# Patient Record
Sex: Male | Born: 1935
Health system: Southern US, Community
[De-identification: ages and names within clinical notes are randomized; demographics above are authoritative.]

## PROBLEM LIST (undated history)

## (undated) DIAGNOSIS — I739 Peripheral vascular disease, unspecified: Secondary | ICD-10-CM

## (undated) DIAGNOSIS — Z9109 Other allergy status, other than to drugs and biological substances: Secondary | ICD-10-CM

## (undated) DIAGNOSIS — Z951 Presence of aortocoronary bypass graft: Secondary | ICD-10-CM

## (undated) DIAGNOSIS — K222 Esophageal obstruction: Secondary | ICD-10-CM

## (undated) DIAGNOSIS — E785 Hyperlipidemia, unspecified: Secondary | ICD-10-CM

## (undated) DIAGNOSIS — H269 Unspecified cataract: Secondary | ICD-10-CM

## (undated) DIAGNOSIS — I2510 Atherosclerotic heart disease of native coronary artery without angina pectoris: Secondary | ICD-10-CM

## (undated) DIAGNOSIS — I498 Other specified cardiac arrhythmias: Secondary | ICD-10-CM

## (undated) DIAGNOSIS — E039 Hypothyroidism, unspecified: Secondary | ICD-10-CM

## (undated) DIAGNOSIS — G8929 Other chronic pain: Secondary | ICD-10-CM

## (undated) DIAGNOSIS — M1990 Unspecified osteoarthritis, unspecified site: Secondary | ICD-10-CM

## (undated) DIAGNOSIS — N189 Chronic kidney disease, unspecified: Secondary | ICD-10-CM

## (undated) DIAGNOSIS — I4892 Unspecified atrial flutter: Secondary | ICD-10-CM

## (undated) DIAGNOSIS — D509 Iron deficiency anemia, unspecified: Secondary | ICD-10-CM

## (undated) DIAGNOSIS — R972 Elevated prostate specific antigen [PSA]: Secondary | ICD-10-CM

## (undated) DIAGNOSIS — J439 Emphysema, unspecified: Secondary | ICD-10-CM

## (undated) DIAGNOSIS — N259 Disorder resulting from impaired renal tubular function, unspecified: Secondary | ICD-10-CM

## (undated) DIAGNOSIS — Z8719 Personal history of other diseases of the digestive system: Secondary | ICD-10-CM

## (undated) DIAGNOSIS — I10 Essential (primary) hypertension: Secondary | ICD-10-CM

## (undated) DIAGNOSIS — F419 Anxiety disorder, unspecified: Secondary | ICD-10-CM

## (undated) DIAGNOSIS — M549 Dorsalgia, unspecified: Secondary | ICD-10-CM

## (undated) DIAGNOSIS — Z86010 Personal history of colonic polyps: Secondary | ICD-10-CM

## (undated) DIAGNOSIS — Z87442 Personal history of urinary calculi: Secondary | ICD-10-CM

## (undated) DIAGNOSIS — I4891 Unspecified atrial fibrillation: Secondary | ICD-10-CM

## (undated) DIAGNOSIS — M109 Gout, unspecified: Secondary | ICD-10-CM

## (undated) DIAGNOSIS — I219 Acute myocardial infarction, unspecified: Secondary | ICD-10-CM

## (undated) DIAGNOSIS — I714 Abdominal aortic aneurysm, without rupture: Secondary | ICD-10-CM

## (undated) DIAGNOSIS — N529 Male erectile dysfunction, unspecified: Secondary | ICD-10-CM

## (undated) DIAGNOSIS — I200 Unstable angina: Secondary | ICD-10-CM

## (undated) DIAGNOSIS — J449 Chronic obstructive pulmonary disease, unspecified: Secondary | ICD-10-CM

## (undated) DIAGNOSIS — Z9289 Personal history of other medical treatment: Secondary | ICD-10-CM

## (undated) DIAGNOSIS — K219 Gastro-esophageal reflux disease without esophagitis: Secondary | ICD-10-CM

## (undated) DIAGNOSIS — K5730 Diverticulosis of large intestine without perforation or abscess without bleeding: Secondary | ICD-10-CM

## (undated) HISTORY — DX: Iron deficiency anemia, unspecified: D50.9

## (undated) HISTORY — PX: COLONOSCOPY W/ BIOPSIES AND POLYPECTOMY: SHX1376

## (undated) HISTORY — DX: Peripheral vascular disease, unspecified: I73.9

## (undated) HISTORY — DX: Atherosclerotic heart disease of native coronary artery without angina pectoris: I25.10

## (undated) HISTORY — DX: Unspecified cataract: H26.9

## (undated) HISTORY — DX: Diverticulosis of large intestine without perforation or abscess without bleeding: K57.30

## (undated) HISTORY — DX: Unspecified atrial fibrillation: I48.91

## (undated) HISTORY — DX: Unspecified atrial flutter: I48.92

## (undated) HISTORY — DX: Acute myocardial infarction, unspecified: I21.9

## (undated) HISTORY — DX: Elevated prostate specific antigen (PSA): R97.20

## (undated) HISTORY — DX: Personal history of other diseases of the digestive system: Z87.19

## (undated) HISTORY — DX: Chronic kidney disease, unspecified: N18.9

## (undated) HISTORY — DX: Abdominal aortic aneurysm, without rupture: I71.4

## (undated) HISTORY — DX: Gastro-esophageal reflux disease without esophagitis: K21.9

## (undated) HISTORY — DX: Disorder resulting from impaired renal tubular function, unspecified: N25.9

## (undated) HISTORY — DX: Other specified cardiac arrhythmias: I49.8

## (undated) HISTORY — DX: Male erectile dysfunction, unspecified: N52.9

## (undated) HISTORY — DX: Presence of aortocoronary bypass graft: Z95.1

## (undated) HISTORY — PX: MULTIPLE TOOTH EXTRACTIONS: SHX2053

## (undated) HISTORY — PX: ESOPHAGOGASTRODUODENOSCOPY (EGD) WITH ESOPHAGEAL DILATION: SHX5812

## (undated) HISTORY — DX: Gout, unspecified: M10.9

## (undated) HISTORY — DX: Hyperlipidemia, unspecified: E78.5

## (undated) HISTORY — DX: Personal history of colonic polyps: Z86.010

## (undated) HISTORY — DX: Essential (primary) hypertension: I10

---
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
## 1998-06-12 ENCOUNTER — Ambulatory Visit (HOSPITAL_COMMUNITY): Admission: RE | Admit: 1998-06-12 | Discharge: 1998-06-12 | Payer: Self-pay | Admitting: Family Medicine

## 1999-10-09 ENCOUNTER — Ambulatory Visit (HOSPITAL_COMMUNITY): Admission: RE | Admit: 1999-10-09 | Discharge: 1999-10-09 | Payer: Self-pay | Admitting: Urology

## 1999-10-09 ENCOUNTER — Encounter: Payer: Self-pay | Admitting: Urology

## 1999-11-06 ENCOUNTER — Other Ambulatory Visit: Admission: RE | Admit: 1999-11-06 | Discharge: 1999-11-06 | Payer: Self-pay | Admitting: Gastroenterology

## 1999-11-06 ENCOUNTER — Encounter (INDEPENDENT_AMBULATORY_CARE_PROVIDER_SITE_OTHER): Payer: Self-pay | Admitting: Specialist

## 2000-05-29 ENCOUNTER — Emergency Department (HOSPITAL_COMMUNITY): Admission: EM | Admit: 2000-05-29 | Discharge: 2000-05-29 | Payer: Self-pay | Admitting: Emergency Medicine

## 2000-06-22 ENCOUNTER — Encounter: Admission: RE | Admit: 2000-06-22 | Discharge: 2000-06-22 | Payer: Self-pay | Admitting: Family Medicine

## 2000-06-22 ENCOUNTER — Encounter: Payer: Self-pay | Admitting: Family Medicine

## 2001-01-24 ENCOUNTER — Encounter: Admission: RE | Admit: 2001-01-24 | Discharge: 2001-01-24 | Payer: Self-pay | Admitting: Family Medicine

## 2001-01-24 ENCOUNTER — Encounter: Payer: Self-pay | Admitting: Family Medicine

## 2002-05-18 HISTORY — PX: ATRIAL FIBRILLATION ABLATION: EP1191

## 2002-05-18 HISTORY — PX: CORONARY ARTERY BYPASS GRAFT: SHX141

## 2003-03-05 ENCOUNTER — Inpatient Hospital Stay (HOSPITAL_COMMUNITY): Admission: AD | Admit: 2003-03-05 | Discharge: 2003-03-15 | Payer: Self-pay | Admitting: *Deleted

## 2003-03-05 ENCOUNTER — Encounter: Payer: Self-pay | Admitting: *Deleted

## 2003-03-07 ENCOUNTER — Encounter: Payer: Self-pay | Admitting: Thoracic Surgery (Cardiothoracic Vascular Surgery)

## 2003-03-08 ENCOUNTER — Encounter: Payer: Self-pay | Admitting: Thoracic Surgery (Cardiothoracic Vascular Surgery)

## 2003-03-09 ENCOUNTER — Encounter: Payer: Self-pay | Admitting: Thoracic Surgery (Cardiothoracic Vascular Surgery)

## 2003-03-10 ENCOUNTER — Encounter: Payer: Self-pay | Admitting: Thoracic Surgery (Cardiothoracic Vascular Surgery)

## 2003-03-20 ENCOUNTER — Inpatient Hospital Stay (HOSPITAL_COMMUNITY): Admission: EM | Admit: 2003-03-20 | Discharge: 2003-03-24 | Payer: Self-pay | Admitting: Emergency Medicine

## 2003-04-30 ENCOUNTER — Encounter
Admission: RE | Admit: 2003-04-30 | Discharge: 2003-04-30 | Payer: Self-pay | Admitting: Thoracic Surgery (Cardiothoracic Vascular Surgery)

## 2003-05-07 ENCOUNTER — Encounter (HOSPITAL_COMMUNITY): Admission: RE | Admit: 2003-05-07 | Discharge: 2003-08-05 | Payer: Self-pay | Admitting: *Deleted

## 2003-08-06 ENCOUNTER — Encounter (HOSPITAL_COMMUNITY): Admission: RE | Admit: 2003-08-06 | Discharge: 2003-08-17 | Payer: Self-pay | Admitting: *Deleted

## 2003-08-21 ENCOUNTER — Encounter (HOSPITAL_COMMUNITY): Admission: RE | Admit: 2003-08-21 | Discharge: 2003-11-19 | Payer: Self-pay | Admitting: *Deleted

## 2004-04-14 ENCOUNTER — Ambulatory Visit: Payer: Self-pay | Admitting: Internal Medicine

## 2004-05-14 ENCOUNTER — Ambulatory Visit: Payer: Self-pay | Admitting: Internal Medicine

## 2004-06-11 ENCOUNTER — Ambulatory Visit: Payer: Self-pay | Admitting: Cardiology

## 2004-07-03 ENCOUNTER — Ambulatory Visit: Payer: Self-pay | Admitting: *Deleted

## 2004-07-31 ENCOUNTER — Ambulatory Visit: Payer: Self-pay | Admitting: Cardiology

## 2004-08-21 ENCOUNTER — Ambulatory Visit: Payer: Self-pay | Admitting: Internal Medicine

## 2004-09-04 ENCOUNTER — Ambulatory Visit: Payer: Self-pay | Admitting: Cardiology

## 2004-09-17 ENCOUNTER — Ambulatory Visit: Payer: Self-pay | Admitting: *Deleted

## 2004-10-15 ENCOUNTER — Ambulatory Visit: Payer: Self-pay | Admitting: Cardiology

## 2004-11-05 ENCOUNTER — Ambulatory Visit: Payer: Self-pay | Admitting: *Deleted

## 2004-12-10 ENCOUNTER — Ambulatory Visit: Payer: Self-pay | Admitting: Cardiology

## 2004-12-10 ENCOUNTER — Ambulatory Visit: Payer: Self-pay | Admitting: Internal Medicine

## 2004-12-16 ENCOUNTER — Ambulatory Visit: Payer: Self-pay | Admitting: *Deleted

## 2004-12-17 ENCOUNTER — Ambulatory Visit: Payer: Self-pay | Admitting: Internal Medicine

## 2005-01-08 ENCOUNTER — Ambulatory Visit: Payer: Self-pay | Admitting: Cardiology

## 2005-01-29 ENCOUNTER — Ambulatory Visit: Payer: Self-pay | Admitting: Internal Medicine

## 2005-02-26 ENCOUNTER — Ambulatory Visit: Payer: Self-pay | Admitting: Cardiology

## 2005-03-26 ENCOUNTER — Ambulatory Visit: Payer: Self-pay | Admitting: Cardiology

## 2005-04-16 ENCOUNTER — Ambulatory Visit: Payer: Self-pay | Admitting: Cardiology

## 2005-05-07 ENCOUNTER — Ambulatory Visit: Payer: Self-pay | Admitting: Cardiology

## 2005-05-13 ENCOUNTER — Ambulatory Visit: Payer: Self-pay | Admitting: *Deleted

## 2005-05-28 ENCOUNTER — Ambulatory Visit: Payer: Self-pay | Admitting: Cardiology

## 2005-06-10 ENCOUNTER — Ambulatory Visit: Payer: Self-pay | Admitting: Internal Medicine

## 2005-06-25 ENCOUNTER — Ambulatory Visit: Payer: Self-pay | Admitting: *Deleted

## 2005-07-23 ENCOUNTER — Ambulatory Visit: Payer: Self-pay | Admitting: Cardiology

## 2005-08-26 ENCOUNTER — Ambulatory Visit: Payer: Self-pay | Admitting: *Deleted

## 2005-09-23 ENCOUNTER — Ambulatory Visit: Payer: Self-pay | Admitting: Cardiology

## 2005-10-02 ENCOUNTER — Ambulatory Visit: Payer: Self-pay | Admitting: Gastroenterology

## 2005-10-07 ENCOUNTER — Ambulatory Visit: Payer: Self-pay | Admitting: Gastroenterology

## 2005-10-07 ENCOUNTER — Encounter (INDEPENDENT_AMBULATORY_CARE_PROVIDER_SITE_OTHER): Payer: Self-pay | Admitting: *Deleted

## 2005-10-08 ENCOUNTER — Ambulatory Visit: Payer: Self-pay | Admitting: Gastroenterology

## 2005-10-08 ENCOUNTER — Ambulatory Visit: Payer: Self-pay | Admitting: Internal Medicine

## 2005-11-05 ENCOUNTER — Ambulatory Visit: Payer: Self-pay | Admitting: Gastroenterology

## 2005-11-11 ENCOUNTER — Ambulatory Visit: Payer: Self-pay | Admitting: Gastroenterology

## 2005-11-12 ENCOUNTER — Ambulatory Visit: Payer: Self-pay | Admitting: Gastroenterology

## 2005-11-27 ENCOUNTER — Ambulatory Visit (HOSPITAL_COMMUNITY): Admission: RE | Admit: 2005-11-27 | Discharge: 2005-11-27 | Payer: Self-pay | Admitting: Gastroenterology

## 2005-12-11 ENCOUNTER — Ambulatory Visit: Payer: Self-pay | Admitting: Internal Medicine

## 2005-12-15 ENCOUNTER — Ambulatory Visit: Payer: Self-pay | Admitting: Gastroenterology

## 2006-03-26 ENCOUNTER — Ambulatory Visit: Payer: Self-pay | Admitting: *Deleted

## 2006-03-31 ENCOUNTER — Ambulatory Visit: Payer: Self-pay | Admitting: *Deleted

## 2006-03-31 ENCOUNTER — Ambulatory Visit: Payer: Self-pay | Admitting: Internal Medicine

## 2006-04-01 ENCOUNTER — Ambulatory Visit: Payer: Self-pay | Admitting: *Deleted

## 2007-01-13 ENCOUNTER — Observation Stay (HOSPITAL_COMMUNITY): Admission: EM | Admit: 2007-01-13 | Discharge: 2007-01-14 | Payer: Self-pay | Admitting: Emergency Medicine

## 2007-01-13 ENCOUNTER — Ambulatory Visit: Payer: Self-pay | Admitting: Internal Medicine

## 2007-01-24 ENCOUNTER — Ambulatory Visit: Payer: Self-pay

## 2007-01-31 ENCOUNTER — Ambulatory Visit: Payer: Self-pay | Admitting: Internal Medicine

## 2007-03-28 ENCOUNTER — Encounter: Admission: RE | Admit: 2007-03-28 | Discharge: 2007-03-28 | Payer: Self-pay | Admitting: Family Medicine

## 2008-01-05 ENCOUNTER — Encounter: Payer: Self-pay | Admitting: Internal Medicine

## 2008-01-05 LAB — CONVERTED CEMR LAB: PSA: 3.85 ng/mL

## 2008-02-16 LAB — CONVERTED CEMR LAB: PSA: 2.14 ng/mL

## 2008-02-17 ENCOUNTER — Ambulatory Visit: Payer: Self-pay | Admitting: Internal Medicine

## 2008-04-06 DIAGNOSIS — R0789 Other chest pain: Secondary | ICD-10-CM

## 2008-04-11 ENCOUNTER — Encounter: Payer: Self-pay | Admitting: Nurse Practitioner

## 2008-04-18 ENCOUNTER — Telehealth: Payer: Self-pay | Admitting: Gastroenterology

## 2008-04-19 ENCOUNTER — Ambulatory Visit: Payer: Self-pay | Admitting: Gastroenterology

## 2008-04-19 DIAGNOSIS — Z9289 Personal history of other medical treatment: Secondary | ICD-10-CM

## 2008-04-19 DIAGNOSIS — Z8719 Personal history of other diseases of the digestive system: Secondary | ICD-10-CM

## 2008-04-19 DIAGNOSIS — I480 Paroxysmal atrial fibrillation: Secondary | ICD-10-CM

## 2008-04-19 DIAGNOSIS — I4891 Unspecified atrial fibrillation: Secondary | ICD-10-CM

## 2008-04-19 HISTORY — DX: Unspecified atrial fibrillation: I48.91

## 2008-04-19 HISTORY — DX: Personal history of other diseases of the digestive system: Z87.19

## 2008-04-19 HISTORY — DX: Personal history of other medical treatment: Z92.89

## 2008-04-23 ENCOUNTER — Telehealth: Payer: Self-pay | Admitting: Nurse Practitioner

## 2008-04-24 ENCOUNTER — Telehealth: Payer: Self-pay | Admitting: Gastroenterology

## 2008-04-25 ENCOUNTER — Ambulatory Visit: Payer: Self-pay | Admitting: Gastroenterology

## 2008-04-30 ENCOUNTER — Ambulatory Visit: Payer: Self-pay | Admitting: Gastroenterology

## 2008-05-03 ENCOUNTER — Ambulatory Visit: Payer: Self-pay | Admitting: Gastroenterology

## 2008-05-03 DIAGNOSIS — R1013 Epigastric pain: Secondary | ICD-10-CM

## 2008-05-04 ENCOUNTER — Telehealth: Payer: Self-pay | Admitting: Gastroenterology

## 2008-05-16 ENCOUNTER — Other Ambulatory Visit: Admission: RE | Admit: 2008-05-16 | Discharge: 2008-05-16 | Payer: Self-pay | Admitting: Otolaryngology

## 2008-05-17 ENCOUNTER — Ambulatory Visit (HOSPITAL_COMMUNITY): Admission: RE | Admit: 2008-05-17 | Discharge: 2008-05-17 | Payer: Self-pay | Admitting: Gastroenterology

## 2008-05-25 ENCOUNTER — Telehealth: Payer: Self-pay | Admitting: Gastroenterology

## 2008-09-17 ENCOUNTER — Telehealth: Payer: Self-pay | Admitting: Internal Medicine

## 2008-09-19 ENCOUNTER — Ambulatory Visit: Payer: Self-pay | Admitting: Internal Medicine

## 2008-09-19 DIAGNOSIS — N183 Chronic kidney disease, stage 3 unspecified: Secondary | ICD-10-CM | POA: Insufficient documentation

## 2008-09-19 DIAGNOSIS — Z860100 Personal history of colon polyps, unspecified: Secondary | ICD-10-CM | POA: Insufficient documentation

## 2008-09-19 DIAGNOSIS — K219 Gastro-esophageal reflux disease without esophagitis: Secondary | ICD-10-CM

## 2008-09-19 DIAGNOSIS — D509 Iron deficiency anemia, unspecified: Secondary | ICD-10-CM

## 2008-09-19 DIAGNOSIS — I714 Abdominal aortic aneurysm, without rupture, unspecified: Secondary | ICD-10-CM

## 2008-09-19 DIAGNOSIS — M109 Gout, unspecified: Secondary | ICD-10-CM

## 2008-09-19 DIAGNOSIS — M545 Low back pain, unspecified: Secondary | ICD-10-CM | POA: Insufficient documentation

## 2008-09-19 DIAGNOSIS — I10 Essential (primary) hypertension: Secondary | ICD-10-CM | POA: Insufficient documentation

## 2008-09-19 DIAGNOSIS — K5792 Diverticulitis of intestine, part unspecified, without perforation or abscess without bleeding: Secondary | ICD-10-CM | POA: Insufficient documentation

## 2008-09-19 DIAGNOSIS — I739 Peripheral vascular disease, unspecified: Secondary | ICD-10-CM | POA: Insufficient documentation

## 2008-09-19 DIAGNOSIS — E785 Hyperlipidemia, unspecified: Secondary | ICD-10-CM

## 2008-09-19 DIAGNOSIS — N189 Chronic kidney disease, unspecified: Secondary | ICD-10-CM

## 2008-09-19 DIAGNOSIS — N259 Disorder resulting from impaired renal tubular function, unspecified: Secondary | ICD-10-CM

## 2008-09-19 DIAGNOSIS — R972 Elevated prostate specific antigen [PSA]: Secondary | ICD-10-CM

## 2008-09-19 DIAGNOSIS — K5730 Diverticulosis of large intestine without perforation or abscess without bleeding: Secondary | ICD-10-CM

## 2008-09-19 HISTORY — DX: Abdominal aortic aneurysm, without rupture: I71.4

## 2008-09-19 HISTORY — DX: Chronic kidney disease, unspecified: N18.9

## 2008-09-19 HISTORY — DX: Essential (primary) hypertension: I10

## 2008-09-19 HISTORY — DX: Personal history of colon polyps, unspecified: Z86.0100

## 2008-09-19 HISTORY — DX: Peripheral vascular disease, unspecified: I73.9

## 2008-09-19 HISTORY — DX: Hyperlipidemia, unspecified: E78.5

## 2008-09-19 HISTORY — DX: Iron deficiency anemia, unspecified: D50.9

## 2008-09-19 HISTORY — DX: Diverticulosis of large intestine without perforation or abscess without bleeding: K57.30

## 2008-09-19 HISTORY — DX: Abdominal aortic aneurysm, without rupture, unspecified: I71.40

## 2008-09-19 HISTORY — DX: Elevated prostate specific antigen (PSA): R97.20

## 2008-09-19 HISTORY — DX: Disorder resulting from impaired renal tubular function, unspecified: N25.9

## 2008-09-19 HISTORY — DX: Gout, unspecified: M10.9

## 2008-09-19 HISTORY — DX: Gastro-esophageal reflux disease without esophagitis: K21.9

## 2008-09-19 HISTORY — DX: Personal history of colonic polyps: Z86.010

## 2008-09-19 LAB — CONVERTED CEMR LAB
CO2: 30 meq/L (ref 19–32)
Calcium: 9.3 mg/dL (ref 8.4–10.5)
Chloride: 110 meq/L (ref 96–112)
HDL: 36.4 mg/dL — ABNORMAL LOW (ref >39.00)
LDL Cholesterol: 90 mg/dL (ref 0–99)
Sodium: 143 meq/L (ref 135–145)
Total CHOL/HDL Ratio: 5

## 2008-09-26 ENCOUNTER — Encounter: Payer: Self-pay | Admitting: Internal Medicine

## 2008-09-26 ENCOUNTER — Ambulatory Visit: Payer: Self-pay

## 2008-12-05 DIAGNOSIS — E079 Disorder of thyroid, unspecified: Secondary | ICD-10-CM | POA: Insufficient documentation

## 2008-12-05 DIAGNOSIS — I2510 Atherosclerotic heart disease of native coronary artery without angina pectoris: Secondary | ICD-10-CM

## 2008-12-05 DIAGNOSIS — E039 Hypothyroidism, unspecified: Secondary | ICD-10-CM

## 2008-12-05 DIAGNOSIS — I4892 Unspecified atrial flutter: Secondary | ICD-10-CM

## 2008-12-05 DIAGNOSIS — I498 Other specified cardiac arrhythmias: Secondary | ICD-10-CM

## 2008-12-05 DIAGNOSIS — I495 Sick sinus syndrome: Secondary | ICD-10-CM | POA: Insufficient documentation

## 2008-12-05 DIAGNOSIS — Z951 Presence of aortocoronary bypass graft: Secondary | ICD-10-CM

## 2008-12-05 HISTORY — DX: Hypothyroidism, unspecified: E03.9

## 2008-12-05 HISTORY — DX: Presence of aortocoronary bypass graft: Z95.1

## 2008-12-05 HISTORY — DX: Unspecified atrial flutter: I48.92

## 2008-12-05 HISTORY — DX: Atherosclerotic heart disease of native coronary artery without angina pectoris: I25.10

## 2008-12-05 HISTORY — DX: Other specified cardiac arrhythmias: I49.8

## 2008-12-06 ENCOUNTER — Ambulatory Visit: Payer: Self-pay | Admitting: Internal Medicine

## 2009-01-11 ENCOUNTER — Ambulatory Visit: Payer: Self-pay | Admitting: Cardiovascular Disease

## 2009-01-11 ENCOUNTER — Ambulatory Visit: Payer: Self-pay | Admitting: Internal Medicine

## 2009-01-11 DIAGNOSIS — Z87442 Personal history of urinary calculi: Secondary | ICD-10-CM | POA: Insufficient documentation

## 2009-01-11 DIAGNOSIS — R109 Unspecified abdominal pain: Secondary | ICD-10-CM

## 2009-01-11 LAB — CONVERTED CEMR LAB
ALT: 20 units/L (ref 0–53)
AST: 21 units/L (ref 0–37)
Albumin: 3.9 g/dL (ref 3.5–5.2)
BUN: 16 mg/dL (ref 6–23)
Basophils Relative: 0.3 % (ref 0.0–3.0)
Bilirubin Urine: NEGATIVE
Chloride: 108 meq/L (ref 96–112)
Eosinophils Relative: 2 % (ref 0.0–5.0)
HCT: 43.8 % (ref 39.0–52.0)
Hemoglobin, Urine: NEGATIVE
Hemoglobin: 14.8 g/dL (ref 13.0–17.0)
Lymphs Abs: 0.7 10*3/uL (ref 0.7–4.0)
MCV: 92.9 fL (ref 78.0–100.0)
Monocytes Absolute: 0.5 10*3/uL (ref 0.1–1.0)
Neutro Abs: 4.9 10*3/uL (ref 1.4–7.7)
Nitrite: NEGATIVE
Platelets: 174 10*3/uL (ref 150.0–400.0)
Potassium: 4.4 meq/L (ref 3.5–5.1)
Sodium: 140 meq/L (ref 135–145)
Total Protein: 7.1 g/dL (ref 6.0–8.3)
Urobilinogen, UA: 0.2 (ref 0.0–1.0)
WBC: 6.2 10*3/uL (ref 4.5–10.5)

## 2009-01-12 ENCOUNTER — Ambulatory Visit: Payer: Self-pay | Admitting: Internal Medicine

## 2009-01-12 ENCOUNTER — Telehealth: Payer: Self-pay | Admitting: Internal Medicine

## 2009-01-22 ENCOUNTER — Ambulatory Visit: Payer: Self-pay | Admitting: Internal Medicine

## 2009-01-22 LAB — CONVERTED CEMR LAB
Beta Globulin: 7.4 % — ABNORMAL HIGH (ref 4.7–7.2)
Gamma Globulin: 17.2 % (ref 11.1–18.8)

## 2009-01-24 ENCOUNTER — Telehealth: Payer: Self-pay | Admitting: Internal Medicine

## 2009-02-05 ENCOUNTER — Telehealth: Payer: Self-pay | Admitting: Internal Medicine

## 2009-03-11 ENCOUNTER — Ambulatory Visit: Payer: Self-pay | Admitting: Internal Medicine

## 2009-04-17 ENCOUNTER — Encounter: Payer: Self-pay | Admitting: Internal Medicine

## 2009-06-07 ENCOUNTER — Telehealth: Payer: Self-pay | Admitting: Internal Medicine

## 2009-06-25 ENCOUNTER — Ambulatory Visit: Payer: Self-pay | Admitting: Internal Medicine

## 2009-08-22 ENCOUNTER — Ambulatory Visit: Payer: Self-pay | Admitting: Internal Medicine

## 2009-09-16 ENCOUNTER — Encounter: Payer: Self-pay | Admitting: Internal Medicine

## 2009-09-18 ENCOUNTER — Telehealth (INDEPENDENT_AMBULATORY_CARE_PROVIDER_SITE_OTHER): Payer: Self-pay | Admitting: *Deleted

## 2009-10-08 ENCOUNTER — Encounter: Payer: Self-pay | Admitting: Internal Medicine

## 2009-10-21 ENCOUNTER — Ambulatory Visit: Payer: Self-pay

## 2009-10-21 ENCOUNTER — Encounter: Payer: Self-pay | Admitting: Internal Medicine

## 2009-12-10 ENCOUNTER — Ambulatory Visit: Payer: Self-pay | Admitting: Internal Medicine

## 2009-12-11 LAB — CONVERTED CEMR LAB
ALT: 12 units/L (ref 0–53)
AST: 15 units/L (ref 0–37)
BUN: 19 mg/dL (ref 6–23)
Basophils Relative: 0.6 % (ref 0.0–3.0)
Bilirubin, Direct: 0.2 mg/dL (ref 0.0–0.3)
Chloride: 103 meq/L (ref 96–112)
Creatinine, Ser: 1.4 mg/dL (ref 0.4–1.5)
Direct LDL: 114.5 mg/dL
Eosinophils Relative: 4.1 % (ref 0.0–5.0)
GFR calc non Af Amer: 53.2 mL/min (ref >60)
HCT: 41 % (ref 39.0–52.0)
Hemoglobin, Urine: NEGATIVE
Leukocytes, UA: NEGATIVE
MCV: 94.8 fL (ref 78.0–100.0)
Monocytes Absolute: 0.4 10*3/uL (ref 0.1–1.0)
Monocytes Relative: 8.9 % (ref 3.0–12.0)
Neutrophils Relative %: 62.5 % (ref 43.0–77.0)
Nitrite: NEGATIVE
PSA: 2.05 ng/mL (ref 0.10–4.00)
Platelets: 227 10*3/uL (ref 150.0–400.0)
Potassium: 4.9 meq/L (ref 3.5–5.1)
RBC: 4.33 M/uL (ref 4.22–5.81)
Specific Gravity, Urine: 1.015 (ref 1.000–1.030)
TSH: 1.6 microintl units/mL (ref 0.35–5.50)
Total Bilirubin: 0.7 mg/dL (ref 0.3–1.2)
Total CHOL/HDL Ratio: 5
Total Protein: 7.2 g/dL (ref 6.0–8.3)
Urobilinogen, UA: 0.2 (ref 0.0–1.0)
WBC: 4.3 10*3/uL — ABNORMAL LOW (ref 4.5–10.5)

## 2010-03-03 ENCOUNTER — Ambulatory Visit: Payer: Self-pay | Admitting: Internal Medicine

## 2010-03-03 ENCOUNTER — Encounter: Payer: Self-pay | Admitting: Internal Medicine

## 2010-03-03 DIAGNOSIS — R079 Chest pain, unspecified: Secondary | ICD-10-CM

## 2010-03-03 DIAGNOSIS — M549 Dorsalgia, unspecified: Secondary | ICD-10-CM | POA: Insufficient documentation

## 2010-06-02 ENCOUNTER — Observation Stay (HOSPITAL_COMMUNITY)
Admission: EM | Admit: 2010-06-02 | Discharge: 2010-06-03 | Payer: Self-pay | Source: Home / Self Care | Attending: Internal Medicine | Admitting: Internal Medicine

## 2010-06-03 ENCOUNTER — Encounter (INDEPENDENT_AMBULATORY_CARE_PROVIDER_SITE_OTHER): Payer: Self-pay | Admitting: Internal Medicine

## 2010-06-04 LAB — BASIC METABOLIC PANEL
BUN: 20 mg/dL (ref 6–23)
CO2: 24 mEq/L (ref 19–32)
Calcium: 9.2 mg/dL (ref 8.4–10.5)
Chloride: 102 mEq/L (ref 96–112)
Creatinine, Ser: 1.51 mg/dL — ABNORMAL HIGH (ref 0.4–1.5)
GFR calc Af Amer: 55 mL/min — ABNORMAL LOW (ref >60)
GFR calc non Af Amer: 46 mL/min — ABNORMAL LOW (ref >60)
Glucose, Bld: 112 mg/dL — ABNORMAL HIGH (ref 70–99)
Potassium: 3.7 mEq/L (ref 3.5–5.1)
Sodium: 135 mEq/L (ref 135–145)

## 2010-06-04 LAB — COMPREHENSIVE METABOLIC PANEL
ALT: 13 U/L (ref 0–53)
AST: 16 U/L (ref 0–37)
Albumin: 3.4 g/dL — ABNORMAL LOW (ref 3.5–5.2)
Alkaline Phosphatase: 87 U/L (ref 39–117)
BUN: 21 mg/dL (ref 6–23)
CO2: 27 mEq/L (ref 19–32)
Calcium: 9.1 mg/dL (ref 8.4–10.5)
Chloride: 106 mEq/L (ref 96–112)
Creatinine, Ser: 1.75 mg/dL — ABNORMAL HIGH (ref 0.4–1.5)
GFR calc Af Amer: 46 mL/min — ABNORMAL LOW (ref >60)
GFR calc non Af Amer: 38 mL/min — ABNORMAL LOW (ref >60)
Glucose, Bld: 107 mg/dL — ABNORMAL HIGH (ref 70–99)
Potassium: 4.3 mEq/L (ref 3.5–5.1)
Sodium: 139 mEq/L (ref 135–145)
Total Bilirubin: 0.7 mg/dL (ref 0.3–1.2)
Total Protein: 6.4 g/dL (ref 6.0–8.3)

## 2010-06-04 LAB — DIFFERENTIAL
Basophils Absolute: 0 10*3/uL (ref 0.0–0.1)
Basophils Relative: 0 % (ref 0–1)
Eosinophils Absolute: 0.3 10*3/uL (ref 0.0–0.7)
Eosinophils Relative: 6 % — ABNORMAL HIGH (ref 0–5)
Lymphocytes Relative: 29 % (ref 12–46)
Lymphs Abs: 1.4 10*3/uL (ref 0.7–4.0)
Monocytes Absolute: 0.5 10*3/uL (ref 0.1–1.0)
Monocytes Relative: 10 % (ref 3–12)
Neutro Abs: 2.6 10*3/uL (ref 1.7–7.7)
Neutrophils Relative %: 55 % (ref 43–77)

## 2010-06-04 LAB — TSH: TSH: 3.698 u[IU]/mL (ref 0.350–4.500)

## 2010-06-04 LAB — CARDIAC PANEL(CRET KIN+CKTOT+MB+TROPI)
CK, MB: 1.4 ng/mL (ref 0.3–4.0)
CK, MB: 1.5 ng/mL (ref 0.3–4.0)
CK, MB: 1.2 ng/mL (ref 0.3–4.0)
Relative Index: INVALID (ref 0.0–2.5)
Relative Index: INVALID (ref 0.0–2.5)
Relative Index: INVALID (ref 0.0–2.5)
Total CK: 66 U/L (ref 7–232)
Total CK: 66 U/L (ref 7–232)
Total CK: 64 U/L (ref 7–232)
Troponin I: <0.01 ng/mL (ref 0.00–0.06)
Troponin I: <0.01 ng/mL (ref 0.00–0.06)
Troponin I: 0.01 ng/mL (ref 0.00–0.06)

## 2010-06-04 LAB — CBC
HCT: 39.3 % (ref 39.0–52.0)
HCT: 39.5 % (ref 39.0–52.0)
Hemoglobin: 12.8 g/dL — ABNORMAL LOW (ref 13.0–17.0)
Hemoglobin: 13.1 g/dL (ref 13.0–17.0)
MCH: 30.1 pg (ref 26.0–34.0)
MCH: 30.8 pg (ref 26.0–34.0)
MCHC: 32.6 g/dL (ref 30.0–36.0)
MCHC: 33.2 g/dL (ref 30.0–36.0)
MCV: 92.5 fL (ref 78.0–100.0)
MCV: 92.9 fL (ref 78.0–100.0)
Platelets: 181 10*3/uL (ref 150–400)
Platelets: 180 10*3/uL (ref 150–400)
RBC: 4.25 MIL/uL (ref 4.22–5.81)
RBC: 4.25 MIL/uL (ref 4.22–5.81)
RDW: 13.5 % (ref 11.5–15.5)
RDW: 13.6 % (ref 11.5–15.5)
WBC: 4.7 10*3/uL (ref 4.0–10.5)
WBC: 5.5 10*3/uL (ref 4.0–10.5)

## 2010-06-04 LAB — MAGNESIUM: Magnesium: 2.1 mg/dL (ref 1.5–2.5)

## 2010-06-04 LAB — T4, FREE: Free T4: 1.42 ng/dL (ref 0.80–1.80)

## 2010-06-04 LAB — CK TOTAL AND CKMB (NOT AT ARMC)
CK, MB: 1.3 ng/mL (ref 0.3–4.0)
Relative Index: INVALID (ref 0.0–2.5)
Total CK: 87 U/L (ref 7–232)

## 2010-06-04 LAB — T3, FREE: T3, Free: 2.3 pg/mL (ref 2.3–4.2)

## 2010-06-04 LAB — TROPONIN I: Troponin I: 0.01 ng/mL (ref 0.00–0.06)

## 2010-06-07 ENCOUNTER — Encounter: Payer: Self-pay | Admitting: Thoracic Surgery (Cardiothoracic Vascular Surgery)

## 2010-06-08 ENCOUNTER — Encounter: Payer: Self-pay | Admitting: Orthopedic Surgery

## 2010-06-09 ENCOUNTER — Encounter: Payer: Self-pay | Admitting: Internal Medicine

## 2010-06-11 ENCOUNTER — Telehealth (INDEPENDENT_AMBULATORY_CARE_PROVIDER_SITE_OTHER): Payer: Self-pay | Admitting: *Deleted

## 2010-06-12 ENCOUNTER — Ambulatory Visit: Admission: RE | Admit: 2010-06-12 | Discharge: 2010-06-12 | Payer: Self-pay | Source: Home / Self Care

## 2010-06-12 ENCOUNTER — Encounter: Payer: Self-pay | Admitting: *Deleted

## 2010-06-12 ENCOUNTER — Encounter: Payer: Self-pay | Admitting: Cardiology

## 2010-06-12 ENCOUNTER — Encounter (HOSPITAL_COMMUNITY)
Admission: RE | Admit: 2010-06-12 | Discharge: 2010-06-17 | Payer: Self-pay | Source: Home / Self Care | Attending: Internal Medicine | Admitting: Internal Medicine

## 2010-06-12 NOTE — Consult Note (Addendum)
NAME:  Omar Benson, Omar Benson NO.:  [id number]  MEDICAL RECORD NO.:  [id number]          PATIENT TYPE:  INP  LOCATION:  2030                         FACILITY:  [facility]  PHYSICIAN:  Colleen Can. Deborah Chalk, M.D.DATE OF BIRTH:  18-Dec-1934  DATE OF CONSULTATION:  06/02/2010 DATE OF DISCHARGE:                                CONSULTATION   PRIMARY CARDIOLOGIST:  [name]. [name], MD  PRIMARY CARE PHYSICIAN:  Corwin Levins, MD  REFERRING PHYSICIAN:  Talmage Nap, MD  REASON FOR CONSULTATION:  Chest pain.  PAST MEDICAL HISTORY: 1. Coronary artery disease status post coronary artery bypass grafting     in 2004:  LIMA to LAD, reverse SVG to ramus intermediate, SVG to OM-     2, sequential SVG to RCA and posterolateral branch.     a.     Status post stress Myoview perfusion study in September 2008      that showed normal perfusion with preserved LV function. 2. Sinus bradycardia. 3. Atrial flutter status post radiofrequency catheter ablation in     2004. 4. History of atrial fibrillation, on low-dose amiodarone.     a.     Coumadin discontinued in 2007 secondary to anemia. 5. Gastroesophageal reflux disease. 6. Dyslipidemia. 7. Hypertension. 8. Hypothyroidism. 9. Chronic renal insufficiency. 10.Chronic back pain.  HISTORY OF PRESENT ILLNESS:  This is a 76 year old gentleman with the above-stated problem list who states he has been in his usual state of health and doing well from a cardiac standpoint until approximately 5 days ago when he had chest pain.  He describes the pain as a soreness and mild pressure type on either side of his sternum.  This was relieved by aspirin and Myoflex.  The patient had no further pain until approximately 3:00 a.m. this morning when he had a similar episode that awoke him from sleep.  He took aspirin without relief.  With pain being constant, he reported to the emergency department for further evaluation.  The patient was given  nitroglycerin, morphine, and Flexeril without relief.  His chest pain has now been constant for approximately 8 hours.  His chest wall is tender to palpation.  He denies any associated symptoms of diaphoresis, nausea/vomiting, or shortness of breath.  He also denies any fever/chills, change in bowel habits, or weakness.  In the emergency department, the patient's initial set of cardiac markers are negative.  His EKG is without acute changes.  Of note, the patient also walks 2-1/2 miles a day.  He was able to complete this activity 2 days ago without any trouble.  Cardiology has been consulted for further recommendations.  SOCIAL HISTORY:  The patient lives in [place] with his wife.  He is a self-employed Camera operator.  He denies any tobacco or illicit drug use.  He occasionally drinks alcohol.  He tries to exercise daily on his treadmill.  FAMILY HISTORY:  Noncontributory for early coronary artery disease.  His mother had multiple myeloma.  His father and siblings have diabetes mellitus and hypertension.  ALLERGIES: 1. TETANUS TOXOID. 2. TETRACYCLINE. 3. PHENERGAN. 4. Intolerance to STATINS causing myalgia.  HOME MEDICATIONS: 1. Amiodarone 100 mg daily. 2. Allopurinol 300 mg daily. 3. Aspirin 81 mg daily. 4. Prilosec 20 mg daily. 5. Synthroid 100 mcg daily. 6. Over-the-counter magnesium oxide daily. 7. Over-the-counter vitamin B12 daily. 8. Over-the-counter folic acid daily. 9. Over-the-counter potassium chloride daily. 10.Over-the-counter vitamin E daily. 11.Over-the-counter vitamin C daily. 12.Over-the-counter multivitamin daily.  REVIEW OF SYSTEMS:  All pertinent positives and negatives as stated in HPI.  All other systems have been reviewed and are negative.  CODE STATUS:  Full.  PHYSICAL EXAMINATION:  VITAL SIGNS:  Temperature 98.6, pulse 51, respirations 18, blood pressure 139/75, and O2 saturation 100% on 2 liters. GENERAL:  This is an elderly  gentleman who is finishing up breakfast prior to interview.  He is in no acute distress. HEENT:  Normal. NECK:  Neck is supple without bruit or JVD. HEART:  Bradycardic with S1 and S2.  No murmur, rub, or gallop noted. PMI is normal.  Pulses are 2+ and equal bilaterally.  Left-sided chest is tender to palpation. LUNGS:  Clear to auscultation bilaterally without wheezes, rales, or rhonchi. ABDOMEN:  Soft and nontender.  Positive bowel sounds x4.  Negative hepatosplenomegaly. EXTREMITIES:  No clubbing, cyanosis, or edema. MUSCULOSKELETAL:  No joint deformities or effusions. NEUROLOGIC:  Alert and oriented x3.  Cranial nerves II through XII grossly intact.  Chest x-ray showing no acute cardiopulmonary process.  EKG showing bradycardia at rate of 58 beats per minute.  Axis is normal.  Intervals are normal.  There are no ST-T-wave changes.  Last WBC of 4.7, hemoglobin 12.8, hematocrit 39.3, and platelet 181.  Sodium 135, potassium 3.7, chloride 102, bicarb 24, BUN 20, and creatinine 1.51. Creatinine kinase 87, CK-MB 1.3, and troponin I 0.01.  ASSESSMENT AND PLAN:  This is a 76 year old gentleman with known history of coronary artery disease status post coronary artery bypass grafting approximately 8 years ago, hypertension, and hyperlipidemia who presents with atypical chest pain. 1. Atypical chest pain.  Currently, there is no evidence of ischemia.     The patient has been admitted to telemetry to rule out for     myocardial infarction.  If the patient's cardiac enzymes remain     negative, we would recommend outpatient Myoview.  The patient's     last stress test was in 2008.  Continue the patient on aspirin.  No     beta-blocker to be used secondary to the patient's bradycardia.  We     will review 2-D echo after results.  We will give the patient one     time dose of Toradol if the patient is still with chest pain. 2. Hypertension.  Diovan added per primary care team, stable. 3.  Paroxysmal atrial fibrillation.  Continue the patient on amiodarone     and aspirin.  No Coumadin secondary to history of anemia. 4. Fasting lipid panel.  We would recommend checking fasting lipids.     Of note, the patient has statin intolerance.     Leonette Monarch, PA-C   ______________________________ Colleen Can Deborah Chalk, M.D.    NB/MEDQ  D:  06/02/2010  T:  06/03/2010  Job:  [id number]  Electronically Signed by Alen Blew P.A. on 06/05/2010 03:31:07 PM Electronically Signed by Roger Shelter M.D. on 06/12/2010 03:37:54 PM

## 2010-06-13 ENCOUNTER — Ambulatory Visit
Admission: RE | Admit: 2010-06-13 | Discharge: 2010-06-13 | Payer: Self-pay | Source: Home / Self Care | Attending: Internal Medicine | Admitting: Internal Medicine

## 2010-06-13 DIAGNOSIS — F411 Generalized anxiety disorder: Secondary | ICD-10-CM | POA: Insufficient documentation

## 2010-06-17 NOTE — Assessment & Plan Note (Signed)
Summary: THROAT PROBLEM/ BROWN SPOT ON BACK OF THROAT/NWS   Vital Signs:  Patient profile:   76 year old male Height:      70 inches Weight:      186 pounds BMI:     26.78 O2 Sat:      96 % on Room air Temp:     97.4 degrees F oral Pulse rate:   57 / minute BP sitting:   140 / 86  (left arm) Cuff size:   regular  Vitals Entered By: Zella Ball Ewing CMA Duncan Dull) (December 10, 2009 2:53 PM)  O2 Flow:  Room air  Preventive Care Screening     allergic to tetanus, declines pneumovax  CC: Spot on right side of throat/RE   Primary Care Provider:  Elvina Sidle, MD  CC:  Spot on right side of throat/RE.  History of Present Illness: here to f/u;  has "spot"  or "fold" in the back of the right throat, wonders also about a 'brown spot " to back of the tongue that improved with brushing the tongue;  worried that he has increased is ETOH iuse again in the past 5 yrs (after quitting completley since 76 yo with prior heavy use);;  wonders about cancer/. No fever, wt loss, night sweats, loss of appetite or other constitutional symptoms  - in fact gained 5 lbs on vacation recent staying at the neice;s country home. Pt denies CP, sob, doe, wheezing, orthopnea, pnd, worsening LE edema, palps, dizziness or syncope Pt denies new neuro symptoms such as headache, facial or extremity weakness   Problems Prior to Update: 1)  Nephrolithiasis, Hx of  (ICD-V13.01) 2)  Flank Pain, Left  (ICD-789.09) 3)  Unspecified Disorder of Thyroid  (ICD-246.9) 4)  Coronary Artery Bypass Graft, Hx of  (ICD-V45.81) 5)  Cad  (ICD-414.00) 6)  Atrial Flutter, Chronic  (ICD-427.32) 7)  Bradycardia  (ICD-427.89) 8)  Aaa  (ICD-441.4) 9)  Peripheral Vascular Disease  (ICD-443.9) 10)  Renal Insufficiency  (ICD-588.9) 11)  Gout  (ICD-274.9) 12)  Diverticulosis, Colon  (ICD-562.10) 13)  Low Back Pain  (ICD-724.2) 14)  Gerd  (ICD-530.81) 15)  Colonic Polyps, Hx of  (ICD-V12.72) 16)  Anemia-iron Deficiency  (ICD-280.9) 17)  Psa,  Increased  (ICD-790.93) 18)  Hyperlipidemia  (ICD-272.4) 19)  Hypertension  (ICD-401.9) 20)  Epigastric Pain  (ICD-789.06) 21)  Hx of Gastrointestinal Hemorrhage, Hx of  (ICD-V12.79) 22)  Atrial Fibrillation  (ICD-427.31) 23)  Other Chest Pain  (ICD-786.59)  Medications Prior to Update: 1)  Amiodarone Hcl 200 Mg Tabs (Amiodarone Hcl) .... 1/2 Daily 2)  Allopurinol 300 Mg Tabs (Allopurinol) .Marland Kitchen.. 1 By Mouth Once Daily 3)  Aspirin 81 Mg Tbec (Aspirin) .... Once Daily 4)  Prilosec Otc 20 Mg Tbec (Omeprazole Magnesium) .... Daily 5)  Synthroid 100 Mcg Tabs (Levothyroxine Sodium) .Marland Kitchen.. 1po Once Daily 6)  Levitra 20 Mg Tabs (Vardenafil Hcl) .Marland Kitchen.. 1 By Mouth Every Other Day As Needed  Current Medications (verified): 1)  Amiodarone Hcl 200 Mg Tabs (Amiodarone Hcl) .... 1/2 Daily 2)  Allopurinol 300 Mg Tabs (Allopurinol) .Marland Kitchen.. 1 By Mouth Once Daily 3)  Aspirin 81 Mg Tbec (Aspirin) .... Once Daily 4)  Prilosec Otc 20 Mg Tbec (Omeprazole Magnesium) .... Daily 5)  Synthroid 100 Mcg Tabs (Levothyroxine Sodium) .Marland Kitchen.. 1po Once Daily 6)  Levitra 20 Mg Tabs (Vardenafil Hcl) .Marland Kitchen.. 1 By Mouth Every Other Day As Needed  Allergies (verified): 1)  ! Tetracycline 2)  ! * Tetanus 3)  ! *  Shrimp 4)  ! Phenergan 5)  Doxycycline Hyclate (Doxycycline Hyclate)  Past History:  Past Medical History: Last updated: 01/11/2009 Atrial Fibrillation - non coumadin since 2007 due to anemia Adenomatous Colon Polyps Coronary Artery Disease HyperthyroidismKidney Stones Hypothyroidism Hypertension Hyperlipidemia PSA increased  Anemia-iron deficiency/small AVM's distal duodenum Colonic polyps, hx of - benign GERD Low back pain - chronic/DJD Diverticulosis, colon Gout Renal insufficiency salivary cyst 04/2008 esophageal stricture s/p EGD with dilation 04/2008 left renal artery stenosis < 50% by CT 8/08 Peripheral vascular disease - AAA Nephrolithiasis, hx of  Past Surgical History: Last updated:  09/19/2008 Aorto-Femoral Bypass x5 - stress test neg 9/08 Coronary artery bypass graft s/p afib ablation  Family History: Last updated: 05/03/2008 Family History of Diabetes: Father & Sister Myloma: Mother No FH of Colon Cancer:  Social History: Last updated: 09/19/2008 Occupation: Self employed - Scientist, research (medical) Patient is a former smoker.  Alcohol Use - yes -2 Illicit Drug Use - no Married lives with wife  Risk Factors: Smoking Status: quit (04/19/2008)  Review of Systems  The patient denies anorexia, fever, weight loss, vision loss, decreased hearing, hoarseness, chest pain, syncope, dyspnea on exertion, peripheral edema, prolonged cough, headaches, hemoptysis, abdominal pain, melena, hematochezia, severe indigestion/heartburn, hematuria, muscle weakness, suspicious skin lesions, transient blindness, difficulty walking, depression, unusual weight change, abnormal bleeding, enlarged lymph nodes, and angioedema.         all otherwise negative per pt -  tries to walk 3-4 mles 3 times per wk on treadmill , also for "old age aches and pains"  Physical Exam  General:  alert and overweight-appearing.   Head:  normocephalic and atraumatic.   Eyes:  vision grossly intact, pupils equal, and pupils round.   Ears:  R ear normal and L ear normal.   Nose:  no external deformity and no nasal discharge.   Mouth:  no gingival abnormalities and pharynx pink and moist.  , no color abnormal on the tongue, no masses to the pharynx Neck:  supple and no masses.  except for known mass to right jaw c/w stone with salivary gland Lungs:  normal respiratory effort and normal breath sounds.   Heart:  normal rate and regular rhythm.   Abdomen:  soft, non-tender, and normal bowel sounds.   Msk:  no joint tenderness and no joint swelling.  except for chronic right bicep tear Extremities:  no edema, no erythema  Neurologic:  cranial nerves II-XII intact and strength normal in all extremities.   Skin:  color  normal and no rashes.   Psych:  not depressed appearing and slightly anxious.     Impression & Recommendations:  Problem # 1:  Preventive Health Care (ICD-V70.0)  Overall doing well, age appropriate education and counseling updated and referral for appropriate preventive services done unless declined, immunizations up to date or declined, diet counseling done if overweight, urged to quit smoking if smokes , most recent labs reviewed and current ordered if appropriate, ecg reviewed or declined (interpretation per ECG scanned in the EMR if done); information regarding Medicare Prevention requirements given if appropriate; speciality referrals updated as appropriate   Orders: TLB-BMP (Basic Metabolic Panel-BMET) (80048-METABOL) TLB-CBC Platelet - w/Differential (85025-CBCD) TLB-Hepatic/Liver Function Pnl (80076-HEPATIC) TLB-Lipid Panel (80061-LIPID) TLB-TSH (Thyroid Stimulating Hormone) (84443-TSH) TLB-PSA (Prostate Specific Antigen) (84153-PSA) TLB-Udip ONLY (81003-UDIP)  Problem # 2:  HYPERTENSION (ICD-401.9)  BP today: 140/86 Prior BP: 161/93 (08/22/2009)  Prior 10 Yr Risk Heart Disease: N/A (01/11/2009)  Labs Reviewed: K+: 4.4 (01/11/2009) Creat: : 1.3 (01/11/2009)  Chol: 166 (09/19/2008)   HDL: 36.40 (09/19/2008)   LDL: 90 (09/19/2008)   TG: 200.0 (09/19/2008) pt states adamant BP at home is usually 120 or less sbp   - Continue all previous medications as before this visit   Complete Medication List: 1)  Amiodarone Hcl 200 Mg Tabs (Amiodarone hcl) .... 1/2 daily 2)  Allopurinol 300 Mg Tabs (Allopurinol) .Marland Kitchen.. 1 by mouth once daily 3)  Aspirin 81 Mg Tbec (Aspirin) .... Once daily 4)  Prilosec Otc 20 Mg Tbec (Omeprazole magnesium) .... Daily 5)  Synthroid 100 Mcg Tabs (Levothyroxine sodium) .Marland Kitchen.. 1po once daily 6)  Levitra 20 Mg Tabs (Vardenafil hcl) .Marland Kitchen.. 1 by mouth every other day as needed  Patient Instructions: 1)  Continue all previous medications as before this visit  2)   Please go to the Lab in the basement for your blood and/or urine tests today  3)  Please schedule a follow-up appointment in 1 year or sooner if needed Prescriptions: LEVITRA 20 MG TABS (VARDENAFIL HCL) 1 by mouth every other day as needed  #5 x 11   Entered and Authorized by:   Corwin Levins MD   Signed by:   Corwin Levins MD on 12/10/2009   Method used:   Print then Give to Patient   RxID:   [id number]

## 2010-06-17 NOTE — Progress Notes (Signed)
----   Converted from flag ---- ---- 09/17/2009 9:35 AM, Edman Circle wrote: appt 5/25 @ 9:30  ---- 09/16/2009 4:20 PM, Dagoberto Reef wrote: Thanks  ---- 09/16/2009 4:16 PM, Corwin Levins MD wrote: The following orders have been entered for this patient and placed on Admin Hold:  Type:     Referral       Code:   Radiology Description:   Radiology Referral Order Date:   09/16/2009   Authorized By:   Corwin Levins MD Order #:   [id number] Clinical Notes:   Name of Test or Procedure:  Aortic U/S - f/u AAA size  Of What:  Special Instructions ------------------------------

## 2010-06-17 NOTE — Assessment & Plan Note (Signed)
Summary: 6 MONTH ROV/SL   Primary Provider:  Elvina Sidle, MD  CC:  ROV; NO complaints.  History of Present Illness: Omar Benson returns today for followup.  He is a very pleasant 76 year old male with a history of coronary artery disease status post bypass surgery 5 years ago, he has a history of atrial flutter status post ablation.  He has a history of remote AFib, which has been  resolved on low-dose amiodarone.  He has a history of hypertension.  He has a history of dyslipidemia.  He returns today for followup. He denies c/p, sob, or peripheral edema.  Problems Prior to Update: 1)  Nephrolithiasis, Hx of  (ICD-V13.01) 2)  Flank Pain, Left  (ICD-789.09) 3)  Unspecified Disorder of Thyroid  (ICD-246.9) 4)  Coronary Artery Bypass Graft, Hx of  (ICD-V45.81) 5)  Cad  (ICD-414.00) 6)  Atrial Flutter, Chronic  (ICD-427.32) 7)  Bradycardia  (ICD-427.89) 8)  Aaa  (ICD-441.4) 9)  Peripheral Vascular Disease  (ICD-443.9) 10)  Renal Insufficiency  (ICD-588.9) 11)  Gout  (ICD-274.9) 12)  Diverticulosis, Colon  (ICD-562.10) 13)  Low Back Pain  (ICD-724.2) 14)  Gerd  (ICD-530.81) 15)  Colonic Polyps, Hx of  (ICD-V12.72) 16)  Anemia-iron Deficiency  (ICD-280.9) 17)  Psa, Increased  (ICD-790.93) 18)  Hyperlipidemia  (ICD-272.4) 19)  Hypertension  (ICD-401.9) 20)  Epigastric Pain  (ICD-789.06) 21)  Hx of Gastrointestinal Hemorrhage, Hx of  (ICD-V12.79) 22)  Atrial Fibrillation  (ICD-427.31) 23)  Other Chest Pain  (ICD-786.59)  Current Medications (verified): 1)  Amiodarone Hcl 200 Mg Tabs (Amiodarone Hcl) .... 1/2 Daily 2)  Allopurinol 300 Mg Tabs (Allopurinol) .Marland Kitchen.. 1 By Mouth Once Daily 3)  Aspirin 81 Mg Tbec (Aspirin) .... Once Daily 4)  Prilosec Otc 20 Mg Tbec (Omeprazole Magnesium) .... Daily 5)  Synthroid 100 Mcg Tabs (Levothyroxine Sodium) .Marland Kitchen.. 1po Once Daily 6)  Levitra 20 Mg Tabs (Vardenafil Hcl) .Marland Kitchen.. 1 By Mouth Every Other Day As Needed  Allergies: 1)  ! Tetracycline 2)  ! *  Tetanus 3)  ! * Shrimp 4)  ! Phenergan 5)  Doxycycline Hyclate (Doxycycline Hyclate)  Past History:  Past Medical History: Last updated: 01/11/2009 Atrial Fibrillation - non coumadin since 2007 due to anemia Adenomatous Colon Polyps Coronary Artery Disease HyperthyroidismKidney Stones Hypothyroidism Hypertension Hyperlipidemia PSA increased  Anemia-iron deficiency/small AVM's distal duodenum Colonic polyps, hx of - benign GERD Low back pain - chronic/DJD Diverticulosis, colon Gout Renal insufficiency salivary cyst 04/2008 esophageal stricture s/p EGD with dilation 04/2008 left renal artery stenosis < 50% by CT 8/08 Peripheral vascular disease - AAA Nephrolithiasis, hx of  Past Surgical History: Last updated: 09/19/2008 Aorto-Femoral Bypass x5 - stress test neg 9/08 Coronary artery bypass graft s/p afib ablation  Review of Systems  The patient denies chest pain, syncope, dyspnea on exertion, and peripheral edema.    Vital Signs:  Patient profile:   76 year old male Height:      70 inches Weight:      182 pounds BMI:     26.21 Pulse rate:   55 / minute BP sitting:   186 / 98  (left arm) Cuff size:   regular  Vitals Entered By: Stanton Kidney, EMT-P (June 25, 2009 11:06 AM)  Physical Exam  General:  alert and well-developed.   Head:  normocephalic and atraumatic.   Eyes:  vision grossly intact, pupils equal, and pupils round.   Mouth:  no gingival abnormalities and pharynx pink and moist.   Neck:  supple and no masses.   Lungs:  normal respiratory effort and normal breath sounds.   Heart:  normal rate and regular rhythm.   Abdomen:  soft, non-tender, and normal bowel sounds.   Pulses:  pulses normal in all 4 extremities Extremities:  no edema, no erythema  Neurologic:  cranial nerves II-XII intact and strength normal in all extremities.     EKG  Procedure date:  06/25/2009  Findings:      Sinus bradycardia with rate of:  55.  Impression &  Recommendations:  Problem # 1:  CAD (ICD-414.00) He has no anginal symptoms despite his elevated blood pressure. His updated medication list for this problem includes:    Aspirin 81 Mg Tbec (Aspirin) ..... Once daily    Benazepril Hcl 10 Mg Tabs (Benazepril hcl) ..... One by mouth once daily  Problem # 2:  BRADYCARDIA (ICD-427.89) His heart rates have been stable.  Continue low dose amiodarone. His updated medication list for this problem includes:    Amiodarone Hcl 200 Mg Tabs (Amiodarone hcl) .Marland Kitchen... 1/2 daily    Aspirin 81 Mg Tbec (Aspirin) ..... Once daily    Benazepril Hcl 10 Mg Tabs (Benazepril hcl) ..... One by mouth once daily  Problem # 3:  HYPERTENSION (ICD-401.9) His blood pressure is markedly elevated today.  He admits to white coat syndrome and dietary indiscretion with sodium.  I have recommended that he start HCTZ and Benazapril.  We will have him return for lab work in several days. His updated medication list for this problem includes:    Aspirin 81 Mg Tbec (Aspirin) ..... Once daily    Hydrochlorothiazide 25 Mg Tabs (Hydrochlorothiazide) .Marland Kitchen... Take one tablet by mouth daily.    Benazepril Hcl 10 Mg Tabs (Benazepril hcl) ..... One by mouth once daily  Patient Instructions: 1)  Your physician recommends that you schedule a follow-up appointment in: 8 weeks with Dr [name] 2)  Your physician has recommended you make the following change in your medication: start HCTZ 25mg  daily, start Benazepril 10mg  daily 3)  Your physician recommends that you return for lab work in:one week BMP Prescriptions: BENAZEPRIL HCL 10 MG TABS (BENAZEPRIL HCL) one by mouth once daily  #30 x 11   Entered by:   Dennis Bast, RN, BSN   Authorized by:   Laren Boom, MD, [facility]   Signed by:   Dennis Bast, RN, BSN on 06/25/2009   Method used:   Electronically to        [facility] (retail)       [address]       [place], [place]  [address]       Ph:  [phone] or [phone]       Fax: [phone]   RxID:   [id number] HYDROCHLOROTHIAZIDE 25 MG TABS (HYDROCHLOROTHIAZIDE) Take one tablet by mouth daily.  #30 x 11   Entered by:   Dennis Bast, RN, BSN   Authorized by:   Laren Boom, MD, [facility]   Signed by:   Dennis Bast, RN, BSN on 06/25/2009   Method used:   Electronically to        [facility] (retail)       [address]       [place], [place]  [address]       Ph: [phone] or [phone]       Fax:  [phone]   RxID:   [id number]

## 2010-06-17 NOTE — Assessment & Plan Note (Signed)
Summary: per check out   Visit Type:  Follow-up Primary Provider:  Elvina Sidle, MD   History of Present Illness: [name] returns today for followup.  He is a very pleasant 76 year old male with a history of coronary artery disease status post bypass surgery 5 years ago, he has a history of atrial flutter status post ablation.  He has a history of remote AFib, which has been  resolved on low-dose amiodarone.  He has a history of hypertension.  He has a history of dyslipidemia.  He returns today for followup. He denies c/p, sob, or peripheral edema. He is bothered by pain in his left flank which is always present.  He has had a CT scan which he reports was unremarkable.  Current Medications (verified): 1)  Amiodarone Hcl 200 Mg Tabs (Amiodarone Hcl) .... 1/2 Daily 2)  Allopurinol 300 Mg Tabs (Allopurinol) .Marland Kitchen.. 1 By Mouth Once Daily 3)  Aspirin 81 Mg Tbec (Aspirin) .... Once Daily 4)  Prilosec Otc 20 Mg Tbec (Omeprazole Magnesium) .... Daily 5)  Synthroid 100 Mcg Tabs (Levothyroxine Sodium) .Marland Kitchen.. 1po Once Daily 6)  Levitra 20 Mg Tabs (Vardenafil Hcl) .Marland Kitchen.. 1 By Mouth Every Other Day As Needed  Allergies: 1)  ! Tetracycline 2)  ! * Tetanus 3)  ! * Shrimp 4)  ! Phenergan 5)  Doxycycline Hyclate (Doxycycline Hyclate)  Past History:  Past Medical History: Last updated: 01/11/2009 Atrial Fibrillation - non coumadin since 2007 due to anemia Adenomatous Colon Polyps Coronary Artery Disease HyperthyroidismKidney Stones Hypothyroidism Hypertension Hyperlipidemia PSA increased  Anemia-iron deficiency/small AVM's distal duodenum Colonic polyps, hx of - benign GERD Low back pain - chronic/DJD Diverticulosis, colon Gout Renal insufficiency salivary cyst 04/2008 esophageal stricture s/p EGD with dilation 04/2008 left renal artery stenosis < 50% by CT 8/08 Peripheral vascular disease - AAA Nephrolithiasis, hx of  Past Surgical History: Last updated: 09/19/2008 Aorto-Femoral  Bypass x5 - stress test neg 9/08 Coronary artery bypass graft s/p afib ablation  Review of Systems  The patient denies chest pain, syncope, dyspnea on exertion, and peripheral edema.    Vital Signs:  Patient profile:   76 year old male Height:      70 inches Weight:      179 pounds BMI:     25.78 Pulse rate:   57 / minute BP sitting:   161 / 93  (left arm)  Vitals Entered By: Laurance Flatten CMA (August 22, 2009 11:35 AM)  Physical Exam  General:  alert and well-developed.   Head:  normocephalic and atraumatic.   Eyes:  vision grossly intact, pupils equal, and pupils round.   Neck:  supple and no masses.   Lungs:  normal respiratory effort and normal breath sounds.   Heart:  normal rate and regular rhythm.   Abdomen:  soft, non-tender, and normal bowel sounds.   Msk:  has some point tenderness he points to at the left side approx 10th nerve area at the lateral left flank area; no rash or erythema or swelling noted Pulses:  pulses normal in all 4 extremities Extremities:  no edema, no erythema  Neurologic:  cranial nerves II-XII intact and strength normal in all extremities.     Impression & Recommendations:  Problem # 1:  CAD (ICD-414.00) No anginal symptoms today.  He will continue his current meds. The following medications were removed from the medication list:    Benazepril Hcl 10 Mg Tabs (Benazepril hcl) ..... One by mouth once daily His updated medication  list for this problem includes:    Aspirin 81 Mg Tbec (Aspirin) ..... Once daily  Problem # 2:  FLANK PAIN, LEFT (ICD-789.09) The etiology is unclear. It could well be muscular but I have asked him to continue to watch and we will followup.  Problem # 3:  HYPERTENSION (ICD-401.9) His blood pressure has been up and down.  I have asked him to continue his meds as below and maintain a low sodium diet. The following medications were removed from the medication list:    Hydrochlorothiazide 25 Mg Tabs  (Hydrochlorothiazide) .Marland Kitchen... Take one tablet by mouth daily.    Benazepril Hcl 10 Mg Tabs (Benazepril hcl) ..... One by mouth once daily His updated medication list for this problem includes:    Aspirin 81 Mg Tbec (Aspirin) ..... Once daily  Patient Instructions: 1)  Your physician recommends that you return for lab work after 4/24: bmet (401.0;414.08) 2)  Your physician wants you to follow-up in: 6 months. You will receive a reminder letter in the mail two months in advance. If you don't receive a letter, please call our office to schedule the follow-up appointment.

## 2010-06-17 NOTE — Progress Notes (Signed)
Summary: lab work  Phone Note Call from Patient Call back at [employer] [phone] Call back at Work Phone [phone]   Caller: Patient Reason for Call: Talk to Nurse Summary of Call: request lab work prior to appt Initial call taken by: Migdalia Dk,  June 07, 2009 1:40 PM  Follow-up for Phone Call        hasn't taken anything for Cholesterol in over a year  05/05/08 was the last time he took any med for his cholesterol.  Had a lipid panel drawn on 5/10 and liver in 8/10 wants more blood work before appointment  I told him he really didn't need it because when he had it drawn in 5/10 he was not on meds then. Dennis Bast, RN, BSN  June 07, 2009 2:03 PM discussed with Dr [name] no need for labs at present Dennis Bast, RN, BSN  June 11, 2009 5:18 PM

## 2010-06-17 NOTE — Miscellaneous (Signed)
Summary: Orders Update  Clinical Lists Changes  Orders: Added new Referral order of Radiology Referral (Radiology) - Signed  Appended Document: Orders Update robin - to call pt to inform, that I went ahead and ordered the f/u aoritc u/s for his AAA f/u that is now due  Appended Document: Orders Update called pt left msg to call back  Appended Document: Orders Update called pt informed of above information

## 2010-06-17 NOTE — Miscellaneous (Signed)
Summary: Orders Update  Clinical Lists Changes  Orders: Added new Test order of Abdominal Aorta Duplex (Abd Aorta Duplex) - Signed 

## 2010-06-17 NOTE — Assessment & Plan Note (Signed)
Summary: BACK PAIN-LB   Vital Signs:  Patient profile:   76 year old male Height:      69 inches Weight:      183.25 pounds BMI:     27.16 O2 Sat:      97 % on Room air Temp:     97.6 degrees F oral Pulse rate:   60 / minute BP sitting:   140 / 78  (left arm) Cuff size:   regular  Vitals Entered By: Zella Ball Ewing CMA Duncan Dull) (March 03, 2010 1:41 PM)  O2 Flow:  Room air CC: Back pain for one week/RE   Primary Care Provider:  Elvina Sidle, MD  CC:  Back pain for one week/RE.  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  No recent fall, injury, gait change;  and no LE pain, weak, numbness.  No fever, wt loss, night sweats, loss of appetite or other constitutional symptoms  In fact gained 3 lbs recently "that I cant lose."  Back pain worse to twist and bend to the right, and move the right arm across the chest to the left.  Pain does some radiate to the right lateral chest, and was concerned about his heart.  Pt denies other CP, worsening sob, doe, wheezing, orthopnea, pnd, worsening LE edema, palps, dizziness or syncope Pt denies new neuro symptoms such as headache, facial or extremity weakness   Problems Prior to Update: 1)  Chest Pain  (ICD-786.50) 2)  Back Pain  (ICD-724.5) 3)  Preventive Health Care  (ICD-V70.0) 4)  Nephrolithiasis, Hx of  (ICD-V13.01) 5)  Flank Pain, Left  (ICD-789.09) 6)  Unspecified  Disorder of Thyroid  (ICD-246.9) 7)  Coronary Artery Bypass Graft, Hx of  (ICD-V45.81) 8)  Cad  (ICD-414.00) 9)  Atrial Flutter, Chronic  (ICD-427.32) 10)  Bradycardia  (ICD-427.89) 11)  Aaa  (ICD-441.4) 12)  Peripheral Vascular Disease  (ICD-443.9) 13)  Renal Insufficiency  (ICD-588.9) 14)  Gout  (ICD-274.9) 15)  Diverticulosis, Colon  (ICD-562.10) 16)  Low Back Pain  (ICD-724.2) 17)  Gerd  (ICD-530.81) 18)  Colonic Polyps, Hx of  (ICD-V12.72) 19)  Anemia-iron Deficiency  (ICD-280.9) 20)  Psa, Increased  (ICD-790.93) 21)  Hyperlipidemia  (ICD-272.4) 22)  Hypertension  (ICD-401.9) 23)  Epigastric Pain  (ICD-789.06) 24)  Hx of Gastrointestinal Hemorrhage, Hx of  (ICD-V12.79) 25)  Atrial Fibrillation  (ICD-427.31) 26)  Other Chest Pain  (ICD-786.59)  Medications Prior to Update: 1)  Amiodarone Hcl 200 Mg Tabs (Amiodarone Hcl) .... 1/2 Daily 2)  Allopurinol 300 Mg Tabs (Allopurinol) .Marland Kitchen.. 1 By Mouth Once Daily 3)  Aspirin 81 Mg Tbec (Aspirin) .... Once Daily 4)  Prilosec Otc 20 Mg Tbec (Omeprazole Magnesium) .... Daily 5)  Synthroid 100 Mcg Tabs (Levothyroxine Sodium) .Marland Kitchen.. 1po Once Daily 6)  Levitra 20 Mg Tabs (Vardenafil Hcl) .Marland KitchenMarland KitchenMarland Kitchen  1 By Mouth Every Other Day As Needed  Current Medications (verified): 1)  Amiodarone Hcl 200 Mg Tabs (Amiodarone Hcl) .... 1/2 Daily 2)  Allopurinol 300 Mg Tabs (Allopurinol) .Marland Kitchen.. 1 By Mouth Once Daily 3)  Aspirin 81 Mg Tbec (Aspirin) .... Once Daily 4)  Prilosec Otc 20 Mg Tbec (Omeprazole Magnesium) .... Daily 5)  Synthroid 100 Mcg Tabs (Levothyroxine Sodium) .Marland Kitchen.. 1po Once Daily 6)  Levitra 20 Mg Tabs (Vardenafil Hcl) .Marland Kitchen.. 1 By Mouth Every Other Day As Needed 7)  Tramadol Hcl 50 Mg Tabs (Tramadol Hcl) .Marland Kitchen.. 1po Q 6 Hrs As Needed 8)  Flexeril 5 Mg Tabs (Cyclobenzaprine Hcl) .Marland Kitchen.. 1po Three Times A Day As Needed  Allergies (verified): 1)  ! Tetracycline 2)  ! * Tetanus 3)  ! * Shrimp 4)  ! Phenergan 5)  Doxycycline Hyclate (Doxycycline  Hyclate)  Past History:  Past Medical History: Last updated: 01/11/2009 Atrial Fibrillation - non coumadin since 2007 due to anemia Adenomatous Colon Polyps Coronary Artery Disease HyperthyroidismKidney Stones Hypothyroidism Hypertension Hyperlipidemia PSA increased  Anemia-iron deficiency/small AVM's distal duodenum Colonic polyps, hx of - benign GERD Low back pain - chronic/DJD Diverticulosis, colon Gout Renal insufficiency salivary cyst 04/2008 esophageal stricture s/p EGD with dilation 04/2008 left renal artery stenosis < 50% by CT 8/08 Peripheral vascular disease - AAA Nephrolithiasis, hx of  Past Surgical History: Last updated: 09/19/2008 Aorto-Femoral Bypass x5 - stress test neg 9/08 Coronary artery bypass graft s/p afib ablation  Social History: Last updated: 09/19/2008 Occupation: Self employed - Scientist, research (medical) Patient is a former smoker.  Alcohol Use - yes -2 Illicit Drug Use - no Married lives with wife  Risk Factors: Smoking Status: quit (04/19/2008)  Review of Systems       all otherwise negative per pt -    Physical Exam  General:  alert and well-developed.   Head:  normocephalic and atraumatic.   Eyes:  vision grossly intact, pupils equal, and pupils round.   Ears:  R ear normal and L ear normal.   Nose:  no external deformity and no nasal discharge.   Mouth:  no gingival abnormalities and pharynx pink and moist.  , no color abnormal on the tongue, no masses to the pharynx Neck:  supple and no masses.  except for known mass to right jaw c/w stone with salivary gland Lungs:  normal respiratory effort and normal breath sounds.   Heart:  normal rate and regular rhythm.   Msk:  no acute joint tenderness and no joint swelling.  except for chronic right bicep tear; has point tenderness to the right paravertebral area approx t6 without sweling, rash and points to the dermatomal area as source of pain around to the right lateral chest only;  note he has  extrememely well developed upper back musculature adn shoulders/bicep with hx of roofer relative to other musculature  Extremities:  no edema, no erythema  Neurologic:  strength normal in all extremities.   Skin:  color normal and no rashes.   Psych:  moderately anxious.  , may be mild depressive in demeanor   Impression & Recommendations:  Problem # 1:  BACK PAIN (ICD-724.5)  His updated medication list for this problem includes:    Aspirin 81 Mg Tbec (Aspirin) ..... Once daily    Tramadol Hcl 50 Mg Tabs (Tramadol hcl) .Marland Kitchen... 1po q 6 hrs as needed    Flexeril 5 Mg Tabs (Cyclobenzaprine hcl) .Marland Kitchen... 1po three times a day as needed supect msk strain - for  pain med and muscle relaxer, check t-spine film, treat as above, f/u any worsening signs or symptoms , consider further eval adn /or ortho eval depending on eval and response to initial tx  Orders: T-Thoracic Spine 2 Views [phone])  Problem # 2:  CHEST PAIN (ICD-786.50)  right lateral, ? MSK vs radicular pain - for cxr, ecg  Orders: EKG w/ Interpretation (93000) T-2 View CXR, Same Day (71020.5TC)  Problem # 3:  HYPERTENSION (ICD-401.9)  BP today: 140/78 Prior BP: 140/86 (12/10/2009)  Prior 10 Yr Risk Heart Disease: N/A (01/11/2009)  Labs Reviewed: K+: 4.9 (12/10/2009) Creat: : 1.4 (12/10/2009)   Chol: 194 (12/10/2009)   HDL: 40.60 (12/10/2009)   LDL: 90 (09/19/2008)   TG: 217.0 (12/10/2009) stable overall by hx and exam, ok to continue meds/tx as is - declines further meds today  Complete Medication List: 1)  Amiodarone Hcl 200 Mg Tabs (Amiodarone hcl) .... 1/2 daily 2)  Allopurinol 300 Mg Tabs (Allopurinol) .Marland Kitchen.. 1 by mouth once daily 3)  Aspirin 81 Mg Tbec (Aspirin) .... Once daily 4)  Prilosec Otc 20 Mg Tbec (Omeprazole magnesium) .... Daily 5)  Synthroid 100 Mcg Tabs (Levothyroxine sodium) .Marland Kitchen.. 1po once daily 6)  Levitra 20 Mg Tabs (Vardenafil hcl) .Marland Kitchen.. 1 by mouth every other day as needed 7)  Tramadol Hcl 50 Mg Tabs  (Tramadol hcl) .Marland Kitchen.. 1po q 6 hrs as needed 8)  Flexeril 5 Mg Tabs (Cyclobenzaprine hcl) .Marland Kitchen.. 1po three times a day as needed  Patient Instructions: 1)  Please take all new medications as prescribed 2)  Continue all previous medications as before this visit  3)  Please go to Radiology in the basement level for your X-Ray today  4)  Please schedule a follow-up appointment as needed. Prescriptions: FLEXERIL 5 MG TABS (CYCLOBENZAPRINE HCL) 1po three times a day as needed  #60 x 1   Entered and Authorized by:   Corwin Levins MD   Signed by:   Corwin Levins MD on 03/03/2010   Method used:   Print then Give to Patient   RxID:   [id number] TRAMADOL HCL 50 MG TABS (TRAMADOL HCL) 1po q 6 hrs as needed  #50 x 1   Entered and Authorized by:   Corwin Levins MD   Signed by:   Corwin Levins MD on 03/03/2010   Method used:   Print then Give to Patient   RxID:   [id number]    Orders Added: 1)  EKG w/ Interpretation [93000] 2)  T-Thoracic Spine 2 Views [72070TC] 3)  T-2 View CXR, Same Day [71020.5TC] 4)  Est. Patient Level IV [66440]

## 2010-06-19 NOTE — Assessment & Plan Note (Signed)
Summary: post er/chest pain/lb   Vital Signs:  Patient profile:   76 year old male Height:      70 inches Weight:      186.13 pounds BMI:     26.80 O2 Sat:      95 % on Room air Temp:     98.7 degrees F oral Pulse rate:   60 / minute BP sitting:   120 / 70  (left arm) Cuff size:   regular  Vitals Entered By: Zella Ball Ewing CMA Duncan Dull) (June 13, 2010 2:07 PM)  O2 Flow:  Room air  CC: Post Hospital/RE   Primary Care Provider:  Elvina Sidle, MD  CC:  Post Hospital/RE.  History of Present Illness: here to f/u recentt chest pain - bilat upper sternal , pressure like, assoc iwth severe indigestoin, but also had a tender spot the mid upper ant chest as well, no bettere with ASA at home, and wife took him to ER woitnh evaluation culiminating in stress test jan 26 - normal.  Has 2 metal ties to the chest post surgury that seem to try to work themselves out and tender to touch and seems worse with certain excercises he does at home for his back;  still with some intermiitent discomfort; better with advil about noon today;  has not tried tylenol as needed.  Does not like to take pain meds, but flexeril has helped in the pastin the past and he has current rx and bottle left over.   Pt denies other CP, worsening sob, doe, wheezing, orthopnea, pnd, worsening LE edema, palps, dizziness or syncope . Pt denies new neuro symptoms such as headache, facial or extremity weakness , except has a "little bit of carpal tunnetl" on the left occasionally.  Pt denies polydipsia, polyuria.  Denies worsening depressive symptoms, suicidal ideation, or panic, though has had increased anxiety recently.  Problems Prior to Update: 1)  Anxiety  (ICD-300.00) 2)  Chest Pain  (ICD-786.50) 3)  Back Pain  (ICD-724.5) 4)  Preventive Health Care  (ICD-V70.0) 5)  Nephrolithiasis, Hx of  (ICD-V13.01) 6)  Flank Pain, Left  (ICD-789.09) 7)  Unspecified Disorder of Thyroid  (ICD-246.9) 8)  Coronary Artery Bypass Graft, Hx of   (ICD-V45.81) 9)  Cad  (ICD-414.00) 10)  Atrial Flutter, Chronic  (ICD-427.32) 11)  Bradycardia  (ICD-427.89) 12)  Aaa  (ICD-441.4) 13)  Peripheral Vascular Disease  (ICD-443.9) 14)  Renal Insufficiency  (ICD-588.9) 15)  Gout  (ICD-274.9) 16)  Diverticulosis, Colon  (ICD-562.10) 17)  Low Back Pain  (ICD-724.2) 18)  Gerd  (ICD-530.81) 19)  Colonic Polyps, Hx of  (ICD-V12.72) 20)  Anemia-iron Deficiency  (ICD-280.9) 21)  Psa, Increased  (ICD-790.93) 22)  Hyperlipidemia  (ICD-272.4) 23)  Hypertension  (ICD-401.9) 24)  Epigastric Pain  (ICD-789.06) 25)  Hx of Gastrointestinal Hemorrhage, Hx of  (ICD-V12.79) 26)  Atrial Fibrillation  (ICD-427.31) 27)  Other Chest Pain  (ICD-786.59)  Medications Prior to Update: 1)  Amiodarone Hcl 200 Mg Tabs (Amiodarone Hcl) .... 1/2 Daily 2)  Allopurinol 300 Mg Tabs (Allopurinol) .Marland Kitchen.. 1 By Mouth Once Daily 3)  Aspirin 81 Mg Tbec (Aspirin) .... Once Daily 4)  Prilosec Otc 20 Mg Tbec (Omeprazole Magnesium) .... Daily 5)  Synthroid 100 Mcg Tabs (Levothyroxine Sodium) .Marland Kitchen.. 1po Once Daily 6)  Levitra 20 Mg Tabs (Vardenafil Hcl) .Marland Kitchen.. 1 By Mouth Every Other Day As Needed 7)  Tramadol Hcl 50 Mg Tabs (Tramadol Hcl) .Marland Kitchen.. 1po Q 6 Hrs As Needed 8)  Flexeril 5  Mg Tabs (Cyclobenzaprine Hcl) .Marland Kitchen.. 1po Three Times A Day As Needed  Current Medications (verified): 1)  Amiodarone Hcl 200 Mg Tabs (Amiodarone Hcl) .... 1/2 Daily 2)  Allopurinol 300 Mg Tabs (Allopurinol) .Marland Kitchen.. 1 By Mouth Once Daily 3)  Aspirin 81 Mg Tbec (Aspirin) .... Once Daily 4)  Prilosec Otc 20 Mg Tbec (Omeprazole Magnesium) .... Daily 5)  Synthroid 100 Mcg Tabs (Levothyroxine Sodium) .Marland Kitchen.. 1po Once Daily 6)  Levitra 20 Mg Tabs (Vardenafil Hcl) .Marland Kitchen.. 1 By Mouth Every Other Day As Needed 7)  Tramadol Hcl 50 Mg Tabs (Tramadol Hcl) .Marland Kitchen.. 1po Q 6 Hrs As Needed 8)  Flexeril 5 Mg Tabs (Cyclobenzaprine Hcl) .Marland Kitchen.. 1po Three Times A Day As Needed 9)  Alprazolam 0.25 Mg Tabs (Alprazolam) .Marland Kitchen.. 1po Two Times A Day  As Needed  Allergies (verified): 1)  ! Tetracycline 2)  ! * Tetanus 3)  ! * Shrimp 4)  ! 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  Alcohol Use - yes -2 Illicit Drug Use - no Married lives with wife  Risk Factors: Smoking Status: quit (04/19/2008)  Past Medical History: Atrial Fibrillation - non coumadin since 2007 due to anemia Adenomatous Colon Polyps Coronary Artery Disease HyperthyroidismKidney Stones Hypothyroidism Hypertension Hyperlipidemia PSA increased  Anemia-iron deficiency/small AVM's distal duodenum Colonic polyps, hx of - benign GERD Low back pain - chronic/DJD Diverticulosis, colon Gout Renal insufficiency salivary cyst 04/2008 esophageal stricture s/p EGD with dilation 04/2008 left renal artery stenosis < 50% by CT 8/08 Peripheral vascular disease - AAA Nephrolithiasis, hx of Anxiety  Review of Systems       all otherwise negative per pt -    Physical Exam  General:  alert and overweight-appearing.   Head:  normocephalic and atraumatic.   Eyes:  vision grossly intact, pupils equal, and pupils round.   Ears:  R ear normal and L ear normal.   Nose:  no external deformity and no nasal discharge.   Mouth:  no gingival abnormalities and pharynx pink and moist.   Neck:  supple and no masses.   Lungs:  normal respiratory effort, R decreased breath sounds, and L decreased breath sounds.  without wheezing or rales Heart:  normal rate and regular rhythm.   Abdomen:  soft, non-tender, and normal bowel sounds.   Extremities:  no edema, no erythema  Psych:  not depressed appearing and slightly anxious.     Impression & Recommendations:  Problem # 1:  CHEST PAIN  (ICD-786.50) stress test reviewed with pt;  etiology c/w MSK +/- reflux, Continue all previous medications as before this visit   Problem # 2:  HYPERTENSION (ICD-401.9)  BP today: 120/70 Prior BP: 140/78 (03/03/2010)  Prior 10 Yr Risk Heart Disease: N/A (01/11/2009)  Labs Reviewed: K+: 4.9 (12/10/2009) Creat: : 1.4 (12/10/2009)   Chol: 194 (12/10/2009)   HDL: 40.60 (12/10/2009)   LDL: 90 (09/19/2008)   TG: 217.0 (12/10/2009) stable overall by hx and exam, ok to continue meds/tx as is - no new meds needed at this time  Problem # 3:  ANXIETY (ICD-300.00)  His updated medication list for this problem includes:    Alprazolam 0.25 Mg Tabs (Alprazolam) .Marland Kitchen... 1po two times a day as needed treat as above, f/u any worsening signs or  symptoms   Discussed medication use and relaxation techniques.   Problem # 4:  GERD (ICD-530.81)  His updated medication list for this problem includes:    Prilosec Otc 20 Mg Tbec (Omeprazole magnesium) .Marland Kitchen... Daily  EGD: Location: [facility]   (04/30/2008)  Labs Reviewed: Hgb: 14.0 (12/10/2009)   Hct: 41.0 (12/10/2009) stable overall by hx and exam, ok to continue meds/tx as is   Complete Medication List: 1)  Amiodarone Hcl 200 Mg Tabs (Amiodarone hcl) .... 1/2 daily 2)  Allopurinol 300 Mg Tabs (Allopurinol) .Marland Kitchen.. 1 by mouth once daily 3)  Aspirin 81 Mg Tbec (Aspirin) .... Once daily 4)  Prilosec Otc 20 Mg Tbec (Omeprazole magnesium) .... Daily 5)  Synthroid 100 Mcg Tabs (Levothyroxine sodium) .Marland Kitchen.. 1po once daily 6)  Levitra 20 Mg Tabs (Vardenafil hcl) .Marland Kitchen.. 1 by mouth every other day as needed 7)  Tramadol Hcl 50 Mg Tabs (Tramadol hcl) .Marland Kitchen.. 1po q 6 hrs as needed 8)  Flexeril 5 Mg Tabs (Cyclobenzaprine hcl) .Marland Kitchen.. 1po three times a day as needed 9)  Alprazolam 0.25 Mg Tabs (Alprazolam) .Marland Kitchen.. 1po two times a day as needed  Patient Instructions: 1)  Please take all new medications as prescribed 2)  Continue all previous medications as before  this visit 3)  Please schedule a follow-up appointment in 6 months for CPX with labs Prescriptions: ALPRAZOLAM 0.25 MG TABS (ALPRAZOLAM) 1po two times a day as needed  #60 x 1   Entered and Authorized by:   Corwin Levins MD   Signed by:   Corwin Levins MD on 06/13/2010   Method used:   Print then Give to Patient   RxID:   [id number]    Orders Added: 1)  Est. Patient Level IV [56213]

## 2010-06-19 NOTE — Progress Notes (Signed)
Summary: Nuclear Pre-Procedure  Phone Note Outgoing Call Call back at [facility] Phone [phone]   Call placed by: Stanton Kidney, EMT-P,  June 11, 2010 3:02 PM Action Taken: Phone Call Completed Summary of Call: Left message with information on [employer] Information Sheet (see scanned document for details). Stanton Kidney, EMT-P  June 11, 2010 3:02 PM      Nuclear Med Background Indications for Stress Test: Evaluation for Ischemia, Graft Patency, Post Hospital  Indications Comments: 06/02/10 chest pain, negative enzymes  History: Ablation, CABG, Echo, Heart Catheterization  History Comments: '04 CABG; 06/03/10 Echo:EF=60-65%; h/o afib.  Symptoms: Chest Pain, Chest Pressure    Nuclear Pre-Procedure Cardiac Risk Factors: History of Smoking, Hypertension, Lipids Height (in): 69

## 2010-06-19 NOTE — Assessment & Plan Note (Signed)
Summary: Cardiology Nuclear Testing  Nuclear Med Background Indications for Stress Test: Evaluation for Ischemia, Graft Patency, Post Hospital  Indications Comments: 06/02/10 chest pain, negative enzymes  History: Ablation, CABG, Echo, Heart Catheterization, Myocardial Perfusion Study  History Comments: '04 CABG; 06/03/10 Echo:EF=60-65%; '08 ZOX:WRUEAV, EF=62%; h/o afib.  Symptoms: Chest Pain, Chest Pressure, Palpitations  Symptoms Comments: Last episode of WU:JWJX night. After walking on TM, patient stated he has had CP since last night.   Nuclear Pre-Procedure Cardiac Risk Factors: History of Smoking, Hypertension, Lipids Caffeine/Decaff Intake: None NPO After: 9:30 PM Lungs: Clear. IV 0.9% NS with Angio Cath: 20g     IV Site: R Antecubital IV Started by: Irean Hong, RN Chest Size (in) 42     Height (in): 70 Weight (lb): 181 BMI: 26.06  Nuclear Med Study 1 or 2 day study:  1 day     Stress Test Type:  Treadmill/Lexiscan Reading MD:  Cassell Clement, MD     Referring MD:  Oliver Barre, MD Resting Radionuclide:  Technetium 56m Tetrofosmin     Resting Radionuclide Dose:  10.7 mCi  Stress Radionuclide:  Technetium 86m Tetrofosmin     Stress Radionuclide Dose:  33.0 mCi   Stress Protocol Exercise Time (min):  12:31 min     Max HR:  115 bpm     Predicted Max HR:  147 bpm  Max Systolic BP: 189 mm Hg     Percent Max HR:  78.23 %     METS: 11.6 Rate Pressure Product:  91478  Lexiscan: 0.4 mg   Stress Test Technologist:  Rea [employer], CMA-N     Nuclear Technologist:  Domenic Polite, CNMT  Rest Procedure  Myocardial perfusion imaging was performed at rest 45 minutes following the intravenous administration of Technetium 69m Tetrofosmin.  Stress Procedure  The patient attempted to walk on the treadmill utilizing the Bruce protocol for 10:30, but was unable to reach his target heart rate.  He was then given IV Lexiscan 0.4 mg over 15-seconds with continued low level exercise and  then Technetium 27m Tetrofosmin was injected at 30-seconds.  There were no significant ST-T wave changes with infusion.  He did have a mild hypertensive response on the treadmill prior to infusion of lexiscan, 160/102.  Quantitative spect images were obtained after a 45 minute delay.  QPS Raw Data Images:  Normal; no motion artifact; normal heart/lung ratio. Stress Images:  Normal homogeneous uptake in all areas of the myocardium. Rest Images:  Normal homogeneous uptake in all areas of the myocardium. Subtraction (SDS):  No evidence of ischemia. Transient Ischemic Dilatation:  0.96  (Normal <1.22)  Lung/Heart Ratio:  0.10  (Normal <0.45)  Quantitative Gated Spect Images QGS EDV:  95 ml QGS ESV:  32 ml QGS EF:  66 %  Findings Normal nuclear study      Overall Impression  Exercise Capacity: Lexiscan with low level  exercise. BP Response: Hypertensive blood pressure response. Clinical Symptoms: Mild chest pain/dyspnea. ECG Impression: No significant ST segment change suggestive of ischemia. Overall Impression: Normal stress nuclear study. Overall Impression Comments: No wall motion abnormalities.

## 2010-07-15 ENCOUNTER — Encounter: Payer: Self-pay | Admitting: Gastroenterology

## 2010-07-15 ENCOUNTER — Encounter (INDEPENDENT_AMBULATORY_CARE_PROVIDER_SITE_OTHER): Payer: Self-pay | Admitting: *Deleted

## 2010-07-15 ENCOUNTER — Other Ambulatory Visit: Payer: Medicare Other

## 2010-07-15 ENCOUNTER — Other Ambulatory Visit: Payer: Self-pay | Admitting: Gastroenterology

## 2010-07-15 ENCOUNTER — Ambulatory Visit (INDEPENDENT_AMBULATORY_CARE_PROVIDER_SITE_OTHER): Payer: Medicare Other | Admitting: Gastroenterology

## 2010-07-15 DIAGNOSIS — Z79899 Other long term (current) drug therapy: Secondary | ICD-10-CM

## 2010-07-15 DIAGNOSIS — K219 Gastro-esophageal reflux disease without esophagitis: Secondary | ICD-10-CM

## 2010-07-15 DIAGNOSIS — I4891 Unspecified atrial fibrillation: Secondary | ICD-10-CM

## 2010-07-15 DIAGNOSIS — R0982 Postnasal drip: Secondary | ICD-10-CM | POA: Insufficient documentation

## 2010-07-15 DIAGNOSIS — Z86010 Personal history of colonic polyps: Secondary | ICD-10-CM

## 2010-07-15 LAB — CBC WITH DIFFERENTIAL/PLATELET
Basophils Absolute: 0 10*3/uL (ref 0.0–0.1)
HCT: 39.4 % (ref 39.0–52.0)
Lymphs Abs: 1 10*3/uL (ref 0.7–4.0)
Monocytes Relative: 7.6 % (ref 3.0–12.0)
Neutrophils Relative %: 65.6 % (ref 43.0–77.0)
Platelets: 180 10*3/uL (ref 150.0–400.0)
RDW: 14.6 % (ref 11.5–14.6)
WBC: 4 10*3/uL — ABNORMAL LOW (ref 4.5–10.5)

## 2010-07-15 LAB — HEPATIC FUNCTION PANEL
AST: 19 U/L (ref 0–37)
Alkaline Phosphatase: 86 U/L (ref 39–117)
Bilirubin, Direct: 0.2 mg/dL (ref 0.0–0.3)
Total Protein: 7.1 g/dL (ref 6.0–8.3)

## 2010-07-15 LAB — BASIC METABOLIC PANEL
CO2: 27 mEq/L (ref 19–32)
Calcium: 9.1 mg/dL (ref 8.4–10.5)
GFR: 43.58 mL/min — ABNORMAL LOW (ref >60.00)
Potassium: 4.3 mEq/L (ref 3.5–5.1)
Sodium: 137 mEq/L (ref 135–145)

## 2010-07-15 LAB — IBC PANEL
Saturation Ratios: 23 % (ref 20.0–50.0)
Transferrin: 263.8 mg/dL (ref 212.0–360.0)

## 2010-07-16 LAB — FOLATE: Folate: 18.2 ng/mL (ref >5.9)

## 2010-07-16 LAB — FERRITIN: Ferritin: 37.5 ng/mL (ref 22.0–322.0)

## 2010-07-21 ENCOUNTER — Other Ambulatory Visit: Payer: Self-pay | Admitting: Gastroenterology

## 2010-07-21 ENCOUNTER — Encounter: Payer: Self-pay | Admitting: Gastroenterology

## 2010-07-21 ENCOUNTER — Encounter (AMBULATORY_SURGERY_CENTER): Payer: Medicare Other | Admitting: Gastroenterology

## 2010-07-21 DIAGNOSIS — K2940 Chronic atrophic gastritis without bleeding: Secondary | ICD-10-CM

## 2010-07-21 DIAGNOSIS — K2990 Gastroduodenitis, unspecified, without bleeding: Secondary | ICD-10-CM

## 2010-07-21 DIAGNOSIS — K5730 Diverticulosis of large intestine without perforation or abscess without bleeding: Secondary | ICD-10-CM

## 2010-07-21 DIAGNOSIS — D649 Anemia, unspecified: Secondary | ICD-10-CM

## 2010-07-21 DIAGNOSIS — Z1211 Encounter for screening for malignant neoplasm of colon: Secondary | ICD-10-CM

## 2010-07-21 DIAGNOSIS — D126 Benign neoplasm of colon, unspecified: Secondary | ICD-10-CM

## 2010-07-21 DIAGNOSIS — K2970 Gastritis, unspecified, without bleeding: Secondary | ICD-10-CM | POA: Insufficient documentation

## 2010-07-21 LAB — HM COLONOSCOPY

## 2010-07-24 NOTE — Assessment & Plan Note (Signed)
Summary: Pain and tightness in throat   History of Present Illness Visit Type: Follow-up Visit Primary GI MD: Sheryn Bison MD FACP FAGA Primary Provider: Oliver Barre, MD Chief Complaint: Pain and tightness in throat, possible colon screening History of Present Illness:   Pleasant 76 year old Caucasian male with chronic ENT problems who also complains of intermittent dysphasia despite daily Prilosec therapy. He has suspected pernicious anemia, vascular telangiectasias of his gut, and recurrent colon polyps. His history is complicated by atrial fibrillation and he is on Amidarone 100 mg a day, allopurinol for gout, daily aspirin, and Synthroid under micrograms a day. He denies anorexia, weight loss, change in bowels, melena, hematochezia, or any history of hepatitis or pancreatitis. He quit smoking many years ago but continued use ethanol in moderation allegedly.   GI Review of Systems    Reports dysphagia with solids.      Denies abdominal pain, acid reflux, belching, bloating, chest pain, dysphagia with liquids, heartburn, loss of appetite, nausea, vomiting, vomiting blood, weight loss, and  weight gain.        Denies anal fissure, black tarry stools, change in bowel habit, constipation, diarrhea, diverticulosis, fecal incontinence, heme positive stool, hemorrhoids, irritable bowel syndrome, jaundice, light color stool, liver problems, rectal bleeding, and  rectal pain.    Current Medications (verified): 1)  Amiodarone Hcl 200 Mg Tabs (Amiodarone Hcl) .... 1/2 Daily 2)  Allopurinol 300 Mg Tabs (Allopurinol) .Marland Kitchen.. 1 By Mouth Once Daily 3)  Aspirin 81 Mg Tbec (Aspirin) .... Once Daily 4)  Prilosec Otc 20 Mg Tbec (Omeprazole Magnesium) .... Daily 5)  Synthroid 100 Mcg Tabs (Levothyroxine Sodium) .Marland Kitchen.. 1po Once Daily 6)  Levitra 20 Mg Tabs (Vardenafil Hcl) .Marland Kitchen.. 1 By Mouth Every Other Day As Needed  Allergies (verified): 1)  ! Tetracycline 2)  ! * Tetanus 3)  ! * Shrimp 4)  ! Phenergan 5)   Doxycycline Hyclate (Doxycycline Hyclate)  Past History:  Past medical, surgical, family and social histories (including risk factors) reviewed for relevance to current acute and chronic problems.  Past Medical History: Reviewed history from 06/13/2010 and no changes required. Atrial Fibrillation - non coumadin since 2007 due to anemia Adenomatous Colon Polyps Coronary Artery Disease HyperthyroidismKidney Stones Hypothyroidism Hypertension Hyperlipidemia PSA increased  Anemia-iron deficiency/small AVM's distal duodenum Colonic polyps, hx of - benign GERD Low back pain - chronic/DJD Diverticulosis, colon Gout Renal insufficiency salivary cyst 04/2008 esophageal stricture s/p EGD with dilation 04/2008 left renal artery stenosis < 50% by CT 8/08 Peripheral vascular disease - AAA Nephrolithiasis, hx of Anxiety  Past Surgical History: Reviewed history from 09/19/2008 and no changes required. Aorto-Femoral Bypass x5 - stress test neg 9/08 Coronary artery bypass graft s/p afib ablation  Family History: Reviewed history from 05/03/2008 and no changes required. Family History of Diabetes: Father & Sister Myloma: Mother No FH of Colon Cancer:  Social History: Reviewed history from 09/19/2008 and no changes required. Occupation: Self employed IT consultant Patient is a former smoker.  Alcohol Use - yes -2 Illicit Drug Use - no Married lives with wife  Review of Systems General:  Denies fever, chills, sweats, anorexia, fatigue, weakness, malaise, weight loss, and sleep disorder. ENT:  Complains of tinnitus, decreased hearing, nasal congestion, hoarseness, and difficulty swallowing; denies earache, ear discharge, loss of smell, nosebleeds, and sore throat. Neuro:  Denies weakness, paralysis, abnormal sensation, seizures, syncope, tremors, vertigo, transient blindness, frequent falls, frequent headaches, difficulty walking, headache, sciatica, radiculopathy other:, restless  legs, memory loss, and confusion. Psych:  Denies depression, anxiety, memory loss, suicidal ideation, hallucinations, paranoia, phobia, and confusion. Allergy:  Complains of hay fever; denies hives, rash, sneezing, and recurrent infections.  Vital Signs:  Patient profile:   76 year old male Height:      70 inches Weight:      185.25 pounds BMI:     26.68 Pulse rate:   60 / minute Pulse rhythm:   regular BP sitting:   120 / 68  (left arm) Cuff size:   regular  Vitals Entered By: June McMurray CMA Duncan Dull) (July 15, 2010 3:54 PM)  Physical Exam  General:  Well developed, well nourished, no acute distress.healthy appearing.   Head:  Normocephalic and atraumatic. Eyes:  PERRLA, no icterus.exam deferred to patient's ophthalmologist.   Ears:  Normal auditory acuity.Cloudy tympanic membranes noted bilaterally without evidence of perforation. Nose:  No deformity, discharge,  or lesions. Mouth:  No deformity or lesions, dentition normal. Neck:  Supple; no masses or thyromegaly. Lungs:  Clear throughout to auscultation. Heart:  Regular rate and rhythm; no murmurs, rubs,  or bruits. Abdomen:  Soft, nontender and nondistended. No masses, hepatosplenomegaly or hernias noted. Normal bowel sounds. Rectal:  deferred until time of colonoscopy.   Msk:  Symmetrical with no gross deformities. Normal posture. Extremities:  No clubbing, cyanosis, edema or deformities noted. Neurologic:  Alert and  oriented x4;  grossly normal neurologically. Cervical Nodes:  No significant cervical adenopathy. Psych:  Alert and cooperative. Normal mood and affect.   Impression & Recommendations:  Problem # 1:  POSTNASAL DRIP (ICD-784.91) Assessment Deteriorated Decongestants as prescribed by Dr. Oliver Barre. The patient apparently also in the past has had ENT consultation with Dr. Lazarus Salines. He apparently has included salivary gland duct in the right mandibular area which has previously been aspirated. ENT exam  otherwise today was unremarkable without mucosal lesions.  Problem # 2:  CORONARY ARTERY BYPASS GRAFT, HX OF (ICD-V45.81) Assessment: Comment Only  Problem # 3:  ATRIAL FLUTTER, CHRONIC (ICD-427.32) Assessment: Improved continue cardiac medications per cardiology.  Problem # 4:  COLONIC POLYPS, HX OF (ICD-V12.72) Assessment: Unchanged colonoscopy scheduled at his convenience. Repeat labs including iron levels were ordered. Intrinsic factor antibodies also requested. Orders: TLB-CBC Platelet - w/Differential (85025-CBCD) TLB-BMP (Basic Metabolic Panel-BMET) (80048-METABOL) TLB-Hepatic/Liver Function Pnl (80076-HEPATIC) TLB-TSH (Thyroid Stimulating Hormone) (84443-TSH) TLB-B12, Serum-Total ONLY ([address]-U04) TLB-Ferritin (82728-FER) TLB-Folic Acid (Folate) (82746-FOL) TLB-IBC Pnl (Iron/FE;Transferrin) (83550-IBC) TLB-Magnesium (Mg) (83735-MG) TLB-IgA (Immunoglobulin A) (82784-IGA) T-Sprue Panel (Celiac Disease Aby Eval) (83516x3/[address]) T-Intrinsic Factor Antibodies (54098-11914) Colon/Endo (Colon/Endo)  Problem # 5:  GERD (ICD-530.81) Assessment: Deteriorated Repeat Endoscopy scheduled and I have asked him to take his Prilosec 30-60 minutes before the first meal of the day. He will be screen for Barrett's mucosa, H. pylori infection, and celiac disease. Orders: TLB-CBC Platelet - w/Differential (85025-CBCD) TLB-BMP (Basic Metabolic Panel-BMET) (80048-METABOL) TLB-Hepatic/Liver Function Pnl (80076-HEPATIC) TLB-TSH (Thyroid Stimulating Hormone) (84443-TSH) TLB-B12, Serum-Total ONLY (78295-A21) TLB-Ferritin (82728-FER) TLB-Folic Acid (Folate) (82746-FOL) TLB-IBC Pnl (Iron/FE;Transferrin) (83550-IBC) TLB-Magnesium (Mg) (83735-MG) TLB-IgA (Immunoglobulin A) (82784-IGA) T-Sprue Panel (Celiac Disease Aby Eval) (83516x3/[address]) T-Intrinsic Factor Antibodies (30865-78469) Colon/Endo (Colon/Endo)  Patient Instructions: 1)  Copy sent to : Oliver Barre, MD 2)  Your procedure has  been scheduled for 07/21/2010, please follow the seperate instructions.  3)  [facility] Patient Information Guide given to patient.  4)  Colonoscopy and Flexible Sigmoidoscopy brochure given.  5)  Upper Endoscopy brochure given.  6)  Please go to the basement today for your labs.  7)  Your prescription(s)  have been sent to you pharmacy.  8)  The medication list was reviewed and reconciled.  All changed / newly prescribed medications were explained.  A complete medication list was provided to the patient / caregiver. Prescriptions: MOVIPREP 100 GM  SOLR (PEG-KCL-NACL-NASULF-NA ASC-C) As per prep instructions.  #1 x 0   Entered by:   Harlow Mares CMA (AAMA)   Authorized by:   Mardella Layman MD [facility]   Signed by:   Mardella Layman MD [facility] on 07/15/2010   Method used:   Electronically to        [facility]* (retail)       [address]       [place], [place]  [address]       Ph: [phone]       Fax: [phone]   RxID:   [id number]

## 2010-07-24 NOTE — Letter (Signed)
Summary: [facility] Instructions  [facility]  [address] [place], [place] [address]   Phone: [phone]  Fax: [phone]       Omar Benson    [birth year]    MRN: [id number]        Procedure Day /Date: Monday 07/21/2010     Arrival Time: 1:30pm     Procedure Time: 2:30pm     Location of Procedure:                    X  [facility] (4th Floor)                        PREPARATION FOR COLONOSCOPY WITH MOVIPREP   Starting 5 days prior to your procedure 07/16/2010 do not eat nuts, seeds, popcorn, corn, beans, peas,  salads, or any raw vegetables.  Do not take any fiber supplements (e.g. Metamucil, Citrucel, and Benefiber).  THE DAY BEFORE YOUR PROCEDURE         Sunday 07/20/2010  1.  Drink clear liquids the entire day-NO SOLID FOOD  2.  Do not drink anything colored red or purple.  Avoid juices with pulp.  No orange juice.  3.  Drink at least 64 oz. (8 glasses) of fluid/clear liquids during the day to prevent dehydration and help the prep work efficiently.  CLEAR LIQUIDS INCLUDE: Water Jello Ice Popsicles Tea (sugar ok, no milk/cream) Powdered fruit flavored drinks Coffee (sugar ok, no milk/cream) Gatorade Juice: apple, white grape, white cranberry  Lemonade Clear bullion, consomm, broth Carbonated beverages (any kind) Strained chicken noodle soup Hard Candy                             4.  In the morning, mix first dose of MoviPrep solution:    Empty 1 Pouch A and 1 Pouch B into the disposable container    Add lukewarm drinking water to the top line of the container. Mix to dissolve    Refrigerate (mixed solution should be used within 24 hrs)  5.  Begin drinking the prep at 5:00 p.m. The MoviPrep container is divided by 4 marks.   Every 15 minutes drink the solution down to the next mark (approximately 8 oz) until the full liter is complete.   6.  Follow completed prep with 16 oz of clear liquid of your choice (Nothing red or purple).   Continue to drink clear liquids until bedtime.  7.  Before going to bed, mix second dose of MoviPrep solution:    Empty 1 Pouch A and 1 Pouch B into the disposable container    Add lukewarm drinking water to the top line of the container. Mix to dissolve    Refrigerate  THE DAY OF YOUR PROCEDURE      Monday 07/21/2010  Beginning at 9:30am (5 hours before procedure):         1. Every 15 minutes, drink the solution down to the next mark (approx 8 oz) until the full liter is complete.  2. Follow completed prep with 16 oz. of clear liquid of your choice.    3. You may drink clear liquids until 12:30pm (2 HOURS BEFORE PROCEDURE).   MEDICATION INSTRUCTIONS  Unless otherwise instructed, you should take regular prescription medications with a small sip of water   as early as possible the morning of your procedure.  OTHER INSTRUCTIONS  You will need a responsible adult at least 74 years of age to accompany you and drive you home.   This person must remain in the waiting room during your procedure.  Wear loose fitting clothing that is easily removed.  Leave jewelry and other valuables at home.  However, you may wish to bring a book to read or  an iPod/MP3 player to listen to music as you wait for your procedure to start.  Remove all body piercing jewelry and leave at home.  Total time from sign-in until discharge is approximately 2-3 hours.  You should go home directly after your procedure and rest.  You can resume normal activities the  day after your procedure.  The day of your procedure you should not:   Drive   Make legal decisions   Operate machinery   Drink alcohol   Return to work  You will receive specific instructions about eating, activities and medications before you leave.    The above instructions have been reviewed and explained to me by   _______________________    I fully understand and can verbalize these instructions  _____________________________ Date _________

## 2010-07-29 NOTE — Procedures (Addendum)
Summary: Upper Endoscopy  Patient: Omar Benson Note: All result statuses are Final unless otherwise noted.  Tests: (1) Upper Endoscopy (EGD)   EGD Upper Endoscopy       DONE     [facility]     [address]     [place], [place]  [address]          ENDOSCOPY PROCEDURE REPORT          PATIENT:  Omar Benson, Omar Benson  MR#:  [id number]     BIRTHDATE:  10-07-1934, 75 yrs. old  GENDER:  male          ENDOSCOPIST:  Vania Rea. Jarold Motto, MD, [facility]     Referred by:          PROCEDURE DATE:  07/21/2010     PROCEDURE:  EGD with biopsy for H. pylori 19147     ASA CLASS:  Class III     INDICATIONS:  GERD          MEDICATIONS:   There was residual sedation effect present from     prior procedure., Fentanyl 25 mcg IV, Versed 2 mg IV     TOPICAL ANESTHETIC:  Exactacain Spray          DESCRIPTION OF PROCEDURE:   After the risks benefits and     alternatives of the procedure were thoroughly explained, informed     consent was obtained.  The LB GIF-H180 [id number] endoscope was     introduced through the mouth and advanced to the second portion of     the duodenum, without limitations.  The instrument was slowly     withdrawn as the mucosa was fully examined.     <<PROCEDUREIMAGES>>          Moderate gastritis was found in the total stomach.     ATROPHIC,GRANULAR MUCOSA BIOPSIED AND CLO BX. DONE.  Normal     duodenal folds were noted.  The esophagus and gastroesophageal     junction were completely normal in appearance.    Retroflexed     views revealed a hiatal hernia.  SMALL 2 CM HH NOTED,,,  The scope     was then withdrawn from the patient and the procedure completed.          COMPLICATIONS:  None          ENDOSCOPIC IMPRESSION:     1) Moderate gastritis in the total stomach     2) Normal duodenal folds     3) Normal esophagus     4) A hiatal hernia     ATROPHIC GASTRITIS.R/O INTESTINAL METAPLASIA/DYSPLASIA.     RECOMMENDATIONS:     1) Await biopsy results     2) continue  current medications     3) Rx CLO if positive          REPEAT EXAM:  No          ______________________________     Vania Rea. Jarold Motto, MD, [facility]          CC:  Corwin Levins, MD          n.     Rosalie DoctorMarland Kitchen   Vania Rea. Patterson at 07/21/2010 03:17 PM          Orlin Hilding, [phone]  Note: An exclamation mark (!) indicates a result that was not dispersed into the flowsheet. Document Creation Date: 07/21/2010 3:17 PM _______________________________________________________________________  (1) Order result status: Final Collection or observation date-time: 07/21/2010 15:09 Requested date-time:  Receipt date-time:  Reported date-time:  Referring Physician:   Ordering Physician: Sheryn Bison [phone]) Specimen Source:  Source: Launa Grill Order Number: [id number] Lab site:

## 2010-07-29 NOTE — Miscellaneous (Signed)
Summary: Orders Update clotest  Clinical Lists Changes  Problems: Added new problem of GASTRITIS (ICD-535.50) Orders: Added new Test order of TLB-H Pylori Screen Gastric Biopsy (83013-CLOTEST) - Signed 

## 2010-07-29 NOTE — Procedures (Addendum)
Summary: Colonoscopy  Patient: Omar Benson Note: All result statuses are Final unless otherwise noted.  Tests: (1) Colonoscopy (COL)   COL Colonoscopy           DONE     [facility]     [address]     [place], [place]  [address]          COLONOSCOPY PROCEDURE REPORT          PATIENT:  [name], [name]  MR#:  [id number]     BIRTHDATE:  1934-05-27, 75 yrs. old  GENDER:  male     ENDOSCOPIST:  Vania Rea. Jarold Motto, MD, [facility]     REF. BY:     PROCEDURE DATE:  07/21/2010     PROCEDURE:  Colonoscopy with snare polypectomy     ASA CLASS:  Class III     INDICATIONS:  Routine Risk Screening     MEDICATIONS:   Fentanyl 75 mcg IV, Versed 7 mg IV          DESCRIPTION OF PROCEDURE:   After the risks benefits and     alternatives of the procedure were thoroughly explained, informed     consent was obtained.  Digital rectal exam was performed and     revealed no abnormalities.   The LB CF-H180AL [id number] endoscope     was introduced through the anus and advanced to the cecum, which     was identified by both the appendix and ileocecal valve, without     limitations.  The quality of the prep was adequate, using     MoviPrep.  The instrument was then slowly withdrawn as the colon     was fully examined.     <<PROCEDUREIMAGES>>          FINDINGS:  A sessile polyp was found at the splenic flexure. ONE     CM. FLAT POLYP SNARE EXCISED FROM SPLEENIC FLEXURE.  A sessile     polyp was found in the mid transverse colon. POLYP COLD SNARE     EXCISED FROM DISTAL TV COLON.  Severe diverticulosis was found in     the sigmoid to descending colon segments.  Internal Hemorrhoids     were found.   Retroflexed views in the rectum revealed internal     hemorrhoids.    The scope was then withdrawn from the patient and     the procedure completed.          COMPLICATIONS:  None     ENDOSCOPIC IMPRESSION:     1) Sessile polyp at the splenic flexure     2) Sessile polyp in the mid transverse colon    3) Severe diverticulosis in the sigmoid to descending colon     segments     4) Internal hemorrhoids     R/O ADENOMAS.     RECOMMENDATIONS:     1) Await biopsy results     2) Repeat colonoscopy in 5 years if polyp adenomatous; otherwise     10 years     3) High fiber diet.     REPEAT EXAM:  No          ______________________________     Vania Rea. Jarold Motto, MD, [facility]          CC:  Corwin Levins, MD          n.     Rosalie DoctorMarland Kitchen   Vania Rea. Patterson at 07/21/2010 03:11 PM  Orlin Hilding, [id number]  Note: An exclamation mark (!) indicates a result that was not dispersed into the flowsheet. Document Creation Date: 07/21/2010 3:11 PM _______________________________________________________________________  (1) Order result status: Final Collection or observation date-time: 07/21/2010 14:58 Requested date-time:  Receipt date-time:  Reported date-time:  Referring Physician:   Ordering Physician: Sheryn Bison [phone]) Specimen Source:  Source: Launa Grill Order Number: [id number] Lab site:   Appended Document: Colonoscopy     Procedures Next Due Date:    Colonoscopy: 07/2015

## 2010-08-04 ENCOUNTER — Encounter: Payer: Self-pay | Admitting: Gastroenterology

## 2010-08-14 NOTE — Letter (Signed)
Summary: Patient Notice- Polyp Results  [facility]  [address] [place], [place] [address]   Phone: [phone]  Fax: [phone]        August 04, 2010 MRN: [id number]    OBED SAMEK [address] [place], [place]  [address]    Dear Mr. Gutierrez,  I am pleased to inform you that the colon polyp(s) removed during your recent colonoscopy was (were) found to be benign (no cancer detected) upon pathologic examination.  I recommend you have a repeat colonoscopy examination in 5_ years to look for recurrent polyps, as having colon polyps increases your risk for having recurrent polyps or even colon cancer in the future.  Should you develop new or worsening symptoms of abdominal pain, bowel habit changes or bleeding from the rectum or bowels, please schedule an evaluation with either your primary care physician or with me.  Additional information/recommendations:  _x_ No further action with gastroenterology is needed at this time. Please      follow-up with your primary care physician for your other healthcare      needs.  __ Please call [phone] to schedule a return visit to review your      situation.  __ Please keep your follow-up visit as already scheduled.  __ Continue treatment plan as outlined the day of your exam.  Please call us if you are having persistent problems or have questions about your condition that have not been fully answered at this time.  Sincerely,  Mardella Layman MD [facility]  This letter has been electronically signed by your physician.  Appended Document: Patient Notice- Polyp Results letter mailed

## 2010-08-14 NOTE — Letter (Signed)
Summary: Patient [facility] Biopsy Results  [facility]  [address] [place], [place] [address]   Phone: [phone]  Fax: [phone]        August 04, 2010 MRN: [id number]    Omar Benson [address] [place], [place]  [address]    Dear Mr. Bisson,  I am pleased to inform you that the biopsies taken during your recent endoscopic examination did not show any evidence of cancer upon pathologic examination. biopsies show no evidence of H. pylori infection  Additional information/recommendations:  __No further action is needed at this time.  Please follow-up with      your primary care physician for your other healthcare needs.  __ Please call [phone] to schedule a return visit to review      your condition.  _x_ Continue with the treatment plan as outlined on the day of your      exam.  __ You should have a repeat endoscopic examination for this problem              in _ months/years.   Please call us if you are having persistent problems or have questions about your condition that have not been fully answered at this time.  Sincerely,  Mardella Layman MD [facility]  This letter has been electronically signed by your physician.  Appended Document: Patient Notice-Endo Biopsy Results letter mailed

## 2010-08-18 ENCOUNTER — Telehealth: Payer: Self-pay | Admitting: Internal Medicine

## 2010-08-18 NOTE — Telephone Encounter (Signed)
lmom for pt to call me back

## 2010-08-19 ENCOUNTER — Telehealth: Payer: Self-pay

## 2010-08-19 DIAGNOSIS — R109 Unspecified abdominal pain: Secondary | ICD-10-CM

## 2010-08-19 NOTE — Telephone Encounter (Signed)
Pt rtn call from [phone] thinks he needs to be seen this week

## 2010-08-19 NOTE — Telephone Encounter (Signed)
Pt called stating he has an appt scheduled with  JWJ 04/05 and is requesting to have TSH labs prior. Okay to scheduled 246.9?

## 2010-08-19 NOTE — Telephone Encounter (Signed)
ok 

## 2010-08-19 NOTE — Telephone Encounter (Signed)
Discussed with Dr [name] not real sure what to think of his symptoms.  He did not take his Synthroid today and states he feels better.  He is seeing Dr Jonny Ruiz on Thursday prior to going on his cruise

## 2010-08-19 NOTE — Telephone Encounter (Signed)
Spoke with pt and his dose of Synthroid was increased a month ago.  Dr Jonny Ruiz increased.  Pt did not tell his Dr he had missed doses of medication prior to the increase.  HR 52 beats a min  Previous dose was 100 mcg and he is now on 125 mcg.  If he lefts a chair or does any exertion feels like he can't catch his breath  But can walk on GXT without problems.  Cell # [phone]

## 2010-08-19 NOTE — Telephone Encounter (Signed)
Omar Benson, can you please help me get lab TSH scheduled? Thanks!!

## 2010-08-20 ENCOUNTER — Other Ambulatory Visit: Payer: Medicare Other

## 2010-08-21 ENCOUNTER — Encounter: Payer: Self-pay | Admitting: Internal Medicine

## 2010-08-21 ENCOUNTER — Ambulatory Visit (INDEPENDENT_AMBULATORY_CARE_PROVIDER_SITE_OTHER): Payer: Medicare Other | Admitting: Internal Medicine

## 2010-08-21 VITALS — BP 130/72 | HR 52 | Temp 97.7°F | Ht 70.0 in | Wt 181.1 lb

## 2010-08-21 DIAGNOSIS — I4891 Unspecified atrial fibrillation: Secondary | ICD-10-CM

## 2010-08-21 DIAGNOSIS — E039 Hypothyroidism, unspecified: Secondary | ICD-10-CM

## 2010-08-21 DIAGNOSIS — I10 Essential (primary) hypertension: Secondary | ICD-10-CM

## 2010-08-21 DIAGNOSIS — Z0000 Encounter for general adult medical examination without abnormal findings: Secondary | ICD-10-CM

## 2010-08-21 DIAGNOSIS — Z0001 Encounter for general adult medical examination with abnormal findings: Secondary | ICD-10-CM | POA: Insufficient documentation

## 2010-08-21 DIAGNOSIS — F411 Generalized anxiety disorder: Secondary | ICD-10-CM

## 2010-08-21 MED ORDER — LEVOTHYROXINE SODIUM 100 MCG PO TABS: 100.0000 ug | ORAL_TABLET | Freq: Every day | ORAL | Status: DC

## 2010-08-21 NOTE — Assessment & Plan Note (Signed)
stable overall by hx and exam, most recent lab reviewed with pt, and pt to continue medical treatment as before  Lab Results  Component Value Date   WBC 4.0* 07/15/2010   HGB 13.4 07/15/2010   HCT 39.4 07/15/2010   PLT 180.0 07/15/2010   CHOL 194 12/10/2009   TRIG 217.0* 12/10/2009   HDL 40.60 12/10/2009   LDLDIRECT 114.5 12/10/2009   ALT 13 07/15/2010   AST 19 07/15/2010   NA 137 07/15/2010   K 4.3 07/15/2010   CL 104 07/15/2010   CREATININE 1.7* 07/15/2010   BUN 21 07/15/2010   CO2 27 07/15/2010   TSH 6.31* 07/15/2010   PSA 2.05 12/10/2009

## 2010-08-21 NOTE — Assessment & Plan Note (Signed)
stable overall by hx and exam, most recent lab reviewed with pt, and pt to continue medical treatment as before ; pt reassured, volume and rate ok

## 2010-08-21 NOTE — Assessment & Plan Note (Signed)
Overcontrolled, with original TSH elevated likely related to some noncompliance ;  To decr the thyroid med to original strength, f/u TSH in 4 wks

## 2010-08-21 NOTE — Progress Notes (Signed)
Subjective:    Patient ID: Omar Benson, male    DOB: 10/05/1934, 76 y.o.   MRN: [id number]  HPI  Here to f/u today;  Pt now states that prior to TSH done recently per Dr Jarold Motto, he had missed several doses of thyroid med;  After thyroid med increased in  2 to 3 wks he has had mild agitation, irritability, tremulousness, and increased palpitations out of proportion (hard and faster beating) to walking up a flight of stairs.  Has held off on taking the increased strength of med in the past 3 days with much improvement.   Pt denies chest pain, increased sob or doe, wheezing, orthopnea, PND, increased LE swelling, palpitations, dizziness or syncope.  Pt denies new neurological symptoms such as new headache, or facial or extremity weakness or numbness   Pt denies polydipsia, polyuria.  Denies worsening depressive symptoms, suicidal ideation, or panic or other reason for increased anxiety.   Pt denies fever, night sweats, loss of appetite, or other constitutional symptoms except for wt loss on increased med dose.  Overall o/w good compliance with treatment, and good medicine tolerability.  Past Medical History  Diagnosis Date  . Unspecified disorder of thyroid 12/05/2008  . HYPERLIPIDEMIA 09/19/2008  . GOUT 09/19/2008  . ANEMIA-IRON DEFICIENCY 09/19/2008  . HYPERTENSION 09/19/2008  . CAD 12/05/2008  . Atrial fibrillation 04/19/2008  . ATRIAL FLUTTER, CHRONIC 12/05/2008  . BRADYCARDIA 12/05/2008  . AAA 09/19/2008  . PERIPHERAL VASCULAR DISEASE 09/19/2008  . GERD 09/19/2008  . DIVERTICULOSIS, COLON 09/19/2008  . RENAL INSUFFICIENCY 09/19/2008  . LOW BACK PAIN 09/19/2008  . BACK PAIN 03/03/2010  . Other chest pain 04/06/2008  . CHEST PAIN 03/03/2010  . EPIGASTRIC PAIN 05/03/2008  . FLANK PAIN, LEFT 01/11/2009  . PSA, INCREASED 09/19/2008  . COLONIC POLYPS, HX OF 09/19/2008  . GASTROINTESTINAL HEMORRHAGE, HX OF 04/19/2008  . NEPHROLITHIASIS, HX OF 01/11/2009  . CORONARY ARTERY BYPASS GRAFT, HX OF 12/05/2008  . ANXIETY  06/13/2010  . Postnasal drip 07/15/2010  . GASTRITIS 07/21/2010   Past Surgical History  Procedure Date  . Aorto-femoral bypass x5 - stress test neg 9/08   . Coronary artery bypass graft   . S/p afib ablation     reports that he has quit smoking. He does not have any smokeless tobacco history on file. He reports that he drinks alcohol. He reports that he does not use illicit drugs. family history includes Diabetes in his father and sister. Allergies  Allergen Reactions  . Doxycycline Hyclate     REACTION: unspecified  . Promethazine Hcl   . Tetanus Toxoid     REACTION: Rash  . Tetracycline     REACTION: Esophagitis   Current Outpatient Prescriptions on File Prior to Visit  Medication Sig Dispense Refill  . allopurinol (ZYLOPRIM) 300 MG tablet Take 300 mg by mouth daily.        Marland Kitchen amiodarone (PACERONE) 200 MG tablet Take 200 mg by mouth. 1/2 daily       . aspirin 81 MG tablet Take 81 mg by mouth daily.        Marland Kitchen omeprazole (PRILOSEC OTC) 20 MG tablet Take 20 mg by mouth daily.        . vardenafil (LEVITRA) 20 MG tablet Take 20 mg by mouth. 1 by mouth every other day as needed       . DISCONTD: levothyroxine (SYNTHROID, LEVOTHROID) 100 MCG tablet Take 100 mcg by mouth daily.        Marland Kitchen  DISCONTD: levothyroxine (SYNTHROID, LEVOTHROID) 125 MCG tablet Take 125 mcg by mouth daily.         Review of Systems All otherwise neg per pt     Objective:   Physical Exam BP 130/72  Pulse 52  Temp(Src) 97.7 F (36.5 C) (Oral)  Ht 5\' 10"  (1.778 m)  Wt 181 lb 2 oz (82.158 kg)  BMI 25.99 kg/m2  SpO2 97% Physical Exam  VS noted Constitutional: Pt appears well-developed and well-nourished.  HENT: Head: Normocephalic.  Right Ear: External ear normal.  Left Ear: External ear normal.  Eyes: Conjunctivae and EOM are normal. Pupils are equal, round, and reactive to light.  Neck: Normal range of motion. Neck supple.  Cardiovascular: Normal rate and regular rhythm.   Pulmonary/Chest: Effort normal and  breath sounds normal.  Abd:  Soft, NT, non-distended, + BS Neurological: Pt is alert. No cranial nerve deficit.  Skin: Skin is warm. No erythema.  Psychiatric: Pt behavior is 1-2+ anxious Thought content normal.          Assessment & Plan:

## 2010-08-21 NOTE — Assessment & Plan Note (Signed)
stable overall by hx and exam, most recent lab reviewed with pt, and pt to continue medical treatment as before 

## 2010-08-21 NOTE — Patient Instructions (Signed)
Stop the levothyroxine 125  Start the levothyroxine 100 per day Please return for LAB only in 4 wks  Please return in 6 mo with Lab testing done 3-5 days before

## 2010-09-08 ENCOUNTER — Ambulatory Visit (INDEPENDENT_AMBULATORY_CARE_PROVIDER_SITE_OTHER): Payer: Medicare Other | Admitting: Internal Medicine

## 2010-09-08 ENCOUNTER — Encounter: Payer: Self-pay | Admitting: Internal Medicine

## 2010-09-08 VITALS — BP 176/90 | HR 52 | Ht 70.0 in | Wt 182.0 lb

## 2010-09-08 DIAGNOSIS — I4891 Unspecified atrial fibrillation: Secondary | ICD-10-CM

## 2010-09-08 DIAGNOSIS — R0609 Other forms of dyspnea: Secondary | ICD-10-CM

## 2010-09-08 DIAGNOSIS — I2510 Atherosclerotic heart disease of native coronary artery without angina pectoris: Secondary | ICD-10-CM

## 2010-09-08 DIAGNOSIS — J449 Chronic obstructive pulmonary disease, unspecified: Secondary | ICD-10-CM | POA: Insufficient documentation

## 2010-09-08 NOTE — Assessment & Plan Note (Signed)
He denies palpitations and has no evidence of recurrent arrhythmias. I recommended that we stop his amiodarone today because of his dyspnea. Depending on his symptoms, additional antiarrhythmic medication will be recommended if he develops recurrent atrial fibrillation.

## 2010-09-08 NOTE — Patient Instructions (Signed)
You have been referred to pulmonary for a pulmonary treadmill test. Your physician has recommended that you have a pulmonary function test. Pulmonary Function Tests are a group of tests that measure how well air moves in and out of your lungs.

## 2010-09-08 NOTE — Assessment & Plan Note (Signed)
This is his new problem and is quite bothersome. There are multiple possible etiologies including amiodarone, worsening coronary disease, and worsening lung disease. He has a long-standing tobacco abuse history but quit smoking over 20 years ago. He has not been coughing. Ambulation today with pulse oximetry demonstrated that with vigorous walking his oxygen saturation dropped into the high 70s and his heart rate increased into the mid to high 80s. During self made him feel fatigued. I recommend that that he undergo pulmonary function testing with DLCO and obtain a sedimentation rate. He will followup with pulmonary consultants as well. Ultimately cardiopulmonary stress testing may be required. Pending the results of above right and left heart catheterization may also be recommended. Have asked that the patient stop his amiodarone. Until now he has been well controlled on only 100 mg daily.

## 2010-09-08 NOTE — Assessment & Plan Note (Signed)
He has not had anginal symptoms per se. Pending the results of his previously described testing we may ultimately recommend right and left heart catheterization.

## 2010-09-08 NOTE — Progress Notes (Signed)
HPI Mr. Callicott returns today for followup. He is a very pleasant 76 year old man with a history of coronary artery disease status post bypass surgery. The patient has a history of atrial arrhythmias which have been controlled very nicely on low dose amiodarone. He has had no cough. He notes that over the last several weeks and perhaps even as much as 2 months he has had increasing dyspnea and a sensation that his heart is beating hard. The patient has not had syncope but notes that when he does something will strenuously climbing stairs or lifting something heavy, he feels chest pressure and increasing dyspnea. He feels like his heart is beating "hard". He did not have palpitations. Allergies  Allergen Reactions  . Doxycycline Hyclate     REACTION: unspecified  . Promethazine Hcl   . Tetanus Toxoid     REACTION: Rash  . Tetracycline     REACTION: Esophagitis     Current Outpatient Prescriptions  Medication Sig Dispense Refill  . allopurinol (ZYLOPRIM) 300 MG tablet Take 300 mg by mouth daily.        Marland Kitchen aspirin 81 MG tablet Take 81 mg by mouth daily.        Marland Kitchen levothyroxine (SYNTHROID, LEVOTHROID) 100 MCG tablet Take 1 tablet (100 mcg total) by mouth daily.  90 tablet  3  . omeprazole (PRILOSEC OTC) 20 MG tablet Take 20 mg by mouth daily.        . vardenafil (LEVITRA) 20 MG tablet Take 20 mg by mouth. 1 by mouth every other day as needed       . DISCONTD: amiodarone (PACERONE) 200 MG tablet Take 200 mg by mouth. 1/2 daily          Past Medical History  Diagnosis Date  . Unspecified disorder of thyroid 12/05/2008  . HYPERLIPIDEMIA 09/19/2008  . GOUT 09/19/2008  . ANEMIA-IRON DEFICIENCY 09/19/2008  . HYPERTENSION 09/19/2008  . CAD 12/05/2008  . Atrial fibrillation 04/19/2008  . ATRIAL FLUTTER, CHRONIC 12/05/2008  . BRADYCARDIA 12/05/2008  . AAA 09/19/2008  . PERIPHERAL VASCULAR DISEASE 09/19/2008  . GERD 09/19/2008  . DIVERTICULOSIS, COLON 09/19/2008  . RENAL INSUFFICIENCY 09/19/2008  . LOW BACK PAIN  09/19/2008  . BACK PAIN 03/03/2010  . Other chest pain 04/06/2008  . CHEST PAIN 03/03/2010  . EPIGASTRIC PAIN 05/03/2008  . FLANK PAIN, LEFT 01/11/2009  . PSA, INCREASED 09/19/2008  . COLONIC POLYPS, HX OF 09/19/2008  . GASTROINTESTINAL HEMORRHAGE, HX OF 04/19/2008  . NEPHROLITHIASIS, HX OF 01/11/2009  . CORONARY ARTERY BYPASS GRAFT, HX OF 12/05/2008  . ANXIETY 06/13/2010  . Postnasal drip 07/15/2010  . GASTRITIS 07/21/2010    ROS:   All systems reviewed and negative except as noted in the HPI.   Past Surgical History  Procedure Date  . Aorto-femoral bypass x5 - stress test neg 9/08   . Coronary artery bypass graft   . S/p afib ablation      Family History  Problem Relation Age of Onset  . Diabetes Father   . Diabetes Sister      History   Social History  . Marital Status: Married    Spouse Name: N/A    Number of Children: N/A  . Years of Education: N/A   Occupational History  . self employeed - Contractor    Social History Main Topics  . Smoking status: Former Games developer  . Smokeless tobacco: Not on file  . Alcohol Use: Yes     2  . Drug Use: No  .  Sexually Active: Not on file   Other Topics Concern  . Not on file   Social History Narrative   Married lives with his wife     BP 176/90  Pulse 52  Ht 5\' 10"  (1.778 m)  Wt 182 lb (82.555 kg)  BMI 26.11 kg/m2  Physical Exam:  Well appearing NAD HEENT: Unremarkable Neck:  No JVD, no thyromegally Lymphatics:  No adenopathy Back:  No CVA tenderness Lungs:  Clear HEART:  Regular bradycardia, no murmurs, no rubs, no clicks Abd:  Flat, positive bowel sounds, no organomegally, no rebound, no guarding Ext:  2 plus pulses, no edema, no cyanosis, no clubbing Skin:  No rashes no nodules Neuro:  CN II through XII intact, motor grossly intact  EKG Sinus bradycardia  Assess/Plan:

## 2010-09-09 ENCOUNTER — Encounter: Payer: Self-pay | Admitting: Internal Medicine

## 2010-09-10 ENCOUNTER — Ambulatory Visit (INDEPENDENT_AMBULATORY_CARE_PROVIDER_SITE_OTHER): Payer: Medicare Other | Admitting: Pulmonary Disease

## 2010-09-10 ENCOUNTER — Encounter: Payer: Self-pay | Admitting: Pulmonary Disease

## 2010-09-10 VITALS — BP 130/72 | HR 55 | Temp 97.8°F | Ht 70.0 in | Wt 179.0 lb

## 2010-09-10 DIAGNOSIS — R0609 Other forms of dyspnea: Secondary | ICD-10-CM

## 2010-09-10 DIAGNOSIS — I4891 Unspecified atrial fibrillation: Secondary | ICD-10-CM

## 2010-09-10 LAB — PULMONARY FUNCTION TEST

## 2010-09-10 NOTE — Patient Instructions (Signed)
Trial of spiriva one inhalation each am for next 4 weeks. Would hold off on cardiopulmonary stress test until after this trial unless Dr. [name] feels you need to keep the apptm followup with me in 4 weeks.

## 2010-09-10 NOTE — Progress Notes (Signed)
PFT done today. 

## 2010-09-10 NOTE — Assessment & Plan Note (Addendum)
The pt has chest discomfort and mild increased doe that may be due to obstructive airways disease.  He clearly has mild obstruction by pfts, and suspect is due to mild emphysema from his prior tobacco abuse.  He has been on amiodarone recently, but his DLCO is totally normal.  This has a very high negative predictive value for amiodarone toxicity.  His lung exam today is totally clear.  At this point, I think he should try a bronchodilator regimen for the next 4 weeks to see if he notices a difference.  His symptoms may or may not be due to this.  I would like to see him back in 4 weeks to check on his progress.

## 2010-09-10 NOTE — Progress Notes (Signed)
  Subjective:    Patient ID: Omar Benson, male    DOB: June 28, 1934, 76 y.o.   MRN: [id number]  HPI The pt is a 76 y/o male who I have been asked to see for dyspnea.  The pt recently had an issue with chest pressure, and underwent a stress test that was unremarkable.  He also tells me he had upper endo with nothing of significance being found.  He does have a h/o GERD.  The pt normally is quite active, and tells me he walks on treadmill at an hour.  2 months ago he began to notice a little more doe than usual, but unsure if he was just imagining it in light of his chest pressure.  He has been on amiodarone for arrhythmia, but was taken off recently for concern it may be affecting his lungs.  He denies any cough, congestion, or mucus.  He had recent cxr at urgent care and was told it was "ok".  He denies any h/o asthma, but did have excessive tob abuse until 1984.  He had pfts today which showed mild airflow obstruction, no restriction, and a totally normal DLCO.    Review of Systems  Constitutional: Negative for fever and unexpected weight change.  HENT: Positive for congestion. Negative for ear pain, nosebleeds, sore throat, rhinorrhea, sneezing, trouble swallowing, dental problem, postnasal drip and sinus pressure.   Eyes: Negative for redness and itching.  Respiratory: Positive for shortness of breath. Negative for cough, chest tightness and wheezing.   Cardiovascular: Negative for palpitations and leg swelling.  Gastrointestinal: Negative for nausea and vomiting.  Genitourinary: Negative for dysuria.  Musculoskeletal: Negative for joint swelling.  Skin: Negative for rash.  Neurological: Negative for headaches.  Hematological: Does not bruise/bleed easily.  Psychiatric/Behavioral: Negative for dysphoric mood. The patient is not nervous/anxious.        Objective:   Physical Exam Constitutional:  Well developed, no acute distress  HENT:  Nares patent without  discharge  Oropharynx without exudate, palate and uvula are normal  Eyes:  Perrla, eomi, no scleral icterus  Neck:  No JVD, no TMG  Cardiovascular:  Normal rate, regular rhythm, no rubs or gallops.  No murmurs        Intact distal pulses  Pulmonary :  Normal breath sounds, no stridor or respiratory distress   No rales, rhonchi, or wheezing  Abdominal:  Soft, nondistended, bowel sounds present.  No tenderness noted.   Musculoskeletal:  No lower extremity edema noted.  Lymph Nodes:  No cervical lymphadenopathy noted  Skin:  No cyanosis noted  Neurologic:  Alert, appropriate, moves all 4 extremities without obvious deficit.         Assessment & Plan:

## 2010-09-12 ENCOUNTER — Telehealth: Payer: Self-pay | Admitting: Internal Medicine

## 2010-09-12 NOTE — Telephone Encounter (Signed)
Pt saw Dr Shelle Iron for PFT and he suggested that the test Dr. [name] scheduled Weds. At [facility] be cancelled for a little while he tried a new medication.  Please let him know if this is ok or if Dr. [name] still wants test to be done.

## 2010-09-12 NOTE — Telephone Encounter (Signed)
PT AWARE WILL DISCUSS WITH DR [name] IF PT MAY PROCEED WITH CPX ON WED 09/17/10./CY

## 2010-09-15 ENCOUNTER — Encounter: Payer: Self-pay | Admitting: Pulmonary Disease

## 2010-09-16 ENCOUNTER — Encounter: Payer: Self-pay | Admitting: Internal Medicine

## 2010-09-16 NOTE — Telephone Encounter (Signed)
Ok to cancel cardiopulmonary stress test until new medicine has been evaluated.

## 2010-09-16 NOTE — Telephone Encounter (Signed)
PER PT IS WILLING TO PROCEED WITH CPX .Zack Seal

## 2010-09-17 ENCOUNTER — Ambulatory Visit (HOSPITAL_COMMUNITY): Payer: Medicare Other | Attending: Internal Medicine

## 2010-09-22 ENCOUNTER — Other Ambulatory Visit: Payer: Self-pay | Admitting: Family Medicine

## 2010-09-22 ENCOUNTER — Ambulatory Visit
Admission: RE | Admit: 2010-09-22 | Discharge: 2010-09-22 | Disposition: A | Discharge status: Home / Self Care | Payer: Medicare Other | Source: Ambulatory Visit | Attending: Family Medicine | Admitting: Family Medicine

## 2010-09-22 DIAGNOSIS — R0602 Shortness of breath: Secondary | ICD-10-CM

## 2010-09-22 MED ORDER — IOHEXOL 300 MG/ML  SOLN
100.0000 mL | Freq: Once | INTRAMUSCULAR | Status: AC | PRN
  Administered 2010-09-22: 100 mL via INTRAVENOUS

## 2010-09-23 ENCOUNTER — Encounter: Payer: Self-pay | Admitting: Internal Medicine

## 2010-09-23 ENCOUNTER — Other Ambulatory Visit (INDEPENDENT_AMBULATORY_CARE_PROVIDER_SITE_OTHER): Payer: Medicare Other

## 2010-09-23 ENCOUNTER — Ambulatory Visit (INDEPENDENT_AMBULATORY_CARE_PROVIDER_SITE_OTHER): Payer: Medicare Other | Admitting: Adult Health

## 2010-09-23 ENCOUNTER — Encounter: Payer: Self-pay | Admitting: Adult Health

## 2010-09-23 VITALS — BP 114/76 | HR 57 | Temp 97.4°F | Ht 70.0 in | Wt 180.6 lb

## 2010-09-23 DIAGNOSIS — R0989 Other specified symptoms and signs involving the circulatory and respiratory systems: Secondary | ICD-10-CM

## 2010-09-23 DIAGNOSIS — R0609 Other forms of dyspnea: Secondary | ICD-10-CM

## 2010-09-23 LAB — CBC WITH DIFFERENTIAL/PLATELET
Basophils Absolute: 0 10*3/uL (ref 0.0–0.1)
Eosinophils Absolute: 0.1 10*3/uL (ref 0.0–0.7)
Lymphocytes Relative: 20.7 % (ref 12.0–46.0)
MCHC: 33.9 g/dL (ref 30.0–36.0)
Monocytes Relative: 8.9 % (ref 3.0–12.0)
Neutro Abs: 3.6 10*3/uL (ref 1.4–7.7)
Neutrophils Relative %: 67.2 % (ref 43.0–77.0)
Platelets: 183 10*3/uL (ref 150.0–400.0)
RDW: 13.9 % (ref 11.5–14.6)

## 2010-09-23 NOTE — Patient Instructions (Signed)
I will call with lab results  Give Spiriva more time follow up Dr. Shelle Iron as planned  Please contact office for sooner follow up if symptoms do not improve or worsen or seek emergency care

## 2010-09-23 NOTE — Assessment & Plan Note (Addendum)
Will check labs.  His workup has been unrevealing.  Suggested longer time period to see if spiriva may be helpful  Plan:  Cont spiriva  follow up Clance as planned Labs pending.

## 2010-09-24 ENCOUNTER — Ambulatory Visit (HOSPITAL_COMMUNITY): Payer: Medicare Other | Attending: Internal Medicine

## 2010-09-24 DIAGNOSIS — R0602 Shortness of breath: Secondary | ICD-10-CM

## 2010-09-24 LAB — BRAIN NATRIURETIC PEPTIDE: Pro B Natriuretic peptide (BNP): 139 pg/mL — ABNORMAL HIGH (ref 0.0–100.0)

## 2010-09-24 LAB — SEDIMENTATION RATE: Sed Rate: 10 mm/hr (ref 0–22)

## 2010-09-25 ENCOUNTER — Telehealth: Payer: Self-pay | Admitting: Adult Health

## 2010-09-25 NOTE — Telephone Encounter (Signed)
Inflammatory marker is normal Nothing to explain symptoms Cont w/ follow up Dr. Shelle Iron and As needed  Spoke w/ pt and advised of lab results. Pt verbalized understanding and had no questions. Pt aware to continue w/ follow up w/ [facility]

## 2010-09-28 NOTE — Progress Notes (Signed)
Subjective:    Patient ID: Omar Benson, male    DOB: 06/24/1934, 76 y.o.   MRN: [id number]  HPI The pt is a 76 y/o male who I have been asked to see for dyspnea.  Initial PUlmonary consult 09/10/10  09/10/10 The pt recently had an issue with chest pressure, and underwent a stress test that was unremarkable.  He also tells me he had upper endo with nothing of significance being found.  He does have a h/o GERD.  The pt normally is quite active, and tells me he walks on treadmill at an hour.  2 months ago he began to notice a little more doe than usual, but unsure if he was just imagining it in light of his chest pressure.  He has been on amiodarone for arrhythmia, but was taken off recently for concern it may be affecting his lungs.  He denies any cough, congestion, or mucus.  He had recent cxr at urgent care and was told it was "ok".  He denies any h/o asthma, but did have excessive tob abuse until 1984.  He had pfts today which showed mild airflow obstruction, no restriction, and a totally normal DLCO. >>Trial of spiriva   09/23/10 Acute OV  Pt presents for a work in visit. Complains of increased DOE, wheezing x27months, states is no better since ov w/ KC 2weeks ago.  reports spiriva has not helped w/ symptoms. Does not understand why he is not better since he has been on this for 2 weeks.  Had CT chest yesterday with no acute pulm  findings-neg for PE.    Review of Systems Constitutional:   No  weight loss, night sweats,  Fevers, chills, fatigue, or  lassitude.  HEENT:   No headaches,  Difficulty swallowing,  Tooth/dental problems, or  Sore throat,                No sneezing, itching, ear ache, nasal congestion, post nasal drip,   CV:  No chest pain,  Orthopnea, PND, swelling in lower extremities, anasarca, dizziness, palpitations, syncope.   GI  No heartburn, indigestion, abdominal pain, nausea, vomiting, diarrhea, change in bowel habits, loss of appetite, bloody stools.   Resp: .   No excess mucus, no productive cough,  No non-productive cough,  No coughing up of blood.  No change in color of mucus.     No chest wall deformity  Skin: no rash or lesions.  GU: no dysuria, change in color of urine, no urgency or frequency.  No flank pain, no hematuria   MS:  No joint pain or swelling.  No decreased range of motion.  No back pain.  Psych:  No change in mood or affect. No depression or anxiety.  No memory loss.          Objective:   Physical Exam GEN: A/Ox3; pleasant , NAD, well nourished   HEENT:  [place]/AT,  EACs-clear, TMs-wnl, NOSE-clear, THROAT-clear, no lesions, no postnasal drip or exudate noted.   NECK:  Supple w/ fair ROM; no JVD; normal carotid impulses w/o bruits; no thyromegaly or nodules palpated; no lymphadenopathy.  RESP  Clear  P & A; w/o, wheezes/ rales/ or rhonchi.no accessory muscle use, no dullness to percussion  CARD:  RRR, no m/r/g  , no peripheral edema, pulses intact, no cyanosis or clubbing.  GI:   Soft & nt; nml bowel sounds; no organomegaly or masses detected.  Musco: Warm bil, no deformities or joint swelling noted.  Neuro: alert, no focal deficits noted.    Skin: Warm, no lesions or rashes         Assessment & Plan:

## 2010-09-30 NOTE — Assessment & Plan Note (Signed)
[facility]                         ELECTROPHYSIOLOGY OFFICE NOTE   OCTAVIUS, SHIN                          MRN:          [id number]  DATE:01/31/2007                            DOB:          04/05/1935    [name] returns today for followup.  He is a very pleasant 76 year old  male with a history of coronary artery disease, status post bypass  surgery, history of tobacco abuse, and dyslipidemia, history of sinus  bradycardia, history of atrial fibrillation, status post ablation,  history of atrial fibrillation postoperatively, now well controlled on  amiodarone in low dose.  The patient was recently hospitalized with very  atypical back pain and underwent stress Myoview perfusion study, where  he walked 12 minutes on a Bruce protocol, which was clinically and  electrically negative.  The perfusion was normal.  LV function was  preserved.  Since then, he has been stable.  He denies chest pain or  shortness of breath.   MEDICATIONS:  1. Prilosec 20 a day.  2. Aspirin 81 a day.  3. Synthroid 75 a day.  4. Amiodarone 100 mg daily.  5. Multivitamins.  6. Lipitor 20 mg 1/2 tablet daily.   PHYSICAL EXAMINATION:  He is a pleasant, well-appearing 77 year old man  in no distress.  Blood pressure 110/68, pulse 60 and regular, respirations 16, weight 180  pounds.  NECK:  No jugular venous distention.  LUNGS:  Clear bilaterally to auscultation.  There are no wheezes, rales  or rhonchi are present.  CARDIOVASCULAR:  Regular rate and rhythm with a normal S1 and S2.  EXTREMITIES:  No clubbing, cyanosis or edema.  Pulses are 2+ and  symmetric.  NEUROLOGIC:  Alert and oriented x3.   IMPRESSION:  1. Ischemic heart disease, status post bypass surgery with preserved      left ventricular function.  2. History of atrial flutter, status post ablation.  3. History of postoperative atrial fibrillation.  4. Hypertension.  5. Dyslipidemia.   DISCUSSION:   Overall, Mr. Bolio continues to do quite well, and his  stress test was negative.  I will plan to see him back in one year,  sooner should he have any additional anginal symptoms.    [name]. [name], MD  Electronically Signed   GWT/MedQ  DD: 01/31/2007  DT: 02/01/2007  Job #: [id number]   cc:   [name]. [name], M.D.

## 2010-09-30 NOTE — Assessment & Plan Note (Signed)
[facility]                         ELECTROPHYSIOLOGY OFFICE NOTE   ESPEN, BETHEL                          MRN:          [id number]  DATE:02/17/2008                            DOB:          23-Nov-1934    HISTORY OF PRESENT ILLNESS:  [name] returns today for followup.  He is  a very pleasant 76 year old male with a history of coronary artery  disease status post bypass surgery 5 years ago, he has a history of  atrial flutter status post ablation.  He has a history of remote AFib,  which has been  resolved on low-dose amiodarone.  He has a history of  hypertension.  He has a history of dyslipidemia.  He returns today for  followup.  He has usual host of complaints notably that he has some  soreness in his shoulders and notes that his range of motion in his  shoulders is less than it once was.  He also notes that he has recently  had an elevation of TSH and he has been placed on increasing doses of  Synthroid and he also notes that his PSA was elevated and is scheduled  to have this rechecked in several weeks.  Otherwise, there are no  specific complaints.  He denies chest pain.  He denies shortness of  breath.  He denies peripheral edema.  He remains quite active.   MEDICATIONS:  1. Prilosec 20 a day.  2. Aspirin 81 a day.  3. Synthroid 75 mcg daily.  4. Amiodarone 100 a day.  5. Iron supplements.  6. Vitamin B12 500 mcg daily.  7. Lipitor 10 mg daily.   PHYSICAL EXAMINATION:  GENERAL:  He is a pleasant, well-appearing, man  in no distress.  VITAL SIGNS:  Blood pressure was 144/80, the pulse 53 and regular,  respirations were 18, the weight was 187 pounds.  NECK:  Revealed no jugular venous distention.  LUNGS:  Clear bilaterally to auscultation.  No wheezes, rales, or  rhonchi are present.  There is no increased work of breathing.  CARDIOVASCULAR:  Regular bradycardia with normal S1 and S2.  The PMI was  not enlarged or displaced.  ABDOMEN:  Soft, nontender.  EXTREMITIES:  Demonstrate no edema.   EKG today demonstrates sinus bradycardia with sinus arrhythmia.   IMPRESSION:  1. Asymptomatic bradycardia.  2. Atrial flutter status post ablation.  3. Coronary artery disease status post bypass surgery.  4. Elevated PSA  5. Thyroid dysfunction (asymptomatic).   DISCUSSION:  Mr. Elman is stable.  He has a multitude of medical  problems, but these are all fairly well-controlled.  He is scheduled for  repeat PSA under the direction of Dr. Bradd Canary.  I have asked that he  not change any of his medications today and I will see him back in 1  year.     [name]. [name], MD  Electronically Signed    GWT/MedQ  DD: 02/17/2008  DT: 02/17/2008  Job #: [id number]   cc:   [name]. [name], M.D.

## 2010-09-30 NOTE — Discharge Summary (Signed)
NAME:  Omar Benson, Omar Benson NO.:  [id number]   MEDICAL RECORD NO.:  [id number]          PATIENT TYPE:  OBV   LOCATION:  4729                         FACILITY:  [facility]   PHYSICIAN:  Omar Riffle, MD, FACCDATE OF BIRTH:  May 18, 1935   DATE OF ADMISSION:  01/13/2007  DATE OF DISCHARGE:  01/14/2007                               DISCHARGE SUMMARY   PRIMARY CAREGIVER:  Omar Benson, M.D.   ALLERGIES:  TETANUS, TETRACYCLINE, SEAFOOD, PHENERGAN.   FINAL DIAGNOSES:  1. Admitted with upper back pain associated with knots .  2. Back pain as anginal equivalent in the past.  3. Troponin I. are negative x3.  The patient is ruled out for acute      myocardial infarction.  4. CT of the chest shows no evidence of aortic dissection.  The aorta      is normal caliber.  The heart is normal in size and the great      vessels are widely patent.  There is some linear atelectasis and      scarring in the lung bases bilaterally.  5. CT of the abdomen shows that the distal abdominal aorta is mildly      ectatic, measuring 2.7 cm; no evidence of dissection.  The right      renal artery is widely patent and there is mild narrowing at the      origin of the left renal artery (less than 50%).  The celiac artery      and superior mesenteric artery are widely patent. Mild narrowing at      the origin of the inferior mesenteric artery (at least 50%)  Liver.      Spleen, pancreas, adrenals and kidneys are unremarkable.      Gallbladder and bowel are grossly unremarkable.  No evidence of      aortic dissection or aneurysm.  Mild ectasia distally.  No acute      findings in the abdomen.   SECONDARY DIAGNOSES:  1. History of coronary artery disease.  2. Coronary artery bypass graft surgery in 2004.  Grafts are:      a.     Left internal mammary to the left anterior descending       artery.      b.     Reverse saphenous vein graft to ramus intermediate.      c.     Saphenous vein graft to the  second obtuse marginal.      d.     Saphenous vein graft sequentially to the right coronary       artery, then to the posterolateral branch.  3. Chronic sinus bradycardia.  4. Dyslipidemia.  5. Hypertension.  6. History of atrial flutter with rapid ventricular rate; status post      radiofrequency catheter ablation in 2004.  7. Gastroesophageal reflux disease/diverticulosis.  8. History of colon polyps.  9. History of Coumadin anticoagulation, which was discontinued July      2007 secondary to anemia.   DISCHARGE MEDICATIONS:  The patient is discharging on his preadmission  medications:  1. Enteric-coated aspirin 81 mg daily.  2. Prilosec 20 mg daily.  3. Amiodarone 2 mg tablets 1/2 tablet a day.  4. Allopurinol 300 mg daily.  5. Zocor 10 mg at bedtime.  6. Synthroid 75 mcg daily.  7. Viagra as needed.  8. Vitamin B12.  9. Occasional iron supplementation.   BRIEF HISTORY:  Omar Benson is a 76 year old male.  He presents for  evaluation of back pain, perhaps an angina equivalent.  The patient has  a history of coronary artery disease; before bypass he had back pain.  When he was being evaluated it was found that he had a diseased aorta.  He went on to have a stress test and a cardiac catheterization.  The  catheterization showed three-vessel diffuse coronary artery disease.  He  subsequently underwent coronary artery bypass graft surgery in 2004.   The patient has had intermittent back pain in the past; this has been  controlled with massage and also with Myoflex.   Over the past few weeks he has had on and off back pain.  He did back  exercises, which did not help. On Monday and Tuesday of this week he  walked 2 miles on the treadmill, without any improvement or change in  back pain.  Today, August 28, it was worse.  It became of concern and he  came to the emergency room.  He describes a knot-like sensation  between the scapulae .  Again, Myoflex did not help.  It radiated  around  to his chest intermittently bilaterally 6/10 at the worst.  He received  aspirin and IV nitroglycerin; the pain has gone down, but not completely  gone away.  The patient's family says he had been doing work around the  house putting up drywall, but no real change from what he usually does.   HOSPITAL COURSE:  The patient admitted.  He had serial troponin I  studies done.  A chest x-ray was done and also had a CT of the chest and  a CT of the abdomen -- results as dictated above.  There was no evidence  of aortic dissection.  The patient does have degenerative joint changes  in the thoracic spine without focal bony abnormality.   The patient will also be given a prescription for Robaxin for a 3-day  therapy and also a prescription for Percocet prior to discharge.  He has  a stress test ordered on September 8 at 11:30 am.  He will follow up  with Dr. Ladona Benson as an outpatient.  This appointment will be made prior  to the patient's discharge.   LABORATORY STUDIES THIS ADMISSION:  Hemoglobin 13.9, hematocrit 40.9,  white cells 3.9, platelets 176.  Serum Electrolytes:  BUN 17, creatinine  1.7.  Prothromin Time 13.3, INR 1.0.  Troponin I studies are 0.01, then  0.01, then 0.02. Total cholesterol 111, triglycerides 36, LDL  cholesterol 60, HDL cholesterol 44.  Magnesium 2.  TSH is pending at the  time of discharge.  HGB A1c was 5.4.      Omar Mirza, PA      Omar Riffle, MD, [facility]  Electronically Signed    GM/MEDQ  D:  01/14/2007  T:  01/15/2007  Job:  [id number]   cc:   Omar Benson, M.D.  Omar Cranker, MD, [facility]

## 2010-09-30 NOTE — H&P (Signed)
NAME:  Omar Benson, Omar Benson NO.:  [id number]   MEDICAL RECORD NO.:  [id number]          PATIENT TYPE:  OBV   LOCATION:  4729                         FACILITY:  [facility]   PHYSICIAN:  Pricilla Riffle, MD, FACCDATE OF BIRTH:  1934-10-29   DATE OF ADMISSION:  01/13/2007  DATE OF DISCHARGE:                              HISTORY & PHYSICAL   IDENTIFICATION:  The patient is a 76 year old who presents today for  evaluation of back pain, question angina equivalent.   HISTORY OF PRESENT ILLNESS:  The patient has a history of coronary  artery disease.  Before his bypass, he had back pain.  Was having it  evaluated, found to have a diseased aorta.  Went on to have a stress  test and cardiac catheterization. Catheterization showed diffuse  disease, underwent CABG.   The patient has had intermittent back pain in the past.  He has  controlled this with massage and also with Myoflex.   He reports over the past few weeks he has had on and off back pain.  Tried back exercises, did not help.  On Monday and Tuesday he walked 2  miles on the treadmill without any change in his back pain.  Today it  was worse.  He became concerned, called the office, came to the  emergency room.   When he describes the pain, it is knot-like sensation between his  scapula.  Again, Myoflex did not help.  It radiated around to his chest  intermittently bilaterally, 6 out of 10 at its worst.  He received  aspirin and IV nitroglycerin.  Pain has gone down but is not completely  gone away.   Note the patient's family says he has been doing work around [employer],  Development worker, community, etc. 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   ALLERGIES:  TETANUS, TETRACYCLINE, SEAFOOD, PHENERGAN.   CURRENT MEDICATIONS:  1. Aspirin 81.  2. Prilosec 20.  3. Amiodarone 100 daily.  4. Allopurinol 300.  5. Zocor 10.  6. Synthroid 75 mcg.  7. Myoflex as needed.  8. Viagra as needed.  9. B 12 and iron.   PAST MEDICAL  HISTORY:  1. CAD.  Catheterization 1005 LAD, proximal field via collaterals,      circumflex 75%, prox 75%, mid small vessel. RCA was dominant with      50% proximal, 60% mid, 75% proximal, PDA.  This was 2004.      Underwent LIMA to the LAD, SVG to ramus, SVG to OM2, SVG PA/RCA.  2. Chronic sinus bradycardia.  3. History of atrial flutter status post ablation.  The patient is on      amiodarone low dose.  Atrial fibrillation by report.  No longer on      Coumadin because of anemia.  4. PVCs.  5. GE reflux.  6. Diverticulosis.  7. History of colon polyps.  8. History of dyslipidemia.  9. Hypertension.   SOCIAL HISTORY:  Lives in [place], married, is a Surveyor, minerals.  Quit  tobacco over 20 years ago.  Does not drink.   FAMILY  HISTORY:  Mother died of multiple myeloma at age 25, father died  at age 64 of gangrene.  Grandparents with MI.   REVIEW OF SYSTEMS:  All systems reviewed, negative to the above problem  except as noted above.   PHYSICAL EXAMINATION:  The patient is in no acute distress though notes  back pain 2 out of 10.  Blood pressure on arrival 188/104, respiratory  rate 16, pulse is 58, temperature 97.4.  HEENT:  Normocephalic, atraumatic.  EOMI.  PERL.  Sclerae clear.  Nares  clear.  Throat clear.  Mucous membranes moist.  NECK:  JVP is normal.  No bruits.  No thyromegaly.  LUNGS:  Clear to auscultation without rales or wheezes.  CARDIAC:  Regular rate and rhythm.  S1, S2.  No S3.  No significant  murmurs.  BACK:  Shows some tightening in the right paraspinous area.  Mild  tenderness.  Question, does not seem to reproduce all of pain.  ABDOMEN:  Supple, nontender.  No hepatosplenomegaly.  EXTREMITIES:  No lower extremity edema.   CHEST X-RAY:  No acute disease.  Transversed aorta.  Prominent with  calcification.  Left costophrenic angle blunting.   12-LEAD EKG:  Sinus bradycardia, 54 beats per minute.   Labs significant for a hemoglobin of 13.4, WBC of 3.9, BUN  and  creatinine of 19 and 1.7, potassium 4.8, CK-MB of 104/1.5, troponin is  0.01.   IMPRESSION:  The patient is a 76 year old with known coronary artery  disease.  Notes back pain, mid thoracic, has had in the past prior to  coronary artery bypass graft.  Also has had with muscular strain,  Myoflex and massage will not relieve pain, on and off times 3 weeks.  On  exam, some tenderness in the mid back.  Does not completely bring on all  of discomfort.  EKG is negative.  Labs negative so far.  1. Back pain.  I am not convinced it is an angina equivalent.  With      aortic disease, I am going to rule out dissection though he has 2+      pulses throughout.  If enzymes are negative, possible Myoview and      referral to rehab [facility]) Drs. Ricarda Frame or [employer],      Discussed with family.  2. Dyslipidemia.  Continue medicines.  3. Thyroid.  Continue Synthroid.  4. Atrial arrhythmia.  Continue amiodarone.      Pricilla Riffle, MD, [facility]  Electronically Signed     PVR/MEDQ  D:  01/13/2007  T:  01/13/2007  Job:  [id number]

## 2010-10-03 ENCOUNTER — Other Ambulatory Visit: Payer: Self-pay | Admitting: Internal Medicine

## 2010-10-03 NOTE — Assessment & Plan Note (Signed)
[facility]                           ELECTROPHYSIOLOGY OFFICE NOTE   RUAIRI, STUTSMAN                          MRN:          [id number]  DATE:03/31/2006                            DOB:          07-18-1934    SUBJECTIVE:  Mr. Peaster returns today for followup.  He is a very pleasant 76-  year-old male with coronary disease status post bypass surgery, history of  atrial flutter status post ablation, history of remote postoperative atrial  fibrillation who returns today for followup.  He has had no significant  palpitations.  He denies chest pain or shortness of breath and overall has  done well.  He has tolerated being off Coumadin very nicely secondary to  unexplained anemia thought likely due to occult iron deficiency.  The  patient has subsequently been on iron supplementation with improvement in  his anemia.   OBJECTIVE:  On exam he is a pleasant well-appearing man in no acute  distress.  The blood pressure was 138/78, the pulse 58 and regular,  respirations were 18 and the weight was 181 pounds.  The neck revealed no  jugular venous distention, lungs were clear bilaterally to auscultation,  cardiovascular exam revealed regular rate and rhythm with normal S1 and S2.  Extremities demonstrated no cyanosis, clubbing, edema.  The EKG demonstrates  sinus bradycardia with normal axis and intervals.   IMPRESSION:  1. Coronary artery disease status post bypass surgery.  2. Atrial flutter status post ablation.  3. Postoperative atrial fibrillation.  4. Chronic amiodarone therapy.   DISCUSSION:  Overall, Mr. Ates is stable.  He has maintained sinus rhythm  very nicely on 100 mg a day of amiodarone.  We will plan on seeing him back  in the office in 1 year.     [name]. [name], MD  Electronically Signed    GWT/MedQ  DD: 03/31/2006  DT: 03/31/2006  Job #: [id number]

## 2010-10-03 NOTE — Assessment & Plan Note (Signed)
[facility]                           GASTROENTEROLOGY OFFICE NOTE   VIRGAL, WARMUTH                          MRN:          [id number]  DATE:12/15/2005                            DOB:          1934/10/29    Mr. Klugh has had a third GI workup and has had negative endoscopy and  colonoscopy exams in May 2007.  He does have diverticulosis.  He underwent  entero-capsule testing completed on November 27, 2005, that showed some small  AVMs in the distal duodenum and proximal jejunum and distal jejunum.   1.  The patient was very anemic while on Coumadin.  He is currently off the      Coumadin.  2.  He takes aspirin 81 mg a day.  3.  In addition to Prilosec 20 mg a day.  4.  Synthroid 75 mcg a day.  5.  Zyloprim 300 mg a day.  6.  __________  100 mg a day.  7.  B12 500 mcg a day.   His last hemoglobin, on November 05, 2005, was 10.7 with microcytic indices.   PHYSICAL EXAMINATION:  GENERAL:  Today, he appears awake and alert in no  acute distress.  VITAL SIGNS:  His weight is 180 pounds and blood pressure is 126/70.  Pulse  was 72 and regular.  General physical exam was not repeated at this time.   ASSESSMENT:  Mr. 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  He  seems  to be doing well at this time, off of Coumadin therapy.   RECOMMENDATIONS:  I will send him by the lab to check followup CBC and iron  studies and we will determine whether or not he need to continue on chronic  iron supplementation.  I think his GI workup otherwise at this time is  complete.                                   Vania Rea. Jarold Motto, MD, [facility], [place]   DRP/MedQ  DD:  12/15/2005  DT:  12/15/2005  Job #:  [id number]   cc:   [name]. [name], MD  [name]. [name], MD

## 2010-10-03 NOTE — Consult Note (Signed)
NAME:  ESTILL, LLERENA                             ACCOUNT NO.:  [id number]   MEDICAL RECORD NO.:  [id number]                   PATIENT TYPE:  INP   LOCATION:  2399                                 FACILITY:  [facility]   PHYSICIAN:  Salvatore Decent. Dorris Fetch, M.D.         DATE OF BIRTH:  09-06-1934   DATE OF CONSULTATION:  03/06/2003  DATE OF DISCHARGE:                                   CONSULTATION   REASON FOR CONSULTATION:  Three vessel coronary artery disease.   HISTORY OF PRESENT ILLNESS:  Mr. Meiner is a 76 year old gentleman. He had  questionable exertional chest pain while walking approximately two miles  back in 1998; this was evaluated with a stress Cardiolite by Dr. Royetta Asal at  that time, but apparently that showed no significant ischemia. He cut back  on his physical activities and really has not had recurrence of exertional  symptoms.   He does have a history of gastric reflux; this is mainly at night or after  eating heavy meals. That has resolved since being started on Prilosec.   He recently had been having back pain and this was often associated with  physical exertion, but responded to topical treatment with massage and heat.  He was concerned that he might have a back problem. He was seen and  evaluated by Dr. Hurshel Keys. CT and MRI scans were performed. The CT scan  showed some degenerative changes in his spine, however, he was also noted to  have coronary calcification. He discussed this with Dr. [name] and was  referred for cardiology evaluation.   He had a stress Cardiolite done which showed significant anterior wall  ischemia. Today he underwent cardiac catheterization which revealed severe  three vessel coronary artery disease with preserved left ventricular  function.   PAST MEDICAL HISTORY:  His past medical history is significant for chronic  back pain, dyslipidemia, gastroesophageal reflux, gout, colon polyps.   ADMISSION MEDICATIONS:  1. Crestor 10 mg  daily.  2. Prilosec 20 mg daily.  3. Zyloprim 300 mg daily.  4. Enteric coated aspirin one daily.  5. Tums Extra Strength one daily.   ALLERGIES:  TETANUS, TETRACYCLINE, SEAFOOD.   FAMILY HISTORY:  His family history is noncontributory. There is no history  of premature coronary disease.   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 He does  not smoke or drink. He is married.   REVIEW OF SYMPTOMS:  He has had some tinnitus. He has a history of  tonsillitis. He denies any orthopnea, paroxysmal nocturnal dyspnea,  peripheral edema. He has had some varicose veins, but no symptoms from them.  He denies any claudication, or any tendency to bleed or bruise easily. He  has no stroke or TIA symptoms. All other symptoms are negative.   PHYSICAL EXAMINATION:  GENERAL:  Mr. Vanoverbeke is a 76 year old gentleman in no  acute distress. Generally, he is well-developed and well-nourished.  VITAL SIGNS:  Blood pressure is 145/85, pulse is 60 and regular,  respirations are 16.  HEENT:  Unremarkable.  NECK:  Supple with no thyromegaly, adenopathy or bruit.  CARDIAC:  Regular rate and rhythm, normal S1 and S2, there is an S4. There  is no murmur or rub.  LUNGS:  Clear with equal breath sounds bilaterally.  ABDOMEN:  Soft and nontender.  EXTREMITIES:  Without clubbing, cyanosis or edema. He has 2+ pulses  throughout.  SKIN:  Warm, pink and dry.   LABORATORY DATA:  CBC:  Platelets 245,000, hematocrit 43.  His sodium is  138, potassium 4.5, BUN and creatinine 16 and 1.3, glucose was 102. PT 13.1,  PTT 30. His cardiac enzymes remarkable for troponin of 0.05.   IMPRESSION:  Mr. Cooler is a 76 year old gentleman. He presented for a workup  of back pain; this is not clearly an anginal equivalent, but during his  workup, he was seen to have coronary calcifications on CT scan. He  subsequently had a stress test  which showed a large area of myocardium at  risk. He then underwent cardiac catheterization today which showed severe  three vessel disease including total occlusion of his left anterior  descending artery, filling via collaterals, and an ejection fraction of 65%.   Coronary artery bypass grafting is indicated for survival benefit and  myocardial preservation. He has no clear symptoms, so symptom relief is  uncertain. Much of his back pain appears to be musculoskeletal in origin and  his reflux symptoms are already being controlled with Prilosec.   I discussed with the patient and his wife the indications, risks, benefits,  and alternatives, which in this case would be medical therapy. He and his  wife understand that coronary artery bypass grafting conveys survival  benefit versus medical therapy. They understand the risks of surgery include  but are not limited to death 1%, stroke 1%, myocardial infarction, deep  venous thrombosis, pulmonary embolism, bleeding, possible need for  transfusion, infections, as well a other organ system dysfunction, including  respiratory, renal or gastrointestinal complications. He understands and  accepts these risks and agrees to proceed with surgery.   I did inform him that due to the diffuse nature of disease in his left  anterior descending artery as well as in the posterior descending, that he  would be at increased risk for potential early graft failure in these  vessels. All the patient's questions as well as his wife's questions were  answered. He will be scheduled for surgery, first case in the morning.                                               Salvatore Decent Dorris Fetch, M.D.    SCH/MEDQ  D:  03/06/2003  T:  03/07/2003  Job:  [id number]   cc:   Cecil Cranker, M.D.   [name] [name], M.D.  [address], [address]  [place]  [place] [address]  Fax: [phone]

## 2010-10-03 NOTE — H&P (Signed)
NAME:  Omar Benson, Omar Benson NO.:  [id number]   MEDICAL RECORD NO.:  [id number]                   PATIENT TYPE:  INP   LOCATION:                                       FACILITY:  [facility]   PHYSICIAN:  Cecil Cranker, M.D.             DATE OF BIRTH:  Feb 16, 1935   DATE OF ADMISSION:  03/05/2003  DATE OF DISCHARGE:                                HISTORY & PHYSICAL   This is the first [place] admission for this very  pleasant 76 year old married white male with long history of posterior  thoracic pain.   The patient had a standard treadmill exercise electrocardiogram in 1998 by  Dr. Jimmy Footman.  He initially had a CT scan of his thoracic which revealed  calcification in the course of the coronary arteries, therefore, was  referred for a stress Cardiolite.  The stress Cardiolite revealed abnormal  EKG and Cardiolite imaging with perfusion defect in the apex and anterior  lateral region.   The patient also experienced some discomfort during the exercise and is  actually having some mild discomfort at the present time.   His general health has been good.  He has had some gastric reflux symptoms.  He was hospitalized for food poisoning once in the past.   ALLERGIES:  He has allergies to TETANUS, TETRACYCLINE.   MEDICATIONS:  Zyloprim, Prilosec, Crestor 10 started approximately a week  ago, and aspirin 81.   PAST MEDICAL HISTORY:  He has had colon polyps in the past and is due for a  colonoscopy per Dr. Victorino Dike in the near future.  He has also had history  of hypercholesterolemia.  He has not had hypertension or diabetes.  He also  has history of PVCs.   SOCIAL HISTORY:  He does not smoke or drink.   REVIEW OF SYMPTOMS:  Generally unremarkable except ENT.  He has some ringing  in the ears and has had tonsillitis in the past.  CARDIORESPIRATORY:  Negative as stated above.  GI:  Negative except as in PE.  GENITOURINARY:   Negative.   FAMILY HISTORY:  Mother died at 26 of multiple myeloma.  Father died at 24  with gangrene.  Grandparents died of heart attack.   PHYSICAL EXAMINATION:  VITAL SIGNS:  Blood pressure 150/98 right arm, 150/72  left arm, pulse 68 and normal sinus rhythm.  Temperature is normal.  NECK:  JVP is not elevated.  Carotid pulse palpated without bruit.  LUNGS:  Clear.  CARDIOVASCULAR:  S4, no murmur.  ABDOMEN:  Unremarkable. Liver, spleen and kidneys not palpable.  EXTREMITIES:  Normal.  Pulses palpable and equal bilaterally.  There is no  edema.   A resting EKG is normal, with stress there was significant ST abnormality.   IMPRESSION:  1. Coronary artery disease with angina pectoris and abnormal stress     Cardiolite.  2. Hypertension.  3. Hyperlipidemia.  4. Gastroesophageal reflux disease.   With the abnormal stress test and exertional posterior chest discomfort, I  have suggested the patient be admitted for cardioangiography.   He is agreeable to this approach.                                                Cecil Cranker, M.D.    EJL/MEDQ  D:  03/05/2003  T:  03/05/2003  Job:  [id number]   cc:   [name]. [name], M.D.  [address], [address]  [place]  [place] [address]  Fax: [phone]

## 2010-10-03 NOTE — Discharge Summary (Signed)
NAME:  Omar Benson, Omar Benson NO.:  [id number]   MEDICAL RECORD NO.:  [id number]                   PATIENT TYPE:  INP   LOCATION:  2029                                 FACILITY:  [facility]   PHYSICIAN:  Salvatore Decent. Dorris Fetch, M.D.         DATE OF BIRTH:  Mar 05, 1935   DATE OF ADMISSION:  03/05/2003  DATE OF DISCHARGE:                                 DISCHARGE SUMMARY   PRIMARY ADMITTING DIAGNOSIS:  Chest pain.   ADDITIONAL/DISCHARGE DIAGNOSES:  1. Severe three-vessel coronary artery disease.  2. Dyslipidemia.  3. Gastroesophageal reflux.  4. Gout.  5. History of colon polyp.  6. History of chronic back pain.   PROCEDURES PERFORMED:  1. Cardiac catheterization.  2. Coronary artery bypass grafting x5 (left internal mammary artery to the     LAD, saphenous vein graft to the ramus intermedius, saphenous vein graft     to the second obtuse marginal, sequential saphenous vein graft to the     distal right coronary and distal posterior descending arteries).  3. Endoscopic vein harvest, right thigh.   HISTORY:  The patient is a 76 year old male who over the past several years  has had episodic exertional chest pain.  He apparently was evaluated with a  stress Cardiolite in 1998 by Dr. Areta Haber but was found to have no  significant ischemia.  He cut back on his physical activities and since that  time has not had recurrence of exertional symptoms.  Recently, however, he  has begun to have posterior thoracic pain which was associated with  exertion.  He has been treating it conservatively at home with topical heat  and massage therapy.  The symptoms persisted and he eventually was seen by  Dr. Jerl Santos.  A CT scan and MRI were performed.  CT scan showed some  degenerative changes in his spine; however, he was also noted to have  coronary calcifications.  Because of this he was referred to Dr. Glennon Hamilton  for cardiac evaluation.  A stress Cardiolite showed  significant anterior  wall ischemia.  It was recommended that he undergo cardiac catheterization  and this was done as an outpatient on March 05, 2003.   HOSPITAL COURSE:  He was brought in for outpatient cardiac catheterization  on March 05, 2003.  He was found to have severe three-vessel coronary  artery disease which was not felt to be amenable to percutaneous  intervention.  He did have well-preserved left ventricular function with an  ejection fraction of 65%.  It was recommended that a cardiothoracic surgery  consultation be obtained.  The patient saw Dr. Dorris Fetch and it was agreed  that surgical revascularization was the best option.  Therefore, he was  admitted and scheduled for CABG surgery.  He was taken to the operating room  on October 20 where he underwent CABG x5 as described in detail above.  He  tolerated the procedure  well and was transferred to the SICU in stable  condition.  He was extubated shortly after surgery.  He hemodynamically  stable on postoperative day #1 although he was maintained on renal dose  dopamine and Neo-Synephrine drip.  His creatinine bumped up to 1.8 on  postoperative day #1 from a baseline of 1.5.  The dopamine was continued and  he was started on diuresis.  His chest tubes were removed, he was mobilized,  but remained in the unit for observation.  Over the next 24 hours he was  weaned off the dopamine drip.  He also had an anemia with hemoglobin of 8.1  which was treated with a transfusion of packed red blood cells.  This  brought his hemoglobin up to 8.9 with a hematocrit of 26.  On postoperative  day #2 he was weaned from the dopamine drip as his creatinine had remained  stable and his urine output was good and his blood pressure was stable.  Later that day he was able to be transferred to the floor.  Overall he has  done well postoperatively.  He was maintained initially on external pacer  but after the discontinuation of his pressor  agents and close observation  this was able to be discontinued.  Since that time he has maintained normal  sinus rhythm with rates in the 80s.  He has remained afebrile and all vital  signs have been stable.  He has been walking the halls with cardiac rehab  phase 1 as well as independently and is doing very well.  His labs have  remained fairly stable with his most recent hemoglobin at 8.5, hematocrit  24.5.  His creatinine has returned back to baseline at 1.5 with a BUN of 16.  He has been aggressively diuresed although he is still approximately 15  pounds above his preoperative weight.  His surgical incision sites are all  healing well.  He is still on 1-2 liters of supplemental oxygen and is  maintaining O2 saturations of 95% or greater on room air.  It is expected  that over the next 24 hours he will be weaned from supplemental oxygen and  this will be discontinued.  If he is able to be weaned from the oxygen he is  otherwise doing well.  It is anticipated that he will ready for discharge  home on March 15, 2003.   DISCHARGE MEDICATIONS:  1. Enteric-coated aspirin 325 mg daily.  2. Lopressor 25 mg b.i.d.  3. Lasix 40 mg daily x2 weeks.  4. K-Dur 20 mEq daily x2 weeks.  5. Crestor 10 mg daily.  6. Prilosec 20 mg daily.  7. Zyloprim 300 mg daily.  8. Tylox one to two q.4h. p.r.n. for pain.   DISCHARGE INSTRUCTIONS:  1. He is to refrain from driving, heavy lifting, or strenuous activity.  2. He may continue daily walking and use of his incentive spirometer.  3. He may shower daily and clean his incisions with soap and water.  4. He will continue a low fat/low sodium diet.   DISCHARGE FOLLOW-UP:  He will make an appointment to see Dr. Corinda Gubler in two  weeks and a chest x-ray will be performed at that visit.  He will then  follow up with Dr. Dorris Fetch on Friday, April 06, 2003 at 1:15 p.m.  He will bring his chest x-ray to this visit for Dr. Dorris Fetch to review.  He  will  call our office in the interim if he experiences any problems  or has  any questions.      Coral Ceo, P.A.                        Salvatore Decent Dorris Fetch, M.D.    GC/MEDQ  D:  03/14/2003  T:  03/14/2003  Job:  [id number]   cc:   [name]. [name], M.D.  [address], [address]  [place]  [place] [address]  Fax: [phone]   [name], M.D.

## 2010-10-03 NOTE — Cardiovascular Report (Signed)
NAME:  IVAAN, LIDDY NO.:  [id number]   MEDICAL RECORD NO.:  [id number]                   PATIENT TYPE:  INP   LOCATION:  4737                                 FACILITY:  [facility]   PHYSICIAN:  Vida Roller, M.D.                DATE OF BIRTH:  1934/06/16   DATE OF PROCEDURE:  03/06/2003  DATE OF DISCHARGE:                              CARDIAC CATHETERIZATION   PRIMARY CARDIOLOGIST:  Cecil Cranker, M.D.   PRIMARY CARE PHYSICIAN:  Rodolph Bong, M.D.   HISTORY OF PRESENT ILLNESS:  Mr. Imbert is a 76 year old gentleman with  typical chest discomfort, hypertension, hyperlipidemia, and former tobacco  abuse who presents for evaluation to the [facility]  complaining of chest discomfort.  Had a perfusion study which showed a  significant anterior defect with ischemia, and he was  referred for elective  heart catheterization.   DETAILS OF THE PROCEDURE:  After obtaining informed consent, the patient was  brought to the cardiac catheterization laboratory in the fasting state.  There he was prepped and draped in the usual sterile manner, and anesthesia  was obtained over the right groin using 1% lidocaine without epinephrine.  The right femoral artery was cannulated using the modified Seldinger  technique with a 6-French 10 cm sheath, and left heart catheterization was  performed using a 6-French Judkins left #4, a 6-French Judkins right #4, and  a 6-French angled pigtail catheter.  The left ventriculogram was imaged in  the RAO view at 30 degrees with an injection of 10 ml a second for a total  of 30 ml at 1/2 second rate of rise, 600 psi.  At the conclusion of the  procedure, the catheters were removed.  The patient was moved back to the  cardiology holding area where the femoral artery sheath was removed.  Hemostasis was obtained using direct manual pressure.  At the conclusion of  the hold, there was no evidence of ecchymosis or  hematoma formation, and  distal pulses were intact. Total fluoroscopic time was 4 minutes.  Total  contrast was 100 ml.   RESULTS:  1. Aortic pressure 129/65 with a mean arterial pressure of 92 mmHg.  2. Left ventricular pressure 129/7 with an end-diastolic pressure of 12     mmHg.  3. Selective coronary angiography.     a. The left main coronary artery is a large caliber vessel which is        angiographically normal.     b. The left anterior descending coronary artery is a moderate caliber        vessel which is occluded in its most proximal portion and fills via        collaterals from the right system.  It is poorly imaged distal to the        occlusion.     c. The left circumflex  coronary artery is a small nondominant vessel with        a 75% stenosis in its proximal portion and a 75% stenosis in its mid        portion.  There is a large ramus intermedius branch which is 95%        occluded in its proximal portion and feeds most of the anterolateral        wall.     d. The right coronary artery is a large dominant vessel with a 50%        stenosis in its proximal portion and 60% stenosis just prior to the        crux.  The posterior descending coronary artery is a moderate        bifurcating vessel which has a 75% stenosis in its proximal portion.  4. The left ventriculogram reveals normal left ventricular function,     estimated ejection fraction of 65%, no wall motion abnormalities and no     mitral regurgitation.   These films were reviewed with both Dr. Corinda Gubler and Dr. Riley Kill.   ASSESSMENT:  This is a patient with three-vessel coronary disease and normal  left ventricular function.  Our assessment is this is a gentleman who would  benefit most significantly from bypass surgery, and we will refer him for  that as an inpatient.                                               Vida Roller, M.D.    JH/MEDQ  D:  03/06/2003  T:  03/06/2003  Job:  [id number]   cc:   Cecil Cranker, M.D.   Rodolph Bong, M.D.  [address] [place], [place] [address]  Fax: [phone]

## 2010-10-03 NOTE — Discharge Summary (Signed)
NAME:  Omar Benson, Omar Benson                             ACCOUNT NO.:  [id number]   MEDICAL RECORD NO.:  [id number]                   PATIENT TYPE:  INP   LOCATION:  2006                                 FACILITY:  [facility]   PHYSICIAN:  Cecil Cranker, M.D.             DATE OF BIRTH:  11-24-1934   DATE OF ADMISSION:  03/20/2003  DATE OF DISCHARGE:  03/24/2003                           DISCHARGE SUMMARY - REFERRING   DISCHARGE DIAGNOSES:  1. On admission symptomatic atrial fibrillation/flutter, rapid ventricular     response in a patient who is status post coronary artery bypass graft     surgery 13 days prior to this admission.  2. Status post radiofrequency ablation of atrial flutter (isthmus block) on     March 21, 2003.  3. Sinus rhythm on discharge after amiodarone load, now on maintenance     Cordarone.  4. Supratherapeutic Coumadin after two doses with concurrent amiodarone.  5. Elevated TSH, normal T4, and borderline low T3.  6. Thrombophlebitis left forearm after infiltrated IV.   SECONDARY DIAGNOSIS:  1. Coronary artery disease, status post coronary artery bypass graft surgery     on March 07, 2003, with postoperative hypotension necessitating     inotropic pressor support and external pacer for three days after     surgery.  2. Dyslipidemia.  3. Gastroesophageal reflux disease.  4. Gout.  5. History of colon polyps.  6. Chronic back pain.   PROCEDURE:  1. March 21, 2003, radiofrequency isthmus block by [name]. [name], M.D.  2. Pulmonary function tests with DLCO. This study is pending.   DISPOSITION:  The patient is ready for discharge on November 6, after  undergoing radiofrequency isthmus ablation of his symptomatic atrial  flutter.  For the last 24 hours, he has maintained sinus rhythm without  symptoms.  His blood pressure this hospitalization has been persistently low  and therefore his Lopressor which was 25 mg b.i.d. on admission has been cut  back to 12.5 mg  b.i.d.  He has successfully completed IV amiodarone load  with oral amiodarone to continue as an outpatient.  He was also started on  Coumadin and has been supratherapeutic with an INR of 4.6 after two doses.  His INR today on discharge is 4.1 after holding his Coumadin on the night of  November 5.  He is counseled to follow up with his primary care giver, [name]. Kindl, M.D., for elevated TSH.  His TSH this hospitalization was 8.510.  His T4 was 7.4, T3 2.2.   DISCHARGE MEDICATIONS:  1. Lopressor 50 mg 1/4 tablet in the morning and 1/4 tablet in the evening.     He is actually quite concerned about taking this medication and he has     been told to hold off on taking it if his heart rate is less than 60 and     if his blood  pressure is less than 100.  He is going to get a blood     pressure machine and he is taking his heart rate before every dose of     Lopressor.  Also, he goes home on amiodarone 200 mg tablets, two tablets     three times a day at breakfast, lunch, and dinner from November 5, to     November 9, then he tapers to two tablets twice a day from November 10 to     November 16, and then two tablets daily started November 17 and     following.  He has been given a prescription for 2 mg, but he is not to     take any Coumadin until he sees Dr. [name] on Monday, November 8, at     the [facility].  2. Prilosec 20 mg daily.  3. Zyloprim 300 mg daily.  4. Crestor 10 mg daily.  5. Keflex 500 mg b.i.d. for thrombophlebitis of the left forearm.  6. For pain, he is to take Tylenol 325 mg one to two tablets every four to     six hours as needed.   ACTIVITY:  Walk daily to keep up his strength.   DIET:  Low sodium and low cholesterol diet.   He may shower. He is to avoid heavy lifting for the next week.   FOLLOW UP:  He is to present to the [facility] Clinic on Monday, March 26, 2003, at noon to see Dr. [name].  He has an office visit with  [name], M.D. on Friday, November 16, at 2:30 p.m. with a chest x-ray and he  will see [name]. [name], M.D. on Tuesday, November 23, at 11:45 in the  morning.  He also has an appointment to see Dr. [name] regarding thyroid  studies.   HISTORY OF PRESENT ILLNESS:  The patient is a 76 year old male who underwent  left heart catheterization on March 05, 2003, after having abnormal  Cardiolite study.  His symptoms have been chest pain with exertion.  Left  heart catheterization showed severe three vessel coronary artery disease  with preserved left ventricular function, ejection fraction measured at 65%.  The patient underwent coronary artery bypass graft surgery on October 20.  In the postoperative period, he was hypotensive and was maintained on IV  Dopamine and external pacer, but he did not have extended periods of ectopy  in the postoperative period.  He was discharged from [facility]  five days ago and has done well until this morning when he noted rapid  pronounced beating in his chest which caused lightheadedness.  He has never  had any problems like this in the postoperative period before and was  basically just finishing breakfast when this happened.  He presented to the  [name] emergency room where he had several more episodes of  this pronounced dysrhythmia which the patient found unsettling.  Electrocardiograms in the emergency room first showed sinus rhythm, then he  did lapse into atrial bigeminy with production of these symptoms.  He was  later found to have lapsed into atrial flutter with a rate of 122 beats per  minute. This dysrhythmia was transient as have all of his rhythms.  The  patient will be admitted to telemetry.  He will be started on IV amiodarone.  Cardiac enzymes will be taken every eight hours x3 and he will be also started on IV  heparin.  The patient may be a candidate for radiofrequency  ablation if the dysrhythmia persists  after amiodarone loading and  maintenance.  We will also continue him on his low dose beta blocker.   HOSPITAL COURSE:  After admission to [facility] on November 2 with  symptomatic post coronary artery bypass graft surgery atrial flutter, the  patient was maintained on IV heparin and started on IV amiodarone which he  continued for a 36-hour load. He was seen at [facility] by [name].  [name], M.D., electrophysiologist, who recommended possible radiofrequency  ablation of this new onset and symptomatic atrial flutter.  The patient  agreed to this and the procedure was performed on November 3 with creation  of an isthmus block.  The patient was converted to a regular rate and rhythm  except for occasional bursts of atrial fibrillation the day after the  procedure, maintained solid sinus rhythm for the past 48 hours, November 5  and November 6.  The patient has been started on Coumadin this  hospitalization which rapidly became supratherapeutic with an INR of 4.6 on  November 5 and coasting down to an INR of 4.1 on November 6.  He goes home  with a Coumadin prescription, but not to take any Coumadin until he sees Dr.  [name] at [facility] on Monday, November 8.   LABORATORY DATA:  TSH 8.510, T4 7.5 in a range of 5.0 to 12.5, T3 is 2.2 in  a range of 2.3 to 4.2.  Complete blood count this hospitalization on  admission; hemoglobin 9.5, hematocrit 28.5, white count 6.7, platelets 456.  Serum electrolytes on November 2, the day of admission; sodium 137,  potassium 4.0, chloride 105, BUN 22, creatinine 1.4, glucose 121.  Cardiac  enzymes were obtained.  Troponin studies on November 2, at 4 o'clock were  0.08, November 2 at 2350 hours, 0.10, November 3 at 8:15 a.m., 0.03.   Diagnostic electrocardiogram no November 2, at 1400 hours showing atrial  flutter with variable AV block at 122 beats per minute.      Maple Mirza, Anders Simmonds, M.D.    GM/MEDQ  D:  03/24/2003  T:  03/24/2003  Job:  [id number]   cc:   [name]. [name], M.D.  [address], [address]  [place]  [place] [address]  Fax: [phone]   [name]. [name], M.D.   [facility]

## 2010-10-03 NOTE — Letter (Signed)
March 31, 2006    [name]. [name], M.D.  [address], [address]  [place], [place] [address]   RE:  SWADE, SHONKA  MRN:  [id number]  /  DOB:  April 10, 1935   Dear Rosanne Ashing:   It was a pleasure to see your nice patient, Kesley Gaffey, for followup on  March 31, 2006, nearly 3 years post-op CABG by Dr. Jamesetta Orleans.  At  that time, the left internal mammary artery was anastomosed to the LAD, the  saphenous vein graft to the ramus, the saphenous vein graft to the second OM  and sequential saphenous vein graft to the distal RCA and distal posterior  descending. The patient has done quite well with no recurrent symptoms. He  also post-op had atrial flutter and underwent atrial flutter ablation by Dr.  Lewayne Bunting. He had some recurrent atrial fibrillation. However, now has  maintained normal sinus rhythm on amiodarone 100 mg daily. His Coumadin was  discontinued in June 2007.  The patient has no cardiac symptoms and feeling  quite well. He is on Prilosec 20, aspirin 81, Synthroid 75, Zyloprim,  amiodarone 100, iron supplement, B12. He recently discontinued his Lipitor.  His LDL is up to 100. His recent BUN was 25, creatinine 1.8.   He also was worked up by GI because of anemia, this was while on Coumadin.  He was thought to have chronic blood loss and some small vascular  telangiectasia.   PHYSICAL EXAMINATION:  Blood pressure is 130/78, pulse is 58 in normal sinus  rhythm.  General appearance is normal. JVP is not elevated. Carotid pulse palpable  and equal without bruits.  Lungs are clear.  Cardiac exam is normal.  Abdominal exam is unremarkable.  Extremities are normal.   IMPRESSION:  Diagnoses as above. Electrocardiogram shows sinus bradycardia,  otherwise normal.   I suggested that the patient continue on the same therapy, except restart  his simvastatin at 20 mg daily. He should have lipid, LFTs, BMP and a  hemoglobin in 6 weeks.   I will plan to see him again in 6 months  or any other time you so desire.  Thanks for the opportunity of sharing this nice gentleman's care.    Sincerely,      [name], MD, [facility]  Electronically Signed    EJL/MedQ  DD: 03/31/2006  DT: 03/31/2006  Job #: [id number]

## 2010-10-03 NOTE — Telephone Encounter (Signed)
LMOM for pt to call back to see if amiodarone was d/c'd per Dr. Bruna Potter note on 08/2010.

## 2010-10-03 NOTE — Op Note (Signed)
NAME:  BELFORD, PASCUCCI NO.:  [id number]   MEDICAL RECORD NO.:  [id number]                   PATIENT TYPE:  INP   LOCATION:                                       FACILITY:  [facility]   PHYSICIAN:  [name]. [name], M.D.               DATE OF BIRTH:  1935-04-02   DATE OF PROCEDURE:  DATE OF DISCHARGE:  03/24/2003                                 OPERATIVE REPORT   __________   PREOPERATIVE DIAGNOSIS:  __________ flutter.   PROCEDURE:   SURGEON:  [name]. [name], M.D.   INTRODUCTION:  The patient is a 76 year old male with a history of coronary  artery disease status bypass surgery.  He presents to the hospital two weeks  after surgery.  He had no arrhythmias preoperatively or postoperatively.  He  now is admitted to the hospital with recurrent symptomatic atrial flutter  with rapid ventricular response.  After discussion of the possible treatment  options including long-term anticoagulation with Coumadin in conjunction  with long-term amiodarone versus proceeding with catheter ablation of his  atrial flutter and short term Coumadin therapy with short term amiodarone,  the patient has elected to proceed with catheter ablation to cure his atrial  flutter.   DESCRIPTION OF PROCEDURE:  After informed consent was obtained, the patient  was taken to the diagnostic EP Lab in the fasting state.  After the usual  preparation and draping, intravenous fentanyl and midazolam were given for  conscious sedation.  A 6 French hexapolar catheter was inserted  percutaneously in the right jugular vein and advanced to the coronary sinus.  A 7 French 24 halo catheter was inserted percutaneously in the right femoral  vein and advanced into the right atrium.  A 5 French quadripolar catheter  was inserted percutaneously in the right femoral vein and advanced to the  his bundle region. After measurement at basic intervals, mapping was carried  out as the patient was in atrial  flutter at the time of the procedure.  Catheter manipulation in the usual atrial flutter isthmus spontaneously  terminated atrial flutter.  Prior to termination of the flutter, the cycle  length was 253 msec.  Next, programmed atrial stimulation was carried  out  from the coronary sinus and had a pacing cycle length of 500 msec.  The S1  and S2 interval was stepwise decreased from 40 msec down to 360 msec where  the AV node ERP was observed.  Next, rapid atrial pacing was carried out  from the coronary sinus with a pacing sinus length of 490 msec and stepwise  decreased down to 220 msec resulting in the initiation of atrial flutter.  The AV Wenckebach cycle length was 390 msec.  Mapping was then carried out  in atrial flutter.  This demonstrated typical counterclockwise tricuspid  annular reentry with a cycle length of 254 msec.  The activation  sequence  was earliest in the proximal CS versus the distal CS and counterclockwise  around the tricuspid annulus. At this point, the ablation catheter was  advanced into the right atrium and a single RF energy application delivered  resulting in termination of atrial flutter and restoration of sinus rhythm.  A second RF energy application was then delivered and resulted in the  creation of isthmus block.  Six bonus RF energy applications were delivered,  and the patient observed for 40 minutes.  During this time he had no  recurrent evidence of atrial flutter or atrial flutter isthmus conduction.  The patient did have a very brief and transient episode of atrial  fibrillation which was not sustained.  At this point, rapid ventricular  pacing was carried out by way of the ablation catheter in the right  ventricle and this demonstrated VA dissociation at 600 msec.  The catheter  was then removed.  Hemostasis was assured, and the patient returned to his  room in satisfactory condition.   COMPLICATIONS:  There were no immediate procedure  complications.   RESULTS:  1. Baseline 12-lead electrocardiogram:  The baseline 12-lead EKG     demonstrates atrial flutter with variable AV conduction but typically     2:1.  2. Baseline line intervals:  Prior to RF energy application, the HV interval     was 56 msec.  At the start of the study, the HV flutter cycle length was     253 msec.  The QRS duration was 80 msec.  3. Rapid atrial pacing was carried out from the RV apex demonstrating VA     dissociation and the pacing cycle length of 600 msec.  4. Rapid atrial pacing was carried out from the coronary sinus at  a pacing     sinus length of 490 msec and stepwise decreased down to 390 msec, where     the AV Wenckebach was observed.  Additional decrements down to 220 msec     resulted in the initiation of atrial flutter.  5. Programmed atrial stimulation was carried out from the coronary sinus and     had a baseline  cycle length of 500 msec.  The S1 and S2 interval was     stepwise decreased down to 360 msec where the AV node ERP was observed.     During programmed atrial stimulation, there was no inducible SVT.  6. Arrhythmia observed:     a. Atrial flutter.  Spontaneous and during rapid atrial pacing, duration        sustained.  Cycle length of 256 msec.  Termination with catheter        ablation and catheter manipulation.  7. RF energy application.  A total of 8 RF energy applications were     delivered.  During the first RF energy application, atrial flutter was     terminated and sinus rhythm restored.  During the second RF energy     application, atrial fibrillation isthmus block was demonstrated.  Six     bonus RF energy applications were delivered.   CONCLUSION:  This study demonstrates successful electrophysiologic study and  RF catheter ablation of typical atrial flutter with a total of 8 RF energy  applications delivered to the usual atrial flutter isthmus which resulted in the termination of atrial flutter,  restoration of sinus rhythm, and creation  of bidirectional block in the atrial flutter isthmus.  [name]. [name], M.D.    GWT/MEDQ  D:  03/21/2003  T:  03/22/2003  Job:  [id number]   cc:   Cecil Cranker, M.D.   [name] [name], M.D.  [address]  [place]  [place] [address]  Fax: [phone]   Delrae Rend  [facility]

## 2010-10-03 NOTE — H&P (Signed)
NAME:  Omar Benson, Omar Benson                             ACCOUNT NO.:  [id number]   MEDICAL RECORD NO.:  [id number]                   PATIENT TYPE:  INP   LOCATION:  1831                                 FACILITY:  [facility]   PHYSICIAN:  Salvatore Decent. Dorris Fetch, M.D.         DATE OF BIRTH:  1934-11-02   DATE OF ADMISSION:  03/20/2003  DATE OF DISCHARGE:                                HISTORY & PHYSICAL   CARDIOLOGIST:  Cecil Cranker, M.D.   CARDIAC SURGEON:  Salvatore Decent. Dorris Fetch, M.D.   PRIMARY CARE PHYSICIAN:  [name]. Kindl, M.D.   CHIEF COMPLAINT:  I feel a pounding in my chest, which started today.   HISTORY OF PRESENT ILLNESS:  The patient is a 76 year old male who underwent  a left heart catheterization on March 05, 2003, after an abnormal  Cardiolite study.  He had been having chest pain with exertion.  The left  heart catheterization showed severe three-vessel coronary artery disease  with preserved left ventricular function, with an ejection fraction measured  at 65%.  The patient had a coronary artery bypass graft surgery x5 on  March 07, 2003, by Dr. Dorris Fetch.  In the postoperative period he did  well.  He was maintained until postoperative day number three on IV dopamine  with an external pacer for hypotension, but he did not have any extended  periods of ectopy in the postoperative period.  He was discharged from [facility] five days ago, and has done well until this  morning, when he noted rapid pronounced beating in his chest, which caused  lightheadedness.  He has never had any problems like this in the  postoperative period before, and was basically just finishing breakfast when  this happened.  He presented to the [facility]  Emergency Room, where he had several more episodes of this pronounced  dysrhythmia, which the patient found unsettling.  Electrocardiograms done in  the emergency room at first showed a sinus rhythm;  however, he did lapse  into atrial bigeminy with the production of the symptoms which initially  brought him into the hospital.  He was later found to have lapsed into  atrial flutter with a rate of 122 beats per minute.  This was transient, as  have all of his dysrhythmias.  They have all been transient.   CARDIAC INTERVENTION:  1. Left heart catheterization in October 2004.  The left main is okay.  The     left anterior descending is proximally occluded at 100%.  The left     circumflex has tandem 75% stenoses after the ramus intermediate.  The     right coronary artery has a proximal 50% stenosis and a 60% stenosis     prior to the crux.  The PDA has a 75% proximal stenosis.  The ramus     intermediate has a 95% proximal stenosis.  The ejection fraction is 65%.     No wall motion abnormalities.  No mitral regurgitation.  2. On March 07, 2003, a coronary artery bypass graft surgery x5.  The five     bypasses placed were the left internal mammary artery to the LAD, a     reverse saphenous vein graft to the ramus intermediate, reverse saphenous     vein graft to the second obtuse marginal, and a sequential reverse     saphenous vein graft of the distal right coronary artery to the distal     posterior descending.   ALLERGIES:  TETANUS TOXOID, SHELL FISH AND TETRACYCLINE, which produced  esophagitis.   CURRENT MEDICATIONS:  1. Enteric-coated aspirin 325 mg daily.  2. Lopressor 25 mg b.i.d.  3. Lasix 40 mg daily for two weeks.  4. Potassium chloride 20 mEq daily for two weeks.  5. Crestor 10 mg daily at bedtime.  6. Prilosec 20 mg daily.  7. Zyloprim 300 mg daily.   PAST MEDICAL HISTORY:  1. History of coronary artery disease, status post coronary artery bypass     graft surgery on March 07, 2003.  2. Dyslipidemia.  3. Gastroesophageal reflux disease.  4. Gout.  5. History of colon polyps.  6. Chronic back pain.   SOCIAL HISTORY:  The patient lives near [place] with his  wife.  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  He has a history of  remote tobacco use, having quit 20 years ago.  He does not partake of  alcoholic beverages or recreational drugs.   FAMILY HISTORY:  Mother died at age 30 of multiple myeloma.  Father died at  age 93.  He was a diabetic and had developed gangrene.  He has one sister  alive and well.  One sister living, also with diabetes.   REVIEW OF SYSTEMS:  The patient does not complain of fever, chills, or  sweats.  He is not having vertigo, photophobia, vision or hearing loss.  He  is not having chest pain, shortness of breath, dyspnea on exertion,  orthopnea, paroxysmal nocturnal dyspnea, or peripheral edema.  He does,  however, pointedly feel palpitations when they occur, and these have been  documented on his electrocardiogram here at the emergency room.  He has not  experienced pre-syncope or syncope.  No lower extremity claudication, cough,  or wheezing.  The patient has not been experiencing progressive fatigue in  the last couple of weeks, or in the period after his coronary artery bypass  graft surgery.  He has no history of reflux symptoms or peptic ulcer  disease.  No history of GI bleed.  He has no prior history of diabetes,  thyroid disease, or cerebrovascular accident, pulmonary embolus, or deep  vein thrombosis.   PHYSICAL EXAMINATION:  GENERAL:  The patient is not having chest pain.  He  is in no acute distress.  He feels a pronounced heart rate with  dysrhythmias.  VITAL SIGNS:  Temperature 97.5 degrees, pulse variable.  Once again the  cardiac rhythm varies from sinus rhythm to atrial bigeminy to atrial  flutter.  Respirations 18, blood pressure 90/55.  Oxygen saturation is 97%  on room air.  HEENT:  Atraumatic.  Eyes:  Pupils equal, round, reactive to light.  Extraocular movements intact.  Mucous membranes are pink and moist, without  erythema. NECK:  Supple, no carotid bruits auscultated.  No  jugular venous distention.  No cervical lymphadenopathy.  HEART:  A predominantly  regular rate and rhythm, alternating with atrial  bigeminy and irregularity of atrial flutter.  LUNGS:  Clear to auscultation and percussion bilaterally.  He has a freshly  healing midline sternotomy, without evidence of inflammation.  EXTREMITIES:  There is evidence of right lower extremity saphenous vein  graft harvest which is healing well.  No evidence of clubbing, cyanosis, or  edema.  There are no joint deformities or effusions.  ABDOMEN:  Soft, nondistended.  Bowel sounds present.  No rebound or  guarding.  No hepatosplenomegaly.  GENITOURINARY:  Examination has been deferred.  RECTAL:  Examination has been deferred.  BACK:  He does have chronic lower back pain.  NEUROLOGIC:  He is alert and oriented x3.  Cranial nerves II-XII are grossly  intact.  He has normal grip strength 5/5 in all extremities.  He moves them  appropriately.  VASCULAR STUDIES:  Radial pulses 4/4 bilaterally.  Dorsalis pedis pulses are  4/4 bilaterally.  He does complain of tingling sensations in both feet after  the surgery.  A chest x-ray is not taken this hospitalization.  Baseline electrocardiogram:  Sinus rhythm, rate of 68.  He also has  paroxysmal episodes of atrial bigeminy with adequate compensation, and he is  also found to be in atrial flutter when his heart rate is elevated, with  rates of 122-130.   LABORATORY DATA:  Complete blood count:  White cells 6.7, hemoglobin 9.5,  hematocrit 28.5, platelets 456.  Sodium 137, potassium 4, chloride 105,  bicarbonate 22, BUN 22, creatinine 1.4, glucose 121.   IMPRESSION:  1. Admitted with atrial flutter.  2. History of abnormal Cardiolite study.  3. History of coronary artery disease, status post coronary artery bypass     graft surgery on March 07, 2003.  4. Dyslipidemia.  5. Gastroesophageal reflux disease.  6. Gout.  7. Chronic low back pain.   PLAN:  Admit to  telemetry.  Start on IV amiodarone.  Check cardiac enzymes  q.8h. x3, also initiate IV heparin.  This patient may be a candidate for a  ventral radiofrequency ablation, if dysrhythmia persists after amiodarone  loading and treatment.  He will continue on his low-dose beta blocker.      Maple Mirza, P.A.                    Salvatore Decent Dorris Fetch, M.D.    GM/MEDQ  D:  03/20/2003  T:  03/20/2003  Job:  [id number]

## 2010-10-03 NOTE — Op Note (Signed)
NAME:  Omar Benson, Omar Benson                             ACCOUNT NO.:  [id number]   MEDICAL RECORD NO.:  [id number]                   PATIENT TYPE:  INP   LOCATION:  2314                                 FACILITY:  [facility]   PHYSICIAN:  Salvatore Decent. Dorris Fetch, M.D.         DATE OF BIRTH:  13-Oct-1934   DATE OF PROCEDURE:  03/07/2003  DATE OF DISCHARGE:                                 OPERATIVE REPORT   PREOPERATIVE DIAGNOSES:  Severe three-vessel coronary disease.   POSTOPERATIVE DIAGNOSES:  Severe three-vessel coronary disease.   OPERATION PERFORMED:  Median sternotomy, extracorporeal circulation,  coronary artery bypass grafting times five (left internal mammary artery to  left anterior descending, saphenous vein graft to ramus intermediate,  saphenous vein graft to obtuse marginal 2, sequential saphenous vein graft  to distal right coronary and distal posterior descending), endoscopic vein  harvest, right leg.   SURGEON:  Salvatore Decent. Dorris Fetch, M.D.   ASSISTANT:  Toribio Harbour, N.P.   ANESTHESIA:  General.   FINDINGS:  LAD, ramus intermedius good quality targets, obtuse marginal fair  quality, distal right and posterior descending poor quality, good quality  conduits.   INDICATIONS FOR PROCEDURE:  Omar Benson is a 76 year old gentleman.  He has  been evaluated for back pain with a CT scan which showed calcification of  his coronary arteries.  He underwent a Cardiolite which showed significant  anterior wall ischemia and subsequently underwent catheterization which  revealed three-vessel coronary disease with preserved left ventricular  function.  The patient was referred for coronary artery bypass grafting.  The indications, risks, benefits and alternatives were discussed in detail  with the patient and he understood and accepted the risks of bypass surgery  and agreed to proceed.   DESCRIPTION OF PROCEDURE:  Omar Benson was brought to the preop holding area on  March 07, 2003.   Lines were placed to monitor arterial, central venous and  pulmonary arterial pressure.  The patient was taken to the operating room,  anesthetized and intubated.  Intravenous antibiotics were administered.  A  Foley catheter was placed.  The chest, abdomen and legs were then prepped  and draped in the usual fashion.   A median sternotomy was performed.  The left internal mammary artery was  harvested using the standard technique.  This was difficult due to limited  flexibility of the patient but the mammary artery was of good quality.  Simultaneously, incision was made in the medial aspect of the right leg at  the level of the knee.  The greater saphenous vein was harvested from the  right thigh endoscopically.  Additional vein was harvested from the upper  calf using open technique.  Saphenous vein was of good quality.   The patient was fully heparinized prior to dividing the distal end of the  mammary artery.  The pericardium was opened.  The ascending aorta was  inspected and palpated.  The  aorta was cannulated via concentric 2-0  Ethibond pledgeted pursestring sutures.  A dual stage venous cannula was  placed via pursestring suture in the right atrial appendage.  Cardiopulmonary bypass was instituted and the patient was cooled to 32  degrees Celsius.  The coronary arteries were inspected and anastomotic sites  were chosen.  The conduits were inspected and cut to length.  A foam pad was  placed in the pericardium to protect the left phrenic nerve.  A temperature  probe was placed in the myocardial septum and a cardioplegia cannula was  placed in the ascending aorta.   The aorta was crossclamped.  The left ventricle was emptied via the aortic  root vent.  Cardiac arrest then was achieved with a combination of cold  antegrade blood cardioplegia and topical iced saline.  After achieving a  complete diastolic arrest and myocardial septal temperature of 14 degrees  Celsius, the following  distal anastomoses were performed.   First a reversed saphenous vein graft was placed end-to-side to the ramus  intermedius.  This was a 2 mm good quality target.  The saphenous vein was  of good quality.  The anastomosis was performed with a running 7-0 Prolene  suture.  There was excellent flow through the graft.  Cardioplegia was  administered.  There was good hemostasis and additional cooling of the  septum to 11 degrees Celsius.   Next a reversed saphenous vein graft was placed end-to-side to the obtuse  marginal #2.  This was the termination of circumflex which was a relatively  small vessel that had __________ 75% stenoses.  The OM2 was a 1 mm vessel  that was grafted high in the AV groove.  It was of fair quality.  The vein  graft was of fair quality as there was some narrowing of the vein at the  proximal end.  The anastomosis was performed with a running 7-0 Prolene  suture.  There was adequate flow through the graft.  Cardioplegia was  administered.  There was good hemostasis.   Next, a reversed saphenous vein graft placed sequentially to the distal  right coronary and the distal posterior descending.  The patient had  diffusely diseased right coronary.  A side-to-side anastomosis was performed  to the distal right coronary beyond the take off of the posterior  descending.  This was performed off the side branch with a vein graft with a  running 7-0 Prolene suture. This was a 1.5 mm poor quality target.  The  distal end of the vein then was prepared and anastomosed to the distal  posterior descending which was a 1 mm poor quality target.  The posterior  descending itself was diffusely diseased.  Both of these anastomoses were  probed proximally and distally to ensure patency.  There was excellent flow  through the graft.  Cardioplegia was administered.  There was good  hemostasis.   Next, the left internal mammary artery was brought through a window in the pericardium.  The  distal end was spatulated.  It was a 1.5 mm good quality  conduit.  It was anastomosed end-to-side to the distal LAD which was a 1.5  mm good quality target.  The LAD was totally occluded proximally.  The end-  to-side anastomosis was performed with a running 8-0 Prolene suture.  At the  completion of the mammary to LAD anastomosis, the bulldog clamp was removed  from the mammary artery.  Immediate and rapid  septal rewarming was noted.  The  bulldog clamp was replaced.  The mammary pedicle was tacked to the  epicardial surface of the heart with 6-0 Prolene sutures.  Additional  cardioplegia was administered.   The vein grafts were cut to length and cardioplegia cannula was removed from  the ascending aorta.  The proximal vein graft anastomoses were performed to  4.4 mm punch aortotomies with a running 6-0 Prolene sutures, while under  cross-clamp.  At the completion of the final proximal anastomosis, lidocaine  was administered.  The patient was being rewarmed.  The patient was placed  in Trendelenburg position.  The bulldog clamp was again removed from the  mammary artery.  Immediate and rapid septal rewarming was again noted.  After deairing the aortic root completely, the aortic crossclamp was  removed.  The total crossclamp time was 79 minutes.  The patient required a  single defibrillation with 20 joules.  He then remained in sinus rhythm.   During rewarming, all proximal and distal anastomoses were inspected for  hemostasis.  Epicardial pacing wires were placed on the right ventricle and  right atrium and when the patient had been rewarmed to a core temperature of  37 degrees Celsius, he was weaned from cardiopulmonary bypass.  He was DOO  paced for rate at time of separation from bypass.  The total bypass time was  165 minutes.  The initial cardiac index was greater than 2L per minute per  meter squared and the patient remained hemodynamically stable throughout the  postbypass  period.  The test dose of protamine was administered and was well  tolerated.  The atrial and aortic cannulae were removed.  The remainder of  the protamine was administered without incident.  The chest was irrigated  with 1L of warm normal saline containing 1 gm of vancomycin.  The  pericardium was reapproximated with interrupted 3-0 silk sutures. It came  together easily without tension.  A left pleural and two mediastinal chest  tubes were placed through separate subcostal incisions.  The sternum was  closed with heavy gauge interrupted stainless steel wires.  The remainder of  the incisions were closed in standard fashion with subcuticular closures  used on the skin.  All sponge, needle and instrument counts were correct at  the end of the procedure.  The patient was taken from the operating room to  the surgical intensive care unit in critical but stable condition.                                               Salvatore Decent Dorris Fetch, M.D.   SCH/MEDQ  D:  03/07/2003  T:  03/08/2003  Job:  [id number]   cc:   Cecil Cranker, M.D.   [name] [name], M.D.  [address]  [place]  [place] [address]  Fax: [phone]

## 2010-10-06 NOTE — Telephone Encounter (Signed)
Pt called back and dr [name] did dc his amiodarone because he was having lung problems that was thought to be caused by the med, however he had a lung test to determine

## 2010-10-08 ENCOUNTER — Ambulatory Visit (INDEPENDENT_AMBULATORY_CARE_PROVIDER_SITE_OTHER): Payer: Medicare Other | Admitting: Pulmonary Disease

## 2010-10-08 ENCOUNTER — Encounter: Payer: Self-pay | Admitting: Pulmonary Disease

## 2010-10-08 VITALS — BP 112/72 | HR 60 | Temp 97.7°F | Ht 70.0 in | Wt 182.8 lb

## 2010-10-08 DIAGNOSIS — R0609 Other forms of dyspnea: Secondary | ICD-10-CM

## 2010-10-08 MED ORDER — TIOTROPIUM BROMIDE MONOHYDRATE 18 MCG IN CAPS: 18.0000 ug | ORAL_CAPSULE | Freq: Every day | RESPIRATORY_TRACT | Status: DC

## 2010-10-08 NOTE — Assessment & Plan Note (Signed)
The pt has mild copd by his pfts, but it is unclear if this is the true etiology for his doe.  He has had a positive response to spiriva, and I have asked him to continue with this and get back to his usual exertional routine. If he continues to do well from a symptom and functional standpoint, will assume his obstructive lung disease was responsible for his complaints.

## 2010-10-08 NOTE — Progress Notes (Signed)
  Subjective:    Patient ID: Omar Benson, male    DOB: 1934-05-27, 76 y.o.   MRN: [id number]  HPI The pt comes in for f/u of his exertional dyspnea.  At the last visit, he was found to have mild airflow obstruction on pfts most c/w copd given his past smoking history.  He was started on spiriva as a trial, and today feels this has helped his breathing.  He thinks he can do more with his arms/carrying than he could do before, and denies any cough or congestion.  He states that he has had a recent cpst, with results unavailable currently.    Review of Systems  Constitutional: Negative for fever and unexpected weight change.  HENT: Positive for congestion, sneezing, trouble swallowing and sinus pressure. Negative for ear pain, nosebleeds, sore throat, rhinorrhea, dental problem and postnasal drip.   Eyes: Negative for redness and itching.  Respiratory: Negative for cough, chest tightness, shortness of breath and wheezing.   Cardiovascular: Negative for palpitations and leg swelling.  Gastrointestinal: Negative for nausea and vomiting.  Genitourinary: Negative for dysuria.  Musculoskeletal: Negative for joint swelling.  Skin: Negative for rash.  Neurological: Negative for headaches.  Hematological: Does not bruise/bleed easily.  Psychiatric/Behavioral: Negative for dysphoric mood. The patient is not nervous/anxious.        Objective:   Physical Exam Wd male in nad Chest with clear bs, no wheezing or rhonchi Cor sounds regular today  LE with no edema, no cyanosis noted.  Alert, oriented, moves all 4        Assessment & Plan:

## 2010-10-08 NOTE — Patient Instructions (Signed)
Stay on spiriva Get back to your usual lifestyle and see how your breathing is doing.  If you are not satisfied can consider a 4 week trial of an inhaled corticosteroid as we discussed followup with me in 3 mos.

## 2010-10-21 ENCOUNTER — Other Ambulatory Visit: Payer: Self-pay | Admitting: Internal Medicine

## 2010-10-21 DIAGNOSIS — I714 Abdominal aortic aneurysm, without rupture: Secondary | ICD-10-CM

## 2010-10-22 ENCOUNTER — Encounter (INDEPENDENT_AMBULATORY_CARE_PROVIDER_SITE_OTHER): Payer: Medicare Other | Admitting: Cardiology

## 2010-10-22 DIAGNOSIS — I714 Abdominal aortic aneurysm, without rupture: Secondary | ICD-10-CM

## 2010-10-22 DIAGNOSIS — I700 Atherosclerosis of aorta: Secondary | ICD-10-CM

## 2010-10-27 ENCOUNTER — Telehealth: Payer: Self-pay

## 2010-10-27 ENCOUNTER — Encounter: Payer: Self-pay | Admitting: Internal Medicine

## 2010-10-27 NOTE — Telephone Encounter (Signed)
Message copied by Pincus Sanes on Mon Oct 27, 2010  9:17 AM ------      Message from: Anselm Jungling      Created: Mon Oct 27, 2010  8:02 AM      Regarding: FW: f/u AAA u/s                   ----- Message -----         From: Oliver Barre, MD         Sent: 10/26/2010   8:14 PM           To: Margaret Pyle, CMA      Subject: f/u AAA u/s                                              Pt is due for AAA f/u ;  Please cal pt to see if he has already been contacted per vascular lab, or if not - can I go ahead and order this

## 2010-10-27 NOTE — Telephone Encounter (Signed)
Called the patient and he had an abdominal aorta US last week. He stated he has done every June and if not what you requested to let him know.

## 2010-11-04 ENCOUNTER — Other Ambulatory Visit: Payer: Self-pay | Admitting: Internal Medicine

## 2010-11-04 ENCOUNTER — Encounter: Payer: Self-pay | Admitting: Internal Medicine

## 2010-11-04 ENCOUNTER — Ambulatory Visit (INDEPENDENT_AMBULATORY_CARE_PROVIDER_SITE_OTHER): Payer: Medicare Other | Admitting: Internal Medicine

## 2010-11-04 DIAGNOSIS — I4891 Unspecified atrial fibrillation: Secondary | ICD-10-CM

## 2010-11-04 DIAGNOSIS — I498 Other specified cardiac arrhythmias: Secondary | ICD-10-CM

## 2010-11-04 MED ORDER — AMIODARONE HCL 100 MG PO TABS: ORAL_TABLET | ORAL | Status: DC

## 2010-11-04 NOTE — Assessment & Plan Note (Signed)
This appears to be improved with bronchodilator and inhaled corticosteroid therapy. I will ask the patient to follow up with his pulmonologist.

## 2010-11-04 NOTE — Assessment & Plan Note (Signed)
The patient's stress test demonstrates mild chronotropic incompetence. In my mind, this is not severe enough to warrant pacemaker insertion. I recommended a period of watchful waiting.

## 2010-11-04 NOTE — Patient Instructions (Signed)
Your physician recommends that you schedule a follow-up appointment in: 4 months with Dr Taylor  Your physician recommends that you continue on your current medications as directed. Please refer to the Current Medication list given to you today.   

## 2010-11-04 NOTE — Assessment & Plan Note (Signed)
He appears to be maintaining sinus rhythm very nicely today on his low dose of amiodarone. He does have occasional symptomatic PACs. I remain somewhat concerned about the possibility of amiodarone-induced lung toxicity. There is no obvious lung toxicity at this time. Despite this, I have discussed the possibility of stopping amiodarone and switching to another antiarrhythmic drug. The pros and cons of this approach have been discussed with the patient. At the present time he would like to continue low dose amiodarone.

## 2010-11-04 NOTE — Progress Notes (Signed)
HPI Omar Benson returns today for followup. He is a pleasant 76 year old man with coronary artery disease, paroxysmal atrial fibrillation, atrial flutter status post catheter ablation, and a recent diagnosis of worsening dyspnea. When I saw him in the office several months ago, he complained of increasing shortness of breath with exertion. At the time the etiology was unclear. My concern was that amiodarone might have been playing a role. He has been on low dose amiodarone for several years. Walking the patient around the office on pulse oximetry, his oxygen saturation ranged from the low 90s down to the high 70s. This was at rest and with significant exertion respectively. The sedimentation rate was drawn and not particularly elevated. He saw Dr. Shelle Iron and was begun on combination steroids and inhaled bronchodilators. He is much improved. He underwent cardiopulmonary stress testing several weeks ago which demonstrated no oxygen desaturation, evidence of diastolic dysfunction, and mild chronotropic incompetence. It was also thought that the patient was deconditioned. The report describes no significant ventilatory defect. The patient continues taking amiodarone and thinks that he is better on his Spiriva. Allergies  Allergen Reactions  . Doxycycline Hyclate     REACTION: unspecified  . Promethazine Hcl   . Tetanus Toxoid     REACTION: Rash  . Tetracycline     REACTION: Esophagitis     Current Outpatient Prescriptions  Medication Sig Dispense Refill  . allopurinol (ZYLOPRIM) 300 MG tablet Take 300 mg by mouth daily.        Marland Kitchen amiodarone (PACERONE) 100 MG tablet 1/2 tablet daily  30 tablet  6  . aspirin 81 MG tablet Take 81 mg by mouth daily.        Marland Kitchen levothyroxine (SYNTHROID, LEVOTHROID) 100 MCG tablet Take 1 tablet (100 mcg total) by mouth daily.  90 tablet  3  . omeprazole (PRILOSEC OTC) 20 MG tablet Take 20 mg by mouth daily.        Marland Kitchen tiotropium (SPIRIVA) 18 MCG inhalation capsule Place 1 capsule  (18 mcg total) into inhaler and inhale daily.  30 capsule  6  . vardenafil (LEVITRA) 20 MG tablet Take 20 mg by mouth. 1 by mouth every other day as needed       . DISCONTD: amiodarone (PACERONE) 100 MG tablet Take 100 mg by mouth daily.           Past Medical History  Diagnosis Date  . Unspecified disorder of thyroid 12/05/2008  . HYPERLIPIDEMIA 09/19/2008  . GOUT 09/19/2008  . ANEMIA-IRON DEFICIENCY 09/19/2008  . HYPERTENSION 09/19/2008  . CAD 12/05/2008  . Atrial fibrillation 04/19/2008  . ATRIAL FLUTTER, CHRONIC 12/05/2008  . BRADYCARDIA 12/05/2008  . AAA 09/19/2008  . PERIPHERAL VASCULAR DISEASE 09/19/2008  . GERD 09/19/2008  . DIVERTICULOSIS, COLON 09/19/2008  . RENAL INSUFFICIENCY 09/19/2008  . LOW BACK PAIN 09/19/2008  . BACK PAIN 03/03/2010  . Other chest pain 04/06/2008  . CHEST PAIN 03/03/2010  . EPIGASTRIC PAIN 05/03/2008  . FLANK PAIN, LEFT 01/11/2009  . PSA, INCREASED 09/19/2008  . COLONIC POLYPS, HX OF 09/19/2008  . GASTROINTESTINAL HEMORRHAGE, HX OF 04/19/2008  . NEPHROLITHIASIS, HX OF 01/11/2009  . CORONARY ARTERY BYPASS GRAFT, HX OF 12/05/2008  . ANXIETY 06/13/2010  . Postnasal drip 07/15/2010  . GASTRITIS 07/21/2010    ROS:   All systems reviewed and negative except as noted in the HPI.   Past Surgical History  Procedure Date  . Aorto-femoral bypass x5 - stress test neg 9/08   . Coronary artery bypass  graft   . S/p afib ablation      Family History  Problem Relation Age of Onset  . Diabetes Father   . Diabetes Sister   . Multiple myeloma Mother   . Heart disease Paternal Uncle   . Heart disease Maternal Uncle   . Lymphoma Sister     half-sister     History   Social History  . Marital Status: Married    Spouse Name: Omar Benson    Number of Children: N/A  . Years of Education: N/A   Occupational History  . self employeed - Contractor    Social History Main Topics  . Smoking status: Former Smoker -- 4.0 packs/day for 31 years    Quit date: 03/19/1983  . Smokeless  tobacco: Not on file  . Alcohol Use: Yes     2  . Drug Use: No  . Sexually Active: Not on file   Other Topics Concern  . Not on file   Social History Narrative   Married lives with his wife     BP 148/82  Pulse 63  Ht 5\' 10"  (1.778 m)  Wt 183 lb 12.8 oz (83.371 kg)  BMI 26.37 kg/m2  Physical Exam:  Well appearing NAD HEENT: Unremarkable Neck:  No JVD, no thyromegally Lymphatics:  No adenopathy Back:  No CVA tenderness Lungs:  Clear HEART:  Regular rate rhythm, no murmurs, no rubs, no clicks Abd:  Flat, positive bowel sounds, no organomegally, no rebound, no guarding Ext:  2 plus pulses, no edema, no cyanosis, no clubbing Skin:  No rashes no nodules Neuro:  CN II through XII intact, motor grossly intact  DEVICE  Normal device function.  See PaceArt for details.   Assess/Plan:

## 2010-12-08 ENCOUNTER — Other Ambulatory Visit (INDEPENDENT_AMBULATORY_CARE_PROVIDER_SITE_OTHER): Payer: Medicare Other

## 2010-12-08 DIAGNOSIS — Z79899 Other long term (current) drug therapy: Secondary | ICD-10-CM

## 2010-12-08 DIAGNOSIS — Z125 Encounter for screening for malignant neoplasm of prostate: Secondary | ICD-10-CM

## 2010-12-08 DIAGNOSIS — Z0000 Encounter for general adult medical examination without abnormal findings: Secondary | ICD-10-CM

## 2010-12-08 DIAGNOSIS — E039 Hypothyroidism, unspecified: Secondary | ICD-10-CM

## 2010-12-08 LAB — URINALYSIS, ROUTINE W REFLEX MICROSCOPIC
Bilirubin Urine: NEGATIVE
Ketones, ur: NEGATIVE
Specific Gravity, Urine: 1.02 (ref 1.000–1.030)
Urine Glucose: NEGATIVE
Urobilinogen, UA: 0.2 (ref 0.0–1.0)
pH: 6 (ref 5.0–8.0)

## 2010-12-08 LAB — CBC WITH DIFFERENTIAL/PLATELET
Basophils Absolute: 0 10*3/uL (ref 0.0–0.1)
Eosinophils Absolute: 0.2 10*3/uL (ref 0.0–0.7)
HCT: 39.6 % (ref 39.0–52.0)
Lymphs Abs: 0.9 10*3/uL (ref 0.7–4.0)
MCHC: 33.5 g/dL (ref 30.0–36.0)
Monocytes Relative: 10.7 % (ref 3.0–12.0)
Platelets: 174 10*3/uL (ref 150.0–400.0)
RDW: 15.2 % — ABNORMAL HIGH (ref 11.5–14.6)

## 2010-12-08 LAB — HEPATIC FUNCTION PANEL
AST: 16 U/L (ref 0–37)
Albumin: 4 g/dL (ref 3.5–5.2)
Alkaline Phosphatase: 89 U/L (ref 39–117)
Bilirubin, Direct: 0.1 mg/dL (ref 0.0–0.3)
Total Protein: 7.1 g/dL (ref 6.0–8.3)

## 2010-12-08 LAB — BASIC METABOLIC PANEL
CO2: 27 mEq/L (ref 19–32)
GFR: 47.14 mL/min — ABNORMAL LOW (ref >60.00)
Glucose, Bld: 101 mg/dL — ABNORMAL HIGH (ref 70–99)
Potassium: 4.6 mEq/L (ref 3.5–5.1)
Sodium: 137 mEq/L (ref 135–145)

## 2010-12-08 LAB — TSH: TSH: 2.44 u[IU]/mL (ref 0.35–5.50)

## 2010-12-09 ENCOUNTER — Other Ambulatory Visit: Payer: Self-pay | Admitting: Internal Medicine

## 2010-12-10 ENCOUNTER — Telehealth: Payer: Self-pay | Admitting: Internal Medicine

## 2010-12-10 ENCOUNTER — Other Ambulatory Visit: Payer: Self-pay | Admitting: *Deleted

## 2010-12-10 DIAGNOSIS — I4891 Unspecified atrial fibrillation: Secondary | ICD-10-CM

## 2010-12-10 NOTE — Telephone Encounter (Signed)
Pt called and said prescription was messed up at the phar.  Cannot afford Pacerone.  Please call Phar. And pres. Amionodrone.  [facility] on [address].

## 2010-12-10 NOTE — Telephone Encounter (Signed)
Spoke with pharmacist and changed the rx to Amiodarone 200mg  take 1/2 daily

## 2010-12-11 NOTE — Telephone Encounter (Signed)
**Note De-identified Efton Thomley Obfuscation** Hayden pt. 

## 2010-12-15 ENCOUNTER — Encounter: Payer: Self-pay | Admitting: Internal Medicine

## 2010-12-18 ENCOUNTER — Ambulatory Visit (INDEPENDENT_AMBULATORY_CARE_PROVIDER_SITE_OTHER): Payer: Medicare Other | Admitting: Internal Medicine

## 2010-12-18 ENCOUNTER — Encounter: Payer: Self-pay | Admitting: Internal Medicine

## 2010-12-18 VITALS — BP 132/80 | HR 55 | Temp 97.8°F | Ht 70.0 in | Wt 184.4 lb

## 2010-12-18 DIAGNOSIS — E039 Hypothyroidism, unspecified: Secondary | ICD-10-CM

## 2010-12-18 DIAGNOSIS — Z0000 Encounter for general adult medical examination without abnormal findings: Secondary | ICD-10-CM

## 2010-12-18 MED ORDER — ALLOPURINOL 300 MG PO TABS: 300.0000 mg | ORAL_TABLET | Freq: Every day | ORAL | Status: DC

## 2010-12-18 MED ORDER — LEVOTHYROXINE SODIUM 100 MCG PO TABS: 100.0000 ug | ORAL_TABLET | Freq: Every day | ORAL | Status: DC

## 2010-12-18 MED ORDER — VARDENAFIL HCL 20 MG PO TABS: ORAL_TABLET | ORAL | Status: DC

## 2010-12-18 NOTE — Assessment & Plan Note (Signed)

## 2010-12-18 NOTE — Patient Instructions (Signed)
Continue all other medications as before Please keep your appointments with your specialists as you have planned Please have your pharmacy call if further refills needed Please return in 1 year for your yearly visit, or sooner if needed, with Lab testing done 3-5 days before

## 2010-12-20 ENCOUNTER — Encounter: Payer: Self-pay | Admitting: Internal Medicine

## 2010-12-20 NOTE — Assessment & Plan Note (Signed)
stable overall by hx and exam, most recent data reviewed with pt, and pt to continue medical treatment as before  Lab Results  Component Value Date   TSH 2.44 12/08/2010

## 2010-12-20 NOTE — Progress Notes (Signed)
Subjective:    Patient ID: Omar Benson, male    DOB: 06-28-1934, 76 y.o.   MRN: [id number]  HPI Here for wellness and f/u;  Overall doing ok;  Pt denies CP, worsening SOB, DOE, wheezing, orthopnea, PND, worsening LE edema, palpitations, dizziness or syncope.  Pt denies neurological change such as new Headache, facial or extremity weakness.  Pt denies polydipsia, polyuria, or low sugar symptoms. Pt states overall good compliance with treatment and medications, good tolerability, and trying to follow lower cholesterol diet.  Pt denies worsening depressive symptoms, suicidal ideation or panic. No fever, wt loss, night sweats, loss of appetite, or other constitutional symptoms.  Pt states good ability with ADL's, low fall risk, home safety reviewed and adequate, no significant changes in hearing or vision, and occasionally active with exercise.  Denies hyper or hypo thyroid symptoms such as voice, skin or hair change. Past Medical History  Diagnosis Date  . Unspecified disorder of thyroid 12/05/2008  . HYPERLIPIDEMIA 09/19/2008  . GOUT 09/19/2008  . ANEMIA-IRON DEFICIENCY 09/19/2008  . HYPERTENSION 09/19/2008  . CAD 12/05/2008  . Atrial fibrillation 04/19/2008  . ATRIAL FLUTTER, CHRONIC 12/05/2008  . BRADYCARDIA 12/05/2008  . AAA 09/19/2008  . PERIPHERAL VASCULAR DISEASE 09/19/2008  . GERD 09/19/2008  . DIVERTICULOSIS, COLON 09/19/2008  . RENAL INSUFFICIENCY 09/19/2008  . LOW BACK PAIN 09/19/2008  . BACK PAIN 03/03/2010  . Other chest pain 04/06/2008  . CHEST PAIN 03/03/2010  . EPIGASTRIC PAIN 05/03/2008  . FLANK PAIN, LEFT 01/11/2009  . PSA, INCREASED 09/19/2008  . COLONIC POLYPS, HX OF 09/19/2008  . GASTROINTESTINAL HEMORRHAGE, HX OF 04/19/2008  . NEPHROLITHIASIS, HX OF 01/11/2009  . CORONARY ARTERY BYPASS GRAFT, HX OF 12/05/2008  . ANXIETY 06/13/2010  . Postnasal drip 07/15/2010  . GASTRITIS 07/21/2010   Past Surgical History  Procedure Date  . Aorto-femoral bypass x5 - stress test neg 9/08   . Coronary artery  bypass graft   . S/p afib ablation     reports that he quit smoking about 27 years ago. He does not have any smokeless tobacco history on file. He reports that he drinks alcohol. He reports that he does not use illicit drugs. family history includes Diabetes in his father and sister; Heart disease in his maternal uncle and paternal uncle; Lymphoma in his sister; and Multiple myeloma in his mother. Allergies  Allergen Reactions  . Doxycycline Hyclate     REACTION: unspecified  . Promethazine Hcl   . Tetanus Toxoid     REACTION: Rash  . Tetracycline     REACTION: Esophagitis   Current Outpatient Prescriptions on File Prior to Visit  Medication Sig Dispense Refill  . aspirin 81 MG tablet Take 81 mg by mouth daily.        Marland Kitchen omeprazole (PRILOSEC OTC) 20 MG tablet Take 20 mg by mouth daily.        Marland Kitchen tiotropium (SPIRIVA) 18 MCG inhalation capsule Place 1 capsule (18 mcg total) into inhaler and inhale daily.  30 capsule  6  . amiodarone (PACERONE) 100 MG tablet 1/2 tablet daily  30 tablet  6   Review of Systems Review of Systems  Constitutional: Negative for diaphoresis, activity change, appetite change and unexpected weight change.  HENT: Negative for hearing loss, ear pain, facial swelling, mouth sores and neck stiffness.   Eyes: Negative for pain, redness and visual disturbance.  Respiratory: Negative for shortness of breath and wheezing.   Cardiovascular: Negative for chest pain and palpitations.  Gastrointestinal:  Negative for diarrhea, blood in stool, abdominal distention and rectal pain.  Genitourinary: Negative for hematuria, flank pain and decreased urine volume.  Musculoskeletal: Negative for myalgias and joint swelling.  Skin: Negative for color change and wound.  Neurological: Negative for syncope and numbness.  Hematological: Negative for adenopathy.  Psychiatric/Behavioral: Negative for hallucinations, self-injury, decreased concentration and agitation.      Objective:    Physical Exam BP 132/80  Pulse 55  Temp(Src) 97.8 F (36.6 C) (Oral)  Ht 5\' 10"  (1.778 m)  Wt 184 lb 6 oz (83.632 kg)  BMI 26.46 kg/m2  SpO2 96% Physical Exam  VS noted Constitutional: Pt is oriented to person, place, and time. Appears well-developed and well-nourished.  HENT:  Head: Normocephalic and atraumatic.  Right Ear: External ear normal.  Left Ear: External ear normal.  Nose: Nose normal.  Mouth/Throat: Oropharynx is clear and moist.  Eyes: Conjunctivae and EOM are normal. Pupils are equal, round, and reactive to light.  Neck: Normal range of motion. Neck supple. No JVD present. No tracheal deviation present.  Cardiovascular: Normal rate, regular rhythm, normal heart sounds and intact distal pulses.   Pulmonary/Chest: Effort normal and breath sounds normal.  Abdominal: Soft. Bowel sounds are normal. There is no tenderness.  Musculoskeletal: Normal range of motion. Exhibits no edema.  Lymphadenopathy:  Has no cervical adenopathy.  Neurological: Pt is alert and oriented to person, place, and time. Pt has normal reflexes. No cranial nerve deficit.  Skin: Skin is warm and dry. No rash noted.  Psychiatric:  Has  normal mood and affect. Behavior is normal.         Assessment & Plan:

## 2011-01-09 ENCOUNTER — Ambulatory Visit (INDEPENDENT_AMBULATORY_CARE_PROVIDER_SITE_OTHER): Payer: Medicare Other | Admitting: Pulmonary Disease

## 2011-01-09 ENCOUNTER — Encounter: Payer: Self-pay | Admitting: Pulmonary Disease

## 2011-01-09 VITALS — BP 122/74 | HR 56 | Temp 98.1°F | Ht 70.0 in | Wt 182.8 lb

## 2011-01-09 DIAGNOSIS — J449 Chronic obstructive pulmonary disease, unspecified: Secondary | ICD-10-CM

## 2011-01-09 DIAGNOSIS — J4489 Other specified chronic obstructive pulmonary disease: Secondary | ICD-10-CM

## 2011-01-09 NOTE — Assessment & Plan Note (Addendum)
The patient has very mild obstructive disease by his pulmonary function studies, and overall has a very good quality of life and exertional tolerance.  He tells me that he can walk on a treadmill at 3.2 miles per hour for 45 minutes to an hour every day.  He states the Spiriva has helped his breathing, but he has not been using her rescue inhaler.  He has unusual symptoms at times, and  it is unclear to him whether it is from his lungs or not.  I have asked him to keep a rescue inhaler available to use as needed.

## 2011-01-09 NOTE — Progress Notes (Signed)
  Subjective:    Patient ID: Omar Benson, male    DOB: [birth year], 76 y.o.   MRN: [id number]  HPI The patient comes in today for followup of his known mild COPD.  He feels the Spiriva has improved his breathing, and overall he is satisfied with his quality of life.  He still has episodes at times where he is doing strenuous work that leads to "episodes" of sob.  He is not using a rescue inhaler, and I have asked him to keep one handy to see if it will help (it may not).  He denies any chest congestion, cough, or purulence.    Review of Systems  Constitutional: Negative for fever and unexpected weight change.  HENT: Positive for congestion. Negative for ear pain, nosebleeds, sore throat, rhinorrhea, sneezing, trouble swallowing, dental problem, postnasal drip and sinus pressure.   Eyes: Negative for redness and itching.  Respiratory: Positive for chest tightness. Negative for cough, shortness of breath and wheezing.   Cardiovascular: Negative for palpitations and leg swelling.  Gastrointestinal: Negative for nausea and vomiting.  Genitourinary: Negative for dysuria.  Musculoskeletal: Negative for joint swelling.  Skin: Negative for rash.  Neurological: Negative for headaches.  Hematological: Does not bruise/bleed easily.  Psychiatric/Behavioral: Negative for dysphoric mood. The patient is not nervous/anxious.        Objective:   Physical Exam Well-developed male in no acute distress Nose without purulence or discharge noted Chest with no significant rhonchi or crackles, no wheezing noted.  Adequate air flow throughout Lower extremities without edema, no cyanosis noted Alert and oriented, moves all 4 extremities.       Assessment & Plan:

## 2011-01-09 NOTE — Patient Instructions (Signed)
Stay on spiriva, but keep rescue inhaler available if needed. Stay active, followup with me in one year if doing well.

## 2011-02-26 ENCOUNTER — Ambulatory Visit: Payer: Medicare Other | Admitting: Internal Medicine

## 2011-02-27 LAB — MAGNESIUM: Magnesium: 2

## 2011-02-27 LAB — I-STAT 8, (EC8 V) (CONVERTED LAB)
BUN: 19
Chloride: 105
Glucose, Bld: 111 — ABNORMAL HIGH
Hemoglobin: 15
Operator id: 285491
Sodium: 137
pCO2, Ven: 42.2 — ABNORMAL LOW

## 2011-02-27 LAB — DIFFERENTIAL
Basophils Relative: 1
Lymphocytes Relative: 18
Monocytes Absolute: 0.4
Monocytes Relative: 10
Neutro Abs: 2.7
Neutrophils Relative %: 70

## 2011-02-27 LAB — CBC
Hemoglobin: 13.9
MCHC: 34.1
RBC: 4.39
WBC: 3.9 — ABNORMAL LOW

## 2011-02-27 LAB — POCT CARDIAC MARKERS
CKMB, poc: 1.2
CKMB, poc: <1 — ABNORMAL LOW
Myoglobin, poc: 70.4
Operator id: 285491
Troponin i, poc: <0.05

## 2011-02-27 LAB — LIPID PANEL
Cholesterol: 111
HDL: 44
LDL Cholesterol: 60
Total CHOL/HDL Ratio: 2.5
Triglycerides: 36

## 2011-02-27 LAB — APTT: aPTT: 29

## 2011-02-27 LAB — CARDIAC PANEL(CRET KIN+CKTOT+MB+TROPI)
CK, MB: 1.2
Relative Index: INVALID
Total CK: 87
Troponin I: 0.02

## 2011-02-27 LAB — TROPONIN I: Troponin I: 0.01

## 2011-02-27 LAB — PROTIME-INR: INR: 1

## 2011-02-27 LAB — CREATININE, SERUM: Creatinine, Ser: 1.43

## 2011-03-10 ENCOUNTER — Encounter: Payer: Self-pay | Admitting: Internal Medicine

## 2011-03-10 ENCOUNTER — Ambulatory Visit (INDEPENDENT_AMBULATORY_CARE_PROVIDER_SITE_OTHER): Payer: Medicare Other | Admitting: Internal Medicine

## 2011-03-10 DIAGNOSIS — J449 Chronic obstructive pulmonary disease, unspecified: Secondary | ICD-10-CM

## 2011-03-10 DIAGNOSIS — I498 Other specified cardiac arrhythmias: Secondary | ICD-10-CM

## 2011-03-10 DIAGNOSIS — I4891 Unspecified atrial fibrillation: Secondary | ICD-10-CM

## 2011-03-10 NOTE — Patient Instructions (Signed)
Your physician wants you to follow-up in: 6 months with Dr Taylor You will receive a reminder letter in the mail two months in advance. If you don't receive a letter, please call our office to schedule the follow-up appointment.  

## 2011-03-10 NOTE — Assessment & Plan Note (Signed)
The patient is currently asymptomatic. I recommended a period of watchful waiting. He will continue his low-dose amiodarone.

## 2011-03-10 NOTE — Assessment & Plan Note (Signed)
He is much improved on Spiriva. He'll continue this medication. He stopped smoking many years ago.

## 2011-03-10 NOTE — Progress Notes (Signed)
HPI [name] returns today for followup. He is a very pleasant 76 year old man with coronary artery disease, status post bypass surgery, COPD, atrial fibrillation on amiodarone, and atrial flutter status post catheter ablation. Several months ago, the patient's dyspnea was much worse. He had desaturation with exertion. He was seen by pulmonary and begun on Spiriva. Since then he has done well. His dyspnea has resolved. He denies chest pain, shortness of breath, or syncope. Allergies  Allergen Reactions  . Doxycycline Hyclate     REACTION: unspecified  . Promethazine Hcl     Phenergan  . Tetanus Toxoid     REACTION: Rash  . Tetracycline     REACTION: Esophagitis     Current Outpatient Prescriptions  Medication Sig Dispense Refill  . allopurinol (ZYLOPRIM) 300 MG tablet Take 1 tablet (300 mg total) by mouth daily.  90 tablet  3  . amiodarone (PACERONE) 100 MG tablet 1/2 tablet daily  30 tablet  6  . levothyroxine (SYNTHROID, LEVOTHROID) 100 MCG tablet Take 1 tablet (100 mcg total) by mouth daily.  90 tablet  3  . omeprazole (PRILOSEC OTC) 20 MG tablet Take 20 mg by mouth daily.        . Potassium 99 MG TABS Take by mouth daily.        Marland Kitchen tiotropium (SPIRIVA) 18 MCG inhalation capsule Place 1 capsule (18 mcg total) into inhaler and inhale daily.  30 capsule  6  . vardenafil (LEVITRA) 20 MG tablet 1 by mouth every other day as needed  10 tablet  5     Past Medical History  Diagnosis Date  . Unspecified disorder of thyroid 12/05/2008  . HYPERLIPIDEMIA 09/19/2008  . GOUT 09/19/2008  . ANEMIA-IRON DEFICIENCY 09/19/2008  . HYPERTENSION 09/19/2008  . CAD 12/05/2008  . Atrial fibrillation 04/19/2008  . ATRIAL FLUTTER, CHRONIC 12/05/2008  . BRADYCARDIA 12/05/2008  . AAA 09/19/2008  . PERIPHERAL VASCULAR DISEASE 09/19/2008  . GERD 09/19/2008  . DIVERTICULOSIS, COLON 09/19/2008  . RENAL INSUFFICIENCY 09/19/2008  . LOW BACK PAIN 09/19/2008  . BACK PAIN 03/03/2010  . Other chest pain 04/06/2008  . CHEST PAIN  03/03/2010  . EPIGASTRIC PAIN 05/03/2008  . FLANK PAIN, LEFT 01/11/2009  . PSA, INCREASED 09/19/2008  . COLONIC POLYPS, HX OF 09/19/2008  . GASTROINTESTINAL HEMORRHAGE, HX OF 04/19/2008  . NEPHROLITHIASIS, HX OF 01/11/2009  . CORONARY ARTERY BYPASS GRAFT, HX OF 12/05/2008  . ANXIETY 06/13/2010  . Postnasal drip 07/15/2010  . GASTRITIS 07/21/2010    ROS:   All systems reviewed and negative except as noted in the HPI.   Past Surgical History  Procedure Date  . Aorto-femoral bypass x5 - stress test neg 9/08   . Coronary artery bypass graft   . S/p afib ablation      Family History  Problem Relation Age of Onset  . Diabetes Father   . Diabetes Sister   . Multiple myeloma Mother   . Heart disease Paternal Uncle   . Heart disease Maternal Uncle   . Lymphoma Sister     half-sister     History   Social History  . Marital Status: Married    Spouse Name: Victorino Dike    Number of Children: N/A  . Years of Education: N/A   Occupational History  . self employeed - Contractor    Social History Main Topics  . Smoking status: Former Smoker -- 4.0 packs/day for 31 years    Quit date: 03/19/1983  . Smokeless tobacco: Not on  file  . Alcohol Use: Yes     2  . Drug Use: No  . Sexually Active: Not on file   Other Topics Concern  . Not on file   Social History Narrative   Married lives with his wife     BP 138/72  Pulse 56  Ht 5\' 10"  (1.778 m)  Wt 182 lb 1.9 oz (82.609 kg)  BMI 26.13 kg/m2  Physical Exam:  Well appearing NAD HEENT: Unremarkable Neck:  No JVD, no thyromegally Lymphatics:  No adenopathy Back:  No CVA tenderness Lungs:  Clear with no wheezes, rales HEART:  Regular rate rhythm, no murmurs, no rubs, no clicks Abd:  soft, positive bowel sounds, no organomegally, no rebound, no guarding Ext:  2 plus pulses, no edema, no cyanosis, no clubbing Skin:  No rashes no nodules Neuro:  CN II through XII intact, motor grossly intact   Assess/Plan:

## 2011-03-10 NOTE — Assessment & Plan Note (Signed)
His symptoms are well controlled. I have asked him to continue his current meds.

## 2011-04-06 ENCOUNTER — Telehealth: Payer: Self-pay | Admitting: Internal Medicine

## 2011-04-06 NOTE — Telephone Encounter (Signed)
New problem Pt called and said his rate has been going down to 38 He said his normal heart rate is 55 He said he feels funny when it gets that low but no chest pain Please call

## 2011-04-06 NOTE — Telephone Encounter (Signed)
Sitting at computer this morning and "felt funny" Heart felt like it was stuttering.  Only thing he is taking now is Amiodarone 100mg  It lasted for about 10 seconds or so  He jumped up and moved around and heart rate went up to 55.  He felt a little bit dizzy he thinks.  He does not know if "it scared the crap out of him or he got dizzy"  Today is the first time this has ever happened

## 2011-04-07 NOTE — Telephone Encounter (Signed)
Spoke with patient and let him know I would be glad to talk with Dr [name] tomorrow  He has not had any more of the spells since yesterday.

## 2011-04-08 NOTE — Telephone Encounter (Signed)
Discussed with Dr [name], he is to call us back if this happens again and we will have him wear a monitor.  I have called the patient back and explained to him that it may be PVC's he is having and what Dr Lubertha Basque recommendation was He was appreciative and will let me know

## 2011-05-23 ENCOUNTER — Other Ambulatory Visit: Payer: Self-pay

## 2011-05-23 ENCOUNTER — Emergency Department (HOSPITAL_COMMUNITY): Payer: Medicare Other

## 2011-05-23 ENCOUNTER — Encounter (HOSPITAL_COMMUNITY): Payer: Self-pay | Admitting: *Deleted

## 2011-05-23 ENCOUNTER — Inpatient Hospital Stay (HOSPITAL_COMMUNITY)
Admission: EM | Admit: 2011-05-23 | Discharge: 2011-05-27 | DRG: 281 | Disposition: A | Discharge status: Home / Self Care | Payer: Medicare Other | Attending: Cardiology | Admitting: Cardiology

## 2011-05-23 DIAGNOSIS — I25810 Atherosclerosis of coronary artery bypass graft(s) without angina pectoris: Secondary | ICD-10-CM | POA: Diagnosis present

## 2011-05-23 DIAGNOSIS — Z807 Family history of other malignant neoplasms of lymphoid, hematopoietic and related tissues: Secondary | ICD-10-CM

## 2011-05-23 DIAGNOSIS — I10 Essential (primary) hypertension: Secondary | ICD-10-CM | POA: Insufficient documentation

## 2011-05-23 DIAGNOSIS — I4891 Unspecified atrial fibrillation: Secondary | ICD-10-CM | POA: Diagnosis present

## 2011-05-23 DIAGNOSIS — Z7902 Long term (current) use of antithrombotics/antiplatelets: Secondary | ICD-10-CM

## 2011-05-23 DIAGNOSIS — I200 Unstable angina: Secondary | ICD-10-CM

## 2011-05-23 DIAGNOSIS — M109 Gout, unspecified: Secondary | ICD-10-CM | POA: Diagnosis present

## 2011-05-23 DIAGNOSIS — I4892 Unspecified atrial flutter: Secondary | ICD-10-CM | POA: Insufficient documentation

## 2011-05-23 DIAGNOSIS — I480 Paroxysmal atrial fibrillation: Secondary | ICD-10-CM | POA: Insufficient documentation

## 2011-05-23 DIAGNOSIS — F411 Generalized anxiety disorder: Secondary | ICD-10-CM | POA: Diagnosis present

## 2011-05-23 DIAGNOSIS — E785 Hyperlipidemia, unspecified: Secondary | ICD-10-CM | POA: Diagnosis present

## 2011-05-23 DIAGNOSIS — K219 Gastro-esophageal reflux disease without esophagitis: Secondary | ICD-10-CM | POA: Insufficient documentation

## 2011-05-23 DIAGNOSIS — G8929 Other chronic pain: Secondary | ICD-10-CM | POA: Diagnosis present

## 2011-05-23 DIAGNOSIS — Z87442 Personal history of urinary calculi: Secondary | ICD-10-CM

## 2011-05-23 DIAGNOSIS — Z833 Family history of diabetes mellitus: Secondary | ICD-10-CM

## 2011-05-23 DIAGNOSIS — I495 Sick sinus syndrome: Secondary | ICD-10-CM | POA: Insufficient documentation

## 2011-05-23 DIAGNOSIS — M549 Dorsalgia, unspecified: Secondary | ICD-10-CM | POA: Diagnosis present

## 2011-05-23 DIAGNOSIS — J449 Chronic obstructive pulmonary disease, unspecified: Secondary | ICD-10-CM | POA: Insufficient documentation

## 2011-05-23 DIAGNOSIS — Z887 Allergy status to serum and vaccine status: Secondary | ICD-10-CM

## 2011-05-23 DIAGNOSIS — Z8249 Family history of ischemic heart disease and other diseases of the circulatory system: Secondary | ICD-10-CM

## 2011-05-23 DIAGNOSIS — I2510 Atherosclerotic heart disease of native coronary artery without angina pectoris: Secondary | ICD-10-CM | POA: Diagnosis present

## 2011-05-23 DIAGNOSIS — I214 Non-ST elevation (NSTEMI) myocardial infarction: Principal | ICD-10-CM | POA: Diagnosis present

## 2011-05-23 DIAGNOSIS — Z881 Allergy status to other antibiotic agents status: Secondary | ICD-10-CM

## 2011-05-23 DIAGNOSIS — E039 Hypothyroidism, unspecified: Secondary | ICD-10-CM | POA: Insufficient documentation

## 2011-05-23 DIAGNOSIS — D509 Iron deficiency anemia, unspecified: Secondary | ICD-10-CM | POA: Diagnosis present

## 2011-05-23 DIAGNOSIS — Z888 Allergy status to other drugs, medicaments and biological substances status: Secondary | ICD-10-CM

## 2011-05-23 DIAGNOSIS — Z87891 Personal history of nicotine dependence: Secondary | ICD-10-CM

## 2011-05-23 DIAGNOSIS — I498 Other specified cardiac arrhythmias: Secondary | ICD-10-CM | POA: Diagnosis present

## 2011-05-23 DIAGNOSIS — Z7982 Long term (current) use of aspirin: Secondary | ICD-10-CM

## 2011-05-23 DIAGNOSIS — I70209 Unspecified atherosclerosis of native arteries of extremities, unspecified extremity: Secondary | ICD-10-CM | POA: Diagnosis present

## 2011-05-23 DIAGNOSIS — J4489 Other specified chronic obstructive pulmonary disease: Secondary | ICD-10-CM | POA: Diagnosis present

## 2011-05-23 DIAGNOSIS — Z79899 Other long term (current) drug therapy: Secondary | ICD-10-CM

## 2011-05-23 HISTORY — DX: Unspecified osteoarthritis, unspecified site: M19.90

## 2011-05-23 HISTORY — DX: Chronic obstructive pulmonary disease, unspecified: J44.9

## 2011-05-23 HISTORY — DX: Hypothyroidism, unspecified: E03.9

## 2011-05-23 LAB — BASIC METABOLIC PANEL
BUN: 20 mg/dL (ref 6–23)
GFR calc non Af Amer: 42 mL/min — ABNORMAL LOW (ref >90)
Glucose, Bld: 116 mg/dL — ABNORMAL HIGH (ref 70–99)
Potassium: 3.8 mEq/L (ref 3.5–5.1)

## 2011-05-23 LAB — POCT I-STAT TROPONIN I: Troponin i, poc: 0.54 ng/mL (ref 0.00–0.08)

## 2011-05-23 LAB — CBC
HCT: 39.6 % (ref 39.0–52.0)
Hemoglobin: 13.7 g/dL (ref 13.0–17.0)
MCH: 31.9 pg (ref 26.0–34.0)
MCH: 31.8 pg (ref 26.0–34.0)
MCHC: 34.6 g/dL (ref 30.0–36.0)
MCV: 92.3 fL (ref 78.0–100.0)
Platelets: 168 10*3/uL (ref 150–400)
Platelets: 180 10*3/uL (ref 150–400)
RBC: 4.29 MIL/uL (ref 4.22–5.81)
RBC: 4.44 MIL/uL (ref 4.22–5.81)
RDW: 13.5 % (ref 11.5–15.5)
WBC: 4.4 10*3/uL (ref 4.0–10.5)

## 2011-05-23 LAB — DIFFERENTIAL
Basophils Relative: 0 % (ref 0–1)
Eosinophils Absolute: 0.2 10*3/uL (ref 0.0–0.7)
Eosinophils Absolute: 0.2 10*3/uL (ref 0.0–0.7)
Lymphs Abs: 0.7 10*3/uL (ref 0.7–4.0)
Lymphs Abs: 1.2 10*3/uL (ref 0.7–4.0)
Monocytes Absolute: 0.4 10*3/uL (ref 0.1–1.0)
Monocytes Relative: 10 % (ref 3–12)
Neutrophils Relative %: 69 % (ref 43–77)
Neutrophils Relative %: 65 % (ref 43–77)

## 2011-05-23 LAB — CARDIAC PANEL(CRET KIN+CKTOT+MB+TROPI)
CK, MB: 53.4 ng/mL (ref 0.3–4.0)
Relative Index: INVALID (ref 0.0–2.5)
Troponin I: <0.3 ng/mL (ref <0.30)
Troponin I: <0.3 ng/mL (ref <0.30)

## 2011-05-23 LAB — PROTIME-INR
INR: 1.01 (ref 0.00–1.49)
Prothrombin Time: 13.5 seconds (ref 11.6–15.2)

## 2011-05-23 MED ORDER — ASPIRIN EC 325 MG PO TBEC
325.0000 mg | DELAYED_RELEASE_TABLET | Freq: Every day | ORAL | Status: DC
  Administered 2011-05-25: 325 mg via ORAL
  Filled 2011-05-23: qty 1

## 2011-05-23 MED ORDER — ROSUVASTATIN CALCIUM 10 MG PO TABS
10.0000 mg | ORAL_TABLET | Freq: Every day | ORAL | Status: DC
  Administered 2011-05-23 – 2011-05-27 (×5): 10 mg via ORAL
  Filled 2011-05-23 (×5): qty 1

## 2011-05-23 MED ORDER — ASPIRIN EC 81 MG PO TBEC
81.0000 mg | DELAYED_RELEASE_TABLET | Freq: Every day | ORAL | Status: DC
  Administered 2011-05-24 – 2011-05-27 (×4): 81 mg via ORAL
  Filled 2011-05-23 (×4): qty 1

## 2011-05-23 MED ORDER — NITROGLYCERIN 0.4 MG SL SUBL: 0.4000 mg | SUBLINGUAL_TABLET | SUBLINGUAL | Status: DC | PRN

## 2011-05-23 MED ORDER — SODIUM CHLORIDE 0.9 % IV SOLN
250.0000 mL | INTRAVENOUS | Status: DC | PRN
  Administered 2011-05-24: 250 mL via INTRAVENOUS

## 2011-05-23 MED ORDER — ACETAMINOPHEN 325 MG PO TABS
650.0000 mg | ORAL_TABLET | ORAL | Status: DC | PRN
  Administered 2011-05-23: 650 mg via ORAL
  Filled 2011-05-23 (×2): qty 1

## 2011-05-23 MED ORDER — SODIUM CHLORIDE 0.9 % IJ SOLN
3.0000 mL | Freq: Two times a day (BID) | INTRAMUSCULAR | Status: DC
  Administered 2011-05-24: 3 mL via INTRAVENOUS

## 2011-05-23 MED ORDER — HEPARIN SOD (PORCINE) IN D5W 100 UNIT/ML IV SOLN
1000.0000 [IU]/h | INTRAVENOUS | Status: DC
  Administered 2011-05-23 – 2011-05-24 (×2): 800 [IU]/h via INTRAVENOUS
  Filled 2011-05-23: qty 250

## 2011-05-23 MED ORDER — ONDANSETRON HCL 4 MG/2ML IJ SOLN
4.0000 mg | Freq: Once | INTRAMUSCULAR | Status: AC
  Administered 2011-05-23: 4 mg via INTRAVENOUS
  Filled 2011-05-23: qty 2

## 2011-05-23 MED ORDER — HEPARIN SODIUM (PORCINE) 5000 UNIT/ML IJ SOLN
INTRAMUSCULAR | Status: AC
  Filled 2011-05-23: qty 1

## 2011-05-23 MED ORDER — LEVOTHYROXINE SODIUM 100 MCG PO TABS
100.0000 ug | ORAL_TABLET | Freq: Every day | ORAL | Status: DC
  Administered 2011-05-24 – 2011-05-27 (×4): 100 ug via ORAL
  Filled 2011-05-23 (×5): qty 1

## 2011-05-23 MED ORDER — HEPARIN SODIUM (PORCINE) 5000 UNIT/ML IJ SOLN
INTRAMUSCULAR | Status: AC
  Administered 2011-05-23: 13:00:00
  Filled 2011-05-23: qty 1

## 2011-05-23 MED ORDER — NITROGLYCERIN 2 % TD OINT: 1.0000 [in_us] | TOPICAL_OINTMENT | Freq: Four times a day (QID) | TRANSDERMAL | Status: DC

## 2011-05-23 MED ORDER — SODIUM CHLORIDE 0.9 % IJ SOLN: 3.0000 mL | INTRAMUSCULAR | Status: DC | PRN

## 2011-05-23 MED ORDER — NITROGLYCERIN 2 % TD OINT
0.5000 [in_us] | TOPICAL_OINTMENT | Freq: Once | TRANSDERMAL | Status: AC
  Administered 2011-05-23: 0.5 [in_us] via TOPICAL
  Filled 2011-05-23: qty 1

## 2011-05-23 MED ORDER — ASPIRIN 81 MG PO CHEW
324.0000 mg | CHEWABLE_TABLET | ORAL | Status: AC
  Administered 2011-05-23: 324 mg via ORAL
  Filled 2011-05-23: qty 4

## 2011-05-23 MED ORDER — ALLOPURINOL 300 MG PO TABS
300.0000 mg | ORAL_TABLET | Freq: Every day | ORAL | Status: DC
  Administered 2011-05-23 – 2011-05-27 (×5): 300 mg via ORAL
  Filled 2011-05-23 (×5): qty 1

## 2011-05-23 MED ORDER — TIOTROPIUM BROMIDE MONOHYDRATE 18 MCG IN CAPS
18.0000 ug | ORAL_CAPSULE | Freq: Every day | RESPIRATORY_TRACT | Status: DC
  Administered 2011-05-24 – 2011-05-27 (×4): 18 ug via RESPIRATORY_TRACT
  Filled 2011-05-23 (×2): qty 5

## 2011-05-23 MED ORDER — HEPARIN SOD (PORCINE) IN D5W 100 UNIT/ML IV SOLN
INTRAVENOUS | Status: AC
  Administered 2011-05-24: 800 [IU]/h via INTRAVENOUS
  Filled 2011-05-23: qty 250

## 2011-05-23 MED ORDER — MORPHINE SULFATE 4 MG/ML IJ SOLN
4.0000 mg | Freq: Once | INTRAMUSCULAR | Status: AC
  Administered 2011-05-23: 4 mg via INTRAVENOUS
  Filled 2011-05-23: qty 1

## 2011-05-23 MED ORDER — AMIODARONE HCL 100 MG PO TABS
100.0000 mg | ORAL_TABLET | Freq: Every day | ORAL | Status: DC
  Administered 2011-05-23 – 2011-05-27 (×5): 100 mg via ORAL
  Filled 2011-05-23 (×5): qty 1

## 2011-05-23 MED ORDER — CLOPIDOGREL BISULFATE 75 MG PO TABS
75.0000 mg | ORAL_TABLET | Freq: Every day | ORAL | Status: DC
  Administered 2011-05-24 – 2011-05-27 (×4): 75 mg via ORAL
  Filled 2011-05-23 (×4): qty 1

## 2011-05-23 MED ORDER — CLOPIDOGREL BISULFATE 75 MG PO TABS
600.0000 mg | ORAL_TABLET | Freq: Once | ORAL | Status: AC
  Administered 2011-05-23: 600 mg via ORAL
  Filled 2011-05-23: qty 8

## 2011-05-23 MED ORDER — ONDANSETRON HCL 4 MG/2ML IJ SOLN: 4.0000 mg | Freq: Four times a day (QID) | INTRAMUSCULAR | Status: DC | PRN

## 2011-05-23 MED ORDER — PANTOPRAZOLE SODIUM 40 MG PO TBEC
40.0000 mg | DELAYED_RELEASE_TABLET | Freq: Every day | ORAL | Status: DC
  Administered 2011-05-24 – 2011-05-27 (×3): 40 mg via ORAL
  Filled 2011-05-23 (×3): qty 1

## 2011-05-23 MED ORDER — NITROGLYCERIN 2 % TD OINT
1.0000 [in_us] | TOPICAL_OINTMENT | Freq: Four times a day (QID) | TRANSDERMAL | Status: DC
  Administered 2011-05-23 – 2011-05-25 (×7): 1 [in_us] via TOPICAL
  Filled 2011-05-23: qty 30

## 2011-05-23 MED ORDER — ASPIRIN 300 MG RE SUPP
300.0000 mg | RECTAL | Status: AC
  Filled 2011-05-23: qty 1

## 2011-05-23 NOTE — Progress Notes (Signed)
ANTICOAGULATION CONSULT NOTE - Follow Up Consult  Pharmacy Consult for Heparin Indication: chest pain/ACS  Assessment: 77 y.o. M on heparin for ACS sx with a therapeutic HL this evening. (HL 0.37, goal of 0.3-0.7). No s/sx of bleeding noted.   Goal of Therapy:  Heparin level 0.3-0.7 units/ml   Plan:  1) Continue heparin drip rate at 800 units/hr (8 ml/hr) 2) Will continue to monitor for any signs/symptoms of bleeding and will follow up with heparin level in the a.m to confirm therapeutic  Georgina Pillion, PharmD, BCPS 05/23/2011 8:53 PM     Allergies  Allergen Reactions  . Doxycycline Hyclate     REACTION: unspecified  . Promethazine Hcl     Phenergan  . Tetanus Toxoid     REACTION: Rash  . Tetracycline     REACTION: Esophagitis    Patient Measurements:   Heparin Dosing Weight: 79.5 kg  Vital Signs: Temp: 98.2 F (36.8 C) (01/05 1748) Temp src: Oral (01/05 1748) BP: 151/87 mmHg (01/05 1748) Pulse Rate: 58  (01/05 1748)  Labs:  Basename 05/23/11 2004 05/23/11 1709 05/23/11 1648 05/23/11 1100  HGB 14.1 -- -- 13.7  HCT 41.0 -- -- 39.6  PLT 180 -- -- 168  APTT 69* -- -- --  LABPROT 13.5 -- -- --  INR 1.01 -- -- --  HEPARINUNFRC 0.37 -- -- --  CREATININE -- -- -- 1.57*  CKTOTAL -- 310* 223 69  CKMB -- 24.8* 3.0 1.8  TROPONINI -- 2.34* <0.30 <0.30   The CrCl is unknown because both a height and weight (above a minimum accepted value) are required for this calculation.   Medications:  Prescriptions prior to admission  Medication Sig Dispense Refill  . allopurinol (ZYLOPRIM) 300 MG tablet Take 300 mg by mouth daily.        Marland Kitchen amiodarone (PACERONE) 100 MG tablet Take 100 mg by mouth daily.        Marland Kitchen aspirin EC 325 MG tablet Take 325 mg by mouth daily.        Marland Kitchen levothyroxine (SYNTHROID, LEVOTHROID) 100 MCG tablet Take 100 mcg by mouth daily.        Marland Kitchen omeprazole (PRILOSEC) 20 MG capsule Take 20 mg by mouth daily.        Marland Kitchen tiotropium (SPIRIVA) 18 MCG inhalation  capsule Place 18 mcg into inhaler and inhale daily.         Scheduled:    . allopurinol  300 mg Oral Daily  . amiodarone  100 mg Oral Daily  . aspirin  324 mg Oral NOW   Or  . aspirin  300 mg Rectal NOW  . aspirin EC  325 mg Oral Daily  . aspirin EC  81 mg Oral Daily  . clopidogrel  600 mg Oral Once  . clopidogrel  75 mg Oral Q breakfast  . heparin      . levothyroxine  100 mcg Oral Daily  .  morphine injection  4 mg Intravenous Once  . nitroGLYCERIN  0.5 inch Topical Once  . nitroGLYCERIN  1 inch Topical Q6H  . ondansetron (ZOFRAN) IV  4 mg Intravenous Once  . pantoprazole  40 mg Oral Q1200  . rosuvastatin  10 mg Oral Daily  . sodium chloride  3 mL Intravenous Q12H  . tiotropium  18 mcg Inhalation Daily  . DISCONTD: nitroGLYCERIN  1 inch Topical Q6H

## 2011-05-23 NOTE — ED Notes (Signed)
Patient hungry.  Heart healthy diet tray ordered for pt

## 2011-05-23 NOTE — ED Provider Notes (Signed)
History     CSN: [id number]  Arrival date & time 05/23/11  1005   First MD Initiated Contact with Patient 05/23/11 1040      Chief Complaint  Patient presents with  . Chest Pain    (Consider location/radiation/quality/duration/timing/severity/associated sxs/prior treatment) The history is provided by the patient.   Patient states that he was in the shower this morning around 9 AM when he had sudden onset of generalized weakness, pain in the left and right shoulders/upper back, and a sensation of tightness in his throat.   He has a history of CAD, and is status post 5 vessel CABG. He is followed by Dr. Lewayne Bunting with [facility] cardiology. He was last seen in August, and his last stress test was in January 2012. His anginal symptoms prior to CABG consisted of back pain and shortness of breath; he did not experience pain in his anterior chest. His symptoms feel somewhat similar today, but are not completely identical. He states that his previous pain seemed to radiate more around the lower ribs, and his pain today does not. His previous pain is described as dull and achy, whereas today's pain is more of a pressure/heaviness sensation. He took one nitroglycerin at home prior to arrival, which did not relieve his symptoms. He takes a 325 mg aspirin daily, and had taken this prior to arrival. He denies associated shortness of breath, but has been experiencing a sensation of tightness in his throat. Denies diaphoresis and vomiting but has had nausea. His symptoms have not changed since onset this morning.  Past medical history significant for hypertension, CAD, atrial fibrillation, bradycardia, AAA, PVD, history of CABG.  Past Medical History  Diagnosis Date  . Unspecified disorder of thyroid 12/05/2008  . HYPERLIPIDEMIA 09/19/2008  . GOUT 09/19/2008  . ANEMIA-IRON DEFICIENCY 09/19/2008  . HYPERTENSION 09/19/2008  . CAD 12/05/2008  . Campath-induced atrial fibrillation 04/19/2008  . ATRIAL FLUTTER,  CHRONIC 12/05/2008  . BRADYCARDIA 12/05/2008  . AAA 09/19/2008  . PERIPHERAL VASCULAR DISEASE 09/19/2008  . GERD 09/19/2008  . DIVERTICULOSIS, COLON 09/19/2008  . RENAL INSUFFICIENCY 09/19/2008  . LOW BACK PAIN 09/19/2008  . BACK PAIN 03/03/2010  . Other chest pain 04/06/2008  . CHEST PAIN 03/03/2010  . EPIGASTRIC PAIN 05/03/2008  . FLANK PAIN, LEFT 01/11/2009  . PSA, INCREASED 09/19/2008  . COLONIC POLYPS, HX OF 09/19/2008  . GASTROINTESTINAL HEMORRHAGE, HX OF 04/19/2008  . NEPHROLITHIASIS, HX OF 01/11/2009  . CORONARY ARTERY BYPASS GRAFT, HX OF 12/05/2008  . ANXIETY 06/13/2010  . Postnasal drip 07/15/2010  . GASTRITIS 07/21/2010    Past Surgical History  Procedure Date  . Aorto-femoral bypass x5 - stress test neg 9/08   . Coronary artery bypass graft   . S/p afib ablation     Family History  Problem Relation Age of Onset  . Diabetes Father   . Diabetes Sister   . Multiple myeloma Mother   . Heart disease Paternal Uncle   . Heart disease Maternal Uncle   . Lymphoma Sister     half-sister    History  Substance Use Topics  . Smoking status: Former Smoker -- 4.0 packs/day for 31 years    Quit date: 03/19/1983  . Smokeless tobacco: Not on file  . Alcohol Use: Yes     2      Review of Systems  Constitutional: Negative for fever and diaphoresis.  HENT: Positive for neck pain.   Respiratory: Positive for choking. Negative for shortness of breath.  Sensation of choking  Cardiovascular: Negative for chest pain.  Gastrointestinal: Positive for nausea. Negative for vomiting and abdominal pain.  Musculoskeletal: Positive for back pain.  Skin: Negative for rash.  Neurological: Positive for weakness and light-headedness. Negative for syncope.    Allergies  Doxycycline hyclate; Promethazine hcl; Tetanus toxoid; and Tetracycline  Home Medications   Current Outpatient Rx  Name Route Sig Dispense Refill  . ALLOPURINOL 300 MG PO TABS Oral Take 300 mg by mouth daily.      .  AMIODARONE HCL 100 MG PO TABS Oral Take 100 mg by mouth daily.      . ASPIRIN EC 325 MG PO TBEC Oral Take 325 mg by mouth daily.      Marland Kitchen LEVOTHYROXINE SODIUM 100 MCG PO TABS Oral Take 100 mcg by mouth daily.      Marland Kitchen OMEPRAZOLE 20 MG PO CPDR Oral Take 20 mg by mouth daily.      Marland Kitchen TIOTROPIUM BROMIDE MONOHYDRATE 18 MCG IN CAPS Inhalation Place 18 mcg into inhaler and inhale daily.        BP 125/80  Pulse 72  Temp(Src) 97.8 F (36.6 C) (Oral)  Resp 16  SpO2 93%  Physical Exam  Nursing note and vitals reviewed. Constitutional: He is oriented to person, place, and time. He appears well-developed and well-nourished. No distress.       Uncomfortable appearing  HENT:  Head: Normocephalic and atraumatic.  Eyes: Conjunctivae and EOM are normal. Right eye exhibits no discharge. Left eye exhibits no discharge.  Neck: Normal range of motion.       No carotid bruits appreciated on auscultation  Cardiovascular: Normal rate, regular rhythm and normal heart sounds.   Pulmonary/Chest: Effort normal and breath sounds normal. No respiratory distress. He has no wheezes. He exhibits no tenderness.  Abdominal: Soft. Bowel sounds are normal. There is no tenderness. There is no rebound and no guarding.  Musculoskeletal: Normal range of motion. He exhibits no edema.       Nontender to palpation over bilateral shoulders, upper back; no deformity noted  Neurological: He is alert and oriented to person, place, and time.  Skin: Skin is warm and dry. No rash noted. He is not diaphoretic.  Psychiatric: He has a normal mood and affect.    ED Course  Procedures (including critical care time)   Date: 05/23/2011  Rate: 73  Rhythm: normal sinus rhythm  QRS Axis: normal  Intervals: normal  ST/T Wave abnormalities: possible anterior infarct age indeterminate  Conduction Disutrbances:none  Narrative Interpretation:   Old EKG Reviewed: as compared with Jun 02, 2010 no significant changes  Labs Reviewed  BASIC  METABOLIC PANEL - Abnormal; Notable for the following:    Glucose, Bld 116 (*)    Creatinine, Ser 1.57 (*)    GFR calc non Af Amer 42 (*)    GFR calc Af Amer 48 (*)    All other components within normal limits  POCT I-STAT TROPONIN I - Abnormal; Notable for the following:    Troponin i, poc 0.54 (*)    All other components within normal limits  CBC  DIFFERENTIAL  CARDIAC PANEL(CRET KIN+CKTOT+MB+TROPI)  POCT I-STAT TROPONIN I  I-STAT TROPONIN I  I-STAT TROPONIN I   Dg Chest Port 1 View  05/23/2011  *RADIOLOGY REPORT*  Clinical Data: Chest pain  PORTABLE CHEST - 1 VIEW  Comparison:  Imaging CT chest dated 09/22/2010  Findings: Lungs are clear. No pleural effusion or pneumothorax.  The heart is top  normal in size. Postsurgical changes related to prior CABG.  IMPRESSION: No evidence of acute cardiopulmonary disease.  Original Report Authenticated By: Charline Bills, M.D.     1. Unstable angina       MDM  12:45 PM Pt's sx are similar to pt's pre-CABG angina. Initial trop 0.05; will repeat. Discussed with Dr. Myrtis Ser with [facility]. He will consult and make recs.  2:43 PM Pt to be admitted to cardiology for eval for possible unstable angina. 3 hr troponin pending.     Grant Fontana, [place] 05/23/11 [phone]

## 2011-05-23 NOTE — ED Notes (Signed)
Reports onset approx 45 mins ago of generalized weakness, right shoulder pain and neck/jaw pain. ekg being done at triage.

## 2011-05-23 NOTE — ED Notes (Signed)
Pt talking with family.  Rates pain at 1/10.

## 2011-05-23 NOTE — Progress Notes (Signed)
Critical CKMB 24.8 and Troponin 2.34 received from the lab at 1820, patient is asymptomatic.   Dyna Dunn made aware, no new orders at this time.  Lethea Killings, RN

## 2011-05-23 NOTE — ED Notes (Signed)
02 applied at 2lpm via [place] 

## 2011-05-23 NOTE — ED Notes (Signed)
Family at bedside. 

## 2011-05-23 NOTE — ED Notes (Signed)
Cardiology at bedside.

## 2011-05-23 NOTE — Progress Notes (Signed)
ANTICOAGULATION CONSULT NOTE - Initial Consult  Pharmacy Consult for heparin Indication: ACS  Allergies  Allergen Reactions  . Doxycycline Hyclate     REACTION: unspecified  . Promethazine Hcl     Phenergan  . Tetanus Toxoid     REACTION: Rash  . Tetracycline     REACTION: Esophagitis    Patient Measurements: Ht: 5'10  Wt: 175 pounds (79.5kg) Adjusted Body Weight: 79.5kg  Vital Signs: Temp: 97.8 F (36.6 C) (01/05 1010) Temp src: Oral (01/05 1010) BP: 138/78 mmHg (01/05 1230) Pulse Rate: 50  (01/05 1230)  Labs:  Basename 05/23/11 1100  HGB 13.7  HCT 39.6  PLT 168  APTT --  LABPROT --  INR --  HEPARINUNFRC --  CREATININE 1.57*  CKTOTAL 69  CKMB 1.8  TROPONINI <0.30   The CrCl is unknown because both a height and weight (above a minimum accepted value) are required for this calculation.  Medical History: Past Medical History  Diagnosis Date  . Unspecified disorder of thyroid 12/05/2008  . HYPERLIPIDEMIA 09/19/2008  . GOUT 09/19/2008  . ANEMIA-IRON DEFICIENCY 09/19/2008  . HYPERTENSION 09/19/2008  . CAD 12/05/2008  . Campath-induced atrial fibrillation 04/19/2008  . ATRIAL FLUTTER, CHRONIC 12/05/2008  . BRADYCARDIA 12/05/2008  . AAA 09/19/2008  . PERIPHERAL VASCULAR DISEASE 09/19/2008  . GERD 09/19/2008  . DIVERTICULOSIS, COLON 09/19/2008  . RENAL INSUFFICIENCY 09/19/2008  . LOW BACK PAIN 09/19/2008  . BACK PAIN 03/03/2010  . Other chest pain 04/06/2008  . CHEST PAIN 03/03/2010  . EPIGASTRIC PAIN 05/03/2008  . FLANK PAIN, LEFT 01/11/2009  . PSA, INCREASED 09/19/2008  . COLONIC POLYPS, HX OF 09/19/2008  . GASTROINTESTINAL HEMORRHAGE, HX OF 04/19/2008  . NEPHROLITHIASIS, HX OF 01/11/2009  . CORONARY ARTERY BYPASS GRAFT, HX OF 12/05/2008  . ANXIETY 06/13/2010  . Postnasal drip 07/15/2010  . GASTRITIS 07/21/2010    Assessment: 77 yo male with concern of ACS to start on heparin. Patient has been given heparin 5000 units SQ per RN.  Goal of Therapy:  Heparin level 0.3-0.7  units/ml   Plan:  1) No heparin bolus 2) Heparin infusion at 800 units/hr (~10 units/kg/hr) with heparin level in 8 hrs and daily with CBC daily  Benny Lennert 05/23/2011,1:11 PM

## 2011-05-23 NOTE — Progress Notes (Signed)
CRITICAL VALUE ALERT  Critical value received:  CKMB 24.8, Troponin 2.34  Date of notification:  05/23/11  Time of notification:  1820  Critical value read back:yes  Nurse who received alert:  Lethea Killings  MD notified (1st page):  Enid Baas, [place]  Time of first page:  1825  MD notified (2nd page):  Time of second page:  Responding MD: Enid Baas  Time MD responded:  [phone]

## 2011-05-23 NOTE — H&P (Signed)
History and Physical  Patient ID: Omar Benson MRN: [id number], SOB: August 21, 1934 77 y.o. Date of Encounter: 05/23/2011, 1:33 PM  Primary Physician: Oliver Barre, MD, MD Primary Cardiologist: Sharrell Ku, MD  Chief Complaint: Shoulder pain  Reason for Admission: 77 year old male, status post 5 vessel CABG in 2004, followed by radiofrequency ablation procedure 2 weeks later, by Dr. Sharrell Ku, for treatment of atrial fibrillation, now presents to the ED with significant bilateral shoulder pain and associated weakness.  HPI: Patient developed symptoms this morning at approximately 9 AM, as he was preparing to dress following a shower. He initially began feeling weak, then developed a "deep ache" across both shoulders, extending into the RUE and into the neck. He denied any associated chest discomfort, and denies any recent development of such, either at rest or with exertion. He took 3 nitroglycerin tablets, with no palpable defect. He denied any associated SOB, diaphoresis, or nausea.  In the ER, he was treated with 4 mg of morphine and then placed on NTP. This has improved his discomfort, somewhat, but he continues to have ongoing right shoulder pain. He also received a 5000 unit bolus of subcutaneous heparin.  Admission EKG indicated NSR with nonspecific ST abnormalities.  Past Medical History  Diagnosis Date  . Unspecified disorder of thyroid 12/05/2008  . HYPERLIPIDEMIA 09/19/2008  . GOUT 09/19/2008  . ANEMIA-IRON DEFICIENCY 09/19/2008  . HYPERTENSION 09/19/2008  . CAD 12/05/2008  . Campath-induced atrial fibrillation 04/19/2008  . ATRIAL FLUTTER, CHRONIC 12/05/2008  . BRADYCARDIA 12/05/2008  . AAA 09/19/2008  . PERIPHERAL VASCULAR DISEASE 09/19/2008  . GERD 09/19/2008  . DIVERTICULOSIS, COLON 09/19/2008  . RENAL INSUFFICIENCY 09/19/2008  . LOW BACK PAIN 09/19/2008  . BACK PAIN 03/03/2010  . Other chest pain 04/06/2008  . CHEST PAIN 03/03/2010  . EPIGASTRIC PAIN 05/03/2008  . FLANK PAIN, LEFT 01/11/2009    . PSA, INCREASED 09/19/2008  . COLONIC POLYPS, HX OF 09/19/2008  . GASTROINTESTINAL HEMORRHAGE, HX OF 04/19/2008  . NEPHROLITHIASIS, HX OF 01/11/2009  . CORONARY ARTERY BYPASS GRAFT, HX OF 12/05/2008  . ANXIETY 06/13/2010  . Postnasal drip 07/15/2010  . GASTRITIS 07/21/2010     Surgical History:  Past Surgical History  Procedure Date  . 5v CABG, 10/04 - stress test neg 9/08   . S/p afib ablation, 2004      Home Meds: Prior to Admission medications   Medication Sig Start Date End Date Taking? Authorizing Provider  allopurinol (ZYLOPRIM) 300 MG tablet Take 300 mg by mouth daily.     Yes Historical Provider, MD  amiodarone (PACERONE) 100 MG tablet Take 100 mg by mouth daily.     Yes Historical Provider, MD  aspirin EC 325 MG tablet Take 325 mg by mouth daily.     Yes Historical Provider, MD  levothyroxine (SYNTHROID, LEVOTHROID) 100 MCG tablet Take 100 mcg by mouth daily.     Yes Historical Provider, MD  omeprazole (PRILOSEC) 20 MG capsule Take 20 mg by mouth daily.     Yes Historical Provider, MD  tiotropium (SPIRIVA) 18 MCG inhalation capsule Place 18 mcg into inhaler and inhale daily.     Yes Historical Provider, MD    Allergies:  Allergies  Allergen Reactions  . Doxycycline Hyclate     REACTION: unspecified  . Promethazine Hcl     Phenergan  . Tetanus Toxoid     REACTION: Rash  . Tetracycline     REACTION: Esophagitis    History   Social History  .  Marital Status: Married    Spouse Name: Victorino Dike    Number of Children: N/A  . Years of Education: N/A   Occupational History  . self employeed - Contractor    Social History Main Topics  . Smoking status: Former Smoker -- 4.0 packs/day for 31 years    Quit date: 03/19/1983  . Smokeless tobacco: Not on file  . Alcohol Use: Yes     2  . Drug Use: No  . Sexually Active: Not on file   Other Topics Concern  . Not on file   Social History Narrative   Married lives with his wife     Family History  Problem Relation  Age of Onset  . Diabetes Father   . Diabetes Sister   . Multiple myeloma Mother   . Heart disease Paternal Uncle   . Heart disease Maternal Uncle   . Lymphoma Sister     half-sister    Review of Systems: General: negative for chills, fever, night sweats or weight changes.  Cardiovascular: negative for chest pain, dyspnea on exertion, edema, orthopnea, palpitations, paroxysmal nocturnal dyspnea or shortness of breath, or intermittent claudication Dermatological: negative for rash Respiratory: negative for cough or wheezing Urologic: negative for hematuria Abdominal: negative for nausea, vomiting, diarrhea, bright red blood per rectum, melena, or hematemesis Neurologic: negative for visual changes, syncope, or dizziness All other systems reviewed and are otherwise negative except as noted above.  Labs:   Lab Results  Component Value Date   WBC 4.4 05/23/2011   HGB 13.7 05/23/2011   HCT 39.6 05/23/2011   MCV 92.3 05/23/2011   PLT 168 05/23/2011    Lab 05/23/11 1100  NA 138  K 3.8  CL 105  CO2 25  BUN 20  CREATININE 1.57*  CALCIUM 9.1  PROT --  BILITOT --  ALKPHOS --  ALT --  AST --  GLUCOSE 116*    Basename 05/23/11 1100  CKTOTAL 69  CKMB 1.8  TROPONINI <0.30   Lab Results  Component Value Date   CHOL 170 12/08/2010   HDL 38.20* 12/08/2010   LDLCALC 93 12/08/2010   TRIG 196.0* 12/08/2010   No results found for this basename: DDIMER    Radiology/Studies:  Dg Chest Port 1 View  05/23/2011  *RADIOLOGY REPORT*  Clinical Data: Chest pain  PORTABLE CHEST - 1 VIEW  Comparison: [facility] CT chest dated 09/22/2010  Findings: Lungs are clear. No pleural effusion or pneumothorax.  The heart is top normal in size. Postsurgical changes related to prior CABG.  IMPRESSION: No evidence of acute cardiopulmonary disease.  Original Report Authenticated By: Charline Bills, M.D.     EKG: NSR at 73 bpm; borderline LAD; less than 1 mm ST segment depression in anteroseptal  leads  Physical Exam: Blood pressure 138/78, pulse 50, temperature 97.8 F (36.6 C), temperature source Oral, resp. rate 16, SpO2 99.00%. General: A 77 year old male, lying supine, in no acute distress. Head: Normocephalic, atraumatic, sclera non-icteric, no xanthomas, nares are without discharge.  Neck: Negative for carotid bruits. JVD not elevated. Lungs: Clear bilaterally to auscultation without wheezes, rales, or rhonchi. Breathing is unlabored. Heart: RRR with S1 S2. No murmurs, rubs, or gallops appreciated. Abdomen: Soft, non-tender, non-distended with normoactive bowel sounds. No hepatomegaly. No rebound/guarding. No obvious abdominal masses. Msk:  Strength and tone appear normal for age. Extremities: Palpable bilateral femoral pulses, with soft right-sided bruit. No edema.  Distal pedal pulses are 2+ and equal bilaterally. Neuro: Alert and oriented X 3.  Moves all extremities spontaneously. Psych:  Responds to questions appropriately with a normal affect.     ASSESSMENT AND PLAN:  1  unstable angina pectoris 2  CAD  A  5 vessel CABG, 2004  B  negative stress test, 2008 3  history of atrial fibrillation  A  maintaining NSR on amiodarone  B  STATUS post RFA, 2004 4  HTN 5  HLD 6  COPD  A  remote history of tobacco 7  Hypothyroidism 8  Sinus bradycardia  PLAN:  Plan is to admit patient for continued close monitoring and rule out of myocardial infarction with serial cardiac markers. Symptoms are quite worrisome for UAP, and plan is to proceed with cardiac catheterization on Monday. In the meanwhile, we will continue treatment with nitroglycerin and initiate IV heparin, here in the ED. Aspirin will be added to his regimen, and beta blocker will be deferred at present, secondary to sinus bradycardia. We will, however, continue home dose of amiodarone.  Signed, Gene Serpe PA-C 05/23/2011, 1:33 PM Patient seen and examined. I agree with the assessment and plan as detailed above.  See also my additional thoughts below.  I personally evaluated the patient in the emergency room.  I was quite concerned about his pain. His EKGs showed nonspecific changes. Heparin was started. We'll be watching his troponins carefully. If he has any significant ongoing pain or any significant rise in his troponins we will consider early cardiac catheterization  Willa Rough, MD, [facility] 05/23/2011 3:46 PM

## 2011-05-23 NOTE — ED Notes (Signed)
Grant Fontana PA informed of Trop Istat results. ED-Lab.

## 2011-05-23 NOTE — ED Notes (Signed)
Cardiology PA at bedside. 

## 2011-05-24 ENCOUNTER — Other Ambulatory Visit: Payer: Self-pay

## 2011-05-24 DIAGNOSIS — I214 Non-ST elevation (NSTEMI) myocardial infarction: Secondary | ICD-10-CM

## 2011-05-24 DIAGNOSIS — I200 Unstable angina: Secondary | ICD-10-CM

## 2011-05-24 LAB — CARDIAC PANEL(CRET KIN+CKTOT+MB+TROPI)
CK, MB: 24.8 ng/mL (ref 0.3–4.0)
CK, MB: 71.9 ng/mL (ref 0.3–4.0)
Relative Index: 8.8 — ABNORMAL HIGH (ref 0.0–2.5)
Relative Index: 8 — ABNORMAL HIGH (ref 0.0–2.5)
Total CK: 310 U/L — ABNORMAL HIGH (ref 7–232)
Total CK: 1050 U/L — ABNORMAL HIGH (ref 7–232)
Troponin I: 11.53 ng/mL (ref <0.30)
Troponin I: 15.14 ng/mL (ref <0.30)
Troponin I: 19.83 ng/mL (ref <0.30)

## 2011-05-24 LAB — COMPREHENSIVE METABOLIC PANEL
ALT: 17 U/L (ref 0–53)
AST: 40 U/L — ABNORMAL HIGH (ref 0–37)
Albumin: 3.8 g/dL (ref 3.5–5.2)
Calcium: 9.6 mg/dL (ref 8.4–10.5)
Sodium: 139 mEq/L (ref 135–145)
Total Protein: 7.4 g/dL (ref 6.0–8.3)

## 2011-05-24 LAB — LIPID PANEL
HDL: 43 mg/dL (ref >39)
LDL Cholesterol: 96 mg/dL (ref 0–99)
Total CHOL/HDL Ratio: 4 RATIO

## 2011-05-24 LAB — TSH: TSH: 2.75 u[IU]/mL (ref 0.350–4.500)

## 2011-05-24 LAB — CBC
HCT: 38.1 % — ABNORMAL LOW (ref 39.0–52.0)
MCV: 92.3 fL (ref 78.0–100.0)
RBC: 4.13 MIL/uL — ABNORMAL LOW (ref 4.22–5.81)
WBC: 6 10*3/uL (ref 4.0–10.5)

## 2011-05-24 MED ORDER — SODIUM CHLORIDE 0.9 % IJ SOLN: 3.0000 mL | INTRAMUSCULAR | Status: DC | PRN

## 2011-05-24 MED ORDER — MORPHINE SULFATE 2 MG/ML IJ SOLN
2.0000 mg | INTRAMUSCULAR | Status: DC | PRN
Start: 2011-05-24 — End: 2011-05-27

## 2011-05-24 MED ORDER — DIAZEPAM 5 MG PO TABS
5.0000 mg | ORAL_TABLET | ORAL | Status: AC
  Administered 2011-05-25: 5 mg via ORAL
  Filled 2011-05-24: qty 1

## 2011-05-24 MED ORDER — SODIUM CHLORIDE 0.9 % IJ SOLN: 3.0000 mL | Freq: Two times a day (BID) | INTRAMUSCULAR | Status: DC

## 2011-05-24 MED ORDER — ASPIRIN 81 MG PO CHEW
324.0000 mg | CHEWABLE_TABLET | ORAL | Status: AC
  Administered 2011-05-25: 324 mg via ORAL
  Filled 2011-05-24: qty 4

## 2011-05-24 MED ORDER — OXYCODONE HCL 5 MG PO TABS
5.0000 mg | ORAL_TABLET | ORAL | Status: DC | PRN
  Administered 2011-05-24 (×3): 5 mg via ORAL
  Filled 2011-05-24 (×3): qty 1

## 2011-05-24 MED ORDER — SODIUM CHLORIDE 0.9 % IV SOLN
1.0000 mL/kg/h | INTRAVENOUS | Status: DC
  Administered 2011-05-25: 1 mL/kg/h via INTRAVENOUS

## 2011-05-24 MED ORDER — SODIUM CHLORIDE 0.9 % IV SOLN: 250.0000 mL | INTRAVENOUS | Status: DC | PRN

## 2011-05-24 NOTE — ED Provider Notes (Signed)
Medical screening examination/treatment/procedure(s) were conducted as a shared visit with non-physician practitioner(s) and myself.  I personally evaluated the patient during the encounter Patient with long heart history who had symptoms suggestive of unstable angina today. Second set of troponins bumped consistent with an NSTEMI  Gwyneth Sprout, MD 05/24/11 0730

## 2011-05-24 NOTE — Progress Notes (Signed)
ANTICOAGULATION CONSULT NOTE - Follow Up Consult  Pharmacy Consult for Heparin Indication: chest pain/ACS   Allergies  Allergen Reactions  . Doxycycline Hyclate     REACTION: unspecified  . Promethazine Hcl     Phenergan  . Tetanus Toxoid     REACTION: Rash  . Tetracycline     REACTION: Esophagitis    Patient Measurements: Height: 5\' 10"  (177.8 cm) Weight: 175 lb (79.379 kg) IBW/kg (Calculated) : 73  Heparin Dosing Weight: 79.5 kg  Vital Signs: Temp: 98.3 F (36.8 C) (01/06 0600) BP: 113/62 mmHg (01/06 0600) Pulse Rate: 51  (01/06 0600)  Labs:  Basename 05/24/11 0500 05/23/11 2326 05/23/11 2004 05/23/11 2003 05/23/11 1709 05/23/11 1100  HGB 12.9* -- 14.1 -- -- --  HCT 38.1* -- 41.0 -- -- 39.6  PLT 162 -- 180 -- -- 168  APTT -- -- 69* -- -- --  LABPROT -- -- 13.5 -- -- --  INR -- -- 1.01 -- -- --  HEPARINUNFRC 0.31 -- 0.37 -- -- --  CREATININE -- -- 1.57* -- -- 1.57*  CKTOTAL -- 821* -- 620* 310* --  CKMB -- 71.9* -- 53.4* 24.8* --  TROPONINI -- 11.53* -- 7.79* 2.34* --   Estimated Creatinine Clearance: 42.6 ml/min (by C-G formula based on Cr of 1.57).  Scheduled:     . allopurinol  300 mg Oral Daily  . amiodarone  100 mg Oral Daily  . aspirin  324 mg Oral NOW   Or  . aspirin  300 mg Rectal NOW  . aspirin EC  325 mg Oral Daily  . aspirin EC  81 mg Oral Daily  . clopidogrel  600 mg Oral Once  . clopidogrel  75 mg Oral Q breakfast  . heparin      . levothyroxine  100 mcg Oral Daily  .  morphine injection  4 mg Intravenous Once  . nitroGLYCERIN  0.5 inch Topical Once  . nitroGLYCERIN  1 inch Topical Q6H  . ondansetron (ZOFRAN) IV  4 mg Intravenous Once  . pantoprazole  40 mg Oral Q1200  . rosuvastatin  10 mg Oral Daily  . sodium chloride  3 mL Intravenous Q12H  . tiotropium  18 mcg Inhalation Daily  . DISCONTD: nitroGLYCERIN  1 inch Topical Q6H    Assessment: 77 y.o. M on heparin for ACS sx with a therapeutic HL this evening. (HL 0.37, goal of  0.3-0.7). No s/sx of bleeding noted.   Goal of Therapy:  Heparin level 0.3-0.7 units/ml   Plan:   Continue heparin drip rate at 800 units/hr (8 ml/hr)  Harland German, Pharm D 05/24/2011 10:51 AM

## 2011-05-24 NOTE — Progress Notes (Signed)
Increased Troponin noted. Paged MD on call. MD will come to 3700. Reported ongoing back pain reported by the patient.

## 2011-05-24 NOTE — Progress Notes (Signed)
Patient ID: Omar Benson, male   DOB: 1935-04-28, 77 y.o.   MRN: [id number] SUBJECTIVE: The patient is feeling better today. His EKG does not show any diagnostic change. However he has had significant increase in his troponin. His symptoms are stable. He's had a mild headache related to his nitrate therapy. He has chronic back pain. It is very difficult for him to distinguish his back pain from the symptoms that brought him into the hospital. However he told me when he came in that he had some chest pressure and radiation to the neck. He is not having that at this time.  Filed Vitals:   05/23/11 2200 05/24/11 0600 05/24/11 0911 05/24/11 1217  BP: 177/89 113/62  185/95  Pulse: 51 51    Temp: 97.5 F (36.4 C) 98.3 F (36.8 C)    TempSrc:      Resp: 16 18    Height:      Weight:      SpO2: 98% 96% 97%     Intake/Output Summary (Last 24 hours) at 05/24/11 1320 Last data filed at 05/24/11 1041  Gross per 24 hour  Intake      3 ml  Output      0 ml  Net      3 ml    LABS: Basic Metabolic Panel:  Basename 05/23/11 2004 05/23/11 1100  NA 139 138  K 4.5 3.8  CL 105 105  CO2 24 25  GLUCOSE 126* 116*  BUN 18 20  CREATININE 1.57* 1.57*  CALCIUM 9.6 9.1  MG -- --  PHOS -- --   Liver Function Tests:  [facility] 05/23/11 2004  AST 40*  ALT 17  ALKPHOS 108  BILITOT 0.5  PROT 7.4  ALBUMIN 3.8   No results found for this basename: LIPASE:2,AMYLASE:2 in the last 72 hours CBC:  Basename 05/24/11 0500 05/23/11 2004 05/23/11 1100  WBC 6.0 5.0 --  NEUTROABS -- 3.3 3.0  HGB 12.9* 14.1 --  HCT 38.1* 41.0 --  MCV 92.3 92.3 --  PLT 162 180 --   Cardiac Enzymes:  Basename 05/23/11 2326 05/23/11 2003 05/23/11 1709  CKTOTAL 821* 620* 310*  CKMB 71.9* 53.4* 24.8*  CKMBINDEX -- -- --  TROPONINI 11.53* 7.79* 2.34*   BNP: No components found with this basename: POCBNP:3 D-Dimer: No results found for this basename: DDIMER:2 in the last 72 hours Hemoglobin A1C: No results found for  this basename: HGBA1C in the last 72 hours Fasting Lipid Panel:  Basename 05/24/11 0500  CHOL 174  HDL 43  LDLCALC 96  TRIG 176*  CHOLHDL 4.0  LDLDIRECT --   Thyroid Function Tests:  Basename 05/23/11 2004  TSH 2.750  T4TOTAL --  T3FREE --  THYROIDAB --    RADIOLOGY: Dg Chest Port 1 View  05/23/2011  *RADIOLOGY REPORT*  Clinical Data: Chest pain  PORTABLE CHEST - 1 VIEW  Comparison: [facility] CT chest dated 09/22/2010  Findings: Lungs are clear. No pleural effusion or pneumothorax.  The heart is top normal in size. Postsurgical changes related to prior CABG.  IMPRESSION: No evidence of acute cardiopulmonary disease.  Original Report Authenticated By: Charline Bills, M.D.    PHYSICAL EXAM  Patient is oriented to person time and place. Affect is normal. Head is atraumatic. There is no xanthelasma. Lungs are clear. Respiratory effort is nonlabored. Cardiac exam reveals an S1 and S2. There no clicks or significant murmurs. The abdomen is soft. There is no peripheral edema. There are no  musculoskeletal deformities. There are no skin rashes.  TELEMETRY: I have reviewed telemetry. At this time the rhythm appears to be sinus.   ASSESSMENT AND PLAN:  Active Problems:  HYPERLIPIDEMIA   Non-STEMI (non-ST elevated myocardial infarction)   The patient has had a definite rise in his troponin. His total CPK is also elevated. The last value was last evening. I have ordered another set of enzymes to see if we have obtained a peak. Because his symptoms are completely resolved I will wait for catheterization tomorrow morning. The patient underwent CABG in 2004. He is on aspirin and heparin.Plavix was started yesterday in and is being continued. Cardiac cath orders have been written.    Willa Rough 05/24/2011 1:20 PM

## 2011-05-25 ENCOUNTER — Other Ambulatory Visit: Payer: Self-pay

## 2011-05-25 ENCOUNTER — Encounter (HOSPITAL_COMMUNITY)
Admission: EM | Disposition: A | Discharge status: Home / Self Care | Payer: Self-pay | Source: Home / Self Care | Attending: Cardiology

## 2011-05-25 ENCOUNTER — Encounter (HOSPITAL_COMMUNITY): Payer: Self-pay | Admitting: Cardiology

## 2011-05-25 DIAGNOSIS — I2510 Atherosclerotic heart disease of native coronary artery without angina pectoris: Secondary | ICD-10-CM

## 2011-05-25 HISTORY — PX: CARDIAC CATHETERIZATION: SHX172

## 2011-05-25 HISTORY — PX: LEFT HEART CATHETERIZATION WITH CORONARY/GRAFT ANGIOGRAM: SHX5450

## 2011-05-25 LAB — CBC
MCH: 30.8 pg (ref 26.0–34.0)
MCV: 92.9 fL (ref 78.0–100.0)
Platelets: 162 10*3/uL (ref 150–400)
RDW: 13.5 % (ref 11.5–15.5)

## 2011-05-25 LAB — CARDIAC PANEL(CRET KIN+CKTOT+MB+TROPI)
CK, MB: 49.8 ng/mL (ref 0.3–4.0)
Relative Index: 7.5 — ABNORMAL HIGH (ref 0.0–2.5)
Troponin I: 17.09 ng/mL (ref <0.30)

## 2011-05-25 LAB — BASIC METABOLIC PANEL
Calcium: 9.4 mg/dL (ref 8.4–10.5)
Creatinine, Ser: 1.49 mg/dL — ABNORMAL HIGH (ref 0.50–1.35)
GFR calc Af Amer: 52 mL/min — ABNORMAL LOW (ref >90)
GFR calc non Af Amer: 44 mL/min — ABNORMAL LOW (ref >90)
Sodium: 139 mEq/L (ref 135–145)

## 2011-05-25 SURGERY — LEFT HEART CATHETERIZATION WITH CORONARY/GRAFT ANGIOGRAM
Anesthesia: LOCAL

## 2011-05-25 MED ORDER — LIDOCAINE HCL (PF) 1 % IJ SOLN
INTRAMUSCULAR | Status: AC
  Filled 2011-05-25: qty 30

## 2011-05-25 MED ORDER — MIDAZOLAM HCL 2 MG/2ML IJ SOLN
INTRAMUSCULAR | Status: AC
  Filled 2011-05-25: qty 2

## 2011-05-25 MED ORDER — ONDANSETRON HCL 4 MG/2ML IJ SOLN: 4.0000 mg | Freq: Four times a day (QID) | INTRAMUSCULAR | Status: DC | PRN

## 2011-05-25 MED ORDER — SODIUM CHLORIDE 0.9 % IV SOLN
INTRAVENOUS | Status: DC
  Administered 2011-05-25: 12:00:00 via INTRAVENOUS

## 2011-05-25 MED ORDER — NITROGLYCERIN 0.2 MG/ML ON CALL CATH LAB
INTRAVENOUS | Status: AC
  Filled 2011-05-25: qty 1

## 2011-05-25 MED ORDER — HEPARIN (PORCINE) IN NACL 2-0.9 UNIT/ML-% IJ SOLN
INTRAMUSCULAR | Status: AC
  Filled 2011-05-25: qty 2000

## 2011-05-25 MED ORDER — SODIUM CHLORIDE 0.9 % IV SOLN: INTRAVENOUS | Status: AC

## 2011-05-25 MED ORDER — ACETAMINOPHEN 325 MG PO TABS: 650.0000 mg | ORAL_TABLET | ORAL | Status: DC | PRN

## 2011-05-25 MED ORDER — FENTANYL CITRATE 0.05 MG/ML IJ SOLN
INTRAMUSCULAR | Status: AC
  Filled 2011-05-25: qty 2

## 2011-05-25 NOTE — H&P (View-Only) (Signed)
Patient ID: Omar Benson, male   DOB: 10/16/1934, 76 y.o.   MRN: [id number] SUBJECTIVE: The patient is feeling better today. His EKG does not show any diagnostic change. However he has had significant increase in his troponin. His symptoms are stable. He's had a mild headache related to his nitrate therapy. He has chronic back pain. It is very difficult for him to distinguish his back pain from the symptoms that brought him into the hospital. However he told me when he came in that he had some chest pressure and radiation to the neck. He is not having that at this time.  Filed Vitals:   05/23/11 2200 05/24/11 0600 05/24/11 0911 05/24/11 1217  BP: 177/89 113/62  185/95  Pulse: 51 51    Temp: 97.5 F (36.4 C) 98.3 F (36.8 C)    TempSrc:      Resp: 16 18    Height:      Weight:      SpO2: 98% 96% 97%     Intake/Output Summary (Last 24 hours) at 05/24/11 1320 Last data filed at 05/24/11 1041  Gross per 24 hour  Intake      3 ml  Output      0 ml  Net      3 ml    LABS: Basic Metabolic Panel:  Basename 05/23/11 2004 05/23/11 1100  NA 139 138  K 4.5 3.8  CL 105 105  CO2 24 25  GLUCOSE 126* 116*  BUN 18 20  CREATININE 1.57* 1.57*  CALCIUM 9.6 9.1  MG -- --  PHOS -- --   Liver Function Tests:  Basename 05/23/11 2004  AST 40*  ALT 17  ALKPHOS 108  BILITOT 0.5  PROT 7.4  ALBUMIN 3.8   No results found for this basename: LIPASE:2,AMYLASE:2 in the last 72 hours CBC:  Basename 05/24/11 0500 05/23/11 2004 05/23/11 1100  WBC 6.0 5.0 --  NEUTROABS -- 3.3 3.0  HGB 12.9* 14.1 --  HCT 38.1* 41.0 --  MCV 92.3 92.3 --  PLT 162 180 --   Cardiac Enzymes:  Basename 05/23/11 2326 05/23/11 2003 05/23/11 1709  CKTOTAL 821* 620* 310*  CKMB 71.9* 53.4* 24.8*  CKMBINDEX -- -- --  TROPONINI 11.53* 7.79* 2.34*   BNP: No components found with this basename: POCBNP:3 D-Dimer: No results found for this basename: DDIMER:2 in the last 72 hours Hemoglobin A1C: No results found for  this basename: HGBA1C in the last 72 hours Fasting Lipid Panel:  Basename 05/24/11 0500  CHOL 174  HDL 43  LDLCALC 96  TRIG 176*  CHOLHDL 4.0  LDLDIRECT --   Thyroid Function Tests:  Basename 05/23/11 2004  TSH 2.750  T4TOTAL --  T3FREE --  THYROIDAB --    RADIOLOGY: Dg Chest Port 1 View  05/23/2011  *RADIOLOGY REPORT*  Clinical Data: Chest pain  PORTABLE CHEST - 1 VIEW  Comparison: [facility] CT chest dated 09/22/2010  Findings: Lungs are clear. No pleural effusion or pneumothorax.  The heart is top normal in size. Postsurgical changes related to prior CABG.  IMPRESSION: No evidence of acute cardiopulmonary disease.  Original Report Authenticated By: SRIYESH KRISHNAN, M.D.    PHYSICAL EXAM  Patient is oriented to person time and place. Affect is normal. Head is atraumatic. There is no xanthelasma. Lungs are clear. Respiratory effort is nonlabored. Cardiac exam reveals an S1 and S2. There no clicks or significant murmurs. The abdomen is soft. There is no peripheral edema. There are no   musculoskeletal deformities. There are no skin rashes.  TELEMETRY: I have reviewed telemetry. At this time the rhythm appears to be sinus.   ASSESSMENT AND PLAN:  Active Problems:  HYPERLIPIDEMIA   Non-STEMI (non-ST elevated myocardial infarction)   The patient has had a definite rise in his troponin. His total CPK is also elevated. The last value was last evening. I have ordered another set of enzymes to see if we have obtained a peak. Because his symptoms are completely resolved I will wait for catheterization tomorrow morning. The patient underwent CABG in 2004. He is on aspirin and heparin.Plavix was started yesterday in and is being continued. Cardiac cath orders have been written.    Suleiman Finigan 05/24/2011 1:20 PM  

## 2011-05-25 NOTE — Interval H&P Note (Signed)
History and Physical Interval Note:  05/25/2011 9:50 AM  Omar Benson  has presented today for surgery, with the diagnosis of chest pain  The various methods of treatment have been discussed with the patient and family. After consideration of risks, benefits and other options for treatment, the patient has consented to  Procedure(s): LEFT HEART CATHETERIZATION WITH CORONARY ANGIOGRAM as a surgical intervention .  The patients' history has been reviewed, patient examined, no change in status, stable for surgery.  I have reviewed the patients' chart and labs.  Questions were answered to the patient's satisfaction.  I spoke with Dr. Myrtis Ser this am about his history, and reviewed his hospital course in detail earlier.  Discussed with patient.  He consents to proceed.     [name]

## 2011-05-25 NOTE — Op Note (Signed)
Cardiac Catheterization Procedure Note  Name: Omar Benson MRN: [id number] DOB: 02/01/1935  Procedure:  Placement of catheters for angio without left heart cath , Selective Coronary Angiography, SVG and LIMA angiography.  Indication: Patient with prior CABG and non STEMI.  See pre cath notes.     Procedural details: The right groin was prepped, draped, and anesthetized with 1% lidocaine. Using modified Seldinger technique, a 5 French sheath was introduced into the right femoral artery. Standard Judkins catheters were used for coronary angiography and left ventriculography. Catheter exchanges were performed over a guidewire. There were no immediate procedural complications. The patient was transferred to the post catheterization recovery area for further monitoring.  Procedural Findings: Hemodynamics:  AO 127/73 (95) LV not done   Coronary angiography: Coronary dominance: right  Left mainstem: The left main coronary artery is a large caliber vessel, and is free of significant disease.  Left anterior descending (LAD): The left anterior descending artery is totally occluded just after a septal perforator.  The left internal mammary artery to the left anterior descending artery could not be easily engaged. The subclavian has a left turn takeoff and is calcified at the ostium. We used a guiding catheter to inject the subclavian, and there was excellent flow throughout the internal mammary artery with a well-preserved insertion site to the LAD. There was nice flow through the graft into the apical LAD, which itself appeared to be mildly diseased but without focal obstruction.  Left circumflexLCx): The circumflex provides a large ramus intermedius vessel. This has mild ostial plaquing of about 50%.ppear be critical. The AV circumflex has a 70% stenosis in a small caliber vessel it bifurcates distally, there is probably some evidence of competitive filling of this vessel. The old  films are currently  not available.  Saphenous vein graft to the OM 2 is severely diseased at the distal end, there is extensive thrombus, and this leads into a smaller caliber distal vessel with poor runoff. This likely represents the infarct artery, given this appearance.   Right coronary artery (RCA): The right coronary artery is widely patent to the mid vessel, and this is followed by an 80% stenosis, and total occlusion.   The saphenous vein graft to a large intermediate branch appears to be widely patent. It is a large caliber vein, inserts into a fairly large caliber intermediate vessel which bifurcates distally.  The saphenous vein graft to the distal right circulation inserts into the PDA distally, and also the PLA.  The PLA portion has about 40% narrowing, but it is not significant.  The PDA portion is large, inserts distally and is slow in runoff, but not obstructed.   Left ventriculography: Not done secondary to reduced GFR.  Final Conclusions:   1.  Patent IMA to the LAD 2.  Patent SVG to the intermediate. 3.  Patent SVG to the PDA and PLA. 4.  Subtotally occluded, heavily thrombotic graft to smaller OM2. 5.  Native second ramus with 50% narrowing. 6.  No LV done to reduce contrast.   Recommendations:  1.  At present time, would lean toward medical therapy.  Will do 2D echo to assess LV function.   [name] 05/25/2011, 11:36 AM

## 2011-05-25 NOTE — Progress Notes (Signed)
ANTICOAGULATION CONSULT NOTE - Follow Up Consult  Pharmacy Consult for Heparin Indication: chest pain/ACS   Allergies  Allergen Reactions  . Doxycycline Hyclate     REACTION: unspecified  . Promethazine Hcl     Phenergan  . Tetanus Toxoid     REACTION: Rash  . Tetracycline     REACTION: Esophagitis    Patient Measurements: Height: 5\' 10"  (177.8 cm) Weight: 175 lb (79.379 kg) IBW/kg (Calculated) : 73  Heparin Dosing Weight: 79.5 kg  Vital Signs: Temp: 97.7 F (36.5 C) (01/06 2145) Temp src: Oral (01/06 1437) BP: 174/84 mmHg (01/06 2145) Pulse Rate: 60  (01/06 2145)  Labs:  Basename 05/25/11 0016 05/25/11 0015 05/24/11 2007 05/24/11 1520 05/24/11 0500 05/23/11 2004 05/23/11 1100  HGB 13.1 -- -- -- 12.9* -- --  HCT 39.5 -- -- -- 38.1* 41.0 --  PLT 162 -- -- -- 162 180 --  APTT -- -- -- -- -- 69* --  LABPROT -- -- -- -- -- 13.5 --  INR -- -- -- -- -- 1.01 --  HEPARINUNFRC 0.18* -- -- -- 0.31 0.37 --  CREATININE -- -- -- -- -- 1.57* 1.57*  CKTOTAL -- 665* 838* 1050* -- -- --  CKMB -- 49.8* 66.9* 90.2* -- -- --  TROPONINI -- 17.09* 19.83* 15.14* -- -- --   Estimated Creatinine Clearance: 42.6 ml/min (by C-G formula based on Cr of 1.57).  Scheduled:     . allopurinol  300 mg Oral Daily  . amiodarone  100 mg Oral Daily  . aspirin  324 mg Oral Pre-Cath  . aspirin EC  325 mg Oral Daily  . aspirin EC  81 mg Oral Daily  . clopidogrel  75 mg Oral Q breakfast  . diazepam  5 mg Oral On Call  . levothyroxine  100 mcg Oral Daily  . nitroGLYCERIN  1 inch Topical Q6H  . pantoprazole  40 mg Oral Q1200  . rosuvastatin  10 mg Oral Daily  . sodium chloride  3 mL Intravenous Q12H  . sodium chloride  3 mL Intravenous Q12H  . tiotropium  18 mcg Inhalation Daily    Assessment: 77yo male now with subtherapeutic heparin level after somewhat stable at low end of therapeutic.   Goal of Therapy:  Heparin level 0.3-0.7 units/ml   Plan:  Will increase heparin drip to 1000  units/hr and check level in 8hr vs f/u after cath.  Vernard Gambles, PharmD, BCPS  05/25/2011 2:14 AM

## 2011-05-26 ENCOUNTER — Other Ambulatory Visit: Payer: Self-pay

## 2011-05-26 DIAGNOSIS — I379 Nonrheumatic pulmonary valve disorder, unspecified: Secondary | ICD-10-CM

## 2011-05-26 DIAGNOSIS — I214 Non-ST elevation (NSTEMI) myocardial infarction: Principal | ICD-10-CM

## 2011-05-26 NOTE — Progress Notes (Signed)
Subjective:  No more pain.  Findings of cath noted and discussed with wife and patient.  No current chest pain.    Objective:  Vital Signs in the last 24 hours: Temp:  [98.3 F (36.8 C)-98.4 F (36.9 C)] 98.3 F (36.8 C) (01/08 1300) Pulse Rate:  [53-59] 59  (01/08 1300) Resp:  [16-20] 16  (01/08 1300) BP: (117-132)/(66-76) 128/70 mmHg (01/08 1300) SpO2:  [93 %-97 %] 97 % (01/08 1300)  Intake/Output from previous day: 01/07 0701 - 01/08 0700 In: 360 [P.O.:360] Out: -    Physical Exam: General: Well developed, well nourished, in no acute distress. Head:  Normocephalic and atraumatic. Lungs: Clear to auscultation and percussion. Heart: Normal S1 and S2.  No murmur, rubs or gallops.  Pulses: Groin site looks good.  No bruit or hematoma.   Lab Results:  Basename 05/25/11 0016 05/24/11 0500  WBC 5.9 6.0  HGB 13.1 12.9*  PLT 162 162    Basename 05/25/11 0015 05/23/11 2004  NA 139 139  K 4.4 4.5  CL 102 105  CO2 26 24  GLUCOSE 114* 126*  BUN 15 18  CREATININE 1.49* 1.57*    Basename 05/25/11 0015 05/24/11 2007  TROPONINI 17.09* 19.83*   Hepatic Function Panel  Basename 05/23/11 2004  PROT 7.4  ALBUMIN 3.8  AST 40*  ALT 17  ALKPHOS 108  BILITOT 0.5  BILIDIR --  IBILI --    Basename 05/24/11 0500  CHOL 174   No results found for this basename: PROTIME in the last 72 hours  Imaging: No results found.    Assessment/Plan:  Patient Active Hospital Problem List: HYPERLIPIDEMIA (09/19/2008)   Assessment: noted   Plan: continue meds Non-STEMI (non-ST elevated myocardial infarction) (05/24/2011)   Assessment: Stable status, probably secondary to graft occlusion   Plan: Ambulate today, home in am.  Recheck labs.        [name], MD, [facility], FSCAI 05/26/2011, 3:45 PM

## 2011-05-26 NOTE — Progress Notes (Signed)
  Echocardiogram 2D Echocardiogram has been performed.  Cathie Beams Deneen 05/26/2011, 10:26 AM

## 2011-05-26 NOTE — Progress Notes (Signed)
Pt ambulated from room to the desk; total of 800 feet x 2 today without problems. Pt abl eto tolerate well. Thanks ! Ancil Linsey RN

## 2011-05-27 ENCOUNTER — Encounter (HOSPITAL_COMMUNITY): Payer: Self-pay | Admitting: Nurse Practitioner

## 2011-05-27 LAB — CBC
HCT: 39.1 % (ref 39.0–52.0)
MCH: 31 pg (ref 26.0–34.0)
MCHC: 33.2 g/dL (ref 30.0–36.0)
MCV: 93.3 fL (ref 78.0–100.0)
Platelets: 163 10*3/uL (ref 150–400)
RDW: 13.6 % (ref 11.5–15.5)

## 2011-05-27 LAB — BASIC METABOLIC PANEL
BUN: 17 mg/dL (ref 6–23)
Calcium: 9.7 mg/dL (ref 8.4–10.5)
Chloride: 103 mEq/L (ref 96–112)
Creatinine, Ser: 1.68 mg/dL — ABNORMAL HIGH (ref 0.50–1.35)
GFR calc Af Amer: 45 mL/min — ABNORMAL LOW (ref >90)

## 2011-05-27 MED ORDER — PANTOPRAZOLE SODIUM 40 MG PO TBEC: 40.0000 mg | DELAYED_RELEASE_TABLET | Freq: Every day | ORAL | Status: DC

## 2011-05-27 MED ORDER — CLOPIDOGREL BISULFATE 75 MG PO TABS: 75.0000 mg | ORAL_TABLET | Freq: Every day | ORAL | Status: DC

## 2011-05-27 MED ORDER — ASPIRIN EC 81 MG PO TBEC: 81.0000 mg | DELAYED_RELEASE_TABLET | Freq: Every day | ORAL | Status: DC

## 2011-05-27 MED ORDER — NITROGLYCERIN 0.4 MG SL SUBL: 0.4000 mg | SUBLINGUAL_TABLET | SUBLINGUAL | Status: DC | PRN

## 2011-05-27 MED ORDER — ATORVASTATIN CALCIUM 80 MG PO TABS: 80.0000 mg | ORAL_TABLET | Freq: Every day | ORAL | Status: DC

## 2011-05-27 MED ORDER — ROSUVASTATIN CALCIUM 10 MG PO TABS: 10.0000 mg | ORAL_TABLET | Freq: Every day | ORAL | Status: DC

## 2011-05-27 NOTE — Progress Notes (Signed)
Subjective:  Stable at present.  No chest pain.  See rehab notes.    Objective:  Vital Signs in the last 24 hours: Temp:  [98.3 F (36.8 C)-98.7 F (37.1 C)] 98.7 F (37.1 C) (01/09 0500) Pulse Rate:  [57-59] 59  (01/09 0500) Resp:  [16-18] 18  (01/09 0500) BP: (128-148)/(65-70) 131/65 mmHg (01/09 0500) SpO2:  [96 %-97 %] 96 % (01/09 0500)  Intake/Output from previous day: 01/08 0701 - 01/09 0700 In: 600 [P.O.:600] Out: -    Physical Exam: General: Well developed, well nourished, in no acute distress. Head:  Normocephalic and atraumatic. Lungs: Clear to auscultation and percussion.  Slightly prolonged expiration.  Heart: Normal S1 and S2.  No murmur, rubs or gallops.  Neurologic: Alert and oriented x 3.    Lab Results:  Basename 05/27/11 0515 05/25/11 0016  WBC 5.6 5.9  HGB 13.0 13.1  PLT 163 162    Basename 05/27/11 0515 05/25/11 0015  NA 140 139  K 4.3 4.4  CL 103 102  CO2 27 26  GLUCOSE 92 114*  BUN 17 15  CREATININE 1.68* 1.49*    Basename 05/25/11 0015 05/24/11 2007  TROPONINI 17.09* 19.83*   Hepatic Function Panel No results found for this basename: PROT,ALBUMIN,AST,ALT,ALKPHOS,BILITOT,BILIDIR,IBILI in the last 72 hours No results found for this basename: CHOL in the last 72 hours No results found for this basename: PROTIME in the last 72 hours  Imaging: No results found.  EKG:  Telemetry reviewed.  HR remains low 50s  Cardiac Studies:  Echo shows normal LVEF.  Assessment/Plan:  Patient Active Hospital Problem List:  Non-STEMI (non-ST elevated myocardial infarction) (05/24/2011)   Assessment: MI probably due to graft occlusion;.   Plan: home today on DAPT.  Patient is not a candidate for beta blockers due to bradycardia.  On AMIO which has beta blocker activity, and he remains very slow with HR in 50s.  He took "med" before and HR went to 40s.  Therefore, withhold despite guidelines.  Crestor is new. No driving for two weeks. Continue follow  up with [facility]. SLight increase in Cr--home today with recheck in office under [name] on Friday.  PO fluids encouraged.    More than 30 minute total dc time.    [name] 9:43 AM 05/27/2011       [name], MD, [facility], FSCAI 05/27/2011, 9:38 AM

## 2011-05-27 NOTE — Progress Notes (Signed)
Pt ambulating independently without CP. Looks good. Ed completed and requests his name be sent to [facility] CRP[facility].  [phone]  Ethelda Chick CES, ACSM

## 2011-05-27 NOTE — Discharge Summary (Signed)
Patient ID: Omar Benson,  MRN: [id number], DOB/AGE: 1934/12/05 77 y.o.  Admit date: 05/23/2011 Discharge date: 05/27/2011  Primary Care Provider: Oliver Barre Primary Cardiologist: Lewayne Bunting  Discharge Diagnoses Principal Problem:  *Non-STEMI (non-ST elevated myocardial infarction) Active Problems:  HYPERLIPIDEMIA  GOUT  ANEMIA-IRON DEFICIENCY  HYPERTENSION  CAD  Atrial fibrillation  ATRIAL FLUTTER, CHRONIC  BRADYCARDIA  GERD  RENAL INSUFFICIENCY  Hypothyroid  COPD (chronic obstructive pulmonary disease)   Allergies Allergies  Allergen Reactions  . Doxycycline Hyclate     REACTION: unspecified  . Lipitor (Atorvastatin Calcium)     "nearly killed me"  . Promethazine Hcl     Phenergan  . Tetanus Toxoid     REACTION: Rash  . Tetracycline     REACTION: Esophagitis    Procedures  05/25/2011 Cardiac Catheterization Coronary angiography: Coronary dominance: right  Left mainstem: The left main coronary artery is a large caliber vessel, and is free of significant disease.  Left anterior descending (LAD): The left anterior descending artery is totally occluded just after a septal perforator.  The left internal mammary artery to the left anterior descending artery could not be easily engaged. The subclavian has a left turn takeoff and is calcified at the ostium. We used a guiding catheter to inject the subclavian, and there was excellent flow throughout the internal mammary artery with a well-preserved insertion site to the LAD. There was nice flow through the graft into the apical LAD, which itself appeared to be mildly diseased but without focal obstruction.  Left circumflexLCx): The circumflex provides a large ramus intermedius vessel. This has mild ostial plaquing of about 50%.ppear be critical. The AV circumflex has a 70% stenosis in a small caliber vessel it bifurcates distally, there is probably some evidence of competitive filling of this vessel. The old  films are  currently not available.  Saphenous vein graft to the OM 2 is severely diseased at the distal end, there is extensive thrombus, and this leads into a smaller caliber distal vessel with poor runoff. This likely represents the infarct artery, given this appearance.   Right coronary artery (RCA): The right coronary artery is widely patent to the mid vessel, and this is followed by an 80% stenosis, and total occlusion.   The saphenous vein graft to a large intermediate branch appears to be widely patent. It is a large caliber vein, inserts into a fairly large caliber intermediate vessel which bifurcates distally.  The saphenous vein graft to the distal right circulation inserts into the PDA distally, and also the PLA.  The PLA portion has about 40% narrowing, but it is not significant.  The PDA portion is large, inserts distally and is slow in runoff, but not obstructed.    **Medical Therapy Recommended** ______________________________________________________________  05/26/2011 2D Echocardiogram Study Conclusions  Left ventricle: Despite the recent MI, I can not see any definite wall motion aqbnormality. The cavity size was normal. Wall thickness was increased in a pattern of mild LVH. The estimated ejection fraction was 60%.  History of Present Illness  77 y/o male with the above problem list.  He was in his usoh until the AM of admission, when he was dressing after taking a shower and had sudden onset of bilateral shoulder discomfort with radiation to the right arm and neck.  He had no associated Ss.  He took 3 SL NTG w/o significant relief and presented to the [facility] ED for further eval.  In the ED he was treated with NTP, morphine, and  heparin with eventual relief of discomfort.  ECG was non-acute and initial CE were negative.  He was admitted for further eval.  Hospital Course   Pt r/i for MI eventually peaking his CK @ 1050, MB @ 90.2, and Trop I @ 19.83.  He had no further c/p and  underwent diagnostic catheterization on 1/7, revealing extensive thrombus in the distal end of the VG-OM2, and otherwise native CAD (known) and 4/5 patent grafts.  The VG-OM2 was felt to be the infarct vessel and decision was made to continue medical therapy.  Pt was maintained on asa, plavix, and statin therapy however beta blocker therapy was not initiated 2/2 h/o bradycardia and ongoing amiodarone therapy.  Pt has had no further c/p and f/u 2D echo on 1/8 has shown normal LV function.  He has been seen by cardiac rehab and has been ambulating w/o difficulty.  We plan to d/c home today in good condition.  Of note, Pts creatinine has risen to 1.68, up from 1.57 on admission.  We will f/u a bmet on Friday 1/11.  Discharge Vitals:  Blood pressure 131/65, pulse 59, temperature 98.7 F (37.1 C), temperature source Oral, resp. rate 18, height 5\' 10"  (1.778 m), weight 174 lb 6.1 oz (79.1 kg), SpO2 96.00%.   Labs: CBC:  Basename 05/27/11 0515 05/25/11 0016  WBC 5.6 5.9  NEUTROABS -- --  HGB 13.0 13.1  HCT 39.1 39.5  MCV 93.3 92.9  PLT 163 162   Basic Metabolic Panel:  Basename 05/27/11 0515 05/25/11 0015  NA 140 139  K 4.3 4.4  CL 103 102  CO2 27 26  GLUCOSE 92 114*  BUN 17 15  CREATININE 1.68* 1.49*  CALCIUM 9.7 9.4  MG -- --  PHOS -- --   Cardiac Enzymes:  Basename 05/25/11 0015 05/24/11 2007 05/24/11 1520  CKTOTAL 665* 838* 1050*  CKMB 49.8* 66.9* 90.2*  CKMBINDEX -- -- --  TROPONINI 17.09* 19.83* 15.14*   Lipid Panel     Component Value Date/Time   CHOL 174 05/24/2011 0500   TRIG 176* 05/24/2011 0500   HDL 43 05/24/2011 0500   CHOLHDL 4.0 05/24/2011 0500   VLDL 35 05/24/2011 0500   LDLCALC 96 05/24/2011 0500    Disposition:  Follow-up Information    Follow up with Oliver Barre, MD. (As needed)       Follow up with [facility] on 05/29/2011. (Blood Chemistry)       Follow up with Tereso Newcomer, PA on 06/09/2011. (2:30)    Contact information:   [address] [place] [address] [phone]          Discharge Medications: Current Discharge Medication List    START taking these medications   Details  clopidogrel (PLAVIX) 75 MG tablet Take 1 tablet (75 mg total) by mouth daily with breakfast. Qty: 30 tablet, Refills: 6    nitroGLYCERIN (NITROSTAT) 0.4 MG SL tablet Place 1 tablet (0.4 mg total) under the tongue every 5 (five) minutes as needed for chest pain. Qty: 25 tablet, Refills: 3    pantoprazole (PROTONIX) 40 MG tablet Take 1 tablet (40 mg total) by mouth daily at 12 noon. Qty: 30 tablet, Refills: 6    rosuvastatin (CRESTOR) 10 MG tablet Take 1 tablet (10 mg total) by mouth daily. Qty: 30 tablet, Refills: 6      CONTINUE these medications which have CHANGED   Details  aspirin EC 81 MG tablet Take 1 tablet (81 mg  total) by mouth daily. Qty: 30 tablet      CONTINUE these medications which have NOT CHANGED   Details  allopurinol (ZYLOPRIM) 300 MG tablet Take 300 mg by mouth daily.      amiodarone (PACERONE) 100 MG tablet Take 100 mg by mouth daily.      levothyroxine (SYNTHROID, LEVOTHROID) 100 MCG tablet Take 100 mcg by mouth daily.      tiotropium (SPIRIVA) 18 MCG inhalation capsule Place 18 mcg into inhaler and inhale daily.        STOP taking these medications     omeprazole (PRILOSEC) 20 MG capsule         Outstanding Labs/Studies  bmet on Friday 1/11 @ [facility]  Duration of Discharge Encounter: Greater than 30 minutes including physician time.  Signed, Nicolasa Ducking NP 05/27/2011, 2:05 PM

## 2011-05-29 ENCOUNTER — Other Ambulatory Visit (INDEPENDENT_AMBULATORY_CARE_PROVIDER_SITE_OTHER): Payer: Medicare Other | Admitting: *Deleted

## 2011-05-29 DIAGNOSIS — Z0000 Encounter for general adult medical examination without abnormal findings: Secondary | ICD-10-CM

## 2011-05-29 LAB — BASIC METABOLIC PANEL
CO2: 26 mEq/L (ref 19–32)
Calcium: 9.3 mg/dL (ref 8.4–10.5)
Creatinine, Ser: 1.8 mg/dL — ABNORMAL HIGH (ref 0.4–1.5)
Sodium: 139 mEq/L (ref 135–145)

## 2011-05-31 ENCOUNTER — Other Ambulatory Visit: Payer: Self-pay

## 2011-05-31 ENCOUNTER — Inpatient Hospital Stay (HOSPITAL_COMMUNITY)
Admission: EM | Admit: 2011-05-31 | Discharge: 2011-06-03 | DRG: 303 | Disposition: A | Discharge status: Home / Self Care | Payer: Medicare Other | Attending: Internal Medicine | Admitting: Internal Medicine

## 2011-05-31 ENCOUNTER — Encounter (HOSPITAL_COMMUNITY): Payer: Self-pay | Admitting: *Deleted

## 2011-05-31 ENCOUNTER — Emergency Department (HOSPITAL_COMMUNITY): Payer: Medicare Other

## 2011-05-31 DIAGNOSIS — J4489 Other specified chronic obstructive pulmonary disease: Secondary | ICD-10-CM | POA: Diagnosis present

## 2011-05-31 DIAGNOSIS — Z860100 Personal history of colon polyps, unspecified: Secondary | ICD-10-CM

## 2011-05-31 DIAGNOSIS — K219 Gastro-esophageal reflux disease without esophagitis: Secondary | ICD-10-CM | POA: Diagnosis present

## 2011-05-31 DIAGNOSIS — Z951 Presence of aortocoronary bypass graft: Secondary | ICD-10-CM

## 2011-05-31 DIAGNOSIS — Z87442 Personal history of urinary calculi: Secondary | ICD-10-CM

## 2011-05-31 DIAGNOSIS — E785 Hyperlipidemia, unspecified: Secondary | ICD-10-CM | POA: Diagnosis present

## 2011-05-31 DIAGNOSIS — E039 Hypothyroidism, unspecified: Secondary | ICD-10-CM | POA: Diagnosis present

## 2011-05-31 DIAGNOSIS — J9819 Other pulmonary collapse: Secondary | ICD-10-CM | POA: Diagnosis present

## 2011-05-31 DIAGNOSIS — Z87891 Personal history of nicotine dependence: Secondary | ICD-10-CM

## 2011-05-31 DIAGNOSIS — R079 Chest pain, unspecified: Secondary | ICD-10-CM

## 2011-05-31 DIAGNOSIS — Z888 Allergy status to other drugs, medicaments and biological substances status: Secondary | ICD-10-CM

## 2011-05-31 DIAGNOSIS — I714 Abdominal aortic aneurysm, without rupture, unspecified: Secondary | ICD-10-CM | POA: Diagnosis present

## 2011-05-31 DIAGNOSIS — J449 Chronic obstructive pulmonary disease, unspecified: Secondary | ICD-10-CM | POA: Diagnosis present

## 2011-05-31 DIAGNOSIS — Z881 Allergy status to other antibiotic agents status: Secondary | ICD-10-CM

## 2011-05-31 DIAGNOSIS — I498 Other specified cardiac arrhythmias: Secondary | ICD-10-CM | POA: Diagnosis present

## 2011-05-31 DIAGNOSIS — M109 Gout, unspecified: Secondary | ICD-10-CM | POA: Diagnosis present

## 2011-05-31 DIAGNOSIS — F411 Generalized anxiety disorder: Secondary | ICD-10-CM | POA: Diagnosis present

## 2011-05-31 DIAGNOSIS — I252 Old myocardial infarction: Secondary | ICD-10-CM

## 2011-05-31 DIAGNOSIS — I739 Peripheral vascular disease, unspecified: Secondary | ICD-10-CM | POA: Diagnosis present

## 2011-05-31 DIAGNOSIS — I4510 Unspecified right bundle-branch block: Secondary | ICD-10-CM | POA: Diagnosis present

## 2011-05-31 DIAGNOSIS — Z79899 Other long term (current) drug therapy: Secondary | ICD-10-CM

## 2011-05-31 DIAGNOSIS — Z7982 Long term (current) use of aspirin: Secondary | ICD-10-CM

## 2011-05-31 DIAGNOSIS — I200 Unstable angina: Secondary | ICD-10-CM | POA: Diagnosis present

## 2011-05-31 DIAGNOSIS — I4891 Unspecified atrial fibrillation: Secondary | ICD-10-CM | POA: Diagnosis present

## 2011-05-31 DIAGNOSIS — I4892 Unspecified atrial flutter: Secondary | ICD-10-CM | POA: Diagnosis present

## 2011-05-31 DIAGNOSIS — I10 Essential (primary) hypertension: Secondary | ICD-10-CM | POA: Diagnosis present

## 2011-05-31 DIAGNOSIS — I2510 Atherosclerotic heart disease of native coronary artery without angina pectoris: Principal | ICD-10-CM | POA: Diagnosis present

## 2011-05-31 LAB — COMPREHENSIVE METABOLIC PANEL
AST: 21 U/L (ref 0–37)
BUN: 18 mg/dL (ref 6–23)
CO2: 21 mEq/L (ref 19–32)
Calcium: 9.9 mg/dL (ref 8.4–10.5)
Chloride: 100 mEq/L (ref 96–112)
Creatinine, Ser: 1.57 mg/dL — ABNORMAL HIGH (ref 0.50–1.35)
GFR calc Af Amer: 48 mL/min — ABNORMAL LOW (ref >90)
GFR calc non Af Amer: 42 mL/min — ABNORMAL LOW (ref >90)
Total Bilirubin: 0.4 mg/dL (ref 0.3–1.2)

## 2011-05-31 LAB — CARDIAC PANEL(CRET KIN+CKTOT+MB+TROPI)
CK, MB: 1.8 ng/mL (ref 0.3–4.0)
Total CK: 63 U/L (ref 7–232)
Troponin I: 0.6 ng/mL (ref <0.30)

## 2011-05-31 LAB — CBC
HCT: 38.3 % — ABNORMAL LOW (ref 39.0–52.0)
HCT: 39.2 % (ref 39.0–52.0)
MCH: 32 pg (ref 26.0–34.0)
MCV: 91 fL (ref 78.0–100.0)
MCV: 92.2 fL (ref 78.0–100.0)
Platelets: 230 10*3/uL (ref 150–400)
RBC: 4.25 MIL/uL (ref 4.22–5.81)
RDW: 12.9 % (ref 11.5–15.5)
RDW: 13.1 % (ref 11.5–15.5)
WBC: 5.6 10*3/uL (ref 4.0–10.5)

## 2011-05-31 LAB — PROTIME-INR
INR: 1.12 (ref 0.00–1.49)
INR: 1.07 (ref 0.00–1.49)
Prothrombin Time: 14.6 seconds (ref 11.6–15.2)
Prothrombin Time: 14.1 seconds (ref 11.6–15.2)

## 2011-05-31 LAB — DIFFERENTIAL
Basophils Absolute: 0 10*3/uL (ref 0.0–0.1)
Basophils Relative: 1 % (ref 0–1)
Eosinophils Relative: 6 % — ABNORMAL HIGH (ref 0–5)
Monocytes Absolute: 0.6 10*3/uL (ref 0.1–1.0)
Monocytes Relative: 12 % (ref 3–12)

## 2011-05-31 LAB — BASIC METABOLIC PANEL
BUN: 20 mg/dL (ref 6–23)
Chloride: 100 mEq/L (ref 96–112)
Creatinine, Ser: 1.74 mg/dL — ABNORMAL HIGH (ref 0.50–1.35)
GFR calc Af Amer: 43 mL/min — ABNORMAL LOW (ref >90)
Glucose, Bld: 104 mg/dL — ABNORMAL HIGH (ref 70–99)

## 2011-05-31 LAB — APTT: aPTT: 34 seconds (ref 24–37)

## 2011-05-31 LAB — HEMOGLOBIN A1C
Hgb A1c MFr Bld: 5.4 % (ref <5.7)
Mean Plasma Glucose: 108 mg/dL (ref <117)

## 2011-05-31 LAB — HEPARIN LEVEL (UNFRACTIONATED): Heparin Unfractionated: 0.17 IU/mL — ABNORMAL LOW (ref 0.30–0.70)

## 2011-05-31 MED ORDER — ONDANSETRON HCL 4 MG/2ML IJ SOLN: 4.0000 mg | Freq: Four times a day (QID) | INTRAMUSCULAR | Status: DC | PRN

## 2011-05-31 MED ORDER — SODIUM CHLORIDE 0.9 % IJ SOLN
3.0000 mL | Freq: Two times a day (BID) | INTRAMUSCULAR | Status: DC
  Administered 2011-06-01 – 2011-06-02 (×4): 3 mL via INTRAVENOUS
  Administered 2011-06-03: 10:00:00 via INTRAVENOUS

## 2011-05-31 MED ORDER — ACETAMINOPHEN 325 MG PO TABS
650.0000 mg | ORAL_TABLET | ORAL | Status: DC | PRN
  Administered 2011-05-31 – 2011-06-02 (×4): 650 mg via ORAL
  Filled 2011-05-31 (×4): qty 2

## 2011-05-31 MED ORDER — HEPARIN (PORCINE) IN NACL 100-0.45 UNIT/ML-% IJ SOLN
12.0000 [IU]/kg/h | INTRAMUSCULAR | Status: DC
  Administered 2011-05-31: 12 [IU]/kg/h via INTRAVENOUS
  Filled 2011-05-31 (×2): qty 250

## 2011-05-31 MED ORDER — CLOPIDOGREL BISULFATE 75 MG PO TABS
75.0000 mg | ORAL_TABLET | Freq: Every day | ORAL | Status: DC
  Administered 2011-05-31 – 2011-06-03 (×4): 75 mg via ORAL
  Filled 2011-05-31 (×4): qty 1

## 2011-05-31 MED ORDER — ISOSORBIDE MONONITRATE ER 30 MG PO TB24
30.0000 mg | ORAL_TABLET | Freq: Every day | ORAL | Status: DC
  Administered 2011-05-31: 30 mg via ORAL
  Filled 2011-05-31 (×2): qty 1

## 2011-05-31 MED ORDER — HEPARIN (PORCINE) IN NACL 100-0.45 UNIT/ML-% IJ SOLN
1450.0000 [IU]/h | INTRAMUSCULAR | Status: DC
  Administered 2011-05-31 – 2011-06-01 (×3): 1450 [IU]/h via INTRAVENOUS
  Filled 2011-05-31 (×6): qty 250

## 2011-05-31 MED ORDER — PANTOPRAZOLE SODIUM 20 MG PO TBEC
20.0000 mg | DELAYED_RELEASE_TABLET | Freq: Every day | ORAL | Status: DC
  Administered 2011-05-31 – 2011-06-03 (×4): 20 mg via ORAL
  Filled 2011-05-31 (×4): qty 1

## 2011-05-31 MED ORDER — HEPARIN BOLUS VIA INFUSION
2400.0000 [IU] | Freq: Once | INTRAVENOUS | Status: AC
  Administered 2011-05-31: 2400 [IU] via INTRAVENOUS
  Filled 2011-05-31: qty 2400

## 2011-05-31 MED ORDER — NITROGLYCERIN 0.4 MG SL SUBL: 0.4000 mg | SUBLINGUAL_TABLET | SUBLINGUAL | Status: DC | PRN

## 2011-05-31 MED ORDER — LEVOTHYROXINE SODIUM 100 MCG PO TABS
100.0000 ug | ORAL_TABLET | Freq: Every day | ORAL | Status: DC
  Administered 2011-06-01 – 2011-06-03 (×3): 100 ug via ORAL
  Filled 2011-05-31 (×4): qty 1

## 2011-05-31 MED ORDER — TIOTROPIUM BROMIDE MONOHYDRATE 18 MCG IN CAPS
18.0000 ug | ORAL_CAPSULE | Freq: Every day | RESPIRATORY_TRACT | Status: DC
  Administered 2011-05-31 – 2011-06-03 (×4): 18 ug via RESPIRATORY_TRACT
  Filled 2011-05-31: qty 5

## 2011-05-31 MED ORDER — ASPIRIN EC 81 MG PO TBEC
81.0000 mg | DELAYED_RELEASE_TABLET | Freq: Every day | ORAL | Status: DC
  Administered 2011-05-31 – 2011-06-03 (×4): 81 mg via ORAL
  Filled 2011-05-31 (×4): qty 1

## 2011-05-31 MED ORDER — ASPIRIN EC 81 MG PO TBEC: 81.0000 mg | DELAYED_RELEASE_TABLET | Freq: Every day | ORAL | Status: DC

## 2011-05-31 MED ORDER — HEPARIN BOLUS VIA INFUSION
4000.0000 [IU] | Freq: Once | INTRAVENOUS | Status: AC
  Administered 2011-05-31: 4000 [IU] via INTRAVENOUS
  Filled 2011-05-31: qty 4000

## 2011-05-31 MED ORDER — AMIODARONE HCL 100 MG PO TABS
100.0000 mg | ORAL_TABLET | Freq: Every day | ORAL | Status: DC
  Administered 2011-05-31 – 2011-06-03 (×4): 100 mg via ORAL
  Filled 2011-05-31 (×4): qty 1

## 2011-05-31 MED ORDER — SODIUM CHLORIDE 0.9 % IJ SOLN: 3.0000 mL | INTRAMUSCULAR | Status: DC | PRN

## 2011-05-31 MED ORDER — SODIUM CHLORIDE 0.9 % IV SOLN: 250.0000 mL | INTRAVENOUS | Status: DC | PRN

## 2011-05-31 MED ORDER — ROSUVASTATIN CALCIUM 10 MG PO TABS
10.0000 mg | ORAL_TABLET | Freq: Every day | ORAL | Status: DC
  Administered 2011-05-31 – 2011-06-02 (×3): 10 mg via ORAL
  Filled 2011-05-31 (×4): qty 1

## 2011-05-31 NOTE — ED Notes (Signed)
Heparin bolus verified by Inetta Fermo, RN.

## 2011-05-31 NOTE — ED Provider Notes (Addendum)
History     CSN: [id number]  Arrival date & time 05/31/11  5621   First MD Initiated Contact with Patient 05/31/11 0357      Chief Complaint  Patient presents with  . Chest Pain    (Consider location/radiation/quality/duration/timing/severity/associated sxs/prior treatment) HPI Comments: 77 year old male with a history of myocardial infarction, known coronary artery disease status post 5 vessel CABG done in the past, presents one week after having a non-ST elevation myocardial infarction.  He states that the pain today is in his middle chest, described as a dull ache, persistent over the last several hours. Nothing makes this better or worse, no associated nausea, vomiting, shortness of breath, cough, swelling, diaphoresis. Symptoms are moderate, is somewhat similar to prior heart attack. He does have known osteoarthritis of the cervical spine and symptoms of back pain similar to the back pain that he has today. During his admission to the hospital he was treated with aspirin Plavix and medical therapy after a coronary cath showed that he had an occlusion of one of the saphenous vein grafts. This was a not amenable to intervention.  Patient is a 77 y.o. male presenting with chest pain. The history is provided by a relative, the patient and medical records.  Chest Pain     Past Medical History  Diagnosis Date  . Unspecified disorder of thyroid 12/05/2008  . HYPERLIPIDEMIA 09/19/2008  . GOUT 09/19/2008  . ANEMIA-IRON DEFICIENCY 09/19/2008  . HYPERTENSION 09/19/2008  . CAD 12/05/2008  . Atrial fibrillation 04/19/2008  . ATRIAL FLUTTER, CHRONIC 12/05/2008  . BRADYCARDIA 12/05/2008  . AAA 09/19/2008  . PERIPHERAL VASCULAR DISEASE 09/19/2008  . GERD 09/19/2008  . DIVERTICULOSIS, COLON 09/19/2008  . RENAL INSUFFICIENCY 09/19/2008  . LOW BACK PAIN 09/19/2008  . BACK PAIN 03/03/2010  . Other chest pain 04/06/2008  . CHEST PAIN 03/03/2010  . EPIGASTRIC PAIN 05/03/2008  . FLANK PAIN, LEFT 01/11/2009  . PSA,  INCREASED 09/19/2008  . COLONIC POLYPS, HX OF 09/19/2008  . GASTROINTESTINAL HEMORRHAGE, HX OF 04/19/2008  . NEPHROLITHIASIS, HX OF 01/11/2009  . CORONARY ARTERY BYPASS GRAFT, HX OF 12/05/2008    A. LIMA-LAD, VG-RI, VG-OM2, VG-RPD/RPL;  B. 05/2011 - NSTEMI - CATH WITH 4/5 PATENT GRAFTS AND NEW THROMBUS IN DISTAL VG-OM2 - MED RX  . ANXIETY 06/13/2010  . Postnasal drip 07/15/2010  . GASTRITIS 07/21/2010  . Shortness of breath   . Hypothyroidism   . Blood transfusion   . Headache   . Arthritis   . COPD (chronic obstructive pulmonary disease)     Past Surgical History  Procedure Date  . Aorto-femoral bypass x5 - stress test neg 9/08   . Coronary artery bypass graft   . S/p afib ablation   . Cardiac catheterization     Family History  Problem Relation Age of Onset  . Diabetes Father   . Diabetes Sister   . Multiple myeloma Mother   . Heart disease Paternal Uncle   . Heart disease Maternal Uncle   . Lymphoma Sister     half-sister    History  Substance Use Topics  . Smoking status: Former Smoker -- 4.0 packs/day for 31 years    Quit date: 03/19/1983  . Smokeless tobacco: Not on file  . Alcohol Use: Yes     2      Review of Systems  Cardiovascular: Positive for chest pain.  All other systems reviewed and are negative.    Allergies  Doxycycline hyclate; Lipitor; Promethazine hcl; Tetanus toxoid; and  Tetracycline  Home Medications   Current Outpatient Rx  Name Route Sig Dispense Refill  . ACETAMINOPHEN 500 MG PO TABS Oral Take 500 mg by mouth every 8 (eight) hours as needed. For pain    . ALLOPURINOL 300 MG PO TABS Oral Take 300 mg by mouth daily.      . AMIODARONE HCL 100 MG PO TABS Oral Take 100 mg by mouth daily.      . ASPIRIN EC 81 MG PO TBEC Oral Take 81 mg by mouth daily.    Marland Kitchen CLOPIDOGREL BISULFATE 75 MG PO TABS Oral Take 75 mg by mouth daily.    Marland Kitchen LEVOTHYROXINE SODIUM 100 MCG PO TABS Oral Take 100 mcg by mouth daily.      Marland Kitchen PANTOPRAZOLE SODIUM 20 MG PO TBEC Oral  Take 20 mg by mouth daily.    Marland Kitchen ROSUVASTATIN CALCIUM 10 MG PO TABS Oral Take 10 mg by mouth daily.    Marland Kitchen TIOTROPIUM BROMIDE MONOHYDRATE 18 MCG IN CAPS Inhalation Place 18 mcg into inhaler and inhale daily.        BP 116/75  Pulse 63  Temp(Src) 97.5 F (36.4 C) (Oral)  Resp 19  SpO2 95%  Physical Exam  Nursing note and vitals reviewed. Constitutional: He appears well-developed and well-nourished. No distress.  HENT:  Head: Normocephalic and atraumatic.  Mouth/Throat: Oropharynx is clear and moist. No oropharyngeal exudate.  Eyes: Conjunctivae and EOM are normal. Pupils are equal, round, and reactive to light. Right eye exhibits no discharge. Left eye exhibits no discharge. No scleral icterus.  Neck: Normal range of motion. Neck supple. No JVD present. No thyromegaly present.  Cardiovascular: Normal rate, regular rhythm, normal heart sounds and intact distal pulses.  Exam reveals no gallop and no friction rub.   No murmur heard. Pulmonary/Chest: Effort normal and breath sounds normal. No respiratory distress. He has no wheezes. He has no rales.  Abdominal: Soft. Bowel sounds are normal. He exhibits no distension and no mass. There is no tenderness.  Musculoskeletal: Normal range of motion. He exhibits no edema and no tenderness.  Lymphadenopathy:    He has no cervical adenopathy.  Neurological: He is alert. Coordination normal.  Skin: Skin is warm and dry. No rash noted. No erythema.  Psychiatric: He has a normal mood and affect. His behavior is normal.    ED Course  Procedures (including critical care time)  Labs Reviewed  CBC - Abnormal; Notable for the following:    RBC 4.21 (*)    HCT 38.3 (*)    All other components within normal limits  BASIC METABOLIC PANEL - Abnormal; Notable for the following:    Sodium 134 (*)    Glucose, Bld 104 (*)    Creatinine, Ser 1.74 (*)    GFR calc non Af Amer 37 (*)    GFR calc Af Amer 43 (*)    All other components within normal limits    TROPONIN I - Abnormal; Notable for the following:    Troponin I 1.02 (*)    All other components within normal limits  CK TOTAL AND CKMB   Dg Chest Portable 1 View  05/31/2011  *RADIOLOGY REPORT*  Clinical Data: Chest pain.  PORTABLE CHEST - 1 VIEW  Comparison: Chest radiograph performed 05/23/2011  Findings: The lungs are well-aerated.  Mild bibasilar atelectasis is noted.  There is no evidence of pleural effusion or pneumothorax.  The cardiomediastinal silhouette is normal in size; the patient is status post median sternotomy, with  evidence of prior CABG.  No acute osseous abnormalities are seen.  IMPRESSION: Mild bibasilar atelectasis noted; lungs otherwise clear.  Original Report Authenticated By: Tonia Ghent, M.D.     1. Unstable angina       MDM  Exam is benign, EKG is nonischemic and vital signs are normal at this time. We'll proceed with laboratory evaluation, chest x-ray and repeat cardiology consultation.  ED ECG REPORT   Date: 05/31/2011   Rate: 64  Rhythm: normal sinus rhythm  QRS Axis: left  Intervals: normal  ST/T Wave abnormalities: normal  Conduction Disutrbances:none  Narrative Interpretation:   Old EKG Reviewed: unchanged from 05/26/11  Patient's vital signs have been unremarkable, his pain has improved slightly, aspirin ordered, chest x-ray unremarkable and lab work remarkable only for a creatinine of 1.74 which is stable from prior measurements, a troponin of 1.02 which appears to be elevated however the patient's troponin from January 6 was 20. This may just be the result of the length and half life of the troponin.  I discussed care with the cardiologist on call, he agrees with heparinization and admission to the hospital. He will come to admit. Critical care given do to coronary obstructive disease with elevated troponin on a heparin drip.    CRITICAL CARE Performed by: Vida Roller   Total critical care time: 35  Critical care time was exclusive of  separately billable procedures and treating other patients.  Critical care was necessary to treat or prevent imminent or life-threatening deterioration.  Critical care was time spent personally by me on the following activities: development of treatment plan with patient and/or surrogate as well as nursing, discussions with consultants, evaluation of patient's response to treatment, examination of patient, obtaining history from patient or surrogate, ordering and performing treatments and interventions, ordering and review of laboratory studies, ordering and review of radiographic studies, pulse oximetry and re-evaluation of patient's condition.    Vida Roller, MD 05/31/11 1324  Vida Roller, MD 05/31/11 [phone]

## 2011-05-31 NOTE — ED Notes (Signed)
Received bedside report from [place], [place].  Patient moved from PDA 09 to [facility] 18.  Patient currently sitting up in bed; no respiratory or acute distress noted.  Cardiologist (admitting MD) currently at bedside; will continue to monitor.

## 2011-05-31 NOTE — ED Notes (Signed)
EKG completed at 0344.

## 2011-05-31 NOTE — ED Notes (Signed)
The pt has had chest pain anterior and posterior since last pm  With some sob and weakness today. He says he had a mi last saturday

## 2011-05-31 NOTE — ED Notes (Signed)
Patient currently sitting up in bed; no respiratory or acute distress noted.  Patient updated on plan of care; informed patient that admitting MD is currently writing admission orders.  Patient and family member given warm blankets; patient has no questions or concerns at this time.  Will continue to monitor.

## 2011-05-31 NOTE — ED Notes (Signed)
Cardiologist at bedside.  

## 2011-05-31 NOTE — ED Notes (Signed)
Patient given meal tray.

## 2011-05-31 NOTE — ED Notes (Signed)
EKG given to Dr. Hyacinth Meeker along with OLD ekg.

## 2011-05-31 NOTE — Progress Notes (Signed)
ANTICOAGULATION CONSULT NOTE - Follow Up Consult  Pharmacy Consult for Heparin Indication: chest pain/ACS  Allergies  Allergen Reactions  . Doxycycline Hyclate     REACTION: unspecified  . Lipitor (Atorvastatin Calcium)     "nearly killed me"  . Promethazine Hcl     Phenergan  . Tetanus Toxoid     REACTION: Rash  . Tetracycline     REACTION: Esophagitis    Patient Measurements: Height: 5\' 10"  (177.8 cm) Weight: 175 lb 11.3 oz (79.7 kg) IBW/kg (Calculated) : 73  Adjusted Body Weight:   Vital Signs: Temp: 98.3 F (36.8 C) (01/13 1215) Temp src: Oral (01/13 1215) BP: 143/71 mmHg (01/13 1215) Pulse Rate: 60  (01/13 1215)  Labs:  Alvira Philips 05/31/11 0513 05/31/11 0405 05/31/11 0403 05/29/11 0917  HGB -- -- 13.4 --  HCT -- -- 38.3* --  PLT -- -- 217 --  APTT 34 -- -- --  LABPROT 14.6 -- -- --  INR 1.12 -- -- --  HEPARINUNFRC -- -- -- --  CREATININE -- -- 1.74* 1.8*  CKTOTAL -- 81 -- --  CKMB -- 2.0 -- --  TROPONINI -- 1.02* -- --   Estimated Creatinine Clearance: 38.5 ml/min (by C-G formula based on Cr of 1.74).  Assessment: 76 yoM on Heparin for chest pain. Heparin level subtherapeutic @ 0.13.  Hgb stable 13.4-->13.6.    Goal of Therapy:  Heparin level 0.3-0.7 units/ml(heparin dosing weight =79.7kg)   Plan:  1. Increase heparin rate to 1150 units/hr = 11.5 ml/hr.  2. F/u 6 hour HL @ 2030  Omesha Bowerman E 05/31/2011,1:13 PM

## 2011-05-31 NOTE — Progress Notes (Signed)
ANTICOAGULATION CONSULT NOTE - Initial Consult  Pharmacy Consult for Heparin Indication: ACS  Allergies  Allergen Reactions  . Doxycycline Hyclate     REACTION: unspecified  . Lipitor (Atorvastatin Calcium)     "nearly killed me"  . Promethazine Hcl     Phenergan  . Tetanus Toxoid     REACTION: Rash  . Tetracycline     REACTION: Esophagitis    Patient Measurements: Height: 5\' 10"  (177.8 cm) Weight: 177 lb (80.287 kg) IBW/kg (Calculated) : 73   Vital Signs: Temp: 97.5 F (36.4 C) (01/13 0349) Temp src: Oral (01/13 0349) BP: 112/66 mmHg (01/13 0630) Pulse Rate: 51  (01/13 0630)  Labs:  Omar Benson 05/31/11 0513 05/31/11 0405 05/31/11 0403 05/29/11 0917  HGB -- -- 13.4 --  HCT -- -- 38.3* --  PLT -- -- 217 --  APTT 34 -- -- --  LABPROT 14.6 -- -- --  INR 1.12 -- -- --  HEPARINUNFRC -- -- -- --  CREATININE -- -- 1.74* 1.8*  CKTOTAL -- 81 -- --  CKMB -- 2.0 -- --  TROPONINI -- 1.02* -- --   Estimated Creatinine Clearance: 38.5 ml/min (by C-G formula based on Cr of 1.74).  Medical History: Past Medical History  Diagnosis Date  . Unspecified disorder of thyroid 12/05/2008  . HYPERLIPIDEMIA 09/19/2008  . GOUT 09/19/2008  . ANEMIA-IRON DEFICIENCY 09/19/2008  . HYPERTENSION 09/19/2008  . CAD 12/05/2008  . Atrial fibrillation 04/19/2008  . ATRIAL FLUTTER, CHRONIC 12/05/2008  . BRADYCARDIA 12/05/2008  . AAA 09/19/2008  . PERIPHERAL VASCULAR DISEASE 09/19/2008  . GERD 09/19/2008  . DIVERTICULOSIS, COLON 09/19/2008  . RENAL INSUFFICIENCY 09/19/2008  . LOW BACK PAIN 09/19/2008  . BACK PAIN 03/03/2010  . Other chest pain 04/06/2008  . CHEST PAIN 03/03/2010  . EPIGASTRIC PAIN 05/03/2008  . FLANK PAIN, LEFT 01/11/2009  . PSA, INCREASED 09/19/2008  . COLONIC POLYPS, HX OF 09/19/2008  . GASTROINTESTINAL HEMORRHAGE, HX OF 04/19/2008  . NEPHROLITHIASIS, HX OF 01/11/2009  . CORONARY ARTERY BYPASS GRAFT, HX OF 12/05/2008    A. LIMA-LAD, VG-RI, VG-OM2, VG-RPD/RPL;  B. 05/2011 - NSTEMI - CATH WITH 4/5  PATENT GRAFTS AND NEW THROMBUS IN DISTAL VG-OM2 - MED RX  . ANXIETY 06/13/2010  . Postnasal drip 07/15/2010  . GASTRITIS 07/21/2010  . Shortness of breath   . Hypothyroidism   . Blood transfusion   . Headache   . Arthritis   . COPD (chronic obstructive pulmonary disease)     Medications:  APAP  ASA  Plavix  Allopurinol  Amiodarone  Synthroid  Protonix  Crestor  Spiriva  Assessment: 77 yo male with chest pain for Heparin.  Heparin 4000 units IV bolus, 900 units/hr started in ED at  0530   Goal of Therapy:  Heparin level 0.3-0.7 units/ml   Plan:  Continue Heparin at current rate Check heparin level in 6 hours.  Omar Benson 05/31/2011,6:49 AM

## 2011-05-31 NOTE — Progress Notes (Signed)
ANTICOAGULATION CONSULT NOTE - Follow Up Consult  Pharmacy Consult for Heparin Indication: chest pain/ACS  Patient Measurements: Height: 5\' 10"  (177.8 cm) Weight: 175 lb 11.3 oz (79.7 kg) IBW/kg (Calculated) : 73  Heparin dosing Weight: 79.7 kg  Labs:  Basename 05/31/11 1942 05/31/11 1811 05/31/11 1313 05/31/11 0513 05/31/11 0405 05/31/11 0403 05/29/11 0917  HGB -- -- 13.6 -- -- 13.4 --  HCT -- -- 39.2 -- -- 38.3* --  PLT -- -- 230 -- -- 217 --  APTT -- -- 46* 34 -- -- --  LABPROT -- -- 14.1 14.6 -- -- --  INR -- -- 1.07 1.12 -- -- --  HEPARINUNFRC 0.17* -- 0.13* -- -- -- --  CREATININE -- -- 1.57* -- -- 1.74* 1.8*  CKTOTAL -- 63 72 -- 81 -- --  CKMB -- 1.8 1.8 -- 2.0 -- --  TROPONINI -- 0.60* 0.82* -- 1.02* -- --   Assessment: 77 yo M on heparin for chest pain. Heparin level is subtherapeutic. No bleeding noted.  Goal of Therapy:  Heparin level 0.3-0.7 units/ml   Plan:  1. Heparin 2400 units IV bolus then increase rate to 1450 units/hr (14.5 ml/hr) 2. Heparin level 8 hrs after rate change.  Loura Back Danielle 05/31/2011,9:24 PM

## 2011-05-31 NOTE — H&P (Signed)
Omar Benson is an 77 y.o. male.    Chief Complaint: Back pain  HPI: 77 y/o male with a PMH of CAD s/p CABG x 4 (LIMA->LAD, SVG->OM2, SVG->intermediate, SVG->PDA and PLA). He was recently admitted for NSTEMI (presented with neck pain) and underwent cardiac cath 05/25/2011 as shown below. He was found to have a thrombus in his SVG-OM2 (infarct related artery) that was treated medically with Aspirin and Plavix.  A beta-blocker was not started since he was already bradycardic.  His total CK, CKMB and troponin at the time of his last presentation were 1050, 90 and 19.8 respectively.  Patient has been doing fine since discharge until 05/30/2011 at about 9 pm when he developed back pain in the posterior aspect of his mid-clavicular area.  He wasn't sure if this was his angina equivalent or his degenerative joint disease pain.  He took 2 SL NTG with no relief and decided to come to ER since his pain was persisted.  In the ER, his ECG showed sinus rhythm with incomplete right bundle branch block which is no different from his prior ECG from 05/26/2011.  His first set of cardiac marker showed total CK of 81, CKMB of 2.0 and troponin I of 1.02.  He is currently chest pain free. He feels like this pain is slightly different from the pain he had the last time he was admitted (he had neck pain then).  He denies nausea, vomiting, diaphoresis, PND orthopnea, palpitation or syncope.     Cardiac cath 05/25/2011  Coronary angiography:  Coronary dominance: right  Left mainstem: The left main coronary artery is a large caliber vessel, and is free of significant disease.  Left anterior descending (LAD): The left anterior descending artery is totally occluded just after a septal perforator.  The left internal mammary artery to the left anterior descending artery could not be easily engaged. The subclavian has a left turn takeoff and is calcified at the ostium. We used a guiding catheter to inject the subclavian, and there was  excellent flow throughout the internal mammary artery with a well-preserved insertion site to the LAD. There was nice flow through the graft into the apical LAD, which itself appeared to be mildly diseased but without focal obstruction.  Left circumflexLCx): The circumflex provides a large ramus intermedius vessel. This has mild ostial plaquing of about 50%.ppear be critical. The AV circumflex has a 70% stenosis in a small caliber vessel it bifurcates distally, there is probably some evidence of competitive filling of this vessel. The old films are currently not available.  Saphenous vein graft to the OM 2 is severely diseased at the distal end, there is extensive thrombus, and this leads into a smaller caliber distal vessel with poor runoff. This likely represents the infarct artery, given this appearance.  Right coronary artery (RCA): The right coronary artery is widely patent to the mid vessel, and this is followed by an 80% stenosis, and total occlusion.  The saphenous vein graft to a large intermediate branch appears to be widely patent. It is a large caliber vein, inserts into a fairly large caliber intermediate vessel which bifurcates distally.  The saphenous vein graft to the distal right circulation inserts into the PDA distally, and also the PLA. The PLA portion has about 40% narrowing, but it is not significant. The PDA portion is large, inserts distally and is slow in runoff, but not obstructed.  Left ventriculography: Not done secondary to reduced GFR.  Final Conclusions:  1. Patent  IMA to the LAD  2. Patent SVG to the intermediate.  3. Patent SVG to the PDA and PLA.  4. Subtotally occluded, heavily thrombotic graft to smaller OM2.  5. Native second ramus with 50% narrowing.  6. No LV done to reduce contrast.  Recommendations:  1. At present time, would lean toward medical therapy. Will do 2D echo to assess LV function.  Omar Benson  05/25/2011,   Past Medical History  Diagnosis  Date  . Unspecified disorder of thyroid 12/05/2008  . HYPERLIPIDEMIA 09/19/2008  . GOUT 09/19/2008  . ANEMIA-IRON DEFICIENCY 09/19/2008  . HYPERTENSION 09/19/2008  . CAD 12/05/2008  . Atrial fibrillation 04/19/2008  . ATRIAL FLUTTER, CHRONIC 12/05/2008  . BRADYCARDIA 12/05/2008  . AAA 09/19/2008  . PERIPHERAL VASCULAR DISEASE 09/19/2008  . GERD 09/19/2008  . DIVERTICULOSIS, COLON 09/19/2008  . RENAL INSUFFICIENCY 09/19/2008  . LOW BACK PAIN 09/19/2008  . BACK PAIN 03/03/2010  . Other chest pain 04/06/2008  . CHEST PAIN 03/03/2010  . EPIGASTRIC PAIN 05/03/2008  . FLANK PAIN, LEFT 01/11/2009  . PSA, INCREASED 09/19/2008  . COLONIC POLYPS, HX OF 09/19/2008  . GASTROINTESTINAL HEMORRHAGE, HX OF 04/19/2008  . NEPHROLITHIASIS, HX OF 01/11/2009  . CORONARY ARTERY BYPASS GRAFT, HX OF 12/05/2008    A. LIMA-LAD, VG-RI, VG-OM2, VG-RPD/RPL;  B. 05/2011 - NSTEMI - CATH WITH 4/5 PATENT GRAFTS AND NEW THROMBUS IN DISTAL VG-OM2 - MED RX  . ANXIETY 06/13/2010  . Postnasal drip 07/15/2010  . GASTRITIS 07/21/2010  . Shortness of breath   . Hypothyroidism   . Blood transfusion   . Headache   . Arthritis   . COPD (chronic obstructive pulmonary disease)     Past Surgical History  Procedure Date  . Aorto-femoral bypass x5 - stress test neg 9/08   . Coronary artery bypass graft   . S/p afib ablation   . Cardiac catheterization     Family History  Problem Relation Age of Onset  . Diabetes Father   . Diabetes Sister   . Multiple myeloma Mother   . Heart disease Paternal Uncle   . Heart disease Maternal Uncle   . Lymphoma Sister     half-sister   Social History:  reports that he quit smoking about 28 years ago. He does not have any smokeless tobacco history on file. He reports that he drinks alcohol. He reports that he does not use illicit drugs.  Allergies:  Allergies  Allergen Reactions  . Doxycycline Hyclate     REACTION: unspecified  . Lipitor (Atorvastatin Calcium)     "nearly killed me"  . Promethazine Hcl      Phenergan  . Tetanus Toxoid     REACTION: Rash  . Tetracycline     REACTION: Esophagitis    Medications Prior to Admission  Medication Dose Route Frequency Provider Last Rate Last Dose  . heparin ADULT infusion 100 units/mL (25000 units/250 mL)  12 Units/kg/hr (Adjusted) Intravenous Continuous Vida Roller, MD 9 mL/hr at 05/31/11 0529 12 Units/kg/hr at 05/31/11 0529  . heparin bolus via infusion 4,000 Units  4,000 Units Intravenous Once Vida Roller, MD   4,000 Units at 05/31/11 0530   Medications Prior to Admission  Medication Sig Dispense Refill  . allopurinol (ZYLOPRIM) 300 MG tablet Take 300 mg by mouth daily.        Marland Kitchen amiodarone (PACERONE) 100 MG tablet Take 100 mg by mouth daily.        Marland Kitchen levothyroxine (SYNTHROID, LEVOTHROID) 100 MCG tablet Take  100 mcg by mouth daily.        Marland Kitchen tiotropium (SPIRIVA) 18 MCG inhalation capsule Place 18 mcg into inhaler and inhale daily.          Results for orders placed during the hospital encounter of 05/31/11 (from the past 48 hour(s))  CBC     Status: Abnormal   Collection Time   05/31/11  4:03 AM      Component Value Range Comment   WBC 5.6  4.0 - 10.5 (K/uL)    RBC 4.21 (*) 4.22 - 5.81 (MIL/uL)    Hemoglobin 13.4  13.0 - 17.0 (g/dL)    HCT 16.1 (*) 09.6 - 52.0 (%)    MCV 91.0  78.0 - 100.0 (fL)    MCH 31.8  26.0 - 34.0 (pg)    MCHC 35.0  30.0 - 36.0 (g/dL)    RDW 04.5  40.9 - 81.1 (%)    Platelets 217  150 - 400 (K/uL)   BASIC METABOLIC PANEL     Status: Abnormal   Collection Time   05/31/11  4:03 AM      Component Value Range Comment   Sodium 134 (*) 135 - 145 (mEq/L)    Potassium 4.2  3.5 - 5.1 (mEq/L)    Chloride 100  96 - 112 (mEq/L)    CO2 23  19 - 32 (mEq/L)    Glucose, Bld 104 (*) 70 - 99 (mg/dL)    BUN 20  6 - 23 (mg/dL)    Creatinine, Ser 9.14 (*) 0.50 - 1.35 (mg/dL)    Calcium 9.3  8.4 - 10.5 (mg/dL)    GFR calc non Af Amer 37 (*) >90 (mL/min)    GFR calc Af Amer 43 (*) >90 (mL/min)   TROPONIN I     Status:  Abnormal   Collection Time   05/31/11  4:05 AM      Component Value Range Comment   Troponin I 1.02 (*) <0.30 (ng/mL)   CK TOTAL AND CKMB     Status: Normal   Collection Time   05/31/11  4:05 AM      Component Value Range Comment   Total CK 81  7 - 232 (U/L)    CK, MB 2.0  0.3 - 4.0 (ng/mL)    Relative Index RELATIVE INDEX IS INVALID  0.0 - 2.5    APTT     Status: Normal   Collection Time   05/31/11  5:13 AM      Component Value Range Comment   aPTT 34  24 - 37 (seconds)   PROTIME-INR     Status: Normal   Collection Time   05/31/11  5:13 AM      Component Value Range Comment   Prothrombin Time 14.6  11.6 - 15.2 (seconds)    INR 1.12  0.00 - 1.49     Dg Chest Portable 1 View  05/31/2011  *RADIOLOGY REPORT*  Clinical Data: Chest pain.  PORTABLE CHEST - 1 VIEW  Comparison: Chest radiograph performed 05/23/2011  Findings: The lungs are well-aerated.  Mild bibasilar atelectasis is noted.  There is no evidence of pleural effusion or pneumothorax.  The cardiomediastinal silhouette is normal in size; the patient is status post median sternotomy, with evidence of prior CABG.  No acute osseous abnormalities are seen.  IMPRESSION: Mild bibasilar atelectasis noted; lungs otherwise clear.  Original Report Authenticated By: Tonia Ghent, M.D.    Review of Systems  Constitutional: Negative.  Negative for fever, chills,  weight loss, malaise/fatigue and diaphoresis.  HENT: Negative.  Negative for hearing loss, ear pain, nosebleeds, congestion, neck pain, tinnitus and ear discharge.   Eyes: Negative.  Negative for blurred vision, double vision, photophobia, pain and discharge.  Respiratory: Negative.  Negative for cough, hemoptysis, sputum production and shortness of breath.   Cardiovascular: Positive for chest pain. Negative for palpitations, orthopnea and claudication.  Gastrointestinal: Negative.  Negative for heartburn, nausea, vomiting, abdominal pain, diarrhea and constipation.  Genitourinary:  Negative.  Negative for dysuria, urgency and frequency.  Musculoskeletal: Positive for back pain. Negative for myalgias and joint pain.  Skin: Negative.  Negative for itching and rash.  Neurological: Negative.  Negative for dizziness, tingling, tremors, sensory change, weakness and headaches.  Endo/Heme/Allergies: Negative.  Negative for environmental allergies.  Psychiatric/Behavioral: Negative.  Negative for depression, hallucinations and substance abuse.    Blood pressure 119/75, pulse 64, temperature 97.5 F (36.4 C), temperature source Oral, resp. rate 18, height 5\' 10"  (1.778 m), weight 80.287 kg (177 lb), SpO2 98.00%. Physical Exam  Constitutional: He is oriented to person, place, and time. He appears well-developed and well-nourished. No distress.  HENT:  Head: Normocephalic.  Mouth/Throat: No oropharyngeal exudate.  Eyes: EOM are normal.  Neck: Normal range of motion. Neck supple. No JVD present. No tracheal deviation present. No thyromegaly present.  Cardiovascular: Normal rate, regular rhythm and normal heart sounds.  Exam reveals no friction rub.   No murmur heard.      Right groin with no bruit  Respiratory: Effort normal and breath sounds normal. No respiratory distress. He has no wheezes. He has no rales.  GI: Soft. Bowel sounds are normal. He exhibits no distension. There is no tenderness. There is no rebound and no guarding.  Musculoskeletal: Normal range of motion. He exhibits no edema and no tenderness.  Lymphadenopathy:    He has no cervical adenopathy.  Neurological: He is oriented to person, place, and time.  Skin: Skin is warm. He is not diaphoretic. No erythema. No pallor.     Assessment/Plan  1. Chest pain equivalent 2. CAD s/p CABG 3. Hyperlipidemia 4. GERD 5. COPD  Plan:  I will admit the patient to cardiology and obtain 3 sets of cardiac markers to see if they are rising.  His back pain may represent his angina equivalent, but it is slightly different  in location when compared to the pain he had during his last hospitalization.  I will continue his Aspirin and Plavix but will start him on Imdur since he is likely to have recurrent pain.  If his cardiac markers are rising then he may need to undergo another cardiac cath, otherwise will optimize his medical therapy.    Adrieana Fennelly E 05/31/2011, 5:56 AM

## 2011-06-01 DIAGNOSIS — I200 Unstable angina: Secondary | ICD-10-CM

## 2011-06-01 LAB — CBC
MCH: 30.7 pg (ref 26.0–34.0)
MCHC: 33.2 g/dL (ref 30.0–36.0)
MCV: 92.6 fL (ref 78.0–100.0)
Platelets: 208 10*3/uL (ref 150–400)
RBC: 4.04 MIL/uL — ABNORMAL LOW (ref 4.22–5.81)
RDW: 13.2 % (ref 11.5–15.5)

## 2011-06-01 LAB — HEPARIN LEVEL (UNFRACTIONATED): Heparin Unfractionated: 0.55 IU/mL (ref 0.30–0.70)

## 2011-06-01 LAB — LIPID PANEL
Cholesterol: 102 mg/dL (ref 0–200)
Total CHOL/HDL Ratio: 2.6 RATIO

## 2011-06-01 MED ORDER — CARVEDILOL 3.125 MG PO TABS
3.1250 mg | ORAL_TABLET | Freq: Two times a day (BID) | ORAL | Status: DC
  Administered 2011-06-01 – 2011-06-02 (×3): 3.125 mg via ORAL
  Filled 2011-06-01 (×9): qty 1

## 2011-06-01 MED ORDER — ISOSORBIDE MONONITRATE 15 MG HALF TABLET
15.0000 mg | ORAL_TABLET | Freq: Every day | ORAL | Status: DC
  Administered 2011-06-01 – 2011-06-03 (×3): 15 mg via ORAL
  Filled 2011-06-01 (×3): qty 1

## 2011-06-01 NOTE — Progress Notes (Signed)
ANTICOAGULATION CONSULT NOTE - Follow Up Consult  Pharmacy Consult for Heparin Indication: chest pain/ACS  Patient Measurements: Height: 5\' 10"  (177.8 cm) Weight: 176 lb 5.9 oz (80 kg) IBW/kg (Calculated) : 73  Heparin dosing Weight: 79.7 kg  Labs:  Basename 06/01/11 1543 06/01/11 0600 05/31/11 1942 05/31/11 1811 05/31/11 1313 05/31/11 0513 05/31/11 0405 05/31/11 0403  HGB -- 12.4* -- -- 13.6 -- -- --  HCT -- 37.4* -- -- 39.2 -- -- 38.3*  PLT -- 208 -- -- 230 -- -- 217  APTT -- -- -- -- 46* 34 -- --  LABPROT -- -- -- -- 14.1 14.6 -- --  INR -- -- -- -- 1.07 1.12 -- --  HEPARINUNFRC 0.55 0.66 0.17* -- -- -- -- --  CREATININE -- -- -- -- 1.57* -- -- 1.74*  CKTOTAL -- -- -- 63 72 -- 81 --  CKMB -- -- -- 1.8 1.8 -- 2.0 --  TROPONINI -- -- -- 0.60* 0.82* -- 1.02* --   Assessment: 77 yo M on heparin for chest pain. Heparin leve 0.55l is therapeutic.   Goal of Therapy:  Heparin level 0.3-0.7 units/ml   Plan:  1. Continue heparin at 1450 units/hour. 2. Daily heparin level and CBC while on heparin.  Misty Stanley Stillinger 06/01/2011,4:33 PM

## 2011-06-01 NOTE — Progress Notes (Signed)
Patient's heart rate ranging 46-48, with blood pressure 102/54. Patient asymptomatic and in no distress. Informed MD on call.

## 2011-06-01 NOTE — Progress Notes (Signed)
Patient ID: Omar Benson, male   DOB: December 29, 1934, 77 y.o.   MRN: [id number] Subjective:  Still some chest discomfort. Symptoms are similar and different from prior presentation with NSTEMI. Objective:  Vital Signs in the last 24 hours: Temp:  [97.7 F (36.5 C)-98.5 F (36.9 C)] 98.5 F (36.9 C) (01/14 0400) Pulse Rate:  [49-61] 55  (01/14 0600) Resp:  [14-24] 17  (01/14 0600) BP: (99-158)/(51-82) 105/65 mmHg (01/14 0600) SpO2:  [94 %-99 %] 97 % (01/14 0749) Weight:  [79.7 kg (175 lb 11.3 oz)-80 kg (176 lb 5.9 oz)] 80 kg (176 lb 5.9 oz) (01/14 0500)  Intake/Output from previous day: 01/13 0701 - 01/14 0700 In: 1150.5 [P.O.:240; I.V.:910.5] Out: 1500 [Urine:1500] Intake/Output from this shift:    Physical Exam: Well appearing NAD HEENT: Unremarkable Neck:  No JVD, no thyromegally Lungs:  Clear with no wheezes, rales, or rhonchi. HEART:  Regular rate rhythm, no murmurs, no rubs, no clicks Abd:  Flat, positive bowel sounds, no organomegally, no rebound, no guarding Ext:  2 plus pulses, no edema, no cyanosis, no clubbing Skin:  No rashes no nodules Neuro:  CN II through XII intact, motor grossly intact  Lab Results:  Basename 06/01/11 0600 05/31/11 1313  WBC 4.7 4.2  HGB 12.4* 13.6  PLT 208 230    Basename 05/31/11 1313 05/31/11 0403  NA 136 134*  K 5.2* 4.2  CL 100 100  CO2 21 23  GLUCOSE 127* 104*  BUN 18 20  CREATININE 1.57* 1.74*    Basename 05/31/11 1811 05/31/11 1313  TROPONINI 0.60* 0.82*   Hepatic Function Panel  Basename 05/31/11 1313  PROT 7.7  ALBUMIN 3.7  AST 21  ALT 20  ALKPHOS 157*  BILITOT 0.4  BILIDIR --  IBILI --   No results found for this basename: CHOL in the last 72 hours No results found for this basename: PROTIME in the last 72 hours  Imaging: Dg Chest Portable 1 View  05/31/2011  *RADIOLOGY REPORT*  Clinical Data: Chest pain.  PORTABLE CHEST - 1 VIEW  Comparison: Chest radiograph performed 05/23/2011  Findings: The lungs are  well-aerated.  Mild bibasilar atelectasis is noted.  There is no evidence of pleural effusion or pneumothorax.  The cardiomediastinal silhouette is normal in size; the patient is status post median sternotomy, with evidence of prior CABG.  No acute osseous abnormalities are seen.  IMPRESSION: Mild bibasilar atelectasis noted; lungs otherwise clear.  Original Report Authenticated By: Tonia Ghent, M.D.    Cardiac Studies: Tele - NSR 12 lead ECG - NSR/SB with infero-lateral T wave inversion. Assessment/Plan:  1. [place] - his troponins are elevated. I have personally reviewed his angiograms. Meds and ECGs reviewed. Will follow up cardiac markers. I would like to start low dose coreg. I am concerned about symptomatic bradycardia. Will also start low dose Imdur. 2. Atrial fib - he is maintaining NSR on amiodarone. 3. HTN - his blood pressure is well controlled. Will follow.  LOS: 1 day    Lewayne Bunting 06/01/2011, 8:38 AM

## 2011-06-01 NOTE — Progress Notes (Signed)
ANTICOAGULATION CONSULT NOTE - Follow Up Consult  Pharmacy Consult for Heparin Indication: chest pain/ACS  Patient Measurements: Height: 5\' 10"  (177.8 cm) Weight: 176 lb 5.9 oz (80 kg) IBW/kg (Calculated) : 73  Heparin dosing Weight: 79.7 kg  Labs:  Basename 06/01/11 0600 05/31/11 1942 05/31/11 1811 05/31/11 1313 05/31/11 0513 05/31/11 0405 05/31/11 0403 05/29/11 0917  HGB 12.4* -- -- 13.6 -- -- -- --  HCT 37.4* -- -- 39.2 -- -- 38.3* --  PLT 208 -- -- 230 -- -- 217 --  APTT -- -- -- 46* 34 -- -- --  LABPROT -- -- -- 14.1 14.6 -- -- --  INR -- -- -- 1.07 1.12 -- -- --  HEPARINUNFRC 0.66 0.17* -- 0.13* -- -- -- --  CREATININE -- -- -- 1.57* -- -- 1.74* 1.8*  CKTOTAL -- -- 63 72 -- 81 -- --  CKMB -- -- 1.8 1.8 -- 2.0 -- --  TROPONINI -- -- 0.60* 0.82* -- 1.02* -- --   Assessment: 77 yo M on heparin for chest pain. Heparin level is therapeutic. No bleeding noted.  Goal of Therapy:  Heparin level 0.3-0.7 units/ml   Plan:  1. Continue heparin at 1450 units/hour. 2. Heparin level in 8 hours to confirm appropriate dose since this is the first therapeutic level. 3. Daily heparin level and CBC while on heparin.  Abdul, Beirne 06/01/2011,8:29 AM

## 2011-06-02 ENCOUNTER — Other Ambulatory Visit: Payer: Self-pay

## 2011-06-02 DIAGNOSIS — R079 Chest pain, unspecified: Secondary | ICD-10-CM

## 2011-06-02 LAB — CARDIAC PANEL(CRET KIN+CKTOT+MB+TROPI)
CK, MB: 1.7 ng/mL (ref 0.3–4.0)
Relative Index: INVALID (ref 0.0–2.5)
Troponin I: 0.42 ng/mL (ref <0.30)

## 2011-06-02 LAB — BASIC METABOLIC PANEL
Chloride: 100 mEq/L (ref 96–112)
GFR calc Af Amer: 47 mL/min — ABNORMAL LOW (ref >90)
GFR calc non Af Amer: 41 mL/min — ABNORMAL LOW (ref >90)
Glucose, Bld: 98 mg/dL (ref 70–99)
Potassium: 4.1 mEq/L (ref 3.5–5.1)
Sodium: 137 mEq/L (ref 135–145)

## 2011-06-02 LAB — CBC
HCT: 39.8 % (ref 39.0–52.0)
Platelets: 253 10*3/uL (ref 150–400)
RBC: 4.35 MIL/uL (ref 4.22–5.81)
RDW: 13 % (ref 11.5–15.5)
WBC: 5.3 10*3/uL (ref 4.0–10.5)

## 2011-06-02 MED ORDER — DIAZEPAM 5 MG PO TABS: 2.5000 mg | ORAL_TABLET | Freq: Four times a day (QID) | ORAL | Status: AC | PRN

## 2011-06-02 MED ORDER — ISOSORBIDE MONONITRATE ER 30 MG PO TB24: 30.0000 mg | ORAL_TABLET | Freq: Every day | ORAL | Status: DC

## 2011-06-02 MED ORDER — CARVEDILOL 3.125 MG PO TABS: 3.1250 mg | ORAL_TABLET | Freq: Two times a day (BID) | ORAL | Status: DC

## 2011-06-02 MED ORDER — NITROGLYCERIN 0.4 MG SL SUBL: 0.4000 mg | SUBLINGUAL_TABLET | SUBLINGUAL | Status: DC | PRN

## 2011-06-02 NOTE — Discharge Summary (Signed)
Please see my progress note from the same date.  Reviewed in great detail with patient and family.

## 2011-06-02 NOTE — Progress Notes (Signed)
ANTICOAGULATION CONSULT NOTE - Follow Up Consult  Pharmacy Consult for Heparin Indication: chest pain/ACS  Patient Measurements: Height: 5\' 10"  (177.8 cm) Weight: 171 lb 4.8 oz (77.7 kg) IBW/kg (Calculated) : 73  Heparin dosing Weight: 79.7 kg  Labs:  Basename 06/02/11 0500 06/01/11 1543 06/01/11 0600 05/31/11 1811 05/31/11 1313 05/31/11 0513 05/31/11 0405 05/31/11 0403  HGB 13.5 -- 12.4* -- -- -- -- --  HCT 39.8 -- 37.4* -- 39.2 -- -- --  PLT 253 -- 208 -- 230 -- -- --  APTT -- -- -- -- 46* 34 -- --  LABPROT -- -- -- -- 14.1 14.6 -- --  INR -- -- -- -- 1.07 1.12 -- --  HEPARINUNFRC 0.65 0.55 0.66 -- -- -- -- --  CREATININE 1.60* -- -- -- 1.57* -- -- 1.74*  CKTOTAL -- -- -- 63 72 -- 81 --  CKMB -- -- -- 1.8 1.8 -- 2.0 --  TROPONINI -- -- -- 0.60* 0.82* -- 1.02* --   Assessment: 77 yo M on heparin for chest pain. Heparin level is therapeutic at 0.65. No bleeding noted.  CBC stable.  Goal of Therapy:  Heparin level 0.3-0.7 units/ml   Plan:  1. Continue heparin at 1450 units/hour. 2. Daily heparin level and CBC while on heparin.  Omar Benson, Omar Benson 06/02/2011,8:34 AM

## 2011-06-02 NOTE — Discharge Summary (Signed)
CARDIOLOGY DISCHARGE SUMMARY   Patient ID: LOY MCCARTT MRN: [id number] DOB/AGE: [birth year] 77 y.o.  Admit date: 05/31/2011 Discharge date: 06/02/2011  Primary Discharge Diagnosis:  Chest pain with elevated cardiac enzymes Secondary Discharge Diagnosis:  Patient Active Problem List  Diagnoses  . HYPERLIPIDEMIA  . GOUT  . ANEMIA-IRON DEFICIENCY  . HYPERTENSION  . CAD  . Atrial fibrillation  . ATRIAL FLUTTER, CHRONIC  . BRADYCARDIA  . AAA  . PERIPHERAL VASCULAR DISEASE  . GERD  . DIVERTICULOSIS, COLON  . RENAL INSUFFICIENCY  . LOW BACK PAIN  . PSA, INCREASED  . COLONIC POLYPS, HX OF  . GASTROINTESTINAL HEMORRHAGE, HX OF  . NEPHROLITHIASIS, HX OF  . ANXIETY  . GASTRITIS  . Hypothyroid  . Preventative health care  . COPD (chronic obstructive pulmonary disease)  . Non-STEMI (non-ST elevated myocardial infarction)    Procedures: 2-D echocardiogram  Hospital Course: Mr. Mccleery is a 77 year old male with a history of coronary artery disease. He was discharged after a non-ST segment elevation MI secondary to thrombus and treated medically on 05/27/2011. He developed chest pain which persisted despite sublingual nitroglycerin x2 and came to the emergency room where he was admitted for further evaluation and treatment.  His CK-MBs were negative but his troponins were elevated although they were trending down. His chest pain had similarities to his MI pain but was not completely the same. Dr. [name] reviewed the angiograms but felt that continued medical therapy was warranted. He had been started on Imdur. His chest pain improved and then resolved. He had bradycardia and had not previously been started on a beta blocker. However, Dr. [name] felt that he might tolerate low-dose Coreg and this was added to his medication regimen. He was maintaining sinus rhythm on amiodarone. His blood pressure was well-controlled. Dr. [name] was concerned that anxiety might be part of his problem and  Valium is to be added to his medication regimen on a temporary basis. On 06/02/2011, Mr. Woolum was seen by Dr. [name]. He was ambulating without chest pain or shortness of breath and considered stable for discharge, in improved condition. Labs:   Lab Results  Component Value Date   WBC 5.3 06/02/2011   HGB 13.5 06/02/2011   HCT 39.8 06/02/2011   MCV 91.5 06/02/2011   PLT 253 06/02/2011    Lab 06/02/11 0500 05/31/11 1313  NA 137 --  K 4.1 --  CL 100 --  CO2 27 --  BUN 17 --  CREATININE 1.60* --  CALCIUM 9.7 --  PROT -- 7.7  BILITOT -- 0.4  ALKPHOS -- 157*  ALT -- 20  AST -- 21  GLUCOSE 98 --    Basename 05/31/11 1811 05/31/11 1313 05/31/11 0405  CKTOTAL 63 72 81  CKMB 1.8 1.8 2.0  CKMBINDEX -- -- --  TROPONINI 0.60* 0.82* 1.02*   Lipid Panel     Component Value Date/Time   CHOL 102 06/01/2011 0600   TRIG 127 06/01/2011 0600   HDL 39* 06/01/2011 0600   CHOLHDL 2.6 06/01/2011 0600   VLDL 25 06/01/2011 0600   LDLCALC 38 06/01/2011 0600    No components found with this basename: POCBNP:3     Radiology: 05/31/2011  PORTABLE CHEST - 1 VIEW Comparison: Chest radiograph performed 05/23/2011 Findings: The lungs are well-aerated. Mild bibasilar atelectasis is noted. There is no evidence of pleural effusion or pneumothorax. The cardiomediastinal silhouette is normal in size; the patient is status post median sternotomy, with evidence of prior CABG. No  acute osseous abnormalities are seen.  IMPRESSION: Mild bibasilar atelectasis noted; lungs otherwise clear.   EKG: 01-Jun-2011 07:39:10  Sinus bradycardia with sinus arrhythmia Left axis deviation Nonspecific ST and T wave abnormality Abnormal ECG ST changes are new  Echo: 05/26/2011  Study Conclusions Left ventricle: Despite the recent MI, I can not see any definite wall motion aqbnormality. The cavity size was normal. Wall thickness was increased in a pattern of mild LVH. The estimated ejection fraction was  60%.    FOLLOW UP PLANS AND APPOINTMENTS Discharge Orders    Future Appointments: Provider: Department: Dept Phone: Center:   06/09/2011 2:30 PM Beatrice Lecher, PA [facility] [phone] [facility]   12/22/2011 1:00 PM Oliver Barre, MD [facility] [phone] [facility]   01/11/2012 1:30 PM Barbaraann Share, MD [facility] [phone] None     Future Orders Please Complete By Expires   Diet - low sodium heart healthy      Increase activity slowly        Current Discharge Medication List    START taking these medications   Details  carvedilol (COREG) 3.125 MG tablet PT NOT TO TAKE THIS Take 1 tablet (3.125 mg total) by mouth 2 (two) times daily with a meal. Qty: 60 tablet, Refills: 11    diazepam (VALIUM) 5 MG tablet Take 0.5 tablets (2.5 mg total) by mouth every 6 (six) hours as needed for anxiety. Qty: 30 tablet, Refills: 0    isosorbide mononitrate (IMDUR) 30 MG 24 hr tablet Take 1 tablet (30 mg total) by mouth daily. Qty: 30 tablet, Refills: 11    nitroGLYCERIN (NITROSTAT) 0.4 MG SL tablet Place 1 tablet (0.4 mg total) under the tongue every 5 (five) minutes as needed for chest pain. Qty: 25 tablet, Refills: 3      CONTINUE these medications which have NOT CHANGED   Details  acetaminophen (TYLENOL) 500 MG tablet Take 500 mg by mouth every 8 (eight) hours as needed. For pain    allopurinol (ZYLOPRIM) 300 MG tablet Take 300 mg by mouth daily.      amiodarone (PACERONE) 100 MG tablet Take 100 mg by mouth daily.      aspirin EC 81 MG tablet Take 81 mg by mouth daily.    clopidogrel (PLAVIX) 75 MG tablet Take 75 mg by mouth daily.    levothyroxine (SYNTHROID) 100 MCG tablet Take 100 mcg by mouth daily.      pantoprazole (PROTONIX) 20 MG tablet Take 20 mg by mouth daily.    rosuvastatin (CRESTOR) 10 MG tablet Take 10 mg by mouth daily.    tiotropium (SPIRIVA) 18 MCG inhalation capsule Place 18 mcg into inhaler and inhale daily.         Follow-up Information     Follow up with Oliver Barre, MD  As needed.         BRING ALL MEDICATIONS WITH YOU TO FOLLOW UP APPOINTMENTS  Time spent with patient to include physician time: Signed: Theodore Demark 06/02/2011, 9:49 AM Co-Sign MD  Addendum:  Pt rec'd Coreg 3.125 this am. Approx 3 hours later, pt HR dropped into the 30s for about 10 seconds and was sustained in the 40s. Pt felt symptomatic with this and he is very uncomfortable taking a BB because of the low heart rate. OK to stop it and discuss with MD in follow-up.

## 2011-06-02 NOTE — Progress Notes (Signed)
Patient ID: Omar Benson, male   DOB: April 22, 1935, 77 y.o.   MRN: [id number] Subjective:  C/o mid-scapular pain. No chest pain or sob. Somewhat anxious.  Objective:  Vital Signs in the last 24 hours: Temp:  [97.3 F (36.3 C)-97.9 F (36.6 C)] 97.8 F (36.6 C) (01/15 0758) Pulse Rate:  [49-65] 51  (01/15 0758) Resp:  [16-20] 20  (01/15 0758) BP: (121-146)/(72-88) 138/75 mmHg (01/15 0758) SpO2:  [94 %-98 %] 97 % (01/15 0759) Weight:  [77.7 kg (171 lb 4.8 oz)] 77.7 kg (171 lb 4.8 oz) (01/15 0100)  Intake/Output from previous day: 01/14 0701 - 01/15 0700 In: 1825.5 [P.O.:1440; I.V.:385.5] Out: 1975 [Urine:1975] Intake/Output from this shift: Total I/O In: 10 [I.V.:10] Out: -   Physical Exam: Well appearing NAD HEENT: Unremarkable Neck:  No JVD, no thyromegally Lungs:  Clear with no wheezes, rales, or rhonchi HEART:  Regular rate rhythm, no murmurs, no rubs, no clicks Abd:  Flat, positive bowel sounds, no organomegally, no rebound, no guarding Ext:  2 plus pulses, no edema, no cyanosis, no clubbing Skin:  No rashes no nodules Neuro:  CN II through XII intact, motor grossly intact  Lab Results:  Basename 06/02/11 0500 06/01/11 0600  WBC 5.3 4.7  HGB 13.5 12.4*  PLT 253 208    Basename 06/02/11 0500 05/31/11 1313  NA 137 136  K 4.1 5.2*  CL 100 100  CO2 27 21  GLUCOSE 98 127*  BUN 17 18  CREATININE 1.60* 1.57*    Basename 05/31/11 1811 05/31/11 1313  TROPONINI 0.60* 0.82*   Hepatic Function Panel  Basename 05/31/11 1313  PROT 7.7  ALBUMIN 3.7  AST 21  ALT 20  ALKPHOS 157*  BILITOT 0.4  BILIDIR --  IBILI --    Basename 06/01/11 0600  CHOL 102   No results found for this basename: PROTIME in the last 72 hours  Imaging: No results found.  Cardiac Studies: Tele - NSR/Sinus brady. Assessment/Plan:  1. Chest pain with elevated cardiac enzymes - his enzymes remain persistently elevated. I do not think he has ongoing ischemia. I have started low dose  coreg despite his bradycardia. He appears to be tolerating. He will be discharged on Imdur 30 mg daily. 2. Atrial fib - continue low dose amio. 3. Anxiety - this may be part of his problem. I have recommended valium 2.5-5 mg daily as needed. He will need a new precription for this.  LOS: 2 days    Lewayne Bunting 06/02/2011, 9:07 AM

## 2011-06-03 LAB — CBC
MCH: 31.4 pg (ref 26.0–34.0)
MCHC: 34.6 g/dL (ref 30.0–36.0)
Platelets: 234 10*3/uL (ref 150–400)

## 2011-06-03 LAB — HEPARIN LEVEL (UNFRACTIONATED): Heparin Unfractionated: <0.1 IU/mL — ABNORMAL LOW (ref 0.30–0.70)

## 2011-06-03 NOTE — Progress Notes (Signed)
Pt discharged to home, as ordered.  Pt and spouse educated on discharge instructions, prescriptions, and follow up appts.  Both verbalized their understanding of teachings.  PIV removed prior to discharge.  Pt transported via wheel chair to entrance where he will be going home with his spouse.   06/03/2011 2:13 PM  Kaidan Harpster, Murtis Sink

## 2011-06-03 NOTE — Progress Notes (Signed)
Patient ID: Omar Benson, male   DOB: Jan 12, 1935, 77 y.o.   MRN: [id number] Subjective:  No additional bradycardia Objective:  Vital Signs in the last 24 hours: Temp:  [97.2 F (36.2 C)-97.8 F (36.6 C)] 97.5 F (36.4 C) (01/16 0808) Pulse Rate:  [48-55] 52  (01/16 0808) Resp:  [18-22] 20  (01/16 0808) BP: (114-127)/(63-79) 114/79 mmHg (01/16 0808) SpO2:  [96 %-98 %] 97 % (01/16 0833) Weight:  [80.6 kg (177 lb 11.1 oz)] 80.6 kg (177 lb 11.1 oz) (01/16 0036)  Intake/Output from previous day: 01/15 0701 - 01/16 0700 In: 490 [P.O.:480; I.V.:10] Out: 1350 [Urine:1350] Intake/Output from this shift: Total I/O In: 580 [P.O.:580] Out: -   Physical Exam: Well appearing NAD HEENT: Unremarkable Neck:  No JVD, no thyromegally Lymphatics:  No adenopathy Back:  No CVA tenderness Lungs:  Clear with no wheezes. HEART:  Regular brady rhythm, no murmurs, no rubs, no clicks Abd:  Flat, positive bowel sounds, no organomegally, no rebound, no guarding Ext:  2 plus pulses, no edema, no cyanosis, no clubbing Skin:  No rashes no nodules Neuro:  CN II through XII intact, motor grossly intact  Lab Results:  Basename 06/03/11 0352 06/02/11 0500  WBC 5.6 5.3  HGB 12.9* 13.5  PLT 234 253    Basename 06/02/11 0500 05/31/11 1313  NA 137 136  K 4.1 5.2*  CL 100 100  CO2 27 21  GLUCOSE 98 127*  BUN 17 18  CREATININE 1.60* 1.57*    Basename 06/02/11 0853 05/31/11 1811  TROPONINI 0.42* 0.60*   Hepatic Function Panel  Basename 05/31/11 1313  PROT 7.7  ALBUMIN 3.7  AST 21  ALT 20  ALKPHOS 157*  BILITOT 0.4  BILIDIR --  IBILI --    Basename 06/01/11 0600  CHOL 102   No results found for this basename: PROTIME in the last 72 hours  Imaging: No results found.  Cardiac Studies: Sinus brady Assessment/Plan:  1. [place] - his symptoms have resolved. He has chronic ischemic disease. Continue anti-anginals. He is intolerant to beta blockers secondary to severe bradycardia. 2.  Bradycardia - improve since yesterday. Coreg stopped. He will likely need a PPM in the future but no indication yet as bradycardia improved off of coreg. 3. Atrial fib - he is maintaining NSR on low dose amiodarone. Continue. 4. Disposition - discharge today. Follow up with Mr. Alben Spittle as scheduled and with me in 4-5 weeks. He should be discharged on Imdur 30 mg daily.  LOS: 3 days    Lewayne Bunting 06/03/2011, 10:26 AM

## 2011-06-09 ENCOUNTER — Encounter: Payer: Medicare Other | Admitting: Physician Assistant

## 2011-06-19 ENCOUNTER — Encounter: Payer: Medicare Other | Admitting: Physician Assistant

## 2011-06-22 ENCOUNTER — Encounter: Payer: Self-pay | Admitting: Physician Assistant

## 2011-06-22 ENCOUNTER — Ambulatory Visit (INDEPENDENT_AMBULATORY_CARE_PROVIDER_SITE_OTHER): Payer: Medicare Other | Admitting: Physician Assistant

## 2011-06-22 VITALS — BP 110/70 | HR 60 | Ht 70.0 in | Wt 178.0 lb

## 2011-06-22 DIAGNOSIS — N259 Disorder resulting from impaired renal tubular function, unspecified: Secondary | ICD-10-CM

## 2011-06-22 DIAGNOSIS — E785 Hyperlipidemia, unspecified: Secondary | ICD-10-CM

## 2011-06-22 DIAGNOSIS — I2510 Atherosclerotic heart disease of native coronary artery without angina pectoris: Secondary | ICD-10-CM

## 2011-06-22 DIAGNOSIS — R5383 Other fatigue: Secondary | ICD-10-CM

## 2011-06-22 DIAGNOSIS — I498 Other specified cardiac arrhythmias: Secondary | ICD-10-CM

## 2011-06-22 DIAGNOSIS — I10 Essential (primary) hypertension: Secondary | ICD-10-CM

## 2011-06-22 DIAGNOSIS — I4891 Unspecified atrial fibrillation: Secondary | ICD-10-CM

## 2011-06-22 LAB — BASIC METABOLIC PANEL
Calcium: 9.2 mg/dL (ref 8.4–10.5)
Creatinine, Ser: 1.8 mg/dL — ABNORMAL HIGH (ref 0.4–1.5)
GFR: 39.57 mL/min — ABNORMAL LOW (ref >60.00)

## 2011-06-22 NOTE — Assessment & Plan Note (Signed)
Recently placed on Crestor.  Check lipids and LFTs in 6 weeks.

## 2011-06-22 NOTE — Assessment & Plan Note (Signed)
Controlled.  

## 2011-06-22 NOTE — Progress Notes (Signed)
[address] [place], [place]  [address] Phone: [phone] Fax:  [phone]  Date:  06/22/2011   Name:  Omar Benson       DOB:  06-Oct-1934 MRN:  [id number]  PCP:  Dr. Jonny Ruiz Primary Cardiologist:  Dr. Lewayne Bunting    History of Present Illness: Omar Benson is a 77 y.o. male who presents for post hospital follow up.  He has a history of CAD, status post CABG, COPD, atrial fibrillation on amiodarone and atrial flutter status post prior catheter ablation.    He was admitted 1/5-1/9 with an NSTEMI.  At that time [facility] 05/25/11: LAD occluded, LIMA-LAD patent, ostial circumflex 50%, AV circumflex 70%, SVG-OM2 with extensive disease with thrombus (culprit vessel), RCA 80% and occluded, SVG-intermediate patent, SVG-PDA/PL A patent.  Given that the culprit vessel was a subtotally occluded heavily thrombotic graft to a smaller OM2, medical therapy was recommended.  Echocardiogram 05/26/11: No wall motion abnormalities, mild LVH, EF 60%.  He was readmitted 1/13-1/15 with recurrent chest pain.  Troponin was elevated but was trending down.  Angiograms were reviewed again but medical therapy was continued.  He has a history of bradycardia but was tried on a low dose of Coreg but his HR dropped into the 40s and the patient was not comfortable continuing.  This was d/c'd.  He was placed on Imdur.  Anxiety was thought to play a role as well and Valium was also given him a temporary basis.    Labs: Hemoglobin 12.9, potassium 4.1, creatinine 1.6, ALT 20, TSH 5.173, TC 102, TG 127, HDL 39, LDL 38.  Chest x-ray demonstrated bibasilar atelectasis.  Since d/c, he is doing well.  The patient denies chest pain, shortness of breath, syncope, orthopnea, PND or significant pedal edema.  He has had a couple episodes of weakness like he did in the hospital with a HR in the 30-40s.  No near syncope.   Past Medical History  Diagnosis Date  . Hypothyroidism 12/05/2008  . HYPERLIPIDEMIA 09/19/2008  . GOUT  09/19/2008  . ANEMIA-IRON DEFICIENCY 09/19/2008  . HYPERTENSION 09/19/2008  . CAD 12/05/2008    s/p CABG; NSTEMI 05/2011 - [facility] 05/25/11: LAD occluded, LIMA-LAD patent, ostial circumflex 50%, AV circumflex 70%, SVG-OM2 with extensive disease with thrombus (culprit vessel), RCA 80% and occluded, SVG-intermediate patent, SVG-PDA/PL A patent.  Given that the culprit vessel was a subtotally occluded heavily thrombotic graft to a smaller OM2, medical therapy was recommended.;   . Atrial fibrillation 04/19/2008    amiodarone rx;  Echocardiogram 05/26/11: No wall motion abnormalities, mild LVH, EF 60%.  . Atrial flutter 12/05/2008    s/p RFCA  . BRADYCARDIA 12/05/2008  . AAA 09/19/2008  . PERIPHERAL VASCULAR DISEASE 09/19/2008  . GERD 09/19/2008  . DIVERTICULOSIS, COLON 09/19/2008  . RENAL INSUFFICIENCY 09/19/2008  . LOW BACK PAIN 09/19/2008  . PSA, INCREASED 09/19/2008  . COLONIC POLYPS, HX OF 09/19/2008  . GASTROINTESTINAL HEMORRHAGE, HX OF 04/19/2008  . NEPHROLITHIASIS, HX OF 01/11/2009  . CORONARY ARTERY BYPASS GRAFT, HX OF 12/05/2008    A. LIMA-LAD, VG-RI, VG-OM2, VG-RPD/RPL;  B. 05/2011 - NSTEMI - CATH WITH 4/5 PATENT GRAFTS AND NEW THROMBUS IN DISTAL VG-OM2 - MED RX  . ANXIETY 06/13/2010  . Arthritis   . COPD (chronic obstructive pulmonary disease)     Current Outpatient Prescriptions  Medication Sig Dispense Refill  . acetaminophen (TYLENOL) 500 MG tablet Take 500 mg by mouth every 8 (eight) hours as needed. For pain      .  allopurinol (ZYLOPRIM) 300 MG tablet Take 300 mg by mouth daily.        Marland Kitchen amiodarone (PACERONE) 100 MG tablet Take 100 mg by mouth daily.        Marland Kitchen aspirin EC 81 MG tablet Take 81 mg by mouth daily.      . clopidogrel (PLAVIX) 75 MG tablet Take 75 mg by mouth daily.      . isosorbide mononitrate (IMDUR) 30 MG 24 hr tablet Take 1 tablet (30 mg total) by mouth daily.  30 tablet  11  . levothyroxine (SYNTHROID, LEVOTHROID) 100 MCG tablet Take 100 mcg by mouth daily.        . nitroGLYCERIN (NITROSTAT)  0.4 MG SL tablet Place 1 tablet (0.4 mg total) under the tongue every 5 (five) minutes as needed for chest pain.  25 tablet  3  . pantoprazole (PROTONIX) 20 MG tablet Take 20 mg by mouth daily.      . rosuvastatin (CRESTOR) 10 MG tablet Take 10 mg by mouth daily.      Marland Kitchen tiotropium (SPIRIVA) 18 MCG inhalation capsule Place 18 mcg into inhaler and inhale daily.          Allergies: Allergies  Allergen Reactions  . Doxycycline Hyclate     REACTION: unspecified  . Lipitor (Atorvastatin Calcium)     "nearly killed me"  . Promethazine Hcl     Phenergan  . Tetanus Toxoid     REACTION: Rash  . Tetracycline     REACTION: Esophagitis    History  Substance Use Topics  . Smoking status: Former Smoker -- 4.0 packs/day for 31 years    Quit date: 03/19/1983  . Smokeless tobacco: Not on file  . Alcohol Use: Yes     2     ROS:  Please see the history of present illness.   All other systems reviewed and negative.   PHYSICAL EXAM: VS:  BP 110/70  Pulse 60  Ht 5\' 10"  (1.778 m)  Wt 178 lb (80.74 kg)  BMI 25.54 kg/m2 Well nourished, well developed, in no acute distress HEENT: normal Neck: no JVD Cardiac:  normal S1, S2; RRR; no murmur Lungs:  clear to auscultation bilaterally, no wheezing, rhonchi or rales Abd: soft, nontender, no hepatomegaly Ext: no edema; RFA site without hematoma or bruit Skin: warm and dry Neuro:  CNs 2-12 intact, no focal abnormalities noted  EKG:  Sinus rhythm, heart rate 60, left axis deviation, poor R wave progression, nonspecific ST-T wave changes, no change from prior tracing  ASSESSMENT AND PLAN:

## 2011-06-22 NOTE — Assessment & Plan Note (Signed)
Repeat bmet today 

## 2011-06-22 NOTE — Assessment & Plan Note (Addendum)
He has had a couple of episodes since discharge to home that remind him of the symptoms he had when bradycardia was documented in the hospital.  He states that Dr. [name] has told him that he may eventually need a pacemaker.  This was while he was on Coreg.  He is off beta blockers now.  He is still on a low dose of amiodarone.  I will have him undergo a 48-hour Holter to assess for significant pauses.  Otherwise followup with Dr. [name] in 2 months.

## 2011-06-22 NOTE — Patient Instructions (Signed)
Your physician recommends that you schedule a follow-up appointment in: 2 MONTHS WITH DR. [name]  Your physician recommends that you return for lab work in: 6 WEEKS FOR FASTING LIVER/LIPID PANEL 272.4 HYPERLIPIDEMIA  Your physician has recommended that you wear a 48 HOUR holter monitor DX 427.31 A-FIB. Holter monitors are medical devices that record the heart's electrical activity. Doctors most often use these monitors to diagnose arrhythmias. Arrhythmias are problems with the speed or rhythm of the heartbeat. The monitor is a small, portable device. You can wear one while you do your normal daily activities. This is usually used to diagnose what is causing palpitations/syncope (passing out).  You have been referred to CARDIAC REHAB @ [facility] FATIGUE, CAD, HYPERLIPIDEMIA

## 2011-06-22 NOTE — Assessment & Plan Note (Signed)
Maintaining sinus rhythm 

## 2011-06-22 NOTE — Assessment & Plan Note (Signed)
Doing well.  Refer to cardiac rehabilitation.  Continue aspirin and Plavix.  Followup with Dr. [name] in 2 months.

## 2011-06-23 ENCOUNTER — Telehealth: Payer: Self-pay | Admitting: *Deleted

## 2011-06-23 ENCOUNTER — Encounter: Payer: Self-pay | Admitting: *Deleted

## 2011-06-23 NOTE — Telephone Encounter (Signed)
Message copied by Tarri Fuller on Tue Jun 23, 2011  2:53 PM ------      Message from: [place], [place] T      Created: Mon Jun 22, 2011  5:40 PM       Creatinine stable      Tereso Newcomer, [place]  5:40 PM 06/22/2011

## 2011-06-23 NOTE — Telephone Encounter (Signed)
called both #'s on file for pt but no answer at either. I will try again later. Danielle Rankin

## 2011-06-23 NOTE — Telephone Encounter (Signed)
This encounter was created in error - please disregard.

## 2011-06-24 ENCOUNTER — Telehealth: Payer: Self-pay | Admitting: *Deleted

## 2011-06-24 NOTE — Telephone Encounter (Signed)
Pt notified of lab results today and tried to explain to him that the creatinine #'s are a lot better than before but he did not understand that 1.8 was better than 1.60, he said it did not make sense the 1.8 was higher than the 1.60. I re-assured pt that 1.8 was a lot better than what his creatinine had been recently. Pt then gave verbal understanding to this today. Danielle Rankin

## 2011-06-24 NOTE — Telephone Encounter (Signed)
Pt notified of lab results today and tried to explain to him that the creatinine #'s are a lot better than before but he did not understand that 1.8 was better than 1.60, he said it did not make sense the 1.8 was higher than the 1.60. I re-assured pt that 1.8 was a lot better than what his creatinine had been recently. Pt then gave verbal understanding to this today. Carol Fiato   

## 2011-06-24 NOTE — Telephone Encounter (Signed)
Message copied by Tarri Fuller on Wed Jun 24, 2011  1:04 PM ------      Message from: [place], [place] T      Created: Mon Jun 22, 2011  5:40 PM       Creatinine stable      Tereso Newcomer, [place]  5:40 PM 06/22/2011

## 2011-07-03 ENCOUNTER — Encounter (INDEPENDENT_AMBULATORY_CARE_PROVIDER_SITE_OTHER): Payer: Medicare Other

## 2011-07-03 DIAGNOSIS — I2510 Atherosclerotic heart disease of native coronary artery without angina pectoris: Secondary | ICD-10-CM

## 2011-07-03 DIAGNOSIS — I4891 Unspecified atrial fibrillation: Secondary | ICD-10-CM

## 2011-07-23 ENCOUNTER — Encounter (HOSPITAL_COMMUNITY): Payer: Self-pay

## 2011-07-23 ENCOUNTER — Encounter (HOSPITAL_COMMUNITY)
Admission: RE | Admit: 2011-07-23 | Discharge: 2011-07-23 | Disposition: A | Discharge status: Home / Self Care | Payer: Medicare Other | Source: Ambulatory Visit | Attending: Internal Medicine | Admitting: Internal Medicine

## 2011-07-23 DIAGNOSIS — I4891 Unspecified atrial fibrillation: Secondary | ICD-10-CM | POA: Insufficient documentation

## 2011-07-23 DIAGNOSIS — Z951 Presence of aortocoronary bypass graft: Secondary | ICD-10-CM | POA: Insufficient documentation

## 2011-07-23 DIAGNOSIS — Z5189 Encounter for other specified aftercare: Secondary | ICD-10-CM | POA: Insufficient documentation

## 2011-07-23 DIAGNOSIS — K219 Gastro-esophageal reflux disease without esophagitis: Secondary | ICD-10-CM | POA: Insufficient documentation

## 2011-07-23 DIAGNOSIS — I498 Other specified cardiac arrhythmias: Secondary | ICD-10-CM | POA: Insufficient documentation

## 2011-07-23 DIAGNOSIS — I4892 Unspecified atrial flutter: Secondary | ICD-10-CM | POA: Insufficient documentation

## 2011-07-23 DIAGNOSIS — E785 Hyperlipidemia, unspecified: Secondary | ICD-10-CM | POA: Insufficient documentation

## 2011-07-23 DIAGNOSIS — I10 Essential (primary) hypertension: Secondary | ICD-10-CM | POA: Insufficient documentation

## 2011-07-23 DIAGNOSIS — I739 Peripheral vascular disease, unspecified: Secondary | ICD-10-CM | POA: Insufficient documentation

## 2011-07-23 DIAGNOSIS — J449 Chronic obstructive pulmonary disease, unspecified: Secondary | ICD-10-CM | POA: Insufficient documentation

## 2011-07-23 DIAGNOSIS — I2510 Atherosclerotic heart disease of native coronary artery without angina pectoris: Secondary | ICD-10-CM | POA: Insufficient documentation

## 2011-07-23 DIAGNOSIS — I252 Old myocardial infarction: Secondary | ICD-10-CM | POA: Insufficient documentation

## 2011-07-23 DIAGNOSIS — J4489 Other specified chronic obstructive pulmonary disease: Secondary | ICD-10-CM | POA: Insufficient documentation

## 2011-07-23 DIAGNOSIS — E039 Hypothyroidism, unspecified: Secondary | ICD-10-CM | POA: Insufficient documentation

## 2011-07-23 NOTE — Progress Notes (Signed)
Cardiac Rehab Medication Review by a Pharmacist  Does the patient  feel that his/her medications are working for him/her?  yes  Has the patient been experiencing any side effects to the medications prescribed?  no  Does the patient measure his/her own blood pressure or blood glucose at home?  yes   Does the patient have any problems obtaining medications due to transportation or finances?   no  Understanding of regimen: good Understanding of indications: good Potential of compliance: good    Pharmacist comments: Patient states that he used to take Prilosec OTC & would like to try prescription omeprazole.   Bayard Beaver. Saul Fordyce, PharmD  07/23/2011 8:37 AM

## 2011-07-27 ENCOUNTER — Encounter (HOSPITAL_COMMUNITY): Payer: Medicare Other

## 2011-07-29 ENCOUNTER — Encounter (HOSPITAL_COMMUNITY): Payer: Medicare Other

## 2011-07-29 ENCOUNTER — Encounter (HOSPITAL_COMMUNITY)
Admission: RE | Admit: 2011-07-29 | Discharge: 2011-07-29 | Disposition: A | Discharge status: Home / Self Care | Payer: Medicare Other | Source: Ambulatory Visit | Attending: Internal Medicine | Admitting: Internal Medicine

## 2011-07-29 NOTE — Progress Notes (Signed)
Pt started cardiac rehab today.  Pt tolerated light exercise without difficulty.  VSS.  Telemetry-SR, rare PAC.  Pt denied c/o chest pain or dyspnea.  Pt oriented to exercise equipment and routine.  Understanding verbalized.  Will continue to monitor the patient throughout  the program.

## 2011-07-30 NOTE — Progress Notes (Signed)
Omar Benson 77 y.o. male       Nutrition Screen                                                                    YES  NO Do you live in a nursing home?  X   Do you eat out more than 3 times/week?   X  If yes, how many times per week do you eat out? 3  Do you have food allergies?   X If yes, what are you allergic to?  Have you gained or lost more than 10 lbs without trying?               X If yes, how much weight have you lost and over what time period?  lbs gained or lost over  weeks/month  Do you want to lose weight?     X If yes, what is a goal weight or amount of weight you would like to lose?  lb  Do you eat alone most of the time?   X   Do you eat less than 2 meals/day?  X If yes, how many meals do you eat?  Do you drink more than 3 alcohol drinks/day?  X If yes, how many drinks per day?  Are you having trouble with constipation? *  X If yes, what are you doing to help relieve constipation?  Do you have financial difficulties with buying food?*    X   Are you experiencing regular nausea/ vomiting?*     X   Do you have a poor appetite? *                                        X   Do you have trouble chewing/swallowing? *   X    Pt with diagnoses of:  X CABG              X GERD          X COPD X Dyslipidemia  / HDL< 40 / LDL>70 / High TG      X %  Body fat >goal / Body Mass Index >25 X HTN / BP >120/80 X MI X Gout           Pt Risk Score   1       Diagnosis Risk Score  40       Total Risk Score   41                        X High Risk                Low Risk    HT: 68.25" Ht Readings from Last 1 Encounters:  07/23/11 5' 8.25" (1.734 m)    WT:   175.6 lb (79.8 kg) Wt Readings from Last 3 Encounters:  07/23/11 175 lb 14.8 oz (79.8 kg)  06/22/11 178 lb (80.74 kg)  06/03/11 177 lb 11.1 oz (80.6 kg)     IBW 70.7 113%IBW BMI 26.6 27.0%body fat  Meds reviewed: [facility] Past Medical History  Diagnosis Date  .  Hypothyroidism 12/05/2008  . HYPERLIPIDEMIA 09/19/2008  . GOUT  09/19/2008  . ANEMIA-IRON DEFICIENCY 09/19/2008  . HYPERTENSION 09/19/2008  . CAD 12/05/2008    s/p CABG; NSTEMI 05/2011 - LHC 05/25/11: LAD occluded, LIMA-LAD patent, ostial circumflex 50%, AV circumflex 70%, SVG-OM2 with extensive disease with thrombus (culprit vessel), RCA 80% and occluded, SVG-intermediate patent, SVG-PDA/PL A patent.  Given that the culprit vessel was a subtotally occluded heavily thrombotic graft to a smaller OM2, medical therapy was recommended.;   . Atrial fibrillation 04/19/2008    amiodarone rx;  Echocardiogram 05/26/11: No wall motion abnormalities, mild LVH, EF 60%.  . Atrial flutter 12/05/2008    s/p RFCA  . BRADYCARDIA 12/05/2008  . AAA 09/19/2008  . PERIPHERAL VASCULAR DISEASE 09/19/2008  . GERD 09/19/2008  . DIVERTICULOSIS, COLON 09/19/2008  . RENAL INSUFFICIENCY 09/19/2008  . LOW BACK PAIN 09/19/2008  . PSA, INCREASED 09/19/2008  . COLONIC POLYPS, HX OF 09/19/2008  . GASTROINTESTINAL HEMORRHAGE, HX OF 04/19/2008  . NEPHROLITHIASIS, HX OF 01/11/2009  . CORONARY ARTERY BYPASS GRAFT, HX OF 12/05/2008    A. LIMA-LAD, VG-RI, VG-OM2, VG-RPD/RPL;  B. 05/2011 - NSTEMI - CATH WITH 4/5 PATENT GRAFTS AND NEW THROMBUS IN DISTAL VG-OM2 - MED RX  . ANXIETY 06/13/2010  . Arthritis   . COPD (chronic obstructive pulmonary disease)        Activity level: Pt is active   Wt goal: 175 lb ( 79.8 kg) Current tobacco use? No      Food/Drug Interaction? No       Labs:  Lipid Panel     Component Value Date/Time   CHOL 102 06/01/2011 0600   TRIG 127 06/01/2011 0600   HDL 39* 06/01/2011 0600   CHOLHDL 2.6 06/01/2011 0600   VLDL 25 06/01/2011 0600   LDLCALC 38 06/01/2011 0600   Lab Results  Component Value Date   HGBA1C 5.4 05/31/2011   06/22/11 Glucose 100  LDL goal: < 70      PVD and > 2:      HTN, HDL, > 75 yo male Estimated Daily Nutrition Needs for: ? wt maintenance 2100-2400 Kcal , Total Fat 70-80gm, Saturated Fat 16-19 gm, Trans Fat 2.3-2.7 gm,  Sodium less than 1500 mg

## 2011-07-31 ENCOUNTER — Encounter (HOSPITAL_COMMUNITY): Payer: Medicare Other

## 2011-07-31 ENCOUNTER — Encounter (HOSPITAL_COMMUNITY)
Admission: RE | Admit: 2011-07-31 | Discharge: 2011-07-31 | Disposition: A | Discharge status: Home / Self Care | Payer: Medicare Other | Source: Ambulatory Visit | Attending: Internal Medicine | Admitting: Internal Medicine

## 2011-08-03 ENCOUNTER — Telehealth: Payer: Self-pay | Admitting: *Deleted

## 2011-08-03 ENCOUNTER — Encounter (HOSPITAL_COMMUNITY): Payer: Medicare Other

## 2011-08-03 ENCOUNTER — Telehealth: Payer: Self-pay | Admitting: Internal Medicine

## 2011-08-03 ENCOUNTER — Other Ambulatory Visit (INDEPENDENT_AMBULATORY_CARE_PROVIDER_SITE_OTHER): Payer: Medicare Other

## 2011-08-03 DIAGNOSIS — I2510 Atherosclerotic heart disease of native coronary artery without angina pectoris: Secondary | ICD-10-CM

## 2011-08-03 LAB — HEPATIC FUNCTION PANEL
ALT: 24 U/L (ref 0–53)
AST: 25 U/L (ref 0–37)
Albumin: 4 g/dL (ref 3.5–5.2)
Alkaline Phosphatase: 88 U/L (ref 39–117)
Total Protein: 7.1 g/dL (ref 6.0–8.3)

## 2011-08-03 LAB — LIPID PANEL
Cholesterol: 94 mg/dL (ref 0–200)
Triglycerides: 92 mg/dL (ref 0.0–149.0)

## 2011-08-03 NOTE — Telephone Encounter (Signed)
Pt calling wanting to know number for cholesterol on lab work he was called on today pls call

## 2011-08-03 NOTE — Telephone Encounter (Signed)
Pt aware of results 

## 2011-08-03 NOTE — Telephone Encounter (Signed)
Message copied by Tarri Fuller on Mon Aug 03, 2011  3:15 PM ------      Message from: [place], [place] T      Created: Mon Aug 03, 2011  1:36 PM       Cholesterol looks good.      Tereso Newcomer, PA-C  1:36 PM 08/03/2011

## 2011-08-03 NOTE — Telephone Encounter (Signed)
lmom labs great. Omar Benson

## 2011-08-05 ENCOUNTER — Encounter (HOSPITAL_COMMUNITY)
Admission: RE | Admit: 2011-08-05 | Discharge: 2011-08-05 | Disposition: A | Discharge status: Home / Self Care | Payer: Medicare Other | Source: Ambulatory Visit | Attending: Internal Medicine | Admitting: Internal Medicine

## 2011-08-05 ENCOUNTER — Encounter (HOSPITAL_COMMUNITY): Payer: Medicare Other

## 2011-08-07 ENCOUNTER — Encounter (HOSPITAL_COMMUNITY): Payer: Medicare Other

## 2011-08-10 ENCOUNTER — Encounter (HOSPITAL_COMMUNITY): Payer: Medicare Other

## 2011-08-12 ENCOUNTER — Encounter (HOSPITAL_COMMUNITY): Payer: Medicare Other

## 2011-08-12 ENCOUNTER — Encounter (HOSPITAL_COMMUNITY)
Admission: RE | Admit: 2011-08-12 | Discharge: 2011-08-12 | Disposition: A | Discharge status: Home / Self Care | Payer: Medicare Other | Source: Ambulatory Visit | Attending: Internal Medicine | Admitting: Internal Medicine

## 2011-08-14 ENCOUNTER — Encounter (HOSPITAL_COMMUNITY): Payer: Medicare Other

## 2011-08-17 ENCOUNTER — Encounter (HOSPITAL_COMMUNITY): Payer: Medicare Other

## 2011-08-19 ENCOUNTER — Encounter (HOSPITAL_COMMUNITY): Payer: Medicare Other

## 2011-08-21 ENCOUNTER — Encounter (HOSPITAL_COMMUNITY): Payer: Medicare Other

## 2011-08-21 ENCOUNTER — Encounter (HOSPITAL_COMMUNITY)
Admission: RE | Admit: 2011-08-21 | Discharge: 2011-08-21 | Disposition: A | Discharge status: Home / Self Care | Payer: Medicare Other | Source: Ambulatory Visit | Attending: Internal Medicine | Admitting: Internal Medicine

## 2011-08-21 DIAGNOSIS — E785 Hyperlipidemia, unspecified: Secondary | ICD-10-CM | POA: Insufficient documentation

## 2011-08-21 DIAGNOSIS — I4891 Unspecified atrial fibrillation: Secondary | ICD-10-CM | POA: Insufficient documentation

## 2011-08-21 DIAGNOSIS — E039 Hypothyroidism, unspecified: Secondary | ICD-10-CM | POA: Insufficient documentation

## 2011-08-21 DIAGNOSIS — Z5189 Encounter for other specified aftercare: Secondary | ICD-10-CM | POA: Insufficient documentation

## 2011-08-21 DIAGNOSIS — J4489 Other specified chronic obstructive pulmonary disease: Secondary | ICD-10-CM | POA: Insufficient documentation

## 2011-08-21 DIAGNOSIS — I739 Peripheral vascular disease, unspecified: Secondary | ICD-10-CM | POA: Insufficient documentation

## 2011-08-21 DIAGNOSIS — J449 Chronic obstructive pulmonary disease, unspecified: Secondary | ICD-10-CM | POA: Insufficient documentation

## 2011-08-21 DIAGNOSIS — I10 Essential (primary) hypertension: Secondary | ICD-10-CM | POA: Insufficient documentation

## 2011-08-21 DIAGNOSIS — I498 Other specified cardiac arrhythmias: Secondary | ICD-10-CM | POA: Insufficient documentation

## 2011-08-21 DIAGNOSIS — I4892 Unspecified atrial flutter: Secondary | ICD-10-CM | POA: Insufficient documentation

## 2011-08-21 DIAGNOSIS — K219 Gastro-esophageal reflux disease without esophagitis: Secondary | ICD-10-CM | POA: Insufficient documentation

## 2011-08-21 DIAGNOSIS — I2510 Atherosclerotic heart disease of native coronary artery without angina pectoris: Secondary | ICD-10-CM | POA: Insufficient documentation

## 2011-08-21 DIAGNOSIS — Z951 Presence of aortocoronary bypass graft: Secondary | ICD-10-CM | POA: Insufficient documentation

## 2011-08-21 DIAGNOSIS — I252 Old myocardial infarction: Secondary | ICD-10-CM | POA: Insufficient documentation

## 2011-08-24 ENCOUNTER — Encounter (HOSPITAL_COMMUNITY): Payer: Medicare Other

## 2011-08-26 ENCOUNTER — Encounter (HOSPITAL_COMMUNITY)
Admission: RE | Admit: 2011-08-26 | Discharge: 2011-08-26 | Disposition: A | Discharge status: Home / Self Care | Payer: Medicare Other | Source: Ambulatory Visit | Attending: Internal Medicine | Admitting: Internal Medicine

## 2011-08-26 ENCOUNTER — Encounter (HOSPITAL_COMMUNITY): Payer: Medicare Other

## 2011-08-28 ENCOUNTER — Encounter (HOSPITAL_COMMUNITY): Payer: Medicare Other

## 2011-08-28 ENCOUNTER — Encounter (HOSPITAL_COMMUNITY)
Admission: RE | Admit: 2011-08-28 | Discharge: 2011-08-28 | Disposition: A | Discharge status: Home / Self Care | Payer: Medicare Other | Source: Ambulatory Visit | Attending: Internal Medicine | Admitting: Internal Medicine

## 2011-08-30 ENCOUNTER — Ambulatory Visit (INDEPENDENT_AMBULATORY_CARE_PROVIDER_SITE_OTHER): Payer: Medicare Other | Admitting: Family Medicine

## 2011-08-30 VITALS — BP 134/56 | HR 59 | Temp 97.9°F | Resp 16 | Ht 67.5 in | Wt 175.0 lb

## 2011-08-30 DIAGNOSIS — L089 Local infection of the skin and subcutaneous tissue, unspecified: Secondary | ICD-10-CM

## 2011-08-30 DIAGNOSIS — W57XXXA Bitten or stung by nonvenomous insect and other nonvenomous arthropods, initial encounter: Secondary | ICD-10-CM

## 2011-08-30 MED ORDER — CEPHALEXIN 500 MG PO TABS: 500.0000 mg | ORAL_TABLET | Freq: Two times a day (BID) | ORAL | Status: AC

## 2011-08-30 NOTE — Progress Notes (Signed)
  Subjective:    Patient ID: Omar Benson, male    DOB: [birth year], 77 y.o.   MRN: [id number]  HPI Tick bite(deer); noticed on chest last night Red and tender around lesion.   PMH/ see problem list  Review of Systems     Objective:   Physical Exam  Constitutional: He appears well-developed.  Cardiovascular: Normal rate, regular rhythm and normal heart sounds.   Pulmonary/Chest: Effort normal and breath sounds normal.  Skin:       Erythema/induration; (R) chest       Assessment & Plan:   1. Skin infection  Cephalexin 500 MG tablet  2. Tick bite      Anticipatory guidance

## 2011-08-31 ENCOUNTER — Encounter (HOSPITAL_COMMUNITY): Payer: Medicare Other

## 2011-09-02 ENCOUNTER — Ambulatory Visit (INDEPENDENT_AMBULATORY_CARE_PROVIDER_SITE_OTHER): Payer: Medicare Other | Admitting: Internal Medicine

## 2011-09-02 ENCOUNTER — Encounter (HOSPITAL_COMMUNITY): Payer: Medicare Other

## 2011-09-02 ENCOUNTER — Encounter: Payer: Self-pay | Admitting: Internal Medicine

## 2011-09-02 VITALS — BP 122/68 | HR 60 | Ht 70.0 in | Wt 176.0 lb

## 2011-09-02 DIAGNOSIS — I714 Abdominal aortic aneurysm, without rupture: Secondary | ICD-10-CM

## 2011-09-02 DIAGNOSIS — I4891 Unspecified atrial fibrillation: Secondary | ICD-10-CM

## 2011-09-02 DIAGNOSIS — I214 Non-ST elevation (NSTEMI) myocardial infarction: Secondary | ICD-10-CM

## 2011-09-02 NOTE — Assessment & Plan Note (Signed)
The patient is currently stable on medical therapy. Review of his lipid profile and other parameters like his blood pressure are all good. I've encouraged him to increase his physical activity.

## 2011-09-02 NOTE — Progress Notes (Signed)
HPI [name] returns today for followup. He is a very pleasant 77 year old man with a history of coronary artery disease status post bypass surgery. He was admitted to the hospital several weeks ago with a non-ST elevation MI. At that time he presented with back pain. Cardiac catheterization demonstrated modest graft disease with a patent LIMA to the LAD. He has underlying three-vessel disease in his native coronary arteries and preserved left ventricular function. Since discharge from the hospital, he has had no chest or back pain and denies shortness of breath. He notes occasional mid sharp back pain which is different from his anginal equivalent. He has known minimally enlarged abdominal aorta. He is scheduled to undergo repeat ultrasound in the next few months. He denies peripheral edema or palpitations. A cardiac monitor was obtained which demonstrated sinus bradycardia but no long pauses. No atrial fibrillation was noted although we've had this problem in the past. He has been maintained in sinus rhythm very nicely up until now a low dose amiodarone. Allergies  Allergen Reactions  . Doxycycline Hyclate     esophagitis  . Lipitor (Atorvastatin Calcium) Other (See Comments)    Myalgia/myopathy  . Phenergan   . Promethazine Hcl Other (See Comments)    "puts him out"  . Tetracycline Other (See Comments)    Esophagitis  . Tetanus Toxoid Rash     Current Outpatient Prescriptions  Medication Sig Dispense Refill  . allopurinol (ZYLOPRIM) 300 MG tablet Take 300 mg by mouth daily.        Marland Kitchen amiodarone (PACERONE) 100 MG tablet Take 100 mg by mouth daily.        Marland Kitchen aspirin EC 81 MG tablet Take 162 mg by mouth daily.       . Cephalexin 500 MG tablet Take 1 tablet (500 mg total) by mouth 2 (two) times daily.  14 tablet  0  . clopidogrel (PLAVIX) 75 MG tablet Take 75 mg by mouth daily.      . isosorbide mononitrate (IMDUR) 30 MG 24 hr tablet Take 1 tablet (30 mg total) by mouth daily.  30 tablet  11  .  levothyroxine (SYNTHROID, LEVOTHROID) 100 MCG tablet Take 100 mcg by mouth daily.        . Multiple Vitamin (MULTIVITAMIN) tablet Take 1 tablet by mouth daily.      . nitroGLYCERIN (NITROSTAT) 0.4 MG SL tablet Place 1 tablet (0.4 mg total) under the tongue every 5 (five) minutes as needed for chest pain.  25 tablet  3  . pantoprazole (PROTONIX) 40 MG tablet Take 40 mg by mouth daily.      . rosuvastatin (CRESTOR) 10 MG tablet Take 10 mg by mouth daily.      Marland Kitchen tiotropium (SPIRIVA) 18 MCG inhalation capsule Place 18 mcg into inhaler and inhale daily.           Past Medical History  Diagnosis Date  . Hypothyroidism 12/05/2008  . HYPERLIPIDEMIA 09/19/2008  . GOUT 09/19/2008  . ANEMIA-IRON DEFICIENCY 09/19/2008  . HYPERTENSION 09/19/2008  . CAD 12/05/2008    s/p CABG; NSTEMI 05/2011 - LHC 05/25/11: LAD occluded, LIMA-LAD patent, ostial circumflex 50%, AV circumflex 70%, SVG-OM2 with extensive disease with thrombus (culprit vessel), RCA 80% and occluded, SVG-intermediate patent, SVG-PDA/PL A patent.  Given that the culprit vessel was a subtotally occluded heavily thrombotic graft to a smaller OM2, medical therapy was recommended.;   . Atrial fibrillation 04/19/2008    amiodarone rx;  Echocardiogram 05/26/11: No wall motion abnormalities, mild LVH,  EF 60%.  . Atrial flutter 12/05/2008    s/p RFCA  . BRADYCARDIA 12/05/2008  . AAA 09/19/2008  . PERIPHERAL VASCULAR DISEASE 09/19/2008  . GERD 09/19/2008  . DIVERTICULOSIS, COLON 09/19/2008  . RENAL INSUFFICIENCY 09/19/2008  . LOW BACK PAIN 09/19/2008  . PSA, INCREASED 09/19/2008  . COLONIC POLYPS, HX OF 09/19/2008  . GASTROINTESTINAL HEMORRHAGE, HX OF 04/19/2008  . NEPHROLITHIASIS, HX OF 01/11/2009  . CORONARY ARTERY BYPASS GRAFT, HX OF 12/05/2008    A. LIMA-LAD, VG-RI, VG-OM2, VG-RPD/RPL;  B. 05/2011 - NSTEMI - CATH WITH 4/5 PATENT GRAFTS AND NEW THROMBUS IN DISTAL VG-OM2 - MED RX  . ANXIETY 06/13/2010  . Arthritis   . COPD (chronic obstructive pulmonary disease)     ROS:    All systems reviewed and negative except as noted in the HPI.   Past Surgical History  Procedure Date  . Aorto-femoral bypass x5 - stress test neg 9/08   . Coronary artery bypass graft   . S/p afib ablation   . Cardiac catheterization 05/25/2011     Family History  Problem Relation Age of Onset  . Diabetes Father   . Diabetes Sister   . Multiple myeloma Mother   . Heart disease Paternal Uncle   . Heart disease Maternal Uncle   . Lymphoma Sister     half-sister     History   Social History  . Marital Status: Married    Spouse Name: Victorino Dike    Number of Children: N/A  . Years of Education: N/A   Occupational History  . self employeed - Contractor    Social History Main Topics  . Smoking status: Former Smoker -- 4.0 packs/day for 31 years    Quit date: 03/19/1983  . Smokeless tobacco: Not on file  . Alcohol Use: Yes     2  . Drug Use: No  . Sexually Active: Yes   Other Topics Concern  . Not on file   Social History Narrative   Married lives with his wife     BP 122/68  Pulse 60  Ht 5\' 10"  (1.778 m)  Wt 79.833 kg (176 lb)  BMI 25.25 kg/m2  Physical Exam:  Well appearing NAD HEENT: Unremarkable Neck:  No JVD, no thyromegally Lungs:  Clear with no wheezes, rales, or rhonchi. HEART:  Regular rate rhythm, no murmurs, no rubs, no clicks Abd:  soft, positive bowel sounds, no organomegally, no rebound, no guarding Ext:  2 plus pulses, no edema, no cyanosis, no clubbing Skin:  No rashes no nodules Neuro:  CN II through XII intact, motor grossly intact   Assess/Plan:

## 2011-09-02 NOTE — Assessment & Plan Note (Signed)
He has maintained sinus rhythm on low dose amiodarone. When he turns 77, we would consider initiation of anticoagulation.

## 2011-09-02 NOTE — Assessment & Plan Note (Signed)
Abdominal ultrasound to evaluate his aortic dimensions will be carried out in the next several weeks.

## 2011-09-02 NOTE — Patient Instructions (Signed)
Your physician wants you to follow-up in: 6 months with Dr Taylor You will receive a reminder letter in the mail two months in advance. If you don't receive a letter, please call our office to schedule the follow-up appointment.  

## 2011-09-04 ENCOUNTER — Encounter (HOSPITAL_COMMUNITY)
Admission: RE | Admit: 2011-09-04 | Discharge: 2011-09-04 | Disposition: A | Discharge status: Home / Self Care | Payer: Medicare Other | Source: Ambulatory Visit | Attending: Internal Medicine | Admitting: Internal Medicine

## 2011-09-04 ENCOUNTER — Encounter (HOSPITAL_COMMUNITY): Payer: Medicare Other

## 2011-09-07 ENCOUNTER — Encounter (HOSPITAL_COMMUNITY): Payer: Medicare Other

## 2011-09-09 ENCOUNTER — Encounter (HOSPITAL_COMMUNITY)
Admission: RE | Admit: 2011-09-09 | Discharge: 2011-09-09 | Disposition: A | Discharge status: Home / Self Care | Payer: Medicare Other | Source: Ambulatory Visit | Attending: Internal Medicine | Admitting: Internal Medicine

## 2011-09-09 ENCOUNTER — Encounter (HOSPITAL_COMMUNITY): Payer: Medicare Other

## 2011-09-11 ENCOUNTER — Encounter (HOSPITAL_COMMUNITY): Payer: Medicare Other

## 2011-09-11 ENCOUNTER — Encounter (HOSPITAL_COMMUNITY)
Admission: RE | Admit: 2011-09-11 | Discharge: 2011-09-11 | Disposition: A | Discharge status: Home / Self Care | Payer: Medicare Other | Source: Ambulatory Visit | Attending: Internal Medicine | Admitting: Internal Medicine

## 2011-09-14 ENCOUNTER — Encounter (HOSPITAL_COMMUNITY): Payer: Medicare Other

## 2011-09-15 ENCOUNTER — Other Ambulatory Visit: Payer: Self-pay | Admitting: Internal Medicine

## 2011-09-16 ENCOUNTER — Encounter (HOSPITAL_COMMUNITY): Payer: Medicare Other

## 2011-09-18 ENCOUNTER — Encounter (HOSPITAL_COMMUNITY): Payer: Medicare Other

## 2011-09-18 ENCOUNTER — Encounter (HOSPITAL_COMMUNITY)
Admission: RE | Admit: 2011-09-18 | Discharge: 2011-09-18 | Disposition: A | Discharge status: Home / Self Care | Payer: Medicare Other | Source: Ambulatory Visit | Attending: Internal Medicine | Admitting: Internal Medicine

## 2011-09-18 DIAGNOSIS — Z5189 Encounter for other specified aftercare: Secondary | ICD-10-CM | POA: Insufficient documentation

## 2011-09-18 DIAGNOSIS — I252 Old myocardial infarction: Secondary | ICD-10-CM | POA: Insufficient documentation

## 2011-09-18 DIAGNOSIS — E785 Hyperlipidemia, unspecified: Secondary | ICD-10-CM | POA: Insufficient documentation

## 2011-09-18 DIAGNOSIS — I10 Essential (primary) hypertension: Secondary | ICD-10-CM | POA: Insufficient documentation

## 2011-09-18 DIAGNOSIS — I498 Other specified cardiac arrhythmias: Secondary | ICD-10-CM | POA: Insufficient documentation

## 2011-09-18 DIAGNOSIS — Z951 Presence of aortocoronary bypass graft: Secondary | ICD-10-CM | POA: Insufficient documentation

## 2011-09-18 DIAGNOSIS — I4891 Unspecified atrial fibrillation: Secondary | ICD-10-CM | POA: Insufficient documentation

## 2011-09-18 DIAGNOSIS — I4892 Unspecified atrial flutter: Secondary | ICD-10-CM | POA: Insufficient documentation

## 2011-09-18 DIAGNOSIS — E039 Hypothyroidism, unspecified: Secondary | ICD-10-CM | POA: Insufficient documentation

## 2011-09-18 DIAGNOSIS — I739 Peripheral vascular disease, unspecified: Secondary | ICD-10-CM | POA: Insufficient documentation

## 2011-09-18 DIAGNOSIS — I2510 Atherosclerotic heart disease of native coronary artery without angina pectoris: Secondary | ICD-10-CM | POA: Insufficient documentation

## 2011-09-18 DIAGNOSIS — K219 Gastro-esophageal reflux disease without esophagitis: Secondary | ICD-10-CM | POA: Insufficient documentation

## 2011-09-18 DIAGNOSIS — J4489 Other specified chronic obstructive pulmonary disease: Secondary | ICD-10-CM | POA: Insufficient documentation

## 2011-09-18 DIAGNOSIS — J449 Chronic obstructive pulmonary disease, unspecified: Secondary | ICD-10-CM | POA: Insufficient documentation

## 2011-09-21 ENCOUNTER — Encounter (HOSPITAL_COMMUNITY): Payer: Medicare Other

## 2011-09-23 ENCOUNTER — Encounter (HOSPITAL_COMMUNITY): Payer: Medicare Other

## 2011-09-23 ENCOUNTER — Encounter (HOSPITAL_COMMUNITY)
Admission: RE | Admit: 2011-09-23 | Discharge: 2011-09-23 | Disposition: A | Discharge status: Home / Self Care | Payer: Medicare Other | Source: Ambulatory Visit | Attending: Internal Medicine | Admitting: Internal Medicine

## 2011-09-25 ENCOUNTER — Encounter (HOSPITAL_COMMUNITY): Payer: Medicare Other

## 2011-09-25 ENCOUNTER — Encounter (HOSPITAL_COMMUNITY)
Admission: RE | Admit: 2011-09-25 | Discharge: 2011-09-25 | Disposition: A | Discharge status: Home / Self Care | Payer: Medicare Other | Source: Ambulatory Visit | Attending: Internal Medicine | Admitting: Internal Medicine

## 2011-09-28 ENCOUNTER — Encounter (HOSPITAL_COMMUNITY): Payer: Medicare Other

## 2011-09-30 ENCOUNTER — Encounter (HOSPITAL_COMMUNITY)
Admission: RE | Admit: 2011-09-30 | Discharge: 2011-09-30 | Disposition: A | Discharge status: Home / Self Care | Payer: Medicare Other | Source: Ambulatory Visit | Attending: Internal Medicine | Admitting: Internal Medicine

## 2011-09-30 ENCOUNTER — Encounter (HOSPITAL_COMMUNITY): Payer: Medicare Other

## 2011-10-02 ENCOUNTER — Encounter (HOSPITAL_COMMUNITY)
Admission: RE | Admit: 2011-10-02 | Discharge: 2011-10-02 | Disposition: A | Discharge status: Home / Self Care | Payer: Medicare Other | Source: Ambulatory Visit | Attending: Internal Medicine | Admitting: Internal Medicine

## 2011-10-02 ENCOUNTER — Encounter (HOSPITAL_COMMUNITY): Payer: Medicare Other

## 2011-10-05 ENCOUNTER — Encounter (HOSPITAL_COMMUNITY): Payer: Medicare Other

## 2011-10-07 ENCOUNTER — Encounter (HOSPITAL_COMMUNITY)
Admission: RE | Admit: 2011-10-07 | Discharge: 2011-10-07 | Disposition: A | Discharge status: Home / Self Care | Payer: Medicare Other | Source: Ambulatory Visit | Attending: Internal Medicine | Admitting: Internal Medicine

## 2011-10-07 ENCOUNTER — Encounter (HOSPITAL_COMMUNITY): Payer: Medicare Other

## 2011-10-09 ENCOUNTER — Encounter (HOSPITAL_COMMUNITY)
Admission: RE | Admit: 2011-10-09 | Discharge: 2011-10-09 | Disposition: A | Discharge status: Home / Self Care | Payer: Medicare Other | Source: Ambulatory Visit | Attending: Internal Medicine | Admitting: Internal Medicine

## 2011-10-09 ENCOUNTER — Encounter (HOSPITAL_COMMUNITY): Payer: Medicare Other

## 2011-10-12 ENCOUNTER — Encounter (HOSPITAL_COMMUNITY): Payer: Medicare Other

## 2011-10-14 ENCOUNTER — Other Ambulatory Visit: Payer: Self-pay | Admitting: Pulmonary Disease

## 2011-10-14 ENCOUNTER — Encounter (HOSPITAL_COMMUNITY): Payer: Medicare Other

## 2011-10-14 ENCOUNTER — Encounter (HOSPITAL_COMMUNITY)
Admission: RE | Admit: 2011-10-14 | Discharge: 2011-10-14 | Disposition: A | Discharge status: Home / Self Care | Payer: Medicare Other | Source: Ambulatory Visit | Attending: Internal Medicine | Admitting: Internal Medicine

## 2011-10-16 ENCOUNTER — Encounter (HOSPITAL_COMMUNITY): Payer: Medicare Other

## 2011-10-19 ENCOUNTER — Encounter (HOSPITAL_COMMUNITY): Payer: Medicare Other

## 2011-10-21 ENCOUNTER — Encounter (HOSPITAL_COMMUNITY)
Admission: RE | Admit: 2011-10-21 | Discharge: 2011-10-21 | Disposition: A | Discharge status: Home / Self Care | Payer: Medicare Other | Source: Ambulatory Visit | Attending: Internal Medicine | Admitting: Internal Medicine

## 2011-10-21 ENCOUNTER — Encounter (HOSPITAL_COMMUNITY): Payer: Medicare Other

## 2011-10-21 DIAGNOSIS — Z5189 Encounter for other specified aftercare: Secondary | ICD-10-CM | POA: Insufficient documentation

## 2011-10-21 DIAGNOSIS — J449 Chronic obstructive pulmonary disease, unspecified: Secondary | ICD-10-CM | POA: Insufficient documentation

## 2011-10-21 DIAGNOSIS — I498 Other specified cardiac arrhythmias: Secondary | ICD-10-CM | POA: Insufficient documentation

## 2011-10-21 DIAGNOSIS — I2510 Atherosclerotic heart disease of native coronary artery without angina pectoris: Secondary | ICD-10-CM | POA: Insufficient documentation

## 2011-10-21 DIAGNOSIS — I739 Peripheral vascular disease, unspecified: Secondary | ICD-10-CM | POA: Insufficient documentation

## 2011-10-21 DIAGNOSIS — K219 Gastro-esophageal reflux disease without esophagitis: Secondary | ICD-10-CM | POA: Insufficient documentation

## 2011-10-21 DIAGNOSIS — I4891 Unspecified atrial fibrillation: Secondary | ICD-10-CM | POA: Insufficient documentation

## 2011-10-21 DIAGNOSIS — E785 Hyperlipidemia, unspecified: Secondary | ICD-10-CM | POA: Insufficient documentation

## 2011-10-21 DIAGNOSIS — I252 Old myocardial infarction: Secondary | ICD-10-CM | POA: Insufficient documentation

## 2011-10-21 DIAGNOSIS — I10 Essential (primary) hypertension: Secondary | ICD-10-CM | POA: Insufficient documentation

## 2011-10-21 DIAGNOSIS — Z951 Presence of aortocoronary bypass graft: Secondary | ICD-10-CM | POA: Insufficient documentation

## 2011-10-21 DIAGNOSIS — E039 Hypothyroidism, unspecified: Secondary | ICD-10-CM | POA: Insufficient documentation

## 2011-10-21 DIAGNOSIS — J4489 Other specified chronic obstructive pulmonary disease: Secondary | ICD-10-CM | POA: Insufficient documentation

## 2011-10-21 DIAGNOSIS — I4892 Unspecified atrial flutter: Secondary | ICD-10-CM | POA: Insufficient documentation

## 2011-10-23 ENCOUNTER — Encounter (HOSPITAL_COMMUNITY): Payer: Medicare Other

## 2011-10-26 ENCOUNTER — Encounter (HOSPITAL_COMMUNITY): Payer: Medicare Other

## 2011-10-28 ENCOUNTER — Encounter (HOSPITAL_COMMUNITY): Payer: Medicare Other

## 2011-10-28 ENCOUNTER — Encounter (HOSPITAL_COMMUNITY)
Admission: RE | Admit: 2011-10-28 | Discharge: 2011-10-28 | Disposition: A | Discharge status: Home / Self Care | Payer: Medicare Other | Source: Ambulatory Visit | Attending: Internal Medicine | Admitting: Internal Medicine

## 2011-10-30 ENCOUNTER — Encounter (HOSPITAL_COMMUNITY): Payer: Medicare Other

## 2011-11-02 ENCOUNTER — Ambulatory Visit (HOSPITAL_COMMUNITY): Payer: Medicare Other

## 2011-11-04 ENCOUNTER — Inpatient Hospital Stay (HOSPITAL_COMMUNITY): Admission: RE | Admit: 2011-11-04 | Payer: Medicare Other | Source: Ambulatory Visit

## 2011-11-06 ENCOUNTER — Ambulatory Visit (HOSPITAL_COMMUNITY): Payer: Medicare Other

## 2011-11-09 ENCOUNTER — Ambulatory Visit (HOSPITAL_COMMUNITY): Payer: Medicare Other

## 2011-11-11 ENCOUNTER — Ambulatory Visit (HOSPITAL_COMMUNITY): Payer: Medicare Other

## 2011-11-13 ENCOUNTER — Ambulatory Visit (HOSPITAL_COMMUNITY): Payer: Medicare Other

## 2011-11-16 ENCOUNTER — Ambulatory Visit (HOSPITAL_COMMUNITY): Payer: Medicare Other

## 2011-11-18 ENCOUNTER — Ambulatory Visit (HOSPITAL_COMMUNITY): Payer: Medicare Other

## 2011-11-20 ENCOUNTER — Ambulatory Visit (HOSPITAL_COMMUNITY): Payer: Medicare Other

## 2011-11-23 ENCOUNTER — Inpatient Hospital Stay (HOSPITAL_COMMUNITY): Admission: RE | Admit: 2011-11-23 | Payer: Medicare Other | Source: Ambulatory Visit

## 2011-11-25 ENCOUNTER — Ambulatory Visit (HOSPITAL_COMMUNITY): Payer: Medicare Other

## 2011-11-26 ENCOUNTER — Other Ambulatory Visit: Payer: Self-pay | Admitting: Cardiology

## 2011-11-26 DIAGNOSIS — I714 Abdominal aortic aneurysm, without rupture: Secondary | ICD-10-CM

## 2011-11-27 ENCOUNTER — Ambulatory Visit (HOSPITAL_COMMUNITY): Payer: Medicare Other

## 2011-12-02 ENCOUNTER — Ambulatory Visit (INDEPENDENT_AMBULATORY_CARE_PROVIDER_SITE_OTHER): Payer: Medicare Other | Admitting: Internal Medicine

## 2011-12-02 ENCOUNTER — Encounter (INDEPENDENT_AMBULATORY_CARE_PROVIDER_SITE_OTHER): Payer: Medicare Other

## 2011-12-02 ENCOUNTER — Encounter: Payer: Self-pay | Admitting: Internal Medicine

## 2011-12-02 VITALS — BP 124/70 | HR 54 | Temp 97.5°F | Ht 70.0 in | Wt 175.2 lb

## 2011-12-02 DIAGNOSIS — L0390 Cellulitis, unspecified: Secondary | ICD-10-CM

## 2011-12-02 DIAGNOSIS — I714 Abdominal aortic aneurysm, without rupture: Secondary | ICD-10-CM

## 2011-12-02 DIAGNOSIS — I10 Essential (primary) hypertension: Secondary | ICD-10-CM

## 2011-12-02 DIAGNOSIS — L0291 Cutaneous abscess, unspecified: Secondary | ICD-10-CM

## 2011-12-02 DIAGNOSIS — J449 Chronic obstructive pulmonary disease, unspecified: Secondary | ICD-10-CM

## 2011-12-02 DIAGNOSIS — J4489 Other specified chronic obstructive pulmonary disease: Secondary | ICD-10-CM

## 2011-12-02 MED ORDER — TRIAMCINOLONE ACETONIDE 0.1 % EX CREA: TOPICAL_CREAM | Freq: Two times a day (BID) | CUTANEOUS | Status: DC

## 2011-12-02 MED ORDER — CEPHALEXIN 500 MG PO CAPS: 500.0000 mg | ORAL_CAPSULE | Freq: Four times a day (QID) | ORAL | Status: AC

## 2011-12-02 NOTE — Patient Instructions (Addendum)
Take all new medications as prescribed Continue all other medications as before  

## 2011-12-03 NOTE — Progress Notes (Shared)
Cardiac Rehabilitation Program Outcomes Report   Orientation:  07/23/2011 Graduate Date:  Discharge Date:  11/23/2011 # of sessions completed: 19  Cardiologist: Andi Hence MD:   Class Time:  1315  A.  Exercise Program:  Tolerates exercise @ 5.0 METS for 30 minutes, Poor attendance due to work schedule and Discharged Poor attendance also due to insurance co-payment  B.  Mental Health:    C.  Education/Instruction/Skills  Knows THR for exercise, Uses Perceived Exertion Scale and Attended 3 education classes    D.  Nutrition/Weight Control/Body Composition:  Patient has lost 1.2 kg   E.  Blood Lipids    Lab Results  Component Value Date   CHOL 94 08/03/2011   HDL 44.30 08/03/2011   LDLCALC 31 08/03/2011   LDLDIRECT 114.5 12/10/2009   TRIG 92.0 08/03/2011   CHOLHDL 2 08/03/2011    F.  Lifestyle Changes:    G.  Symptoms noted with exercise:  Musculoskeletal problems, Exaggerated post exercise hypotension, Fatigue, Resting hypertension and Exertional hypertension  Report Completed By:  Electronically signed by Harriett Sine MS on Wednesday December 03 2011 at 1018  Comments:

## 2011-12-06 ENCOUNTER — Encounter: Payer: Self-pay | Admitting: Internal Medicine

## 2011-12-06 DIAGNOSIS — L0390 Cellulitis, unspecified: Secondary | ICD-10-CM | POA: Insufficient documentation

## 2011-12-06 NOTE — Progress Notes (Signed)
Subjective:    Patient ID: Omar Benson, male    DOB: 1934/05/21, 77 y.o.   MRN: [id number]  HPI  Here with acute onset 2-3 days red, tender, swelling to left lower quad abdomen area after tick bite with low grade temp but no chills, nothing makes better or worse, overall mild.  Has mult bruising to arms but no other bruises other locations or mucous membranes on the plavix, not painful but just doesn't like it.  No other overt blood loss.  Has significant ED symptoms which is frustrating now that he is on imdur and non-candidate for viagra type meds and not interested in other device or tx.  Pt denies chest pain, increased sob or doe, wheezing, orthopnea, PND, increased LE swelling, palpitations, dizziness or syncope. Pt denies new neurological symptoms such as new headache, or facial or extremity weakness or numbness.  Pt denies polydipsia, polyuria. Past Medical History  Diagnosis Date  . Hypothyroidism 12/05/2008  . HYPERLIPIDEMIA 09/19/2008  . GOUT 09/19/2008  . ANEMIA-IRON DEFICIENCY 09/19/2008  . HYPERTENSION 09/19/2008  . CAD 12/05/2008    s/p CABG; NSTEMI 05/2011 - LHC 05/25/11: LAD occluded, LIMA-LAD patent, ostial circumflex 50%, AV circumflex 70%, SVG-OM2 with extensive disease with thrombus (culprit vessel), RCA 80% and occluded, SVG-intermediate patent, SVG-PDA/PL A patent.  Given that the culprit vessel was a subtotally occluded heavily thrombotic graft to a smaller OM2, medical therapy was recommended.;   . Atrial fibrillation 04/19/2008    amiodarone rx;  Echocardiogram 05/26/11: No wall motion abnormalities, mild LVH, EF 60%.  . Atrial flutter 12/05/2008    s/p RFCA  . BRADYCARDIA 12/05/2008  . AAA 09/19/2008  . PERIPHERAL VASCULAR DISEASE 09/19/2008  . GERD 09/19/2008  . DIVERTICULOSIS, COLON 09/19/2008  . RENAL INSUFFICIENCY 09/19/2008  . LOW BACK PAIN 09/19/2008  . PSA, INCREASED 09/19/2008  . COLONIC POLYPS, HX OF 09/19/2008  . GASTROINTESTINAL HEMORRHAGE, HX OF 04/19/2008  . NEPHROLITHIASIS, HX OF  01/11/2009  . CORONARY ARTERY BYPASS GRAFT, HX OF 12/05/2008    A. LIMA-LAD, VG-RI, VG-OM2, VG-RPD/RPL;  B. 05/2011 - NSTEMI - CATH WITH 4/5 PATENT GRAFTS AND NEW THROMBUS IN DISTAL VG-OM2 - MED RX  . ANXIETY 06/13/2010  . Arthritis   . COPD (chronic obstructive pulmonary disease)    Past Surgical History  Procedure Date  . Aorto-femoral bypass x5 - stress test neg 9/08   . Coronary artery bypass graft   . S/p afib ablation   . Cardiac catheterization 05/25/2011    reports that he quit smoking about 28 years ago. He does not have any smokeless tobacco history on file. He reports that he drinks alcohol. He reports that he does not use illicit drugs. family history includes Diabetes in his father and sister; Heart disease in his maternal uncle and paternal uncle; Lymphoma in his sister; and Multiple myeloma in his mother. Allergies  Allergen Reactions  . Doxycycline Hyclate     esophagitis  . Lipitor (Atorvastatin Calcium) Other (See Comments)    Myalgia/myopathy  . Promethazine Hcl Other (See Comments)    "puts him out"  . Promethazine Hcl   . Tetracycline Other (See Comments)    Esophagitis  . Tetanus Toxoid Rash   Current Outpatient Prescriptions on File Prior to Visit  Medication Sig Dispense Refill  . allopurinol (ZYLOPRIM) 300 MG tablet Take 300 mg by mouth daily.        Marland Kitchen amiodarone (PACERONE) 100 MG tablet Take 100 mg by mouth daily.        Marland Kitchen  amiodarone (PACERONE) 200 MG tablet TAKE 1/2 TABLET ONCE DAILY  15 tablet  6  . aspirin EC 81 MG tablet Take 162 mg by mouth daily.       . clopidogrel (PLAVIX) 75 MG tablet Take 75 mg by mouth daily.      . isosorbide mononitrate (IMDUR) 30 MG 24 hr tablet Take 1 tablet (30 mg total) by mouth daily.  30 tablet  11  . levothyroxine (SYNTHROID, LEVOTHROID) 100 MCG tablet Take 100 mcg by mouth daily.        . Multiple Vitamin (MULTIVITAMIN) tablet Take 1 tablet by mouth daily.      . nitroGLYCERIN (NITROSTAT) 0.4 MG SL tablet Place 1 tablet  (0.4 mg total) under the tongue every 5 (five) minutes as needed for chest pain.  25 tablet  3  . pantoprazole (PROTONIX) 40 MG tablet Take 40 mg by mouth daily.      . rosuvastatin (CRESTOR) 10 MG tablet Take 10 mg by mouth daily.      Marland Kitchen SPIRIVA HANDIHALER 18 MCG inhalation capsule PLACE 1 CAPSULE (18 MCG. TOTAL) INTO INHALER AND INHALE DAILY.  30 each  3  . tiotropium (SPIRIVA) 18 MCG inhalation capsule Place 18 mcg into inhaler and inhale daily.         Review of Systems Review of Systems  Constitutional: Negative for diaphoresis and unexpected weight change.  HENT: Negative for tinnitus.   Eyes: Negative for visual disturbance.  Respiratory: Negative for choking and stridor.   Gastrointestinal: Negative for vomiting and blood in stool.  Genitourinary: Negative for hematuria and decreased urine volume.  Musculoskeletal: Negative for gait problem.  Skin: Negative for wound.  Neurological: Negative for tremors and numbness.   Objective:   Physical Exam BP 124/70  Pulse 54  Temp 97.5 F (36.4 C) (Oral)  Ht 5\' 10"  (1.778 m)  Wt 175 lb 4 oz (79.493 kg)  BMI 25.15 kg/m2  SpO2 96% Physical Exam  VS noted, not ill appearing Constitutional: Pt appears well-developed and well-nourished.  HENT: Head: Normocephalic.  Right Ear: External ear normal.  Left Ear: External ear normal.  Eyes: Conjunctivae and EOM are normal. Pupils are equal, round, and reactive to light.  Neck: Normal range of motion. Neck supple.  Cardiovascular: Normal rate and regular rhythm.   Pulmonary/Chest: Effort normal and breath sounds normal.  Abd:  Soft, NT, non-distended, + BS but with LLQ with 2 cm area mild red, tender, swelling with prob tick bite at center, no fluctuance or drainage Neurological: Pt is alert.  Psychiatric: Pt behavior is normal. Thought content normal.    Assessment & Plan:

## 2011-12-06 NOTE — Assessment & Plan Note (Signed)
stable overall by hx and exam, most recent data reviewed with pt, and pt to continue medical treatment as before SpO2 Readings from Last 3 Encounters:  12/02/11 96%  07/23/11 95%  06/03/11 95%

## 2011-12-06 NOTE — Assessment & Plan Note (Signed)
Mild to mod, for antibx course,  to f/u any worsening symptoms or concerns 

## 2011-12-06 NOTE — Assessment & Plan Note (Signed)
stable overall by hx and exam, most recent data reviewed with pt, and pt to continue medical treatment as before BP Readings from Last 3 Encounters:  12/02/11 124/70  09/02/11 122/68  08/30/11 134/56

## 2011-12-07 ENCOUNTER — Telehealth: Payer: Self-pay | Admitting: Internal Medicine

## 2011-12-07 DIAGNOSIS — I739 Peripheral vascular disease, unspecified: Secondary | ICD-10-CM

## 2011-12-07 NOTE — Telephone Encounter (Signed)
Message copied by Corwin Levins on Mon Dec 07, 2011 12:25 PM ------      Message from: Scharlene Gloss B      Created: Mon Dec 07, 2011  8:56 AM       Patient informed of results and has had some discomfort in that area would like referral to vascular surgery.

## 2011-12-07 NOTE — Telephone Encounter (Signed)
Done per emr 

## 2011-12-10 ENCOUNTER — Other Ambulatory Visit: Payer: Self-pay

## 2011-12-10 DIAGNOSIS — I739 Peripheral vascular disease, unspecified: Secondary | ICD-10-CM

## 2011-12-22 ENCOUNTER — Encounter: Payer: Self-pay | Admitting: Internal Medicine

## 2011-12-22 ENCOUNTER — Ambulatory Visit (INDEPENDENT_AMBULATORY_CARE_PROVIDER_SITE_OTHER): Payer: Medicare Other | Admitting: Internal Medicine

## 2011-12-22 ENCOUNTER — Other Ambulatory Visit (INDEPENDENT_AMBULATORY_CARE_PROVIDER_SITE_OTHER): Payer: Medicare Other

## 2011-12-22 VITALS — BP 110/68 | HR 51 | Temp 97.4°F | Ht 70.0 in | Wt 173.0 lb

## 2011-12-22 DIAGNOSIS — Z125 Encounter for screening for malignant neoplasm of prostate: Secondary | ICD-10-CM

## 2011-12-22 DIAGNOSIS — Z0000 Encounter for general adult medical examination without abnormal findings: Secondary | ICD-10-CM

## 2011-12-22 DIAGNOSIS — Z79899 Other long term (current) drug therapy: Secondary | ICD-10-CM

## 2011-12-22 LAB — CBC WITH DIFFERENTIAL/PLATELET
Basophils Absolute: 0 10*3/uL (ref 0.0–0.1)
Hemoglobin: 12.7 g/dL — ABNORMAL LOW (ref 13.0–17.0)
Lymphocytes Relative: 25.6 % (ref 12.0–46.0)
Monocytes Relative: 11.2 % (ref 3.0–12.0)
Platelets: 155 10*3/uL (ref 150.0–400.0)
RDW: 14.1 % (ref 11.5–14.6)

## 2011-12-22 LAB — TSH: TSH: 2.07 u[IU]/mL (ref 0.35–5.50)

## 2011-12-22 LAB — URINALYSIS, ROUTINE W REFLEX MICROSCOPIC
Hgb urine dipstick: NEGATIVE
Nitrite: NEGATIVE
Total Protein, Urine: NEGATIVE
Urobilinogen, UA: 0.2 (ref 0.0–1.0)

## 2011-12-22 LAB — HEPATIC FUNCTION PANEL
AST: 24 U/L (ref 0–37)
Alkaline Phosphatase: 86 U/L (ref 39–117)
Bilirubin, Direct: 0.2 mg/dL (ref 0.0–0.3)
Total Bilirubin: 1.1 mg/dL (ref 0.3–1.2)

## 2011-12-22 LAB — PSA: PSA: 1.36 ng/mL (ref 0.10–4.00)

## 2011-12-22 LAB — LIPID PANEL
LDL Cholesterol: 39 mg/dL (ref 0–99)
VLDL: 17.8 mg/dL (ref 0.0–40.0)

## 2011-12-22 MED ORDER — NITROGLYCERIN 0.4 MG SL SUBL: 0.4000 mg | SUBLINGUAL_TABLET | SUBLINGUAL | Status: DC | PRN

## 2011-12-22 NOTE — Assessment & Plan Note (Signed)
Overall doing well, age appropriate education and counseling updated, referrals for preventative services and immunizations addressed, dietary and smoking counseling addressed, most recent labs and ECG reviewed.  I have personally reviewed and have noted: 1) the patient's medical and social history 2) The pt's use of alcohol, tobacco, and illicit drugs 3) The patient's current medications and supplements 4) Functional ability including ADL's, fall risk, home safety risk, hearing and visual impairment 5) Diet and physical activities 6) Evidence for depression or mood disorder 7) The patient's height, weight, and BMI have been recorded in the chart I have made referrals, and provided counseling and education based on review of the above Decliens immunizaations today, o/w up to date

## 2011-12-22 NOTE — Progress Notes (Signed)
Subjective:    Patient ID: Omar Benson, male    DOB: [birth year], 77 y.o.   MRN: [id number]  HPI  Here for wellness and f/u;  Overall doing ok;  Pt denies CP, worsening SOB, DOE, wheezing, orthopnea, PND, worsening LE edema, palpitations, dizziness or syncope.  Pt denies neurological change such as new Headache, facial or extremity weakness.  Pt denies polydipsia, polyuria, or low sugar symptoms. Pt states overall good compliance with treatment and medications, good tolerability, and trying to follow lower cholesterol diet.  Pt denies worsening depressive symptoms, suicidal ideation or panic. No fever, wt loss, night sweats, loss of appetite, or other constitutional symptoms.  Pt states good ability with ADL's, low fall risk, home safety reviewed and adequate, no significant changes in hearing or vision, and occasionally active with exercise.  Due to see Dr Arbie Cookey for Right iliac athersclerosis next wk.  Does have ongoing constipation with diarrhea resulting about once per wk, pain to start, and last colon mar 2012.  Had less issue when eating bran, but eats less lately trying to get away from it.  Is former smoker Past Medical History  Diagnosis Date  . Hypothyroidism 12/05/2008  . HYPERLIPIDEMIA 09/19/2008  . GOUT 09/19/2008  . ANEMIA-IRON DEFICIENCY 09/19/2008  . HYPERTENSION 09/19/2008  . CAD 12/05/2008    s/p CABG; NSTEMI 05/2011 - LHC 05/25/11: LAD occluded, LIMA-LAD patent, ostial circumflex 50%, AV circumflex 70%, SVG-OM2 with extensive disease with thrombus (culprit vessel), RCA 80% and occluded, SVG-intermediate patent, SVG-PDA/PL A patent.  Given that the culprit vessel was a subtotally occluded heavily thrombotic graft to a smaller OM2, medical therapy was recommended.;   . Atrial fibrillation 04/19/2008    amiodarone rx;  Echocardiogram 05/26/11: No wall motion abnormalities, mild LVH, EF 60%.  . Atrial flutter 12/05/2008    s/p RFCA  . BRADYCARDIA 12/05/2008  . AAA 09/19/2008  . PERIPHERAL VASCULAR  DISEASE 09/19/2008  . GERD 09/19/2008  . DIVERTICULOSIS, COLON 09/19/2008  . RENAL INSUFFICIENCY 09/19/2008  . LOW BACK PAIN 09/19/2008  . PSA, INCREASED 09/19/2008  . COLONIC POLYPS, HX OF 09/19/2008  . GASTROINTESTINAL HEMORRHAGE, HX OF 04/19/2008  . NEPHROLITHIASIS, HX OF 01/11/2009  . CORONARY ARTERY BYPASS GRAFT, HX OF 12/05/2008    A. LIMA-LAD, VG-RI, VG-OM2, VG-RPD/RPL;  B. 05/2011 - NSTEMI - CATH WITH 4/5 PATENT GRAFTS AND NEW THROMBUS IN DISTAL VG-OM2 - MED RX  . ANXIETY 06/13/2010  . Arthritis   . COPD (chronic obstructive pulmonary disease)    Past Surgical History  Procedure Date  . Aorto-femoral bypass x5 - stress test neg 9/08   . Coronary artery bypass graft   . S/p afib ablation   . Cardiac catheterization 05/25/2011    reports that he quit smoking about 28 years ago. He does not have any smokeless tobacco history on file. He reports that he drinks alcohol. He reports that he does not use illicit drugs. family history includes Diabetes in his father and sister; Heart disease in his maternal uncle and paternal uncle; Lymphoma in his sister; and Multiple myeloma in his mother. Allergies  Allergen Reactions  . Doxycycline Hyclate     esophagitis  . Lipitor (Atorvastatin Calcium) Other (See Comments)    Myalgia/myopathy  . Promethazine Hcl Other (See Comments)    "puts him out"  . Promethazine Hcl   . Tetracycline Other (See Comments)    Esophagitis  . Tetanus Toxoid Rash   Current Outpatient Prescriptions on File Prior to Visit  Medication Sig  Dispense Refill  . allopurinol (ZYLOPRIM) 300 MG tablet Take 300 mg by mouth daily.        Marland Kitchen amiodarone (PACERONE) 100 MG tablet Take 100 mg by mouth daily.        Marland Kitchen amiodarone (PACERONE) 200 MG tablet TAKE 1/2 TABLET ONCE DAILY  15 tablet  6  . aspirin EC 81 MG tablet Take 162 mg by mouth daily.       . clopidogrel (PLAVIX) 75 MG tablet Take 75 mg by mouth daily.      . isosorbide mononitrate (IMDUR) 30 MG 24 hr tablet Take 1 tablet (30 mg  total) by mouth daily.  30 tablet  11  . levothyroxine (SYNTHROID, LEVOTHROID) 100 MCG tablet Take 100 mcg by mouth daily.        . Multiple Vitamin (MULTIVITAMIN) tablet Take 1 tablet by mouth daily.      . nitroGLYCERIN (NITROSTAT) 0.4 MG SL tablet Place 1 tablet (0.4 mg total) under the tongue every 5 (five) minutes as needed for chest pain.  25 tablet  3  . pantoprazole (PROTONIX) 40 MG tablet Take 40 mg by mouth daily.      . rosuvastatin (CRESTOR) 10 MG tablet Take 10 mg by mouth daily.      Marland Kitchen SPIRIVA HANDIHALER 18 MCG inhalation capsule PLACE 1 CAPSULE (18 MCG. TOTAL) INTO INHALER AND INHALE DAILY.  30 each  3  . tiotropium (SPIRIVA) 18 MCG inhalation capsule Place 18 mcg into inhaler and inhale daily.        Marland Kitchen triamcinolone cream (KENALOG) 0.1 % Apply topically 2 (two) times daily.  30 g  0   Review of Systems Review of Systems  Constitutional: Negative for diaphoresis, activity change, appetite change and unexpected weight change.  HENT: Negative for hearing loss, ear pain, facial swelling, mouth sores and neck stiffness.   Eyes: Negative for pain, redness and visual disturbance.  Respiratory: Negative for shortness of breath and wheezing.   Cardiovascular: Negative for chest pain and palpitations.  Gastrointestinal: Negative for diarrhea, blood in stool, abdominal distention and rectal pain.  Genitourinary: Negative for hematuria, flank pain and decreased urine volume.  Musculoskeletal: Negative for myalgias and joint swelling.  Skin: Negative for color change and wound.  Neurological: Negative for syncope and numbness.  Hematological: Negative for adenopathy.  Psychiatric/Behavioral: Negative for hallucinations, self-injury, decreased concentration and agitation.     Objective:   Physical Exam BP 110/68  Pulse 51  Temp 97.4 F (36.3 C) (Oral)  Ht 5\' 10"  (1.778 m)  Wt 173 lb (78.472 kg)  BMI 24.82 kg/m2  SpO2 96% Physical Exam  VS noted Constitutional: Pt is oriented to  person, place, and time. Appears well-developed and well-nourished.  HENT:  Head: Normocephalic and atraumatic.  Right Ear: External ear normal.  Left Ear: External ear normal.  Nose: Nose normal.  Mouth/Throat: Oropharynx is clear and moist.  Eyes: Conjunctivae and EOM are normal. Pupils are equal, round, and reactive to light.  Neck: Normal range of motion. Neck supple. No JVD present. No tracheal deviation present.  Cardiovascular: Normal rate, regular rhythm, normal heart sounds and intact distal pulses.   Pulmonary/Chest: Effort normal and breath sounds normal.  Abdominal: Soft. Bowel sounds are normal. There is no tenderness.  Musculoskeletal: Normal range of motion. Exhibits no edema.  Lymphadenopathy:  Has no cervical adenopathy.  Neurological: Pt is alert and oriented to person, place, and time. Pt has normal reflexes. No cranial nerve deficit.  Skin: Skin is  warm and dry. No rash noted.  Psychiatric:  Has  normal mood and affect. Behavior is normal.     Assessment & Plan:

## 2011-12-22 NOTE — Patient Instructions (Signed)
Continue all other medications as before Please have the pharmacy call with any refills you may need. Please go to LAB in the Basement for the blood and/or urine tests to be done today You will be contacted by phone if any changes need to be made immediately.  Otherwise, you will receive a letter about your results with an explanation. Please return in 1 year for your yearly visit, or sooner if needed, with Lab testing done 3-5 days before  

## 2011-12-24 ENCOUNTER — Other Ambulatory Visit: Payer: Self-pay | Admitting: Nurse Practitioner

## 2011-12-28 ENCOUNTER — Encounter: Payer: Self-pay | Admitting: Vascular Surgery

## 2011-12-29 ENCOUNTER — Ambulatory Visit (INDEPENDENT_AMBULATORY_CARE_PROVIDER_SITE_OTHER): Payer: Medicare Other | Admitting: Vascular Surgery

## 2011-12-29 ENCOUNTER — Encounter: Payer: Self-pay | Admitting: Vascular Surgery

## 2011-12-29 VITALS — BP 173/77 | HR 49 | Resp 16 | Ht 70.0 in | Wt 171.0 lb

## 2011-12-29 DIAGNOSIS — I739 Peripheral vascular disease, unspecified: Secondary | ICD-10-CM

## 2011-12-29 DIAGNOSIS — I714 Abdominal aortic aneurysm, without rupture: Secondary | ICD-10-CM

## 2011-12-29 NOTE — Progress Notes (Signed)
[facility] [place]   Patient name: Omar Benson MRN: [id number] DOB: 13-Jul-1934 Sex: male   Referred by: Dr. Jonny Ruiz  Reason for referral:  Chief Complaint  Patient presents with  . New Evaluation    rt iliac artery blockage/ Dr. Oliver Barre    HISTORY OF PRESENT ILLNESS: Patient presents today for discussion regarding right common iliac artery stenosis and small infrarenal abdominal aortic aneurysm. This is been followed as an outpatient with ultrasound for a number of years. He has had a stable measurement in his aorta with size in the 2.7-2.9 range. I do had his last several reports for review. He also underwent duplex evaluation of his iliac arteries at the same time of his ultrasound of his aorta. This most recent study revealed elevated velocities in his right common iliac artery. He is seen today for further discussion of this. He does report some bilateral fatigue in his walking and reports that he felt this was due to "old age" he is stable from a cardiac standpoint.  Past Medical History  Diagnosis Date  . Hypothyroidism 12/05/2008  . HYPERLIPIDEMIA 09/19/2008  . GOUT 09/19/2008  . ANEMIA-IRON DEFICIENCY 09/19/2008  . HYPERTENSION 09/19/2008  . CAD 12/05/2008    s/p CABG; NSTEMI 05/2011 - LHC 05/25/11: LAD occluded, LIMA-LAD patent, ostial circumflex 50%, AV circumflex 70%, SVG-OM2 with extensive disease with thrombus (culprit vessel), RCA 80% and occluded, SVG-intermediate patent, SVG-PDA/PL A patent.  Given that the culprit vessel was a subtotally occluded heavily thrombotic graft to a smaller OM2, medical therapy was recommended.;   . Atrial fibrillation 04/19/2008    amiodarone rx;  Echocardiogram 05/26/11: No wall motion abnormalities, mild LVH, EF 60%.  . Atrial flutter 12/05/2008    s/p RFCA  . BRADYCARDIA 12/05/2008  . AAA 09/19/2008  . PERIPHERAL VASCULAR DISEASE 09/19/2008  . GERD 09/19/2008  . DIVERTICULOSIS, COLON 09/19/2008  . RENAL INSUFFICIENCY 09/19/2008  . LOW  BACK PAIN 09/19/2008  . PSA, INCREASED 09/19/2008  . COLONIC POLYPS, HX OF 09/19/2008  . GASTROINTESTINAL HEMORRHAGE, HX OF 04/19/2008  . NEPHROLITHIASIS, HX OF 01/11/2009  . CORONARY ARTERY BYPASS GRAFT, HX OF 12/05/2008    A. LIMA-LAD, VG-RI, VG-OM2, VG-RPD/RPL;  B. 05/2011 - NSTEMI - CATH WITH 4/5 PATENT GRAFTS AND NEW THROMBUS IN DISTAL VG-OM2 - MED RX  . ANXIETY 06/13/2010  . Arthritis   . COPD (chronic obstructive pulmonary disease)   . Myocardial infarction     Past Surgical History  Procedure Date  . Aorto-femoral bypass x5 - stress test neg 9/08   . Coronary artery bypass graft   . S/p afib ablation   . Cardiac catheterization 05/25/2011    History   Social History  . Marital Status: Married    Spouse Name: Omar Benson    Number of Children: N/A  . Years of Education: N/A   Occupational History  . self employeed - Contractor    Social History Main Topics  . Smoking status: Former Smoker -- 4.0 packs/day for 31 years    Types: Cigarettes    Quit date: 03/19/1983  . Smokeless tobacco: Never Used  . Alcohol Use: 6.0 oz/week    10 Shots of liquor per week  . Drug Use: No  . Sexually Active: Yes   Other Topics Concern  . Not on file   Social History Narrative   Married lives with his wife    Family History  Problem Relation Age of Onset  . Diabetes Father   .  Diabetes Sister   . Multiple myeloma Mother   . Cancer Mother   . Other Mother     varicose veins  . Heart disease Paternal Uncle   . Heart disease Maternal Uncle   . Lymphoma Sister     half-sister    Allergies as of 12/29/2011 - Review Complete 12/29/2011  Allergen Reaction Noted  . Doxycycline hyclate  07/30/2005  . Lipitor (atorvastatin calcium) Other (See Comments) 05/27/2011  . Promethazine hcl Other (See Comments)   . Promethazine hcl  09/02/2011  . Tetracycline Other (See Comments) 04/25/2008  . Tetanus toxoid Rash 04/25/2008    Current Outpatient Prescriptions on File Prior to Visit    Medication Sig Dispense Refill  . allopurinol (ZYLOPRIM) 300 MG tablet Take 300 mg by mouth daily.        Marland Kitchen amiodarone (PACERONE) 100 MG tablet Take 100 mg by mouth daily.        Marland Kitchen amiodarone (PACERONE) 200 MG tablet TAKE 1/2 TABLET ONCE DAILY  15 tablet  6  . aspirin EC 81 MG tablet Take 162 mg by mouth daily.       . clopidogrel (PLAVIX) 75 MG tablet TAKE ONE TABLET BY MOUTH ONE TIME DAILY WITH BREAKFAST.  30 tablet  12  . CRESTOR 10 MG tablet TAKE ONE TABLET BY MOUTH ONE TIME DAILY  30 each  12  . isosorbide mononitrate (IMDUR) 30 MG 24 hr tablet Take 1 tablet (30 mg total) by mouth daily.  30 tablet  11  . levothyroxine (SYNTHROID, LEVOTHROID) 100 MCG tablet Take 100 mcg by mouth daily.        . Multiple Vitamin (MULTIVITAMIN) tablet Take 1 tablet by mouth daily.      . nitroGLYCERIN (NITROSTAT) 0.4 MG SL tablet Place 1 tablet (0.4 mg total) under the tongue every 5 (five) minutes as needed for chest pain.  25 tablet  3  . pantoprazole (PROTONIX) 40 MG tablet TAKE ONE TABLET BY MOUTH ONE TIME DAILY AT NOON.  30 tablet  12  . SPIRIVA HANDIHALER 18 MCG inhalation capsule PLACE 1 CAPSULE (18 MCG. TOTAL) INTO INHALER AND INHALE DAILY.  30 each  3  . tiotropium (SPIRIVA) 18 MCG inhalation capsule Place 18 mcg into inhaler and inhale daily.        Marland Kitchen triamcinolone cream (KENALOG) 0.1 % Apply topically 2 (two) times daily.  30 g  0     REVIEW OF SYSTEMS:  Positives indicated with an "X"  CARDIOVASCULAR:  [ ]  chest pain   [ ]  chest pressure   [ ]  palpitations   [ ]  orthopnea   [ ]  dyspnea on exertion   [ ]  claudication   [ ]  rest pain   [ ]  DVT   [ ]  phlebitis PULMONARY:   [ ]  productive cough   [ ]  asthma   [ ]  wheezing NEUROLOGIC:   [ ]  weakness  [ ]  paresthesias  [ ]  aphasia  [ ]  amaurosis  [ ]  dizziness HEMATOLOGIC:   [ ]  bleeding problems   [ ]  clotting disorders MUSCULOSKELETAL:  [ ]  joint pain   [ ]  joint swelling GASTROINTESTINAL: [ ]   blood in stool  [ ]   hematemesis GENITOURINARY:   [ ]   dysuria  [ ]   hematuria PSYCHIATRIC:  [ ]  history of major depression INTEGUMENTARY:  [ ]  rashes  [ ]  ulcers CONSTITUTIONAL:  [ ]  fever   [ ]  chills  PHYSICAL EXAMINATION:  General: The patient is  a well-nourished male, in no acute distress. Vital signs are BP 173/77  Pulse 49  Resp 16  Ht 5\' 10"  (1.778 m)  Wt 171 lb (77.565 kg)  BMI 24.54 kg/m2  SpO2 100% Pulmonary: There is a good air exchange bilaterally without wheezing or rales. Abdomen: Soft and non-tender with normal pitch bowel sounds. He is thin and does have prominent aortic pulsation with no tenderness Musculoskeletal: There are no major deformities.  There is no significant extremity pain. Neurologic: No focal weakness or paresthesias are detected, Skin: There are no ulcer or rashes noted. Psychiatric: The patient has normal affect. Cardiovascular: There is a regular rate and rhythm without significant murmur appreciated. Pulse status: 2+ radial 2+ femoral 2+ popliteal and 2+ posterior tibial pulses bilaterally  [facility] Vascular Lab Studies:  Ordered and Independently Reviewed ankle arm index normal bilaterally with normal triphasic waveforms in the posterior tibial and dorsalis pedis bilaterally  Impression and Plan:  A small infrarenal abdominal aortic aneurysm versus ectasia is been stable over several years. I discussed this with the patient and recommended continued yearly followup and Dr. Raphael Gibney office. He does not have any claudication type symptoms in his right leg and therefore I explained we would not recommend any treatment for his right iliac artery narrowing. Did explain symptoms of right leg claudication U. notify us should this occur. I spent this is not limiting he should continue his very active exercise program that he is currently involved with. He was reassured this discussion will see [place] again on an as-needed basis    Omar Benson [facility] Office:  [phone]

## 2011-12-29 NOTE — Progress Notes (Signed)
Ankle brachial index performed @ [facility] 12/29/2011. 

## 2011-12-30 NOTE — Progress Notes (Signed)
Pt  completed cardiac rehab attending 19/36 exercise and 0 education sessions. Pt had good participation when attended sessions.   Pt VSS, Telemetry-NSR.  Pt METS increased from 2.4 at baseline to 5.0 upon completion of program. Pt plans to exercise on his own and did request information about cardiac maintenance program.   Pt did not have hospital admission during cardiac rehab period.  Pt has made positive lifestyle changes and should be commended for his efforts.  Pt was a pleasure to work with.  Thank you for the referral.

## 2012-01-11 ENCOUNTER — Ambulatory Visit (INDEPENDENT_AMBULATORY_CARE_PROVIDER_SITE_OTHER): Payer: Medicare Other | Admitting: Pulmonary Disease

## 2012-01-11 ENCOUNTER — Other Ambulatory Visit: Payer: Self-pay | Admitting: Internal Medicine

## 2012-01-11 ENCOUNTER — Encounter: Payer: Self-pay | Admitting: Pulmonary Disease

## 2012-01-11 VITALS — BP 102/68 | HR 51 | Temp 98.1°F | Ht 70.0 in | Wt 171.6 lb

## 2012-01-11 DIAGNOSIS — J449 Chronic obstructive pulmonary disease, unspecified: Secondary | ICD-10-CM

## 2012-01-11 MED ORDER — TIOTROPIUM BROMIDE MONOHYDRATE 18 MCG IN CAPS: 18.0000 ug | ORAL_CAPSULE | Freq: Every day | RESPIRATORY_TRACT | Status: DC

## 2012-01-11 NOTE — Assessment & Plan Note (Signed)
The patient is doing well from a COPD standpoint on Spiriva alone.  He feels that it continues to help his breathing.  He is staying very active, and is satisfied with his exertional tolerance.  I have asked him to continue his current medication, and followup with me in one year.

## 2012-01-11 NOTE — Patient Instructions (Addendum)
Stay on spiriva, and will send in a new prescription for the medication. Stay active followup with me in one year.

## 2012-01-11 NOTE — Progress Notes (Signed)
  Subjective:    Patient ID: Omar Benson, male    DOB: [birth year], 77 y.o.   MRN: [id number]  HPI Patient comes in today for followup of his COPD.  He has been doing very well on Spiriva, and feels that his exertional tolerance is at a good level, and unchanged from last visit.  He denies any recent acute exacerbation or pulmonary infection.  He has not had any significant cough, congestion, mucus production.   Review of Systems  Constitutional: Negative for fever and unexpected weight change.  HENT: Positive for nosebleeds and sneezing. Negative for ear pain, congestion, sore throat, rhinorrhea, trouble swallowing, dental problem, postnasal drip and sinus pressure.   Eyes: Negative for redness and itching.  Respiratory: Positive for cough. Negative for chest tightness, shortness of breath and wheezing.   Cardiovascular: Negative for palpitations and leg swelling.  Gastrointestinal: Negative for nausea and vomiting.  Genitourinary: Negative for dysuria.  Musculoskeletal: Negative for joint swelling.  Skin: Negative for rash.  Neurological: Negative for headaches.  Hematological: Bruises/bleeds easily.  Psychiatric/Behavioral: Negative for dysphoric mood. The patient is not nervous/anxious.        Objective:   Physical Exam Thin male in no acute distress nose without purulence or discharge noted Chest with mild decrease in breath sounds, no wheezing Cardiac exam with regular rate and rhythm Lower extremities without edema, no cyanosis alert oriented, moves all 4 extremities.       Assessment & Plan:

## 2012-03-01 ENCOUNTER — Ambulatory Visit (INDEPENDENT_AMBULATORY_CARE_PROVIDER_SITE_OTHER): Payer: Medicare Other | Admitting: Internal Medicine

## 2012-03-01 ENCOUNTER — Encounter: Payer: Self-pay | Admitting: Internal Medicine

## 2012-03-01 VITALS — BP 120/68 | HR 58 | Ht 70.0 in | Wt 173.6 lb

## 2012-03-01 DIAGNOSIS — I4891 Unspecified atrial fibrillation: Secondary | ICD-10-CM

## 2012-03-01 MED ORDER — RIVAROXABAN 20 MG PO TABS: 20.0000 mg | ORAL_TABLET | Freq: Every day | ORAL | Status: DC

## 2012-03-01 MED ORDER — ASPIRIN EC 81 MG PO TBEC: 81.0000 mg | DELAYED_RELEASE_TABLET | Freq: Every day | ORAL | Status: DC

## 2012-03-01 NOTE — Assessment & Plan Note (Signed)
The patient appears to be maintaining sinus rhythm though I suspect he has episodes of paroxysmal A. fib which he is unaware of. I've recommended that he start anticoagulation. He is 77. Will start xarelto 20 mg daily.

## 2012-03-01 NOTE — Patient Instructions (Addendum)
Your physician wants you to follow-up in: 6 months with Dr Court Joy will receive a reminder letter in the mail two months in advance. If you don't receive a letter, please call our office to schedule the follow-up appointment.   Your physician has recommended you make the following change in your medication:  1) Start Xarelto 20mg  daily 2) Stop Plavix  3) Only take 81mg  ASA daily

## 2012-03-01 NOTE — Progress Notes (Signed)
HPI [name] returns today for followup.he is a 77 year old man with coronary artery disease, status post bypass surgery, atrial flutter, status post catheter ablation, paroxysmal atrial fibrillation, well controlled with amiodarone.in he has done well. 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 He has inquired about anticoagulation. Allergies  Allergen Reactions  . Doxycycline Hyclate     esophagitis  . Lipitor (Atorvastatin Calcium) Other (See Comments)    Myalgia/myopathy  . Promethazine Hcl Other (See Comments)    "puts him out"  . Promethazine Hcl   . Tetracycline Other (See Comments)    Esophagitis  . Tetanus Toxoid Rash     Current Outpatient Prescriptions  Medication Sig Dispense Refill  . allopurinol (ZYLOPRIM) 300 MG tablet Take 300 mg by mouth daily.        Marland Kitchen amiodarone (PACERONE) 100 MG tablet Take 100 mg by mouth daily.        Marland Kitchen aspirin EC 81 MG tablet Take 162 mg by mouth daily.       . clopidogrel (PLAVIX) 75 MG tablet TAKE ONE TABLET BY MOUTH ONE TIME DAILY WITH BREAKFAST.  30 tablet  12  . CRESTOR 10 MG tablet TAKE ONE TABLET BY MOUTH ONE TIME DAILY  30 each  12  . isosorbide mononitrate (IMDUR) 30 MG 24 hr tablet Take 1 tablet (30 mg total) by mouth daily.  30 tablet  11  . levothyroxine (SYNTHROID, LEVOTHROID) 100 MCG tablet Take 100 mcg by mouth daily.        . Multiple Vitamin (MULTIVITAMIN) tablet Take 1 tablet by mouth daily.      . nitroGLYCERIN (NITROSTAT) 0.4 MG SL tablet Place 1 tablet (0.4 mg total) under the tongue every 5 (five) minutes as needed for chest pain.  25 tablet  3  . pantoprazole (PROTONIX) 40 MG tablet TAKE ONE TABLET BY MOUTH ONE TIME DAILY AT NOON.  30 tablet  12  . tiotropium (SPIRIVA HANDIHALER) 18 MCG inhalation capsule Place 1 capsule (18 mcg total) into inhaler and inhale daily.  30 capsule  11  . DISCONTD: tiotropium (SPIRIVA) 18 MCG inhalation capsule Place 18 mcg into inhaler and inhale  daily.           Past Medical History  Diagnosis Date  . Hypothyroidism 12/05/2008  . HYPERLIPIDEMIA 09/19/2008  . GOUT 09/19/2008  . ANEMIA-IRON DEFICIENCY 09/19/2008  . HYPERTENSION 09/19/2008  . CAD 12/05/2008    s/p CABG; NSTEMI 05/2011 - LHC 05/25/11: LAD occluded, LIMA-LAD patent, ostial circumflex 50%, AV circumflex 70%, SVG-OM2 with extensive disease with thrombus (culprit vessel), RCA 80% and occluded, SVG-intermediate patent, SVG-PDA/PL A patent.  Given that the culprit vessel was a subtotally occluded heavily thrombotic graft to a smaller OM2, medical therapy was recommended.;   . Atrial fibrillation 04/19/2008    amiodarone rx;  Echocardiogram 05/26/11: No wall motion abnormalities, mild LVH, EF 60%.  . Atrial flutter 12/05/2008    s/p RFCA  . BRADYCARDIA 12/05/2008  . AAA 09/19/2008  . PERIPHERAL VASCULAR DISEASE 09/19/2008  . GERD 09/19/2008  . DIVERTICULOSIS, COLON 09/19/2008  . RENAL INSUFFICIENCY 09/19/2008  . LOW BACK PAIN 09/19/2008  . PSA, INCREASED 09/19/2008  . COLONIC POLYPS, HX OF 09/19/2008  . GASTROINTESTINAL HEMORRHAGE, HX OF 04/19/2008  . NEPHROLITHIASIS, HX OF 01/11/2009  . CORONARY ARTERY BYPASS GRAFT, HX OF 12/05/2008    A. LIMA-LAD, VG-RI, VG-OM2, VG-RPD/RPL;  B. 05/2011 - NSTEMI - CATH WITH 4/5 PATENT GRAFTS AND  NEW THROMBUS IN DISTAL VG-OM2 - MED RX  . ANXIETY 06/13/2010  . Arthritis   . COPD (chronic obstructive pulmonary disease)   . Myocardial infarction     ROS:   All systems reviewed and negative except as noted in the HPI.   Past Surgical History  Procedure Date  . Aorto-femoral bypass x5 - stress test neg 9/08   . Coronary artery bypass graft   . S/p afib ablation   . Cardiac catheterization 05/25/2011     Family History  Problem Relation Age of Onset  . Diabetes Father   . Diabetes Sister   . Multiple myeloma Mother   . Cancer Mother   . Other Mother     varicose veins  . Heart disease Paternal Uncle   . Heart disease Maternal Uncle   . Lymphoma Sister      half-sister     History   Social History  . Marital Status: Married    Spouse Name: Victorino Dike    Number of Children: N/A  . Years of Education: N/A   Occupational History  . self employeed - Contractor    Social History Main Topics  . Smoking status: Former Smoker -- 4.0 packs/day for 31 years    Types: Cigarettes    Quit date: 03/19/1983  . Smokeless tobacco: Never Used  . Alcohol Use: 6.0 oz/week    10 Shots of liquor per week  . Drug Use: No  . Sexually Active: Yes   Other Topics Concern  . Not on file   Social History Narrative   Married lives with his wife     BP 120/68  Pulse 58  Ht 5\' 10"  (1.778 m)  Wt 173 lb 9.6 oz (78.744 kg)  BMI 24.91 kg/m2  Physical Exam:  Well appearing 77 year old man,  NAD HEENT: Unremarkable Neck:  No JVD, no thyromegally Lungs:  Clear with no wheezes, rales, or rhonchi. HEART:  Regular rate rhythm, no murmurs, no rubs, no clicks Abd:  soft, positive bowel sounds, no organomegally, no rebound, no guarding Ext:  2 plus pulses, no edema, no cyanosis, no clubbing Skin:  No rashes no nodules Neuro:  CN II through XII intact, motor grossly intact  EKG sinus bradycardia with incomplete right bundle branch block and prior anterior MI   Assess/Plan:

## 2012-03-23 ENCOUNTER — Telehealth: Payer: Self-pay | Admitting: Internal Medicine

## 2012-03-23 NOTE — Telephone Encounter (Signed)
Called patient and spoke with patient  The Xarelto was going to cost his $149.00/month  He can not pay that much  The Plavix is $6.00/month

## 2012-03-23 NOTE — Telephone Encounter (Signed)
New Problem:    Patient called in today because his Rivaroxaban (XARELTO) 20 MG TABS is too expensive and he wanted to be prescribed a cheaper alternative.  Please call back.

## 2012-03-25 NOTE — Telephone Encounter (Signed)
Patient is going to start on Xarelto 20mg  as soon as his Plavix runs out  He will let me know if he needs samples

## 2012-04-12 ENCOUNTER — Other Ambulatory Visit: Payer: Self-pay

## 2012-04-12 MED ORDER — AMIODARONE HCL 100 MG PO TABS: 100.0000 mg | ORAL_TABLET | Freq: Every day | ORAL | Status: DC

## 2012-04-26 ENCOUNTER — Encounter (HOSPITAL_COMMUNITY): Payer: Self-pay | Admitting: Emergency Medicine

## 2012-04-26 ENCOUNTER — Ambulatory Visit (INDEPENDENT_AMBULATORY_CARE_PROVIDER_SITE_OTHER): Payer: Medicare Other | Admitting: Family Medicine

## 2012-04-26 ENCOUNTER — Observation Stay (HOSPITAL_COMMUNITY)
Admission: EM | Admit: 2012-04-26 | Discharge: 2012-04-29 | Disposition: A | Discharge status: Home / Self Care | Payer: Medicare Other | Attending: Internal Medicine | Admitting: Internal Medicine

## 2012-04-26 VITALS — BP 148/70 | HR 51 | Temp 97.5°F | Resp 18 | Ht 69.0 in | Wt 179.0 lb

## 2012-04-26 DIAGNOSIS — F411 Generalized anxiety disorder: Secondary | ICD-10-CM

## 2012-04-26 DIAGNOSIS — K922 Gastrointestinal hemorrhage, unspecified: Secondary | ICD-10-CM | POA: Diagnosis present

## 2012-04-26 DIAGNOSIS — R972 Elevated prostate specific antigen [PSA]: Secondary | ICD-10-CM

## 2012-04-26 DIAGNOSIS — L0390 Cellulitis, unspecified: Secondary | ICD-10-CM

## 2012-04-26 DIAGNOSIS — E785 Hyperlipidemia, unspecified: Secondary | ICD-10-CM | POA: Insufficient documentation

## 2012-04-26 DIAGNOSIS — N259 Disorder resulting from impaired renal tubular function, unspecified: Secondary | ICD-10-CM

## 2012-04-26 DIAGNOSIS — E039 Hypothyroidism, unspecified: Secondary | ICD-10-CM | POA: Insufficient documentation

## 2012-04-26 DIAGNOSIS — Z951 Presence of aortocoronary bypass graft: Secondary | ICD-10-CM | POA: Insufficient documentation

## 2012-04-26 DIAGNOSIS — J4489 Other specified chronic obstructive pulmonary disease: Secondary | ICD-10-CM | POA: Insufficient documentation

## 2012-04-26 DIAGNOSIS — I714 Abdominal aortic aneurysm, without rupture, unspecified: Secondary | ICD-10-CM | POA: Insufficient documentation

## 2012-04-26 DIAGNOSIS — I739 Peripheral vascular disease, unspecified: Secondary | ICD-10-CM

## 2012-04-26 DIAGNOSIS — R42 Dizziness and giddiness: Secondary | ICD-10-CM | POA: Insufficient documentation

## 2012-04-26 DIAGNOSIS — Z7901 Long term (current) use of anticoagulants: Secondary | ICD-10-CM | POA: Insufficient documentation

## 2012-04-26 DIAGNOSIS — Z8719 Personal history of other diseases of the digestive system: Secondary | ICD-10-CM

## 2012-04-26 DIAGNOSIS — I214 Non-ST elevation (NSTEMI) myocardial infarction: Secondary | ICD-10-CM

## 2012-04-26 DIAGNOSIS — I129 Hypertensive chronic kidney disease with stage 1 through stage 4 chronic kidney disease, or unspecified chronic kidney disease: Secondary | ICD-10-CM | POA: Insufficient documentation

## 2012-04-26 DIAGNOSIS — D509 Iron deficiency anemia, unspecified: Secondary | ICD-10-CM

## 2012-04-26 DIAGNOSIS — K5730 Diverticulosis of large intestine without perforation or abscess without bleeding: Secondary | ICD-10-CM

## 2012-04-26 DIAGNOSIS — I2510 Atherosclerotic heart disease of native coronary artery without angina pectoris: Secondary | ICD-10-CM | POA: Insufficient documentation

## 2012-04-26 DIAGNOSIS — D62 Acute posthemorrhagic anemia: Secondary | ICD-10-CM | POA: Insufficient documentation

## 2012-04-26 DIAGNOSIS — I498 Other specified cardiac arrhythmias: Secondary | ICD-10-CM

## 2012-04-26 DIAGNOSIS — M545 Low back pain: Secondary | ICD-10-CM

## 2012-04-26 DIAGNOSIS — R195 Other fecal abnormalities: Secondary | ICD-10-CM

## 2012-04-26 DIAGNOSIS — Z0000 Encounter for general adult medical examination without abnormal findings: Secondary | ICD-10-CM

## 2012-04-26 DIAGNOSIS — K2970 Gastritis, unspecified, without bleeding: Secondary | ICD-10-CM

## 2012-04-26 DIAGNOSIS — D649 Anemia, unspecified: Secondary | ICD-10-CM

## 2012-04-26 DIAGNOSIS — R1011 Right upper quadrant pain: Secondary | ICD-10-CM | POA: Insufficient documentation

## 2012-04-26 DIAGNOSIS — I480 Paroxysmal atrial fibrillation: Secondary | ICD-10-CM | POA: Diagnosis present

## 2012-04-26 DIAGNOSIS — Z79899 Other long term (current) drug therapy: Secondary | ICD-10-CM | POA: Insufficient documentation

## 2012-04-26 DIAGNOSIS — M109 Gout, unspecified: Secondary | ICD-10-CM

## 2012-04-26 DIAGNOSIS — Z87442 Personal history of urinary calculi: Secondary | ICD-10-CM

## 2012-04-26 DIAGNOSIS — K921 Melena: Principal | ICD-10-CM | POA: Insufficient documentation

## 2012-04-26 DIAGNOSIS — I4891 Unspecified atrial fibrillation: Secondary | ICD-10-CM | POA: Insufficient documentation

## 2012-04-26 DIAGNOSIS — J449 Chronic obstructive pulmonary disease, unspecified: Secondary | ICD-10-CM | POA: Insufficient documentation

## 2012-04-26 DIAGNOSIS — K222 Esophageal obstruction: Secondary | ICD-10-CM | POA: Insufficient documentation

## 2012-04-26 DIAGNOSIS — K5520 Angiodysplasia of colon without hemorrhage: Secondary | ICD-10-CM

## 2012-04-26 DIAGNOSIS — I10 Essential (primary) hypertension: Secondary | ICD-10-CM

## 2012-04-26 DIAGNOSIS — K219 Gastro-esophageal reflux disease without esophagitis: Secondary | ICD-10-CM

## 2012-04-26 DIAGNOSIS — Z86010 Personal history of colonic polyps: Secondary | ICD-10-CM

## 2012-04-26 DIAGNOSIS — N189 Chronic kidney disease, unspecified: Secondary | ICD-10-CM | POA: Insufficient documentation

## 2012-04-26 DIAGNOSIS — I4892 Unspecified atrial flutter: Secondary | ICD-10-CM

## 2012-04-26 HISTORY — DX: Esophageal obstruction: K22.2

## 2012-04-26 LAB — CBC
Hemoglobin: 8.4 g/dL — ABNORMAL LOW (ref 13.0–17.0)
Hemoglobin: 8.2 g/dL — ABNORMAL LOW (ref 13.0–17.0)
MCH: 31.2 pg (ref 26.0–34.0)
MCH: 31.8 pg (ref 26.0–34.0)
Platelets: 170 10*3/uL (ref 150–400)
RBC: 2.69 MIL/uL — ABNORMAL LOW (ref 4.22–5.81)
RBC: 2.58 MIL/uL — ABNORMAL LOW (ref 4.22–5.81)
WBC: 4.9 10*3/uL (ref 4.0–10.5)

## 2012-04-26 LAB — OCCULT BLOOD, POC DEVICE: Fecal Occult Bld: POSITIVE

## 2012-04-26 LAB — POCT CBC
HCT, POC: 29.3 % — AB (ref 43.5–53.7)
Hemoglobin: 8.7 g/dL — AB (ref 14.1–18.1)
Lymph, poc: 1.6 (ref 0.6–3.4)
MCHC: 29.7 g/dL — AB (ref 31.8–35.4)
MCV: 100 fL — AB (ref 80–97)
POC Granulocyte: 3.7 (ref 2–6.9)
WBC: 5.8 10*3/uL (ref 4.6–10.2)

## 2012-04-26 LAB — COMPREHENSIVE METABOLIC PANEL
ALT: 12 U/L (ref 0–53)
Alkaline Phosphatase: 86 U/L (ref 39–117)
CO2: 26 mEq/L (ref 19–32)
Calcium: 9.7 mg/dL (ref 8.4–10.5)
GFR calc Af Amer: 44 mL/min — ABNORMAL LOW (ref >90)
GFR calc non Af Amer: 38 mL/min — ABNORMAL LOW (ref >90)
Glucose, Bld: 119 mg/dL — ABNORMAL HIGH (ref 70–99)
Sodium: 140 mEq/L (ref 135–145)

## 2012-04-26 LAB — TYPE AND SCREEN: Antibody Screen: NEGATIVE

## 2012-04-26 LAB — IFOBT (OCCULT BLOOD): IFOBT: POSITIVE

## 2012-04-26 LAB — ABO/RH: ABO/RH(D): B POS

## 2012-04-26 NOTE — H&P (Signed)
Omar Benson is an 77 y.o. male.   Patient was seen and examined on April 26, 2012. PCP - Dr. Oliver Barre. Chief Complaint: Black stools and dizziness. HPI: 77 year old male with history of CAD status post CABG, atrial fibrillation on xarelto presents with complaints of having dizziness weakness and having black stools. Patient started noticing black stools yesterday and also had 2 episodes today with one episode been loose. Patient in addition has been feeling dizzy. Denies any abdominal pain nausea vomiting. Denies any chest pain shortness of breath loss of consciousness. In the ER patient was found to be anemic with hemoglobin around 8.7 a drop of 4 g of hemoglobin from August of this year. Patient has been admitted for further management. Patient takes xarelto and aspirin. Denies having used any NSAIDs. Patient has had a colonoscopy last year has had an EGD in 2009 which was showing esophageal strictures.  Past Medical History  Diagnosis Date  . Hypothyroidism 12/05/2008  . HYPERLIPIDEMIA 09/19/2008  . GOUT 09/19/2008  . ANEMIA-IRON DEFICIENCY 09/19/2008  . HYPERTENSION 09/19/2008  . CAD 12/05/2008    s/p CABG; NSTEMI 05/2011 - LHC 05/25/11: LAD occluded, LIMA-LAD patent, ostial circumflex 50%, AV circumflex 70%, SVG-OM2 with extensive disease with thrombus (culprit vessel), RCA 80% and occluded, SVG-intermediate patent, SVG-PDA/PL A patent.  Given that the culprit vessel was a subtotally occluded heavily thrombotic graft to a smaller OM2, medical therapy was recommended.;   . Atrial fibrillation 04/19/2008    amiodarone rx;  Echocardiogram 05/26/11: No wall motion abnormalities, mild LVH, EF 60%.  . Atrial flutter 12/05/2008    s/p RFCA  . BRADYCARDIA 12/05/2008  . AAA 09/19/2008  . PERIPHERAL VASCULAR DISEASE 09/19/2008  . GERD 09/19/2008  . DIVERTICULOSIS, COLON 09/19/2008  . RENAL INSUFFICIENCY 09/19/2008  . LOW BACK PAIN 09/19/2008  . PSA, INCREASED 09/19/2008  . COLONIC POLYPS, HX OF 09/19/2008  .  GASTROINTESTINAL HEMORRHAGE, HX OF 04/19/2008  . NEPHROLITHIASIS, HX OF 01/11/2009  . CORONARY ARTERY BYPASS GRAFT, HX OF 12/05/2008    A. LIMA-LAD, VG-RI, VG-OM2, VG-RPD/RPL;  B. 05/2011 - NSTEMI - CATH WITH 4/5 PATENT GRAFTS AND NEW THROMBUS IN DISTAL VG-OM2 - MED RX  . ANXIETY 06/13/2010  . Arthritis   . COPD (chronic obstructive pulmonary disease)   . Myocardial infarction   . Allergy     Past Surgical History  Procedure Date  . Aorto-femoral bypass x5 - stress test neg 9/08   . Coronary artery bypass graft   . S/p afib ablation   . Cardiac catheterization 05/25/2011  . Colon surgery     Family History  Problem Relation Age of Onset  . Diabetes Father   . Diabetes Sister   . Multiple myeloma Mother   . Cancer Mother   . Other Mother     varicose veins  . Heart disease Paternal Uncle   . Heart disease Maternal Uncle   . Lymphoma Sister     half-sister   Social History:  reports that he quit smoking about 29 years ago. His smoking use included Cigarettes. He has a 124 pack-year smoking history. He has never used smokeless tobacco. He reports that he drinks about 6 ounces of alcohol per week. He reports that he does not use illicit drugs.  Allergies:  Allergies  Allergen Reactions  . Other Nausea And Vomiting    Seafood-diarrhea  . Doxycycline Hyclate Other (See Comments)    esophagitis  . Lipitor (Atorvastatin Calcium) Other (See Comments)    Myalgia/myopathy  .  Promethazine Hcl Other (See Comments)    syncope  . Tetracycline Other (See Comments)    Esophagitis  . Tetanus Toxoid Rash     (Not in a hospital admission)  Results for orders placed during the hospital encounter of 04/26/12 (from the past 48 hour(s))  CBC     Status: Abnormal   Collection Time   04/26/12  8:16 PM      Component Value Range Comment   WBC 4.6  4.0 - 10.5 K/uL    RBC 2.69 (*) 4.22 - 5.81 MIL/uL    Hemoglobin 8.4 (*) 13.0 - 17.0 g/dL    HCT 40.9 (*) 81.1 - 52.0 %    MCV 94.4  78.0 -  100.0 fL    MCH 31.2  26.0 - 34.0 pg    MCHC 33.1  30.0 - 36.0 g/dL    RDW 91.4  78.2 - 95.6 %    Platelets 171  150 - 400 K/uL   COMPREHENSIVE METABOLIC PANEL     Status: Abnormal   Collection Time   04/26/12  8:16 PM      Component Value Range Comment   Sodium 140  135 - 145 mEq/L    Potassium 5.0  3.5 - 5.1 mEq/L    Chloride 105  96 - 112 mEq/L    CO2 26  19 - 32 mEq/L    Glucose, Bld 119 (*) 70 - 99 mg/dL    BUN 40 (*) 6 - 23 mg/dL    Creatinine, Ser 2.13 (*) 0.50 - 1.35 mg/dL    Calcium 9.7  8.4 - 08.6 mg/dL    Total Protein 6.9  6.0 - 8.3 g/dL    Albumin 3.9  3.5 - 5.2 g/dL    AST 15  0 - 37 U/L    ALT 12  0 - 53 U/L    Alkaline Phosphatase 86  39 - 117 U/L    Total Bilirubin 0.4  0.3 - 1.2 mg/dL    GFR calc non Af Amer 38 (*) >90 mL/min    GFR calc Af Amer 44 (*) >90 mL/min   TYPE AND SCREEN     Status: Normal   Collection Time   04/26/12  8:30 PM      Component Value Range Comment   ABO/RH(D) B POS      Antibody Screen NEG      Sample Expiration 04/29/2012     ABO/RH     Status: Normal   Collection Time   04/26/12  8:30 PM      Component Value Range Comment   ABO/RH(D) B POS     OCCULT BLOOD, POC DEVICE     Status: Normal   Collection Time   04/26/12  9:56 PM      Component Value Range Comment   Fecal Occult Bld POSITIVE     PREPARE FRESH FROZEN PLASMA     Status: Normal (Preliminary result)   Collection Time   04/26/12 10:03 PM      Component Value Range Comment   Unit Number [id number]      Blood Component Type THAWED PLASMA      Unit division 00      Status of Unit ALLOCATED      Transfusion Status OK TO TRANSFUSE      No results found.  Review of Systems  Constitutional: Negative.   HENT: Negative.   Eyes: Negative.   Respiratory: Negative.   Cardiovascular: Negative.   Gastrointestinal: Positive for  melena.  Genitourinary: Negative.   Musculoskeletal: Negative.   Skin: Negative.   Neurological: Positive for dizziness.   Endo/Heme/Allergies: Negative.   Psychiatric/Behavioral: Negative.     Blood pressure 122/55, pulse 53, temperature 97.6 F (36.4 C), temperature source Oral, resp. rate 20, SpO2 99.00%. Physical Exam  Constitutional: He is oriented to person, place, and time. He appears well-developed and well-nourished. No distress.  HENT:  Head: Normocephalic and atraumatic.  Right Ear: External ear normal.  Left Ear: External ear normal.  Nose: Nose normal.  Mouth/Throat: Oropharynx is clear and moist. No oropharyngeal exudate.  Eyes: Conjunctivae normal are normal. Pupils are equal, round, and reactive to light. Right eye exhibits no discharge. Left eye exhibits no discharge. No scleral icterus.  Neck: Normal range of motion. Neck supple.  Cardiovascular: Normal rate.   Respiratory: Effort normal and breath sounds normal. No respiratory distress. He has no wheezes. He has no rales.  GI: Soft. Bowel sounds are normal. He exhibits no distension. There is no tenderness. There is no rebound.  Musculoskeletal: He exhibits no edema and no tenderness.  Neurological: He is alert and oriented to person, place, and time.       Moves all extremities.  Skin: Skin is warm and dry. He is not diaphoretic.     Assessment/Plan #1. GI bleed most likely upper GI - patient will be kept n.p.o. Protonix IV. Recheck CBC and transfuse PRBC if there is any further drop in hemoglobin. Check INR. Consult GI. #2. Acute blood loss anemia secondary to #1. #3. Atrial fibrillation - rate controlled. Holding xarelto due to GI bleed. #4. CAD status post CABG - denies any chest pain at this time. Holding aspirin due to GI bleed. #5. Chronic kidney disease - creatinine appears at baseline closely follow metabolic panel and intake output. #6. COPD - not wheezing at this time. Continue Spiriva. #7. Hypothyroidism - continue Synthroid. #8. Abdominal aortic aneurysm - denies any abdominal pain.  CODE STATUS - full  code.  Bentleigh Stankus N. 04/26/2012, 11:35 PM

## 2012-04-26 NOTE — Progress Notes (Signed)
Subjective: 77 year old man with a history of having been dizzy yesterday and today. Last night he passed a tarry dark stools. He has a history of having some GI bleeding 4 years ago, etiology never found. He is on blood thinner because of his history of coronary artery disease. I coronary bypass surgery about a decade ago, but had a small heart attack in January of this year. He felt dizzy today when he gets up and down. It is more lightheadedness. He says he has had vertigo in the past and is not that. No abdominal pain. He is on a long list of medications  Objective: Pleasant man, pale appearing. Conjunctiva look pale. Throat clear. Neck supple without nodes. Chest clear to auscultation. Heart regular without murmurs gallops or arrhythmias. Abdomen soft without masses or tenderness. Digital rectal exam is unremarkable. A tiny bit of stool that was in there is dark.  Assessment: Dizziness Dark stools Pale, suspicious for anemia Remote history of GI bleeding  Plan: Check EKG, CBC, hemosure, and complete metabolic panel.  EKG has no acute findings. He is in a bradycardia, mildly irregular, but P waves are visible.  Orthostatic pressures are stable  Results for orders placed in visit on 04/26/12  POCT CBC      Component Value Range   WBC 5.8  4.6 - 10.2 K/uL   Lymph, poc 1.6  0.6 - 3.4   POC LYMPH PERCENT 28.4  10 - 50 %L   MID (cbc) 0.4  0 - 0.9   POC MID % 7.4  0 - 12 %M   POC Granulocyte 3.7  2 - 6.9   Granulocyte percent 64.2  37 - 80 %G   RBC 2.93 (*) 4.69 - 6.13 M/uL   Hemoglobin 8.7 (*) 14.1 - 18.1 g/dL   HCT, POC 40.9 (*) 81.1 - 53.7 %   MCV 100.0 (*) 80 - 97 fL   MCH, POC 29.7  27 - 31.2 pg   MCHC 29.7 (*) 31.8 - 35.4 g/dL   RDW, POC 91.4     Platelet Count, POC 223  142 - 424 K/uL   MPV 8.3  0 - 99.8 fL  IFOBT (OCCULT BLOOD)      Component Value Range   IFOBT Positive     Assessment: Upper GI bleed Coronary artery disease history Anemia Dizziness  (lightheadedness)  Plan: His gastroenterologist is Dr. Sheryn Bison. I will call in the [facility] on call.  Dr. Ailene Ravel recommended sending the patient down to the ER to get him admitted to the hospitalist service and they would help care for him. He agreed the patient had to come in because of the bleed and high risk patient.

## 2012-04-26 NOTE — ED Notes (Signed)
MD at bedside. 

## 2012-04-26 NOTE — Patient Instructions (Addendum)
Go to [facility] emergency room for arrangement for admission. Dr.Starke of [facility] study that you need to be admitted.

## 2012-04-26 NOTE — ED Notes (Signed)
Pt reports switching from plavix to xarelto in October; reports having black stool since yesterday, and started to feel dizzy; went to [facility] and states his hgb 8.7; went to [facility] and hgb 8.7, hct 29.3, had occult blood positive

## 2012-04-27 ENCOUNTER — Encounter (HOSPITAL_COMMUNITY): Payer: Self-pay | Admitting: Physician Assistant

## 2012-04-27 DIAGNOSIS — K922 Gastrointestinal hemorrhage, unspecified: Secondary | ICD-10-CM

## 2012-04-27 DIAGNOSIS — D649 Anemia, unspecified: Secondary | ICD-10-CM

## 2012-04-27 LAB — COMPREHENSIVE METABOLIC PANEL
ALT: 10 U/L (ref 0–53)
AST: 14 U/L (ref 0–37)
Albumin: 4.1 g/dL (ref 3.5–5.2)
Albumin: 3.4 g/dL — ABNORMAL LOW (ref 3.5–5.2)
Alkaline Phosphatase: 78 U/L (ref 39–117)
CO2: 25 mEq/L (ref 19–32)
Chloride: 107 mEq/L (ref 96–112)
Glucose, Bld: 88 mg/dL (ref 70–99)
Potassium: 4.8 mEq/L (ref 3.5–5.3)
Potassium: 4.7 mEq/L (ref 3.5–5.1)
Sodium: 136 mEq/L (ref 135–145)
Total Bilirubin: 0.6 mg/dL (ref 0.3–1.2)
Total Bilirubin: 0.4 mg/dL (ref 0.3–1.2)
Total Protein: 6.7 g/dL (ref 6.0–8.3)

## 2012-04-27 LAB — GLUCOSE, CAPILLARY
Glucose-Capillary: 95 mg/dL (ref 70–99)
Glucose-Capillary: 103 mg/dL — ABNORMAL HIGH (ref 70–99)
Glucose-Capillary: 107 mg/dL — ABNORMAL HIGH (ref 70–99)

## 2012-04-27 LAB — CBC
HCT: 23.4 % — ABNORMAL LOW (ref 39.0–52.0)
Platelets: 157 10*3/uL (ref 150–400)
RBC: 2.45 MIL/uL — ABNORMAL LOW (ref 4.22–5.81)
RDW: 14.3 % (ref 11.5–15.5)
WBC: 4.5 10*3/uL (ref 4.0–10.5)

## 2012-04-27 LAB — PREPARE RBC (CROSSMATCH)

## 2012-04-27 LAB — PREPARE FRESH FROZEN PLASMA

## 2012-04-27 LAB — MRSA PCR SCREENING: MRSA by PCR: NEGATIVE

## 2012-04-27 MED ORDER — ACETAMINOPHEN 650 MG RE SUPP: 650.0000 mg | Freq: Four times a day (QID) | RECTAL | Status: DC | PRN

## 2012-04-27 MED ORDER — ISOSORBIDE MONONITRATE ER 30 MG PO TB24
30.0000 mg | ORAL_TABLET | Freq: Every day | ORAL | Status: DC
Start: 2012-04-27 — End: 2012-04-27
  Filled 2012-04-27: qty 1

## 2012-04-27 MED ORDER — ROSUVASTATIN CALCIUM 10 MG PO TABS
10.0000 mg | ORAL_TABLET | Freq: Every day | ORAL | Status: DC
  Administered 2012-04-27 – 2012-04-29 (×3): 10 mg via ORAL
  Filled 2012-04-27 (×3): qty 1

## 2012-04-27 MED ORDER — DIPHENHYDRAMINE HCL 50 MG/ML IJ SOLN
25.0000 mg | Freq: Once | INTRAMUSCULAR | Status: AC
  Administered 2012-04-27: 25 mg via INTRAVENOUS
  Filled 2012-04-27: qty 1

## 2012-04-27 MED ORDER — AMIODARONE HCL 100 MG PO TABS
100.0000 mg | ORAL_TABLET | Freq: Every day | ORAL | Status: DC
  Administered 2012-04-27 – 2012-04-29 (×2): 100 mg via ORAL
  Filled 2012-04-27 (×3): qty 1

## 2012-04-27 MED ORDER — SODIUM CHLORIDE 0.9 % IJ SOLN
3.0000 mL | Freq: Two times a day (BID) | INTRAMUSCULAR | Status: DC
  Administered 2012-04-27 – 2012-04-29 (×4): 3 mL via INTRAVENOUS

## 2012-04-27 MED ORDER — ALLOPURINOL 300 MG PO TABS
300.0000 mg | ORAL_TABLET | Freq: Every day | ORAL | Status: DC
  Administered 2012-04-27 – 2012-04-29 (×2): 300 mg via ORAL
  Filled 2012-04-27 (×3): qty 1

## 2012-04-27 MED ORDER — NITROGLYCERIN 0.4 MG SL SUBL: 0.4000 mg | SUBLINGUAL_TABLET | SUBLINGUAL | Status: DC | PRN

## 2012-04-27 MED ORDER — ACETAMINOPHEN 325 MG PO TABS
650.0000 mg | ORAL_TABLET | Freq: Four times a day (QID) | ORAL | Status: DC | PRN
  Administered 2012-04-29: 650 mg via ORAL
  Filled 2012-04-27: qty 2

## 2012-04-27 MED ORDER — ONDANSETRON HCL 4 MG/2ML IJ SOLN: 4.0000 mg | Freq: Four times a day (QID) | INTRAMUSCULAR | Status: DC | PRN

## 2012-04-27 MED ORDER — TIOTROPIUM BROMIDE MONOHYDRATE 18 MCG IN CAPS
18.0000 ug | ORAL_CAPSULE | Freq: Every day | RESPIRATORY_TRACT | Status: DC
  Administered 2012-04-27 – 2012-04-29 (×3): 18 ug via RESPIRATORY_TRACT
  Filled 2012-04-27: qty 5

## 2012-04-27 MED ORDER — SODIUM CHLORIDE 0.9 % IV SOLN
INTRAVENOUS | Status: AC
  Administered 2012-04-27: 75 mL via INTRAVENOUS

## 2012-04-27 MED ORDER — ONDANSETRON HCL 4 MG PO TABS: 4.0000 mg | ORAL_TABLET | Freq: Four times a day (QID) | ORAL | Status: DC | PRN

## 2012-04-27 MED ORDER — PANTOPRAZOLE SODIUM 40 MG IV SOLR
40.0000 mg | Freq: Two times a day (BID) | INTRAVENOUS | Status: DC
  Administered 2012-04-27 – 2012-04-29 (×5): 40 mg via INTRAVENOUS
  Filled 2012-04-27 (×8): qty 40

## 2012-04-27 MED ORDER — LEVOTHYROXINE SODIUM 100 MCG PO TABS
100.0000 ug | ORAL_TABLET | Freq: Every day | ORAL | Status: DC
  Administered 2012-04-27 – 2012-04-29 (×3): 100 ug via ORAL
  Filled 2012-04-27 (×4): qty 1

## 2012-04-27 NOTE — Consult Note (Signed)
[facility] Consult: 8:43 AM 04/27/2012   Referring Provider: Dr Toniann Fail Primary Care Physician:  Oliver Barre, MD Primary Gastroenterologist:  Dr. Sheryn Bison   Reason for Consultation:  GI bleed  HPI: Omar Benson is a 77 y.o. male.  History of CAD status post CABG.  Hx A fib.  Switched from Plavix to Xarelto 6 weeks ago. Takes a daily 81 ASA and Protonix.  Hx of anemia several years ago (2007?) investigated with colon, EGD and capsule endoscopy.  He took po Iron for a while, was never transfused or admitted for GI bleed and the anemia resolved.  He no longer takes Iron.  Hx colon polyps and esophageal stricture.  Presented and admitted late yesterday PM with weakness, dizziness that began yesterday.  He normally has a series of 3 BMs, 2 solid and the final loose, every 5 days.  Had same pattern of BMs, only the stools were black instead of the usual brown,  between PM of 12/9 and AM of 12/10.  No nausea, abdominal pain, heartburn, anorexia, weight loss, NSAIDs. Does not eat red meat more than 1 to 2 times per month and does not eat leafy greens.  Hgb 8.7 today it is 8.0, had been 12.7 in 12/2011.  INR elevated at 2.1. Has not required transfusion of blood products.  Was kept NPO and started on BID IV Protonix.    He feels well today and is hungry.  ENDOSCOPIC STUDIES: 07/2010  Colonoscopy  Screening study ENDOSCOPIC IMPRESSION:  1) Sessile polyp at the splenic flexure  2) Sessile polyp in the mid transverse colon  3) Severe diverticulosis in the sigmoid to descending colon  segments  4) Internal hemorrhoids 1. Surgical [P], transverse and descending colon TUBULAR ADENOMA (TWO FRAGMENTS). NO HIGH GRADE DYSPLASIA OR MALIGNANCY IDENTIFIED. 2. Surgical [P], gastric biopsy BENIGN GASTRIC MUCOSA WITH CHRONIC GASTRITIS. NO INTESTINAL METAPLASIA OR HELICOBACTER PYLORI ORGANISMS IDENTIFIED.  EGD  04/2008 A stricture was found in the  distal esophagus. Dilitation Therapy # 54 f maloney dilator passed.some mild resistance.no heme. Retroflexed views revealed no abnormalities.   EGD  09/2005 for low ferritin.  Findings  - Normal: Proximal Esophagus to Distal Esophagus. Not Seen: Tumor.  Barrett's esophagus. Esophageal inflammation. Mucosal abnormality.  Stricture. Varices.  - OTHER FINDING: in Cardia. Comments: Atrophic flat mucosa and no  rugae and prominant vascular pattern seen in atrophic gastritis..  - Normal: Antrum to Duodenal 2nd Portion. Tumor. Ulcer. Mucosal  abnormality. AVM's. Foreign body. Biopsy/Normal taken. Comments: R/O  celiac disease....  ENDOSCOPIC BIOPSY, SMALL INTESTINE: BENIGN SMALL BOWEL MUCOSA.  NO ACTIVE INFLAMMATION OR VILLOUS ATROPHY IDENTIFIED.  Did not locate the capsule endo report.    Past Medical History  Diagnosis Date  . Hypothyroidism 12/05/2008  . HYPERLIPIDEMIA 09/19/2008  . GOUT 09/19/2008  . ANEMIA-IRON DEFICIENCY 09/19/2008  . HYPERTENSION 09/19/2008  . CAD 12/05/2008    s/p CABG; NSTEMI 05/2011 - LHC 05/25/11: LAD occluded, LIMA-LAD patent, ostial circumflex 50%, AV circumflex 70%, SVG-OM2 with extensive disease with thrombus (culprit vessel), RCA 80% and occluded, SVG-intermediate patent, SVG-PDA/PL A patent.  Given that the culprit vessel was a subtotally occluded heavily thrombotic graft to a smaller OM2, medical therapy was recommended.;   . Atrial fibrillation 04/19/2008    amiodarone rx;  Echocardiogram 05/26/11: No wall motion abnormalities, mild LVH, EF 60%.  . Atrial flutter 12/05/2008    s/p RFCA  . BRADYCARDIA 12/05/2008  . AAA 09/19/2008  . PERIPHERAL VASCULAR DISEASE 09/19/2008  . GERD 09/19/2008  .  DIVERTICULOSIS, COLON 09/19/2008  . RENAL INSUFFICIENCY 09/19/2008  . LOW BACK PAIN 09/19/2008  . PSA, INCREASED 09/19/2008  . COLONIC POLYPS, HX OF 09/19/2008  . GASTROINTESTINAL HEMORRHAGE, HX OF 04/19/2008  . NEPHROLITHIASIS, HX OF 01/11/2009  . CORONARY ARTERY BYPASS GRAFT, HX OF 12/05/2008     A. LIMA-LAD, VG-RI, VG-OM2, VG-RPD/RPL;  B. 05/2011 - NSTEMI - CATH WITH 4/5 PATENT GRAFTS AND NEW THROMBUS IN DISTAL VG-OM2 - MED RX  . ANXIETY 06/13/2010  . Arthritis   . COPD (chronic obstructive pulmonary disease)   . Myocardial infarction   . Allergy     Past Surgical History  Procedure Date  . Aorto-femoral bypass x5 - stress test neg 9/08   . Coronary artery bypass graft   . S/p afib ablation   . Cardiac catheterization 05/25/2011  . Colon surgery     Prior to Admission medications   Medication Sig Start Date End Date Taking? Authorizing Provider  allopurinol (ZYLOPRIM) 300 MG tablet Take 300 mg by mouth daily.     Yes Historical Provider, MD  amiodarone (PACERONE) 100 MG tablet Take 1 tablet (100 mg total) by mouth daily. 04/12/12  Yes Marinus Maw, MD  aspirin EC 81 MG tablet Take 1 tablet (81 mg total) by mouth daily. 03/01/12  Yes Marinus Maw, MD  isosorbide mononitrate (IMDUR) 30 MG 24 hr tablet Take 1 tablet (30 mg total) by mouth daily. 06/02/11  Yes Rhonda G Barrett, PA  levothyroxine (SYNTHROID, LEVOTHROID) 100 MCG tablet Take 100 mcg by mouth daily.     Yes Historical Provider, MD  Multiple Vitamin (MULTIVITAMIN) tablet Take 1 tablet by mouth at bedtime.    Yes Historical Provider, MD  nitroGLYCERIN (NITROSTAT) 0.4 MG SL tablet Place 1 tablet (0.4 mg total) under the tongue every 5 (five) minutes as needed for chest pain. 12/22/11 12/21/12 Yes Corwin Levins, MD  pantoprazole (PROTONIX) 40 MG tablet Take 40 mg by mouth daily.   Yes Historical Provider, MD  Rivaroxaban (XARELTO) 20 MG TABS Take 1 tablet (20 mg total) by mouth daily. 03/01/12  Yes Marinus Maw, MD  rosuvastatin (CRESTOR) 10 MG tablet Take 10 mg by mouth every morning.   Yes Historical Provider, MD  tiotropium (SPIRIVA HANDIHALER) 18 MCG inhalation capsule Place 1 capsule (18 mcg total) into inhaler and inhale daily. 01/11/12  Yes Barbaraann Share, MD    Scheduled Meds:    . allopurinol  300 mg Oral  Daily  . amiodarone  100 mg Oral Daily  . levothyroxine  100 mcg Oral QAC breakfast  . pantoprazole (PROTONIX) IV  40 mg Intravenous Q12H  . rosuvastatin  10 mg Oral q1800  . sodium chloride  3 mL Intravenous Q12H  . tiotropium  18 mcg Inhalation Daily  . [DISCONTINUED] isosorbide mononitrate  30 mg Oral Daily   Infusions:    . sodium chloride 75 mL/hr at 04/27/12 0600   PRN Meds: acetaminophen, acetaminophen, nitroGLYCERIN, ondansetron (ZOFRAN) IV, ondansetron   Allergies as of 04/26/2012 - Review Complete 04/26/2012  Allergen Reaction Noted  . Other Nausea And Vomiting 04/26/2012  . Doxycycline hyclate Other (See Comments) 07/30/2005  . Lipitor (atorvastatin calcium) Other (See Comments) 05/27/2011  . Promethazine hcl Other (See Comments) 09/02/2011  . Tetracycline Other (See Comments) 04/25/2008  . Tetanus toxoid Rash 04/25/2008    Family History  Problem Relation Age of Onset  . Diabetes Father   . Diabetes Sister   . Multiple myeloma Mother   .  Cancer Mother   . Other Mother     varicose veins  . Heart disease Paternal Uncle   . Heart disease Maternal Uncle   . Lymphoma Sister     half-sister    History   Social History  . Marital Status: Married    Spouse Name: Victorino Dike    Number of Children: N/A  . Years of Education: N/A   Occupational History  . self employeed Investment banker, operational.  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      Social History Main Topics  . Smoking status: Former Smoker -- 4.0 packs/day for 31 years    Types: Cigarettes    Quit date: 03/19/1983  . Smokeless tobacco: Never Used  . Alcohol Use: 6.0 oz/week    3 oz of [place], liqueur, 5 to 7 days per week  . Drug Use: No  . Sexually Active: Yes   Other Topics Concern  . Not on file   Social History Narrative   Married lives with his wife    REVIEW OF SYSTEMS: Constitutional:  Per HPI ENT:  Some small amount of blood in nasal discharge, gets congested a lot.  Wears dentures.  Poor dental care as a child led to loss of adult teeth.  Pulm:  No SOB, no PND, no cough.  Wife describes spells of nocturnal apnea lasting 20 seconds or more.  Never had sleep studies.  CV:  No palps, no chest pain, no pedal edema GU:  Urinated 2 x per night.  No frequency or incontinence GI:  As per HPI Heme:  Per HPI.  No unusual bruising but often has small areas of purpura..    Transfusions:  none Neuro:  No headaches, no seizures, no syncope Derm:  No rash, sores, itching Endocrine:  No sweats, no hair loss.  Immunization:  Does not take flu, pneumovax or zoster vaccinations. Travel:  None beyond [place].   PHYSICAL EXAM: Vital signs in last 24 hours: Temp:  [97.5 F (36.4 C)-98.3 F (36.8 C)] 97.6 F (36.4 C) (12/11 0812) Pulse Rate:  [51-61] 55  (12/11 0812) Resp:  [14-20] 18  (12/11 0812) BP: (97-151)/(55-107) 110/56 mmHg (12/11 0812) SpO2:  [98 %-100 %] 99 % (12/11 0812) Weight:  [76.4 kg (168 lb 6.9 oz)-81.194 kg (179 lb)] 76.4 kg (168 lb 6.9 oz) (12/11 0020)  General: well appearing older wm.  Head:  No facial edema or asymmetry Eyes:  No icterus or pallor Ears:  Not HOH  Nose:  No congestion or discharge Mouth:  Moist, pink oral MM.  Dentures in place Neck:  No mass or JVD Lungs:  Clear B.  Breathing easily Heart:  Irregular, not rapid.  No MRG Abdomen:  Soft, NT, ND, no HSM or mass.   Rectal: not done.  POCT testing of stool was FOB + yesterday.   Musc/Skeltl: no joint deformity or swelling Extremities:  No peripheral edema  Neurologic:  No tremor, limb weakness.  Oriented x 3. Fully awake and engaged Skin:  No rash or sores Tattoos:  none Nodes:  No inguinal or cervical adenopathy.    Psych:  Not depressed or anxious.   Intake/Output from previous day: 12/10 0701 - 12/11 0700 In: 75 [I.V.:75] Out: -  Intake/Output this shift: Total I/O In: 75 [I.V.:75] Out: 350 [Urine:350]  LAB RESULTS:  Basename 04/27/12 0224 04/26/12 2310 04/26/12 2016  WBC 4.5 4.9  4.6  HGB 8.0* 8.2* 8.4*  HCT 23.4* 24.2* 25.4*  PLT 157  170 171   BMET Lab Results  Component Value Date   NA 141 04/27/2012   NA 140 04/26/2012   NA 138 06/22/2011   K 4.7 04/27/2012   K 5.0 04/26/2012   K 4.4 06/22/2011   CL 107 04/27/2012   CL 105 04/26/2012   CL 105 06/22/2011   CO2 26 04/27/2012   CO2 26 04/26/2012   CO2 27 06/22/2011   GLUCOSE 99 04/27/2012   GLUCOSE 119* 04/26/2012   GLUCOSE 100* 06/22/2011   BUN 35* 04/27/2012   BUN 40* 04/26/2012   BUN 21 06/22/2011   CREATININE 1.63* 04/27/2012   CREATININE 1.69* 04/26/2012   CREATININE 1.8* 06/22/2011   CALCIUM 9.3 04/27/2012   CALCIUM 9.7 04/26/2012   CALCIUM 9.2 06/22/2011   LFT  Basename 04/27/12 0224 04/26/12 2016  PROT 6.1 6.9  ALBUMIN 3.4* 3.9  AST 14 15  ALT 10 12  ALKPHOS 78 86  BILITOT 0.4 0.4  BILIDIR -- --  IBILI -- --   PT/INR Lab Results  Component Value Date   INR 2.09* 04/27/2012   INR 1.07 05/31/2011   INR 1.12 05/31/2011    RADIOLOGY STUDIES: No results found.   IMPRESSION: *  GI bleed.  Given daily PPI, ulcers are not a likely cause.  Rule out AVMs. Pt is stable *  ABL anemia.  No transfusions to date.  No cardiac sxs.  Hgb stable *  Afib, chronic anticoagulation with xarelto, started about 6 weeks ago. INR is elevated.  *  Renal insufficiency *  Hx adenomatous colon polyps, up to date on screening.    PLAN: *  EGD 10:30 AM tomorrow.  Case d/w Dr Arlyce Dice. Clears ok.  CBC in AM. Continue Protonix.   LOS: 1 day   Jennye Moccasin  04/27/2012, 8:43 AM Pager: [phone]

## 2012-04-27 NOTE — Consult Note (Signed)
Patient was seen and examined.  Patient has had an acute GI bleed which most likely is secondary to an upper source. With history of anemia with full GI workup several years ago I am suspicious that he may have AVMs. Active peptic disease would be less likely in the face of PPI therapy. Colonic  lesion is unlikely in the face of recent colonoscopy. Any bleeding source obviously would be exacerbated by anticoagulation or antiplatelet therapy. These therapies, however, do not usually into spontaneous GI bleeding.  Recommendations #1 upper endoscopy; if not diagnostic for a GI bleeding source will proceed with small bowel enteroscopy #2 review prior capsule endoscopy report #3 hold xarelto line  #4 empiric PPI therapy

## 2012-04-27 NOTE — Progress Notes (Signed)
[facility] Progress Note [place] TEAM 1 - Stepdown/ICU TEAM   Blanca A Mounsey MRN:[id number] DOB: 03/19/1935 DOA: 04/26/2012 PCP: James Ameet, MD  Brief narrative: 77-year-old male [place] with history of CAD status post CABG, atrial fibrillation on xarelto presents with complaints of having dizziness weakness and having black stools. Patient started noticing black stools yesterday and also had 2 episodes today with one episode being loose. Patient in addition has been feeling dizzy. Denies any abdominal pain nausea vomiting. Denies any chest pain shortness of breath loss of consciousness. In the ER patient was found to be anemic with hemoglobin around 8.7 a drop of 4 g of hemoglobin from August of this year. Patient has been admitted for further management. Patient takes xarelto and aspirin. Denies having used any NSAIDs. Patient has had a colonoscopy last year / has had an EGD in 2009 which was showing esophageal strictures.   Assessment/Plan:  Melena / acute GIB To have EGD in AM - [facility] following  Acute blood loss anemia Hgb baseline 12.7 as of Aug 2013 - Hgb steadily drifting down - in setting of CAD will give 2U PRBC today as current tradjectory of serial Hgb will soon result in Hgb <8.0 w/o transfusion   CAD s/p CABG 2010 LIMA-LAD, VG-RI, VG-OM2, VG-RPD/RPL; B. 05/2011 - NSTEMI - CATH WITH 4/5 PATENT GRAFTS AND NEW THROMBUS IN DISTAL VG-OM2 - MED RX   Chronic afib on Xarelto Rate controlled - no option to anticoag at this time due to active bleeding  Hypothyroidism Cont home synthroid dose  HLD Cont med tx  HTN Not an active problem at this time due to blood loss PVD w/ AAA  CKD Baseline crt ~1.6 - follow trend  COPD Well compensated at this time  Code Status: FULL Disposition Plan: remain in SDU  Consultants: [facility]  Procedures: none  Antibiotics: none  DVT prophylaxis: SCDs only   HPI/Subjective: The patient is currently resting  comfortably.  He has not had a bowel movement since his admission.  He denies chest pain shortness of breath fevers chills nausea or vomiting.   Objective: Blood pressure 107/52, pulse 50, temperature 98.1 F (36.7 C), temperature source Oral, resp. rate 14, height 5' 9" (1.753 m), weight 76.4 kg (168 lb 6.9 oz), SpO2 99.00%.  Intake/Output Summary (Last 24 hours) at 04/27/12 1422 Last data filed at 04/27/12 1300  Gross per 24 hour  Intake    535 ml  Output    700 ml  Net   -165 ml     Exam: General: No acute respiratory distress Lungs: Clear to auscultation bilaterally without wheezes or crackles Cardiovascular: Regular rate without murmur gallop or rub normal S1 and S2 Abdomen: Nontender, nondistended, soft, bowel sounds positive, no rebound, no ascites, no appreciable mass Extremities: No significant cyanosis, clubbing, or edema bilateral lower extremities  Data Reviewed: Basic Metabolic Panel:  Lab 04/27/12 0224 04/26/12 2016  NA 141 140  K 4.7 5.0  CL 107 105  CO2 26 26  GLUCOSE 99 119*  BUN 35* 40*  CREATININE 1.63* 1.69*  CALCIUM 9.3 9.7  MG -- --  PHOS -- --   Liver Function Tests:  Lab 04/27/12 0224 04/26/12 2016  AST 14 15  ALT 10 12  ALKPHOS 78 86  BILITOT 0.4 0.4  PROT 6.1 6.9  ALBUMIN 3.4* 3.9   CBC:  Lab 04/27/12 0224 04/26/12 2310 04/26/12 2016 04/26/12 1752  WBC 4.5 4.9 4.6 5.8  NEUTROABS -- -- -- --    HGB 8.0* 8.2* 8.4* 8.7*  HCT 23.4* 24.2* 25.4* 29.3*  MCV 95.5 93.8 94.4 100.0*  PLT 157 170 171 --   CBG:  Lab 04/27/12 1213 04/27/12 0813 04/27/12 0431 04/27/12 0035  GLUCAP 103* 95 98 97    Recent Results (from the past 240 hour(s))  MRSA PCR SCREENING     Status: Normal   Collection Time   04/27/12 12:30 AM      Component Value Range Status Comment   MRSA by PCR NEGATIVE  NEGATIVE Final      Studies:  Recent x-ray studies have been reviewed in detail by the Attending Physician  Scheduled Meds:  Reviewed in detail by the  Attending Physician   Jeffrey T. McClung, MD [facility] Office  [phone] Pager [phone]  On-Call/Text Page:      [email or web address]      password [email or web address]  If 7PM-7AM, please contact night-coverage [email or web address] Password [email or web address] 04/27/2012, 2:22 PM   LOS: 1 day        

## 2012-04-28 ENCOUNTER — Encounter: Payer: Self-pay | Admitting: Family Medicine

## 2012-04-28 ENCOUNTER — Encounter (HOSPITAL_COMMUNITY): Payer: Self-pay | Admitting: *Deleted

## 2012-04-28 ENCOUNTER — Encounter (HOSPITAL_COMMUNITY): Admission: EM | Disposition: A | Discharge status: Home / Self Care | Payer: Self-pay | Source: Home / Self Care

## 2012-04-28 DIAGNOSIS — Q2733 Arteriovenous malformation of digestive system vessel: Secondary | ICD-10-CM

## 2012-04-28 DIAGNOSIS — K222 Esophageal obstruction: Secondary | ICD-10-CM

## 2012-04-28 DIAGNOSIS — J449 Chronic obstructive pulmonary disease, unspecified: Secondary | ICD-10-CM

## 2012-04-28 DIAGNOSIS — K5520 Angiodysplasia of colon without hemorrhage: Secondary | ICD-10-CM

## 2012-04-28 HISTORY — PX: ESOPHAGOGASTRODUODENOSCOPY: SHX5428

## 2012-04-28 LAB — TYPE AND SCREEN
Antibody Screen: NEGATIVE
Unit division: 0

## 2012-04-28 LAB — CBC
MCH: 31.6 pg (ref 26.0–34.0)
Platelets: 143 10*3/uL — ABNORMAL LOW (ref 150–400)
RBC: 2.97 MIL/uL — ABNORMAL LOW (ref 4.22–5.81)
WBC: 4.7 10*3/uL (ref 4.0–10.5)

## 2012-04-28 LAB — BASIC METABOLIC PANEL
BUN: 27 mg/dL — ABNORMAL HIGH (ref 6–23)
Chloride: 108 mEq/L (ref 96–112)
Creatinine, Ser: 1.75 mg/dL — ABNORMAL HIGH (ref 0.50–1.35)
GFR calc Af Amer: 42 mL/min — ABNORMAL LOW (ref >90)

## 2012-04-28 LAB — PROTIME-INR: Prothrombin Time: 16 seconds — ABNORMAL HIGH (ref 11.6–15.2)

## 2012-04-28 SURGERY — EGD (ESOPHAGOGASTRODUODENOSCOPY)
Anesthesia: Moderate Sedation

## 2012-04-28 MED ORDER — EPINEPHRINE HCL 0.1 MG/ML IJ SOLN
INTRAMUSCULAR | Status: AC
  Filled 2012-04-28: qty 10

## 2012-04-28 MED ORDER — SODIUM CHLORIDE 0.9 % IJ SOLN
INTRAMUSCULAR | Status: DC | PRN
  Administered 2012-04-28: 11:00:00

## 2012-04-28 MED ORDER — FENTANYL CITRATE 0.05 MG/ML IJ SOLN
INTRAMUSCULAR | Status: DC | PRN
  Administered 2012-04-28 (×3): 25 ug via INTRAVENOUS

## 2012-04-28 MED ORDER — FENTANYL CITRATE 0.05 MG/ML IJ SOLN
INTRAMUSCULAR | Status: AC
  Filled 2012-04-28: qty 2

## 2012-04-28 MED ORDER — MIDAZOLAM HCL 5 MG/5ML IJ SOLN
INTRAMUSCULAR | Status: DC | PRN
  Administered 2012-04-28 (×3): 2.5 mg via INTRAVENOUS

## 2012-04-28 MED ORDER — SPOT INK MARKER SYRINGE KIT
PACK | SUBMUCOSAL | Status: AC
  Filled 2012-04-28: qty 5

## 2012-04-28 MED ORDER — BUTAMBEN-TETRACAINE-BENZOCAINE 2-2-14 % EX AERO
INHALATION_SPRAY | CUTANEOUS | Status: DC | PRN
  Administered 2012-04-28: 2 via TOPICAL

## 2012-04-28 MED ORDER — MIDAZOLAM HCL 5 MG/ML IJ SOLN
INTRAMUSCULAR | Status: AC
  Filled 2012-04-28: qty 2

## 2012-04-28 MED ORDER — GLYCOPYRROLATE 0.2 MG/ML IJ SOLN
INTRAMUSCULAR | Status: DC | PRN
  Administered 2012-04-28: 0.2 mg via INTRAVENOUS

## 2012-04-28 MED ORDER — SPOT INK MARKER SYRINGE KIT
PACK | SUBMUCOSAL | Status: DC | PRN
  Administered 2012-04-28: 3 mL via SUBMUCOSAL

## 2012-04-28 MED ORDER — GLYCOPYRROLATE 0.2 MG/ML IJ SOLN
INTRAMUSCULAR | Status: AC
  Filled 2012-04-28: qty 1

## 2012-04-28 MED ORDER — SALINE SPRAY 0.65 % NA SOLN
1.0000 | NASAL | Status: DC | PRN
  Filled 2012-04-28: qty 44

## 2012-04-28 NOTE — H&P (View-Only) (Signed)
[facility] Progress Note [place] TEAM 1 - Stepdown/ICU TEAM   Omar Benson:[id number] DOB: 10/14/1934 DOA: 04/26/2012 PCP: Oliver Barre, MD  Brief narrative: 77 year old male [place] with history of CAD status post CABG, atrial fibrillation on xarelto presents with complaints of having dizziness weakness and having black stools. Patient started noticing black stools yesterday and also had 2 episodes today with one episode being loose. Patient in addition has been feeling dizzy. Denies any abdominal pain nausea vomiting. Denies any chest pain shortness of breath loss of consciousness. In the ER patient was found to be anemic with hemoglobin around 8.7 a drop of 4 g of hemoglobin from August of this year. Patient has been admitted for further management. Patient takes xarelto and aspirin. Denies having used any NSAIDs. Patient has had a colonoscopy last year / has had an EGD in 2009 which was showing esophageal strictures.   Assessment/Plan:  Melena / acute GIB To have EGD in AM - [facility] following  Acute blood loss anemia Hgb baseline 12.7 as of Aug 2013 - Hgb steadily drifting down - in setting of CAD will give 2U PRBC today as current tradjectory of serial Hgb will soon result in Hgb <8.0 w/o transfusion   CAD s/p CABG 2010 LIMA-LAD, VG-RI, VG-OM2, VG-RPD/RPL; B. 05/2011 - NSTEMI - CATH WITH 4/5 PATENT GRAFTS AND NEW THROMBUS IN DISTAL VG-OM2 - MED RX   Chronic afib on Xarelto Rate controlled - no option to anticoag at this time due to active bleeding  Hypothyroidism Cont home synthroid dose  HLD Cont med tx  HTN Not an active problem at this time due to blood loss PVD w/ AAA  CKD Baseline crt ~1.6 - follow trend  COPD Well compensated at this time  Code Status: FULL Disposition Plan: remain in SDU  Consultants: [facility]  Procedures: none  Antibiotics: none  DVT prophylaxis: SCDs only   HPI/Subjective: The patient is currently resting  comfortably.  He has not had a bowel movement since his admission.  He denies chest pain shortness of breath fevers chills nausea or vomiting.   Objective: Blood pressure 107/52, pulse 50, temperature 98.1 F (36.7 C), temperature source Oral, resp. rate 14, height 5\' 9"  (1.753 m), weight 76.4 kg (168 lb 6.9 oz), SpO2 99.00%.  Intake/Output Summary (Last 24 hours) at 04/27/12 1422 Last data filed at 04/27/12 1300  Gross per 24 hour  Intake    535 ml  Output    700 ml  Net   -165 ml     Exam: General: No acute respiratory distress Lungs: Clear to auscultation bilaterally without wheezes or crackles Cardiovascular: Regular rate without murmur gallop or rub normal S1 and S2 Abdomen: Nontender, nondistended, soft, bowel sounds positive, no rebound, no ascites, no appreciable mass Extremities: No significant cyanosis, clubbing, or edema bilateral lower extremities  Data Reviewed: Basic Metabolic Panel:  Lab 04/27/12 8119 04/26/12 2016  NA 141 140  K 4.7 5.0  CL 107 105  CO2 26 26  GLUCOSE 99 119*  BUN 35* 40*  CREATININE 1.63* 1.69*  CALCIUM 9.3 9.7  MG -- --  PHOS -- --   Liver Function Tests:  Lab 04/27/12 0224 04/26/12 2016  AST 14 15  ALT 10 12  ALKPHOS 78 86  BILITOT 0.4 0.4  PROT 6.1 6.9  ALBUMIN 3.4* 3.9   CBC:  Lab 04/27/12 0224 04/26/12 2310 04/26/12 2016 04/26/12 1752  WBC 4.5 4.9 4.6 5.8  NEUTROABS -- -- -- --  HGB 8.0* 8.2* 8.4* 8.7*  HCT 23.4* 24.2* 25.4* 29.3*  MCV 95.5 93.8 94.4 100.0*  PLT 157 170 171 --   CBG:  Lab 04/27/12 1213 04/27/12 0813 04/27/12 0431 04/27/12 0035  GLUCAP 103* 95 98 97    Recent Results (from the past 240 hour(s))  MRSA PCR SCREENING     Status: Normal   Collection Time   04/27/12 12:30 AM      Component Value Range Status Comment   MRSA by PCR NEGATIVE  NEGATIVE Final      Studies:  Recent x-ray studies have been reviewed in detail by the Attending Physician  Scheduled Meds:  Reviewed in detail by the  Attending Physician   Lonia Blood, MD [facility] Office  [phone] Pager [phone]  On-Call/Text Page:      [email or web address]      password [email or web address]  If 7PM-7AM, please contact night-coverage [email or web address] Password [email or web address] 04/27/2012, 2:22 PM   LOS: 1 day

## 2012-04-28 NOTE — Op Note (Signed)
[name] [address] [place], [address]   OPERATIVE PROCEDURE REPORT  PATIENT: Omar Benson, Omar Benson  MR#: [id number] BIRTHDATE: 10/07/1934 , 77  yrs. old GENDER: Male ENDOSCOPIST: Louis Meckel, MD REFERRED BY: PROCEDURE DATE: 04/28/2012 PROCEDURE:   Small bowel enteroscopy with control of bleeding ASA CLASS:   Class III INDICATIONS:1.  upper G.I.  bleeding. MEDICATIONS: These medications were titrated to patient response per physician's verbal order, Versed, Fentanyl-Detailed 75 mcg IV, Versed-Detailed 7.5 mg IV, and Robinul 0.2 mg IV TOPICAL ANESTHETIC:   Cetacaine Spray  DESCRIPTION OF PROCEDURE:   After the risks benefits and alternatives of the procedure were thoroughly explained, informed consent was obtained.  The EG-[id number] ([id number]) and EC-[id number] ([id number])  endoscope was introduced through the mouth  and advanced to the proximal jejunum jejunum , limited by Without limitations. The instrument was slowly withdrawn as the mucosa was fully examined.    Blood was found in the proximal jejunum.  There was active oozing of blood with fresh blood in the proximal jejunum. No mucosal lesion was seen after washing the area. The area was injected with 5 cc of 1-10,000 of epinephrine and then cauterized utilizing the argon plasma coagulator. Hemostasis was achieved. An Endo Clip was placed for marking and 3 cc of "spot" was injected submucosally just proximal to the area bleeding for marking purposes.  the remainder of the exam including the duodenum, stomach and proximal esophagus were normal. A moderate stricture was seen at the GE junction. After withdrawing the scope a #52 [place] Maloney dilator was passed with mild resistance. There was no heme. Retroflexed views revealed no abnormalities.    The scope was then withdrawn from the patient and the procedure terminated.  COMPLICATIONS: There were no complications.  ENDOSCOPIC IMPRESSION: 1.   active bleeding from the proximal jejunum, presumably secondary to a AVM-status post injection with epinephrine and cauterization with APC with successful hemostasis 2.  esophageal stricture-status post [facility] dilation  RECOMMENDATIONS: 1.  Continue to hold xarelto REPEAT EXAM:  _______________________________ eSignedLouis Meckel, MD 04/28/2012 11:30 AM   CC:

## 2012-04-28 NOTE — Progress Notes (Signed)
[facility] Progress Note [place] TEAM 1 - Stepdown/ICU TEAM   Omar Benson JXB:[id number] DOB: Feb 23, 1935 DOA: 04/26/2012 PCP: Oliver Barre, MD  Brief narrative: 77 year old male [place] with history of CAD status post CABG, atrial fibrillation on xarelto presents with complaints of having dizziness weakness and having black stools. Patient started noticing black stools yesterday and also had 2 episodes today with one episode being loose. Patient in addition has been feeling dizzy. Denies any abdominal pain nausea vomiting. Denies any chest pain shortness of breath loss of consciousness. In the ER patient was found to be anemic with hemoglobin around 8.7 a drop of 4 g of hemoglobin from August of this year. Patient has been admitted for further management. Patient takes xarelto and aspirin. Denies having used any NSAIDs. Patient has had a colonoscopy last year / has had an EGD in 2009 which was showing esophageal strictures.   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   Acute blood loss anemia Hgb baseline 12.7 as of Aug 2013 - Hgb steadily drifting down - in setting of CAD will give 2U PRBC today as current tradjectory of serial Hgb will soon result in Hgb <8.0 w/o transfusion   CAD s/p CABG 2010 LIMA-LAD, VG-RI, VG-OM2, VG-RPD/RPL; B. 05/2011 - NSTEMI - CATH WITH 4/5 PATENT GRAFTS AND NEW THROMBUS IN DISTAL VG-OM2 - MED RX   Chronic afib on Xarelto Rate controlled - no option to anticoag at this time due to active bleeding  Hypothyroidism Cont home synthroid dose  HLD Cont med tx  HTN Not an active problem at this time due to blood loss  PVD w/ AAA  CKD Baseline crt ~1.6 - follow trend  COPD Well compensated at this time  Code Status: FULL Disposition Plan: remain in  SDU  Consultants: [facility]  Procedures: none  Antibiotics: none  DVT prophylaxis: SCDs only   HPI/Subjective: The patient is resting in bed. No complaints other than wanting to go home. Per wife he is still forgetful about today's events. Pt states he only has a Bm every 5 days and last one was on Tues-   Objective: Blood pressure 108/54, pulse 51, temperature 97.9 F (36.6 C), temperature source Oral, resp. rate 17, height 5\' 9"  (1.753 m), weight 79.9 kg (176 lb 2.4 oz), SpO2 96.00%.  Intake/Output Summary (Last 24 hours) at 04/28/12 1419 Last data filed at 04/28/12 0646  Gross per 24 hour  Intake    850 ml  Output   2050 ml  Net  -1200 ml     Exam: General: No acute respiratory distress Lungs: Clear to auscultation bilaterally without wheezes or crackles Cardiovascular: Regular rate without murmur gallop or rub normal S1 and S2 Abdomen: Nontender, nondistended, soft, bowel sounds positive, no rebound, no ascites, no appreciable mass Extremities: No significant cyanosis, clubbing, or edema bilateral lower extremities  Data Reviewed: Basic Metabolic Panel:  Lab 04/28/12 1308 04/27/12 0224 04/26/12 2016 04/26/12 1750  NA 142 141 140 136  K 4.7 4.7 5.0 4.8  CL 108 107 105 107  CO2 27 26 26 25   GLUCOSE 99 99 119* 88  BUN 27* 35* 40* 40*  CREATININE 1.75* 1.63* 1.69* 1.70*  CALCIUM 9.2 9.3 9.7 9.4  MG -- -- -- --  PHOS -- -- -- --   Liver Function Tests:  Lab 04/27/12 0224 04/26/12 2016 04/26/12 1750  AST 14 15 16   ALT 10 12 12   ALKPHOS 78 86 78  BILITOT 0.4 0.4 0.6  PROT 6.1 6.9 6.7  ALBUMIN 3.4* 3.9 4.1   CBC:  Lab 04/28/12 0425 04/27/12 0224 04/26/12 2310 04/26/12 2016 04/26/12 1752  WBC 4.7 4.5 4.9 4.6 5.8  NEUTROABS -- -- -- -- --  HGB 9.4* 8.0* 8.2* 8.4* 8.7*  HCT 27.5* 23.4* 24.2* 25.4* 29.3*  MCV 92.6 95.5 93.8 94.4 100.0*  PLT 143* 157 170 171 --   CBG:  Lab 04/28/12 0132 04/27/12 2025 04/27/12 1213 04/27/12 0813 04/27/12 0431  GLUCAP  98 107* 103* 95 98    Recent Results (from the past 240 hour(s))  MRSA PCR SCREENING     Status: Normal   Collection Time   04/27/12 12:30 AM      Component Value Range Status Comment   MRSA by PCR NEGATIVE  NEGATIVE Final      Studies:  Recent x-ray studies have been reviewed in detail by the Attending Physician  Scheduled Meds:  Reviewed in detail by the Attending Physician   Calvert Cantor, MD [phone]  If 7PM-7AM, please contact night-coverage [email or web address] Password [email or web address] 04/28/2012, 2:19 PM   LOS: 2 days

## 2012-04-28 NOTE — Interval H&P Note (Signed)
History and Physical Interval Note:  04/28/2012 10:41 AM  Omar Benson  has presented today for surgery, with the diagnosis of anemia, blood in stool  The various methods of treatment have been discussed with the patient and family. After consideration of risks, benefits and other options for treatment, the patient has consented to  Procedure(s) (LRB) with comments: ESOPHAGOGASTRODUODENOSCOPY (EGD) (N/A) as a surgical intervention .  The patient's history has been reviewed, patient examined, no change in status, stable for surgery.  I have reviewed the patient's chart and labs.  Questions were answered to the patient's satisfaction.     The recent H&P (dated *04/27/12**) was reviewed, the patient was examined and there is no change in the patients condition since that H&P was completed.   Omar Benson  04/28/2012, 10:41 AM   Omar Benson

## 2012-04-28 NOTE — Progress Notes (Signed)
Enteroscopy demonstrated an area of active bleeding in the proximal jejunum. Presumably this was due to 2 an AVM. The area was  cauterized and injected with epinephrine hemostasis needed to hemostasis.  Recommend continue holding xarelto.

## 2012-04-29 ENCOUNTER — Observation Stay (HOSPITAL_COMMUNITY): Payer: Medicare Other

## 2012-04-29 ENCOUNTER — Encounter (HOSPITAL_COMMUNITY): Payer: Self-pay | Admitting: Gastroenterology

## 2012-04-29 DIAGNOSIS — I4892 Unspecified atrial flutter: Secondary | ICD-10-CM

## 2012-04-29 LAB — URINALYSIS, ROUTINE W REFLEX MICROSCOPIC
Bilirubin Urine: NEGATIVE
Glucose, UA: NEGATIVE mg/dL
Hgb urine dipstick: NEGATIVE
Ketones, ur: 15 mg/dL — AB
pH: 5.5 (ref 5.0–8.0)

## 2012-04-29 LAB — CBC
HCT: 27.4 % — ABNORMAL LOW (ref 39.0–52.0)
MCV: 92.9 fL (ref 78.0–100.0)
RDW: 15.4 % (ref 11.5–15.5)
WBC: 7 10*3/uL (ref 4.0–10.5)

## 2012-04-29 LAB — BASIC METABOLIC PANEL
BUN: 22 mg/dL (ref 6–23)
CO2: 23 mEq/L (ref 19–32)
Chloride: 104 mEq/L (ref 96–112)
Creatinine, Ser: 1.58 mg/dL — ABNORMAL HIGH (ref 0.50–1.35)

## 2012-04-29 MED ORDER — RIVAROXABAN 20 MG PO TABS: 20.0000 mg | ORAL_TABLET | Freq: Every day | ORAL | Status: DC

## 2012-04-29 MED ORDER — PANTOPRAZOLE SODIUM 40 MG PO TBEC: 40.0000 mg | DELAYED_RELEASE_TABLET | Freq: Every day | ORAL | Status: DC

## 2012-04-29 MED ORDER — FERROUS SULFATE 325 (65 FE) MG PO TABS
325.0000 mg | ORAL_TABLET | Freq: Every day | ORAL | Status: DC
  Filled 2012-04-29: qty 1

## 2012-04-29 MED ORDER — FERROUS SULFATE 325 (65 FE) MG PO TABS: 325.0000 mg | ORAL_TABLET | Freq: Every day | ORAL | Status: DC

## 2012-04-29 MED ORDER — ASPIRIN EC 81 MG PO TBEC: 81.0000 mg | DELAYED_RELEASE_TABLET | Freq: Every day | ORAL | Status: DC

## 2012-04-29 NOTE — Progress Notes (Signed)
Order to d/c pt home.  Wife at bedside.  Reviewed all d/c instructions with pt and wife.  Verbalized understanding.  Written copy of instructions and f/u appts given.  Reviewed GIB education with pt and wife.  Verbalized understanding.  IV d/ced without problems.  D/ced via w/c with wife and all belongings.

## 2012-04-29 NOTE — Progress Notes (Signed)
Omar Benson Daily Rounding Note 04/29/2012, 9:33 AM  SUBJECTIVE:       No stools or bleeding since 12/10.  Developed pain in RUQ yest after EGD.  It is relieved by Tylenol.  No nuasea.  Pain present to lesser degree this AM.  Wants to go home.  Still on clears  OBJECTIVE:         Vital signs in last 24 hours:    Temp:  [97.5 F (36.4 C)-100.7 F (38.2 C)] 97.5 F (36.4 C) (12/13 0753) Pulse Rate:  [51-63] 55  (12/13 0848) Resp:  [15-30] 16  (12/13 0753) BP: (85-157)/(44-97) 105/66 mmHg (12/13 0848) SpO2:  [89 %-100 %] 97 % (12/13 0753) Weight:  [79.8 kg (175 lb 14.8 oz)] 79.8 kg (175 lb 14.8 oz) (12/12 2335) Last BM Date: 04/26/12 General: looks well, still pale.    Heart: RRR, sinus rhythm on monitor Chest: clear B.  No labored breathing Abdomen: soft, active BS.  Tender without guarding in RUQ.  Extremities: no pedal edema Neuro/Psych:  Pleasant, relaxed. Fully oriented.   Intake/Output from previous day: 12/12 0701 - 12/13 0700 In: 360 [P.O.:360] Out: 1250 [Urine:1250]  Intake/Output this shift:    Lab Results:  Basename 04/29/12 0217 04/28/12 0425 04/27/12 0224  WBC 7.0 4.7 4.5  HGB 9.3* 9.4* 8.0*  HCT 27.4* 27.5* 23.4*  PLT 154 143* 157   BMET  Basename 04/29/12 0217 04/28/12 0855 04/27/12 0224  NA 139 142 141  K 4.6 4.7 4.7  CL 104 108 107  CO2 23 27 26   GLUCOSE 96 99 99  BUN 22 27* 35*  CREATININE 1.58* 1.75* 1.63*  CALCIUM 9.3 9.2 9.3   LFT  Basename 04/27/12 0224 04/26/12 2016 04/26/12 1750  PROT 6.1 6.9 6.7  ALBUMIN 3.4* 3.9 4.1  AST 14 15 16   ALT 10 12 12   ALKPHOS 78 86 78  BILITOT 0.4 0.4 0.6  BILIDIR -- -- --  IBILI -- -- --   PT/INR  Basename 04/28/12 0855 04/27/12 0224  LABPROT 16.0* 22.6*  INR 1.31 2.09*   Hepatitis Panel No results found for this basename: HEPBSAG,HCVAB,HEPAIGM,HEPBIGM in the last 72 hours  Studies/Results: Dg Chest Port 1 View  04/29/2012  *RADIOLOGY REPORT*  Clinical Data: Fever and cough.  PORTABLE  CHEST - 1 VIEW  Comparison: 05/31/2011  Findings: Postoperative changes in the mediastinum.  Normal heart size and pulmonary vascularity.  Linear fibrosis in the left mid lung and right lower lung.  Peribronchial thickening suggesting chronic bronchitis.  No focal airspace consolidation.  No blunting of costophrenic angles.  No pneumothorax.  Calcification of the aorta.  No significant change since previous study.  IMPRESSION: Fibrosis in the left mid and right lower lungs.  No evidence of active pulmonary disease.   Original Report Authenticated By: Burman Nieves, M.D.     ASSESMENT: *  Benson bleed in pt on Xarelto.  Jejunal bleeding, presumed from AVM.  Hemostasis obtained following epi injection and cauterization 12/13.  Pain in right abdomen may be from cauterization.  There is no rise in WBC count.  *  S/p dilatation of esoph stricture. *  Atrial Fib. Xarelto on hold *  ABL anemia. 2 units PRBCs 04/26/12.  Hgb stable.  *  Renal insufficiency.  Improved.    PLAN: *  Switch back to po Protonix.  *   Heart healthy diet.  *  Start Ferrous sulfate once daily.  He eats little red meat, no leafy greens  and is likely to have more AVMs which night release small amounts of blood. Note that he has not had iron studies, which will not be reliable post RBC transfusion, and he may not technically be iron deficient.  *  ? When can he restart Xarelto? *  Home later today if tolerates solid food without increase in abd pain.  *  If he stays overnight, I ordered CBC for AM.    LOS: 3 days   Jennye Moccasin  04/29/2012, 9:33 AM Pager: [phone]   I have personally taken an interval history, reviewed the chart, and examined the patient.  I agree with the extender's note, impression and recommendations.  Would try to keep off xarelto for 4-5 days.  Barbette Hair. Arlyce Dice, MD, [facility] [phone]

## 2012-04-29 NOTE — Progress Notes (Signed)
Pt temp is 100.7. Orders to call MD if >100.5. New orders received. Will monitor.  M.Foster Simpson, RN

## 2012-04-29 NOTE — Discharge Summary (Signed)
DISCHARGE SUMMARY  GILLIAN KLUEVER  MR#: [id number]  DOB:[birth year]  Date of Admission: 04/26/2012 Date of Discharge: 04/29/2012  Attending Physician:Shelby Anderle T  Patient's WUJ:WJXBJ Jonny Ruiz, MD  Consults: [facility] - Dr. Melvia Heaps  Disposition: Clear for discharge home  Follow-up Appts:     Follow-up Information    Follow up with Oliver Barre, MD. Schedule an appointment as soon as possible for a visit in 8 days.   Contact information:   [address] [place] [address] [phone]         Tests Needing Follow-up: followup CBC is recommended in 7-9 days, and followup iron studies are recommended in 4-8 weeks  Discharge Diagnoses: Melena / acute GIB - active bleeding from the proximal jejunum, presumably secondary to an AVM - status post injection with epinephrine and cauterization with APC with successful hemostasis  Acute blood loss anemia   CAD s/p CABG  Chronic afib on Xarelto  Hypothyroidism  HLD  HTN  PVD w/ AAA  CKD  COPD   Initial presentation: 77 year old male [place] with history of CAD status post CABG, atrial fibrillation on xarelto presents with complaints of having dizziness weakness and having black stools. Patient started noticing black stools the day prior to admission, and also had 2 episodes the day of admission with one episode being loose. Patient in addition has been feeling dizzy. Denied any abdominal pain nausea vomiting. Denied any chest pain shortness of breath loss of consciousness. In the ER patient was found to be anemic with hemoglobin around 8.7 - a drop of 4 g of hemoglobin from August of 2013. Patient has been admitted for further management. Patient takes xarelto and aspirin. Denies having used any NSAIDs. Patient had a colonoscopy last year / had an EGD in 2009 which revealed esophageal strictures.   Hospital Course:  Melena / acute GIB  All anticoagulants were held - [facility] was consulted - the  patient underwent an EGD on 04/28/2012 revealing active bleeding from the proximal jejunum, presumably secondary  to a AVM-status post injection with epinephrine and cauterization with APC with successful hemostasis - his diet was advanced and he tolerated this without difficulty - there were no more episodes of bleeding appreciable - the patient's blood pressure stabilized - the patient requested discharge home on December 13 and it was felt he was clinically stable to allow this to occur - the patient was specifically advised to return immediately to the hospital should he develop recurrent obvious bleeding, lightheadedness, dizziness, or hypotension - he was also advised to seek immediate medical attention should he develop severe odynophagia or abdominal pain - his wife is present at the time of this counseling and voice understanding as did the patient - the GI team has cleared the patient to resume anticoagulation in 5 days  Acute blood loss anemia  Hgb baseline 12.7 as of Aug 2013 - Hgb steadily drifted down to a nadir of 8.0 - in the setting of CAD he was given 2U PRBC as the then current tradjectory of serial Hgb would have soon resulted in Hgb <8.0 w/o transfusion - he tolerated his transfusion without difficulty - at discharge his hemoglobin is stable at 9.3-9.4 no evidence of ongoing bleeding - an anemia panel was not obtained prior to transfusion therefore it's not clear if this patient is truly iron deficient from his blood loss or not - the decision was made not to initiate iron therapy immediately post discharge  so as not to cloud the picture with dark stools in the outpatient setting - the patient is advised that once he has a regular appearing stool that he should then begin iron sulfate over-the-counter 325 mg a day - he is advised that chronic iron therapy may not be required and that followup of his iron levels in a month to 2 months will help determine if ongoing therapy is necessary    CAD s/p CABG  2010 LIMA-LAD, VG-RI, VG-OM2, VG-RPD/RPL; B. 05/2011 - NSTEMI - CATH WITH 4/5 PATENT GRAFTS AND NEW THROMBUS IN DISTAL VG-OM2 - MED RX - the patient remained asymptomatic throughout his hospital stay  Chronic afib on Xarelto  The patient appeared to remain in normal sinus rhythm throughout his hospital stay - his usual antiarrhythmic for continued - his anticoagulants had to be held due to his active bleeding - the patient is advised to resume his Xarelto and aspirin 5 days status post discharge and to followup with his cardiologist as per his usual schedule  Hypothyroidism  Cont home synthroid dose   HLD  Cont med tx   HTN  Not an active problem at this time due to blood loss - blood pressure increasing at time of discharge therefore will resume usual home blood pressure medications  PVD w/ AAA   CKD  Baseline crt ~1.6 - follow trend   COPD  Well compensated     Medication List     As of 04/29/2012  4:28 PM    TAKE these medications         allopurinol 300 MG tablet   Commonly known as: ZYLOPRIM   Take 300 mg by mouth daily.      amiodarone 100 MG tablet   Commonly known as: PACERONE   Take 1 tablet (100 mg total) by mouth daily.      aspirin EC 81 MG tablet   Take 1 tablet (81 mg total) by mouth daily.   Start taking on: 05/04/2012      ferrous sulfate 325 (65 FE) MG tablet   Take 1 tablet (325 mg total) by mouth daily with breakfast.   Start taking on: 05/04/2012      isosorbide mononitrate 30 MG 24 hr tablet   Commonly known as: IMDUR   Take 1 tablet (30 mg total) by mouth daily.      levothyroxine 100 MCG tablet   Commonly known as: SYNTHROID, LEVOTHROID   Take 100 mcg by mouth daily.      multivitamin tablet   Take 1 tablet by mouth at bedtime.      nitroGLYCERIN 0.4 MG SL tablet   Commonly known as: NITROSTAT   Place 1 tablet (0.4 mg total) under the tongue every 5 (five) minutes as needed for chest pain.      pantoprazole 40 MG  tablet   Commonly known as: PROTONIX   Take 40 mg by mouth daily.      Rivaroxaban 20 MG Tabs   Commonly known as: XARELTO   Take 1 tablet (20 mg total) by mouth daily.   Start taking on: 05/04/2012      rosuvastatin 10 MG tablet   Commonly known as: CRESTOR   Take 10 mg by mouth every morning.      tiotropium 18 MCG inhalation capsule   Commonly known as: SPIRIVA   Place 1 capsule (18 mcg total) into inhaler and inhale daily.        Day of Discharge BP 140/66  Pulse 59  Temp 98.1 F (36.7 C) (Oral)  Resp 22  Ht 5\' 9"  (1.753 m)  Wt 79.8 kg (175 lb 14.8 oz)  BMI 25.98 kg/m2  SpO2 100%  Physical Exam: General: No acute respiratory distress Lungs: Clear to auscultation bilaterally without wheezes or crackles Cardiovascular: Regular rate and rhythm without murmur gallop or rub normal S1 and S2 Abdomen: Nontender, nondistended, soft, bowel sounds positive, no rebound, no ascites, no appreciable mass Extremities: No significant cyanosis, clubbing, or edema bilateral lower extremities  Results for orders placed during the hospital encounter of 04/26/12 (from the past 24 hour(s))  URINALYSIS, ROUTINE W REFLEX MICROSCOPIC     Status: Abnormal   Collection Time   04/29/12  1:20 AM      Component Value Range   Color, Urine YELLOW  YELLOW   APPearance CLEAR  CLEAR   Specific Gravity, Urine 1.018  1.005 - 1.030   pH 5.5  5.0 - 8.0   Glucose, UA NEGATIVE  NEGATIVE mg/dL   Hgb urine dipstick NEGATIVE  NEGATIVE   Bilirubin Urine NEGATIVE  NEGATIVE   Ketones, ur 15 (*) NEGATIVE mg/dL   Protein, ur NEGATIVE  NEGATIVE mg/dL   Urobilinogen, UA 1.0  0.0 - 1.0 mg/dL   Nitrite NEGATIVE  NEGATIVE   Leukocytes, UA NEGATIVE  NEGATIVE  CBC     Status: Abnormal   Collection Time   04/29/12  2:17 AM      Component Value Range   WBC 7.0  4.0 - 10.5 K/uL   RBC 2.95 (*) 4.22 - 5.81 MIL/uL   Hemoglobin 9.3 (*) 13.0 - 17.0 g/dL   HCT 95.6 (*) 38.7 - 56.4 %   MCV 92.9  78.0 - 100.0 fL    MCH 31.5  26.0 - 34.0 pg   MCHC 33.9  30.0 - 36.0 g/dL   RDW 33.2  95.1 - 88.4 %   Platelets 154  150 - 400 K/uL  BASIC METABOLIC PANEL     Status: Abnormal   Collection Time   04/29/12  2:17 AM      Component Value Range   Sodium 139  135 - 145 mEq/L   Potassium 4.6  3.5 - 5.1 mEq/L   Chloride 104  96 - 112 mEq/L   CO2 23  19 - 32 mEq/L   Glucose, Bld 96  70 - 99 mg/dL   BUN 22  6 - 23 mg/dL   Creatinine, Ser 1.66 (*) 0.50 - 1.35 mg/dL   Calcium 9.3  8.4 - 06.3 mg/dL   GFR calc non Af Amer 41 (*) >90 mL/min   GFR calc Af Amer 48 (*) >90 mL/min    Time spent in discharge (includes decision making & examination of pt): >30 minutes  04/29/2012, 4:28 PM   Lonia Blood, MD [facility] Office  [phone] Pager [phone]  On-Call/Text Page:      [email or web address]      password [facility]

## 2012-05-03 ENCOUNTER — Other Ambulatory Visit: Payer: Self-pay | Admitting: Physician Assistant

## 2012-05-03 ENCOUNTER — Ambulatory Visit (INDEPENDENT_AMBULATORY_CARE_PROVIDER_SITE_OTHER): Payer: Medicare Other | Admitting: Family Medicine

## 2012-05-03 VITALS — BP 106/64 | HR 58 | Temp 98.4°F | Resp 18 | Ht 68.5 in | Wt 176.0 lb

## 2012-05-03 DIAGNOSIS — N289 Disorder of kidney and ureter, unspecified: Secondary | ICD-10-CM

## 2012-05-03 DIAGNOSIS — M546 Pain in thoracic spine: Secondary | ICD-10-CM

## 2012-05-03 DIAGNOSIS — Z8679 Personal history of other diseases of the circulatory system: Secondary | ICD-10-CM

## 2012-05-03 DIAGNOSIS — D649 Anemia, unspecified: Secondary | ICD-10-CM

## 2012-05-03 DIAGNOSIS — R0789 Other chest pain: Secondary | ICD-10-CM

## 2012-05-03 DIAGNOSIS — M549 Dorsalgia, unspecified: Secondary | ICD-10-CM

## 2012-05-03 LAB — COMPREHENSIVE METABOLIC PANEL
Alkaline Phosphatase: 82 U/L (ref 39–117)
BUN: 22 mg/dL (ref 6–23)
CO2: 24 mEq/L (ref 19–32)
Creat: 1.74 mg/dL — ABNORMAL HIGH (ref 0.50–1.35)
Glucose, Bld: 116 mg/dL — ABNORMAL HIGH (ref 70–99)
Total Bilirubin: 0.5 mg/dL (ref 0.3–1.2)
Total Protein: 6.2 g/dL (ref 6.0–8.3)

## 2012-05-03 LAB — POCT CBC
HCT, POC: 32.2 % — AB (ref 43.5–53.7)
Hemoglobin: 9.8 g/dL — AB (ref 14.1–18.1)
Lymph, poc: 1.5 (ref 0.6–3.4)
MCH, POC: 29.8 pg (ref 27–31.2)
MCHC: 30.4 g/dL — AB (ref 31.8–35.4)
MCV: 97.8 fL — AB (ref 80–97)
POC Granulocyte: 2.5 (ref 2–6.9)
POC LYMPH PERCENT: 34.2 %L (ref 10–50)
RDW, POC: 15.2 %
WBC: 4.4 10*3/uL — AB (ref 4.6–10.2)

## 2012-05-03 MED ORDER — METAXALONE 800 MG PO TABS: 400.0000 mg | ORAL_TABLET | Freq: Three times a day (TID) | ORAL | Status: DC

## 2012-05-03 NOTE — Progress Notes (Signed)
Subjective: Patient was here last week with a upper GI bleed and melena. He was sent to the hospital with the concern that he might have a vascular malformation causing the bleeding, which is what it turned out to be. He had it cauterized and re\re endoscoped. He was stable and sent home on iron. When he got home Friday night he started having intense pain in his back between the scapula, rotating around the sides and posterior chest wall around into the front. No nausea vomiting or diaphoresis. He used to have these pains in the past. He was worked up extensively and everything was okay. This January he then had a heart attack. He had some of these pains before he had his heart troubles, and he just wanted to be sure. Sometimes he can stretch and very physicians and get relief, sometimes a cane.  Objective: Pleasant alert gentleman in no major distress at this time. He has much better color than he did last visit pain he with so anemic and pale. Throat clear. Neck supple without nodes thyromegaly. No carotid bruits. Chest clear to auscultation. Heart regular. Grade 1-2/6 systolic murmur along left sternal border. Abdomen has normal bowel sounds, soft without organomegaly mass or tenderness. No chest wall tenderness except for the xiphoid which is very tender.  Assessment: Back and atypical chest pain Recent upper GI bleed Recent anemia Recent renal insufficiency  Plan: Check labs and EKG  EKG no acute changes noted Results for orders placed in visit on 05/03/12  POCT CBC      Component Value Range   WBC 4.4 (*) 4.6 - 10.2 K/uL   Lymph, poc 1.5  0.6 - 3.4   POC LYMPH PERCENT 34.2  10 - 50 %L   MID (cbc) 0.4  0 - 0.9   POC MID % 8.1  0 - 12 %M   POC Granulocyte 2.5  2 - 6.9   Granulocyte percent 57.7  37 - 80 %G   RBC 3.29 (*) 4.69 - 6.13 M/uL   Hemoglobin 9.8 (*) 14.1 - 18.1 g/dL   HCT, POC 60.4 (*) 54.0 - 53.7 %   MCV 97.8 (*) 80 - 97 fL   MCH, POC 29.8  27 - 31.2 pg   MCHC 30.4 (*)  31.8 - 35.4 g/dL   RDW, POC 98.1     Platelet Count, POC 263  142 - 424 K/uL   MPV 6.6  0 - 99.8 fL   Send to ortho if back continues to hurt See instructions.   Skelaxin See his cardiologist sometime soon.  I do not think this is cardiac

## 2012-05-03 NOTE — Patient Instructions (Addendum)
A half pill of the muscle relaxant 3 times daily.  Use Tylenol for pain, maximum 1000 mg(2x500 mg extra strength) 3 times daily  Or your cardiologist and see if he can recheck you sometime in next week or so  If pains or abruptly worse go to the emergency room or if associated with breaking out in a sweat or short of breath or extreme weakness  If you continue having problems we may need to send you to a back specialist.

## 2012-05-05 LAB — CULTURE, BLOOD (ROUTINE X 2): Culture: NO GROWTH

## 2012-05-23 ENCOUNTER — Other Ambulatory Visit: Payer: Self-pay

## 2012-05-23 MED ORDER — AMIODARONE HCL 100 MG PO TABS: 100.0000 mg | ORAL_TABLET | Freq: Every day | ORAL | Status: DC

## 2012-05-27 ENCOUNTER — Other Ambulatory Visit (HOSPITAL_COMMUNITY): Payer: Self-pay | Admitting: Physician Assistant

## 2012-05-30 ENCOUNTER — Other Ambulatory Visit: Payer: Self-pay

## 2012-05-30 MED ORDER — AMIODARONE HCL 100 MG PO TABS: 100.0000 mg | ORAL_TABLET | Freq: Every day | ORAL | Status: DC

## 2012-08-22 ENCOUNTER — Encounter: Payer: Self-pay | Admitting: Internal Medicine

## 2012-08-22 ENCOUNTER — Ambulatory Visit (INDEPENDENT_AMBULATORY_CARE_PROVIDER_SITE_OTHER): Payer: Medicare Other | Admitting: Internal Medicine

## 2012-08-22 VITALS — BP 132/82 | HR 57 | Temp 97.6°F | Wt 181.0 lb

## 2012-08-22 DIAGNOSIS — J019 Acute sinusitis, unspecified: Secondary | ICD-10-CM

## 2012-08-22 DIAGNOSIS — M549 Dorsalgia, unspecified: Secondary | ICD-10-CM

## 2012-08-22 DIAGNOSIS — I4891 Unspecified atrial fibrillation: Secondary | ICD-10-CM

## 2012-08-22 DIAGNOSIS — J309 Allergic rhinitis, unspecified: Secondary | ICD-10-CM

## 2012-08-22 DIAGNOSIS — M546 Pain in thoracic spine: Secondary | ICD-10-CM

## 2012-08-22 DIAGNOSIS — J449 Chronic obstructive pulmonary disease, unspecified: Secondary | ICD-10-CM

## 2012-08-22 MED ORDER — AMIODARONE HCL 100 MG PO TABS: 100.0000 mg | ORAL_TABLET | Freq: Every day | ORAL | Status: DC

## 2012-08-22 MED ORDER — ISOSORBIDE MONONITRATE ER 30 MG PO TB24: ORAL_TABLET | ORAL | Status: DC

## 2012-08-22 MED ORDER — TIOTROPIUM BROMIDE MONOHYDRATE 18 MCG IN CAPS: 18.0000 ug | ORAL_CAPSULE | Freq: Every day | RESPIRATORY_TRACT | Status: DC

## 2012-08-22 MED ORDER — RIVAROXABAN 20 MG PO TABS: 20.0000 mg | ORAL_TABLET | Freq: Every day | ORAL | Status: DC

## 2012-08-22 MED ORDER — ROSUVASTATIN CALCIUM 10 MG PO TABS: 10.0000 mg | ORAL_TABLET | Freq: Every morning | ORAL | Status: DC

## 2012-08-22 MED ORDER — LEVOTHYROXINE SODIUM 100 MCG PO TABS: 100.0000 ug | ORAL_TABLET | Freq: Every day | ORAL | Status: DC

## 2012-08-22 MED ORDER — ALLOPURINOL 300 MG PO TABS: 300.0000 mg | ORAL_TABLET | Freq: Every day | ORAL | Status: DC

## 2012-08-22 MED ORDER — AZITHROMYCIN 250 MG PO TABS: ORAL_TABLET | ORAL | Status: DC

## 2012-08-22 MED ORDER — FLUTICASONE PROPIONATE 50 MCG/ACT NA SUSP: 2.0000 | Freq: Every day | NASAL | Status: DC

## 2012-08-22 MED ORDER — PANTOPRAZOLE SODIUM 40 MG PO TBEC: 40.0000 mg | DELAYED_RELEASE_TABLET | Freq: Every day | ORAL | Status: DC

## 2012-08-22 NOTE — Assessment & Plan Note (Signed)
stable overall by history and exam, recent data reviewed with pt, and pt to continue medical treatment as before,  to f/u any worsening symptoms or concerns SpO2 Readings from Last 3 Encounters:  08/22/12 97%  05/03/12 97%  04/29/12 100%

## 2012-08-22 NOTE — Assessment & Plan Note (Signed)
stable overall by history and exam, and pt to continue medical treatment as before,  to f/u any worsening symptoms or concerns 

## 2012-08-22 NOTE — Assessment & Plan Note (Signed)
Mild to mod, for antibx course,  to f/u any worsening symptoms or concerns 

## 2012-08-22 NOTE — Patient Instructions (Addendum)
Please take all new medication as prescribed - the antibiotic, and flonase You can also take Mucinex (or it's generic off brand) for congestion, and tylenol as needed for pain. Please continue all other medications as before, and refills have been done if requested - the 90 days prescriptions Thank you for enrolling in MyChart. Please follow the instructions below to securely access your online medical record. MyChart allows you to send messages to your doctor, view your test results, renew your prescriptions, schedule appointments, and more. To Log into My Chart online, please go by [employer] or [employer] to [email or web address], or download the MyChart App from the [employer] of [employer] .  Your Username is: [email or web address] ([email or web address]) Please send a practice Message on Mychart later today.

## 2012-08-22 NOTE — Progress Notes (Signed)
Subjective:    Patient ID: Omar Benson, male    DOB: [birth year], 78 y.o.   MRN: [id number]  HPI   Here with 2-3 days acute onset fever, facial pain, pressure, headache, general weakness and malaise, and greenish d/c, with mild ST and cough, but pt denies chest pain, wheezing, increased sob or doe, orthopnea, PND, increased LE swelling, palpitations, dizziness or syncope.  Does have several wks ongoing nasal allergy symptoms with clearish congestion, itch and sneezing, without fever, pain, ST, cough, swelling or wheezing.   Pt denies polydipsia, polyuria Past Medical History  Diagnosis Date  . Hypothyroidism 12/05/2008  . HYPERLIPIDEMIA 09/19/2008  . GOUT 09/19/2008  . ANEMIA-IRON DEFICIENCY 09/19/2008  . HYPERTENSION 09/19/2008  . CAD 12/05/2008    s/p CABG; NSTEMI 05/2011 - LHC 05/25/11: LAD occluded, LIMA-LAD patent, ostial circumflex 50%, AV circumflex 70%, SVG-OM2 with extensive disease with thrombus (culprit vessel), RCA 80% and occluded, SVG-intermediate patent, SVG-PDA/PL A patent.  Given that the culprit vessel was a subtotally occluded heavily thrombotic graft to a smaller OM2, medical therapy was recommended.;   . Atrial fibrillation 04/19/2008    amiodarone rx;  Echocardiogram 05/26/11: No wall motion abnormalities, mild LVH, EF 60%.  . Atrial flutter 12/05/2008    s/p RFCA  . BRADYCARDIA 12/05/2008  . AAA 09/19/2008  . PERIPHERAL VASCULAR DISEASE 09/19/2008  . GERD 09/19/2008  . DIVERTICULOSIS, COLON 09/19/2008  . RENAL INSUFFICIENCY 09/19/2008  . LOW BACK PAIN 09/19/2008  . PSA, INCREASED 09/19/2008  . COLONIC POLYPS, HX OF 09/19/2008    adenomatous polyps 07/2010  . GASTROINTESTINAL HEMORRHAGE, HX OF 04/19/2008  . NEPHROLITHIASIS, HX OF 01/11/2009  . CORONARY ARTERY BYPASS GRAFT, HX OF 12/05/2008    A. LIMA-LAD, VG-RI, VG-OM2, VG-RPD/RPL;  B. 05/2011 - NSTEMI - CATH WITH 4/5 PATENT GRAFTS AND NEW THROMBUS IN DISTAL VG-OM2 - MED RX  . ANXIETY 06/13/2010  . Arthritis   . COPD (chronic obstructive pulmonary  disease)   . Myocardial infarction   . Allergy   . Benign esophageal stricture 09/2075    dilated during EGD   Past Surgical History  Procedure Laterality Date  . Aorto-femoral bypass x5 - stress test neg 9/08    . Coronary artery bypass graft    . S/p afib ablation  2004  . Cardiac catheterization  05/25/2011  . Colon surgery    . Esophagogastroduodenoscopy  04/28/2012    Procedure: ESOPHAGOGASTRODUODENOSCOPY (EGD);  Surgeon: Louis Meckel, MD;  Location: [facility] END[facility];  Service: Endoscopy;  Laterality: N/A;    reports that he quit smoking about 29 years ago. His smoking use included Cigarettes. He has a 124 pack-year smoking history. He has never used smokeless tobacco. He reports that he drinks about 6.0 ounces of alcohol per week. He reports that he does not use illicit drugs. family history includes Cancer in his mother; Diabetes in his father and sister; Heart disease in his maternal uncle and paternal uncle; Lymphoma in his sister; Multiple myeloma in his mother; and Other in his mother. Allergies  Allergen Reactions  . Other Nausea And Vomiting    Seafood-diarrhea  . Doxycycline Hyclate Other (See Comments)    esophagitis  . Lipitor (Atorvastatin Calcium) Other (See Comments)    Myalgia/myopathy  . Promethazine Hcl Other (See Comments)    syncope  . Tetracycline Other (See Comments)    Esophagitis  . Tetanus Toxoid Rash   Current Outpatient Prescriptions on File Prior to Visit  Medication Sig Dispense Refill  . aspirin  EC 81 MG tablet Take 1 tablet (81 mg total) by mouth daily.      . ferrous sulfate 325 (65 FE) MG tablet Take 1 tablet (325 mg total) by mouth daily with breakfast.      . metaxalone (SKELAXIN) 800 MG tablet Take 0.5 tablets (400 mg total) by mouth 3 (three) times daily.  20 tablet  0  . Multiple Vitamin (MULTIVITAMIN) tablet Take 1 tablet by mouth at bedtime.       . nitroGLYCERIN (NITROSTAT) 0.4 MG SL tablet Place 1 tablet (0.4 mg total) under the tongue  every 5 (five) minutes as needed for chest pain.  25 tablet  3   No current facility-administered medications on file prior to visit.   Review of Systems  Constitutional: Negative for unexpected weight change, or unusual diaphoresis  HENT: Negative for tinnitus.   Eyes: Negative for photophobia and visual disturbance.  Respiratory: Negative for choking and stridor.   Gastrointestinal: Negative for vomiting and blood in stool.  Genitourinary: Negative for hematuria and decreased urine volume.  Musculoskeletal: Negative for acute joint swelling Skin: Negative for color change and wound.  Neurological: Negative for tremors and numbness other than noted  Psychiatric/Behavioral: Negative for decreased concentration or  hyperactivity.       Objective:   Physical Exam BP 132/82  Pulse 57  Temp(Src) 97.6 F (36.4 C) (Oral)  Wt 181 lb (82.101 kg)  BMI 27.12 kg/m2  SpO2 97% VS noted, mild ill Constitutional: Pt appears well-developed and well-nourished.  HENT: Head: NCAT.  Right Ear: External ear normal.  Left Ear: External ear normal.  Eyes: Conjunctivae and EOM are normal. Pupils are equal, round, and reactive to light.  Bilat tm's with mild erythema.  Max sinus areas mild tender.  Pharynx with mild erythema, no exudate Neck: Normal range of motion. Neck supple.  Cardiovascular: Normal rate and  irregular rhythm.   Pulmonary/Chest: Effort normal and breath sounds normal.  Neurological: Pt is alert. Not confused  Skin: Skin is warm. No erythema.  Psychiatric: Pt behavior is normal. Thought content normal.     Assessment & Plan:

## 2012-08-22 NOTE — Assessment & Plan Note (Signed)
Mild to mod, for flonase asd,  to f/u any worsening symptoms or concerns  

## 2012-09-13 ENCOUNTER — Ambulatory Visit (INDEPENDENT_AMBULATORY_CARE_PROVIDER_SITE_OTHER): Payer: Medicare Other | Admitting: Internal Medicine

## 2012-09-13 ENCOUNTER — Encounter: Payer: Self-pay | Admitting: Internal Medicine

## 2012-09-13 VITALS — BP 126/73 | HR 50 | Ht 70.0 in | Wt 179.2 lb

## 2012-09-13 DIAGNOSIS — I10 Essential (primary) hypertension: Secondary | ICD-10-CM

## 2012-09-13 DIAGNOSIS — E7800 Pure hypercholesterolemia, unspecified: Secondary | ICD-10-CM

## 2012-09-13 DIAGNOSIS — I2510 Atherosclerotic heart disease of native coronary artery without angina pectoris: Secondary | ICD-10-CM

## 2012-09-13 DIAGNOSIS — N529 Male erectile dysfunction, unspecified: Secondary | ICD-10-CM

## 2012-09-13 DIAGNOSIS — I4891 Unspecified atrial fibrillation: Secondary | ICD-10-CM

## 2012-09-13 HISTORY — DX: Male erectile dysfunction, unspecified: N52.9

## 2012-09-13 MED ORDER — VARDENAFIL HCL 5 MG PO TABS: 5.0000 mg | ORAL_TABLET | ORAL | Status: DC | PRN

## 2012-09-13 NOTE — Patient Instructions (Addendum)
Your physician wants you to follow-up in: 6 months with Dr Court Joy will receive a reminder letter in the mail two months in advance. If you don't receive a letter, please call our office to schedule the follow-up appointment.  Your physician has recommended you make the following change in your medication:  1) Stop Imdur 2) Do not use NTG for 1 day after using Levitra  Your physician recommends that you return for lab work in: fasting lipid

## 2012-09-13 NOTE — Assessment & Plan Note (Signed)
He is stopping NTG and Imdur. He will try Levitra.

## 2012-09-13 NOTE — Assessment & Plan Note (Signed)
His blood pressure has been well-controlled. No change in medical therapy. 

## 2012-09-13 NOTE — Assessment & Plan Note (Signed)
He will continue Xarelto. No change in medical therapy.

## 2012-09-13 NOTE — Progress Notes (Signed)
HPI [name] returns today for followup. He is a 78 yo man with a h/o CAD, dyslipidemia, and atrial flutter. In the interim he has done well. He denies chest pain or sob. He c/o ED. He denies peripheral edema or chest pain. Allergies  Allergen Reactions  . Other Nausea And Vomiting    Seafood-diarrhea  . Doxycycline Hyclate Other (See Comments)    esophagitis  . Lipitor (Atorvastatin Calcium) Other (See Comments)    Myalgia/myopathy  . Promethazine Hcl Other (See Comments)    syncope  . Tetracycline Other (See Comments)    Esophagitis  . Tetanus Toxoid Rash     Current Outpatient Prescriptions  Medication Sig Dispense Refill  . allopurinol (ZYLOPRIM) 300 MG tablet Take 1 tablet (300 mg total) by mouth daily.  90 tablet  3  . amiodarone (PACERONE) 100 MG tablet Take 1 tablet (100 mg total) by mouth daily.  90 tablet  3  . aspirin EC 81 MG tablet Take 1 tablet (81 mg total) by mouth daily.      . ferrous sulfate 325 (65 FE) MG tablet Take 1 tablet (325 mg total) by mouth daily with breakfast.      . fluticasone (FLONASE) 50 MCG/ACT nasal spray Place 2 sprays into the nose daily.  16 g  4  . levothyroxine (SYNTHROID, LEVOTHROID) 100 MCG tablet Take 1 tablet (100 mcg total) by mouth daily.  90 tablet  3  . Multiple Vitamin (MULTIVITAMIN) tablet Take 1 tablet by mouth at bedtime.       . nitroGLYCERIN (NITROSTAT) 0.4 MG SL tablet Place 1 tablet (0.4 mg total) under the tongue every 5 (five) minutes as needed for chest pain.  25 tablet  3  . pantoprazole (PROTONIX) 40 MG tablet Take 1 tablet (40 mg total) by mouth daily.  90 tablet  3  . Rivaroxaban (XARELTO) 20 MG TABS Take 1 tablet (20 mg total) by mouth daily.  90 tablet  3  . tiotropium (SPIRIVA HANDIHALER) 18 MCG inhalation capsule Place 1 capsule (18 mcg total) into inhaler and inhale daily.  90 capsule  3  . vardenafil (LEVITRA) 5 MG tablet Take 1 tablet (5 mg total) by mouth as needed for erectile dysfunction.  10 tablet  3   No  current facility-administered medications for this visit.     Past Medical History  Diagnosis Date  . Hypothyroidism 12/05/2008  . HYPERLIPIDEMIA 09/19/2008  . GOUT 09/19/2008  . ANEMIA-IRON DEFICIENCY 09/19/2008  . HYPERTENSION 09/19/2008  . CAD 12/05/2008    s/p CABG; NSTEMI 05/2011 - LHC 05/25/11: LAD occluded, LIMA-LAD patent, ostial circumflex 50%, AV circumflex 70%, SVG-OM2 with extensive disease with thrombus (culprit vessel), RCA 80% and occluded, SVG-intermediate patent, SVG-PDA/PL A patent.  Given that the culprit vessel was a subtotally occluded heavily thrombotic graft to a smaller OM2, medical therapy was recommended.;   . Atrial fibrillation 04/19/2008    amiodarone rx;  Echocardiogram 05/26/11: No wall motion abnormalities, mild LVH, EF 60%.  . Atrial flutter 12/05/2008    s/p RFCA  . BRADYCARDIA 12/05/2008  . AAA 09/19/2008  . PERIPHERAL VASCULAR DISEASE 09/19/2008  . GERD 09/19/2008  . DIVERTICULOSIS, COLON 09/19/2008  . RENAL INSUFFICIENCY 09/19/2008  . LOW BACK PAIN 09/19/2008  . PSA, INCREASED 09/19/2008  . COLONIC POLYPS, HX OF 09/19/2008    adenomatous polyps 07/2010  . GASTROINTESTINAL HEMORRHAGE, HX OF 04/19/2008  . NEPHROLITHIASIS, HX OF 01/11/2009  . CORONARY ARTERY BYPASS GRAFT, HX OF 12/05/2008  A. LIMA-LAD, VG-RI, VG-OM2, VG-RPD/RPL;  B. 05/2011 - NSTEMI - CATH WITH 4/5 PATENT GRAFTS AND NEW THROMBUS IN DISTAL VG-OM2 - MED RX  . ANXIETY 06/13/2010  . Arthritis   . COPD (chronic obstructive pulmonary disease)   . Myocardial infarction   . Allergy   . Benign esophageal stricture 09/2075    dilated during EGD    ROS:   All systems reviewed and negative except as noted in the HPI.   Past Surgical History  Procedure Laterality Date  . Aorto-femoral bypass x5 - stress test neg 9/08    . Coronary artery bypass graft    . S/p afib ablation  2004  . Cardiac catheterization  05/25/2011  . Colon surgery    . Esophagogastroduodenoscopy  04/28/2012    Procedure: ESOPHAGOGASTRODUODENOSCOPY  (EGD);  Surgeon: Louis Meckel, MD;  Location: [facility] ENDOSCOPY;  Service: Endoscopy;  Laterality: N/A;     Family History  Problem Relation Age of Onset  . Diabetes Father   . Diabetes Sister   . Multiple myeloma Mother   . Cancer Mother   . Other Mother     varicose veins  . Heart disease Paternal Uncle   . Heart disease Maternal Uncle   . Lymphoma Sister     half-sister     History   Social History  . Marital Status: Married    Spouse Name: Omar Benson    Number of Children: N/A  . Years of Education: N/A   Occupational History  . self employeed - Contractor    Social History Main Topics  . Smoking status: Former Smoker -- 4.00 packs/day for 31 years    Types: Cigarettes    Quit date: 03/19/1983  . Smokeless tobacco: Never Used  . Alcohol Use: 6.0 oz/week    10 Shots of liquor per week  . Drug Use: No  . Sexually Active: Yes   Other Topics Concern  . Not on file   Social History Narrative   Married lives with his wife     BP 126/73  Pulse 50  Ht 5\' 10"  (1.778 m)  Wt 179 lb 3.2 oz (81.285 kg)  BMI 25.71 kg/m2  Physical Exam:  Well appearing 78 yo man, NAD HEENT: Unremarkable Neck:  No JVD, no thyromegally Lungs:  Clear with no wheezes, rales, or rhonchi HEART:  Regular rate rhythm, no murmurs, no rubs, no clicks Abd:  soft, positive bowel sounds, no organomegally, no rebound, no guarding Ext:  2 plus pulses, no edema, no cyanosis, no clubbing Skin:  No rashes no nodules Neuro:  CN II through XII intact, motor grossly intact  EKG - sinus bradycardia  Assess/Plan:

## 2012-09-13 NOTE — Assessment & Plan Note (Signed)
He denies anginal symptoms. I have asked him to stop imdur. He is interested in treatment for his ED. I have specifically instructed him to not take Imdur or slNTG if he is taking Levitra.

## 2012-09-15 ENCOUNTER — Other Ambulatory Visit (INDEPENDENT_AMBULATORY_CARE_PROVIDER_SITE_OTHER): Payer: Medicare Other

## 2012-09-15 DIAGNOSIS — E7800 Pure hypercholesterolemia, unspecified: Secondary | ICD-10-CM

## 2012-09-15 LAB — LIPID PANEL
Cholesterol: 155 mg/dL (ref 0–200)
HDL: 43.2 mg/dL (ref >39.00)
LDL Cholesterol: 83 mg/dL (ref 0–99)
Total CHOL/HDL Ratio: 4
Triglycerides: 143 mg/dL (ref 0.0–149.0)
VLDL: 28.6 mg/dL (ref 0.0–40.0)

## 2012-11-23 ENCOUNTER — Ambulatory Visit (INDEPENDENT_AMBULATORY_CARE_PROVIDER_SITE_OTHER): Payer: Medicare Other | Admitting: Pulmonary Disease

## 2012-11-23 ENCOUNTER — Encounter: Payer: Self-pay | Admitting: Pulmonary Disease

## 2012-11-23 ENCOUNTER — Ambulatory Visit (INDEPENDENT_AMBULATORY_CARE_PROVIDER_SITE_OTHER)
Admission: RE | Admit: 2012-11-23 | Discharge: 2012-11-23 | Disposition: A | Discharge status: Home / Self Care | Payer: Medicare Other | Source: Ambulatory Visit | Attending: Pulmonary Disease | Admitting: Pulmonary Disease

## 2012-11-23 VITALS — BP 132/80 | HR 54 | Temp 98.0°F | Ht 68.5 in | Wt 181.2 lb

## 2012-11-23 DIAGNOSIS — J449 Chronic obstructive pulmonary disease, unspecified: Secondary | ICD-10-CM

## 2012-11-23 MED ORDER — ALBUTEROL SULFATE HFA 108 (90 BASE) MCG/ACT IN AERS: 2.0000 | INHALATION_SPRAY | Freq: Four times a day (QID) | RESPIRATORY_TRACT | Status: DC | PRN

## 2012-11-23 MED ORDER — PREDNISONE 10 MG PO TABS: ORAL_TABLET | ORAL | Status: DC

## 2012-11-23 NOTE — Patient Instructions (Addendum)
Stay on spiriva daily, and we will give you a prescription for a rescue inhaler to have available . Will check chest xray today, and call you with results. Will start on prednisone over the next 8 days.  If you do not see any change in your breathing, I would be concerned your symptoms are coming more from your heart.  Please call me in 10 days and give update.

## 2012-11-23 NOTE — Addendum Note (Signed)
Addended by: Nita Sells on: 11/23/2012 12:03 PM   Modules accepted: Orders

## 2012-11-23 NOTE — Assessment & Plan Note (Signed)
The patient has a history of COPD which has done extremely well on Spiriva alone.  The patient has experienced excellent exertional tolerance in the past on this medication, but now over the last month has noticed increasing shortness of breath and a pounding of his heart beat with both heavier and mild to moderate exertional activities.  He has a history of an atrial arrhythmia, but does not feel this is the issue.  He also has a history of coronary disease.  At this point, I would like to treat him with a short course of prednisone to see if this may be a mild COPD exacerbation.  If he sees no change in his breathing, then obviously his symptoms are probably not related to his underlying obstructive lung disease.  We'll also check a chest x-ray today for completeness since he is taking amiodarone.

## 2012-11-23 NOTE — Progress Notes (Signed)
  Subjective:    Patient ID: Omar Benson, male    DOB: Apr 25, 1935, 78 y.o.   MRN: [id number]  HPI The patient comes in today for followup of his known mild COPD.  He has done extremely well in the past with Spiriva alone, and has excellent exertional tolerance.  However, over the last one month he has noticed increasing shortness of breath and a pounding of his heart with heavier exertional activities at first, and now it is occurring with mild to moderate activity.  He does not have a rescue inhaler to use.  The patient has a known atrial arrhythmia and coronary disease, but he does not think he has been in atrial fibrillation or flutter.  He is also on amiodarone, but chest x-rays in the past have been unremarkable, and his diffusion capacity has always been normal.  He denies any chest congestion, cough, mucus production, or anginal like chest pain.  He denies any pleuritic chest pain.   Review of Systems  Constitutional: Negative for fever and unexpected weight change.  HENT: Negative for ear pain, nosebleeds, congestion, sore throat, rhinorrhea, sneezing, trouble swallowing, dental problem, postnasal drip and sinus pressure.   Eyes: Negative for redness and itching.  Respiratory: Negative for cough, chest tightness, shortness of breath and wheezing.   Cardiovascular: Negative for palpitations and leg swelling.  Gastrointestinal: Negative for nausea and vomiting.  Genitourinary: Negative for dysuria.  Musculoskeletal: Negative for joint swelling.  Skin: Negative for rash.  Neurological: Negative for headaches.  Hematological: Does not bruise/bleed easily.  Psychiatric/Behavioral: Negative for dysphoric mood. The patient is not nervous/anxious.        Objective:   Physical Exam Well-developed male in no acute distress Nose without purulence or discharge noted Neck without lymphadenopathy or thyromegaly Chest with very mild decreased breath sounds, no active wheezing or rhonchi. Cardiac  exam with regular rate and rhythm, 2/6 systolic murmur Lower extremities without edema, no cyanosis Alert and oriented, moves all 4 extremities.       Assessment & Plan:

## 2012-12-21 ENCOUNTER — Other Ambulatory Visit: Payer: Self-pay

## 2012-12-22 ENCOUNTER — Encounter: Payer: Self-pay | Admitting: Internal Medicine

## 2012-12-22 ENCOUNTER — Ambulatory Visit (INDEPENDENT_AMBULATORY_CARE_PROVIDER_SITE_OTHER): Payer: Medicare Other | Admitting: Internal Medicine

## 2012-12-22 ENCOUNTER — Other Ambulatory Visit (INDEPENDENT_AMBULATORY_CARE_PROVIDER_SITE_OTHER): Payer: Medicare Other

## 2012-12-22 VITALS — BP 132/80 | HR 53 | Temp 98.0°F | Wt 182.2 lb

## 2012-12-22 DIAGNOSIS — Z0000 Encounter for general adult medical examination without abnormal findings: Secondary | ICD-10-CM

## 2012-12-22 DIAGNOSIS — I4892 Unspecified atrial flutter: Secondary | ICD-10-CM

## 2012-12-22 DIAGNOSIS — E785 Hyperlipidemia, unspecified: Secondary | ICD-10-CM

## 2012-12-22 LAB — BASIC METABOLIC PANEL
CO2: 27 mEq/L (ref 19–32)
Calcium: 9.5 mg/dL (ref 8.4–10.5)
Potassium: 4.9 mEq/L (ref 3.5–5.1)
Sodium: 138 mEq/L (ref 135–145)

## 2012-12-22 LAB — CBC WITH DIFFERENTIAL/PLATELET
Basophils Absolute: 0 10*3/uL (ref 0.0–0.1)
Eosinophils Absolute: 0.2 10*3/uL (ref 0.0–0.7)
HCT: 41.1 % (ref 39.0–52.0)
Hemoglobin: 13.7 g/dL (ref 13.0–17.0)
Lymphocytes Relative: 22.9 % (ref 12.0–46.0)
Lymphs Abs: 1 10*3/uL (ref 0.7–4.0)
MCHC: 33.3 g/dL (ref 30.0–36.0)
Neutro Abs: 2.8 10*3/uL (ref 1.4–7.7)
RDW: 13.9 % (ref 11.5–14.6)

## 2012-12-22 LAB — LDL CHOLESTEROL, DIRECT: Direct LDL: 111.3 mg/dL

## 2012-12-22 LAB — HEPATIC FUNCTION PANEL
Alkaline Phosphatase: 78 U/L (ref 39–117)
Bilirubin, Direct: 0.2 mg/dL (ref 0.0–0.3)
Total Protein: 6.8 g/dL (ref 6.0–8.3)

## 2012-12-22 LAB — URINALYSIS, ROUTINE W REFLEX MICROSCOPIC
Hgb urine dipstick: NEGATIVE
Ketones, ur: NEGATIVE
Urine Glucose: NEGATIVE
Urobilinogen, UA: 0.2 (ref 0.0–1.0)

## 2012-12-22 LAB — LIPID PANEL
Total CHOL/HDL Ratio: 5
Triglycerides: 255 mg/dL — ABNORMAL HIGH (ref 0.0–149.0)

## 2012-12-22 MED ORDER — ALPRAZOLAM 0.25 MG PO TABS: 0.2500 mg | ORAL_TABLET | Freq: Two times a day (BID) | ORAL | Status: DC | PRN

## 2012-12-22 NOTE — Progress Notes (Signed)
Subjective:    Patient ID: Omar Benson, male    DOB: 1935-03-12, 78 y.o.   MRN: [id number]  HPI  Here for wellness and f/u;  Overall doing ok;  Pt denies CP, worsening SOB, DOE, wheezing, orthopnea, PND, worsening LE edema, palpitations, dizziness or syncope.  Pt denies neurological change such as new headache, facial or extremity weakness.  Pt denies polydipsia, polyuria, or low sugar symptoms. Pt states overall good compliance with treatment and medications, good tolerability, and has been trying to follow lower cholesterol diet.  Pt denies worsening depressive symptoms, suicidal ideation or panic, but asks for refill low dose sedative for recurring tenseness.. No fever, night sweats, wt loss, loss of appetite, or other constitutional symptoms.  Pt states good ability with ADL's, has low fall risk, home safety reviewed and adequate, no other significant changes in hearing or vision, and only occasionally active with exercise.  No acute complaints.   Past Medical History  Diagnosis Date  . Hypothyroidism 12/05/2008  . HYPERLIPIDEMIA 09/19/2008  . GOUT 09/19/2008  . ANEMIA-IRON DEFICIENCY 09/19/2008  . HYPERTENSION 09/19/2008  . CAD 12/05/2008    s/p CABG; NSTEMI 05/2011 - LHC 05/25/11: LAD occluded, LIMA-LAD patent, ostial circumflex 50%, AV circumflex 70%, SVG-OM2 with extensive disease with thrombus (culprit vessel), RCA 80% and occluded, SVG-intermediate patent, SVG-PDA/PL A patent.  Given that the culprit vessel was a subtotally occluded heavily thrombotic graft to a smaller OM2, medical therapy was recommended.;   . Atrial fibrillation 04/19/2008    amiodarone rx;  Echocardiogram 05/26/11: No wall motion abnormalities, mild LVH, EF 60%.  . Atrial flutter 12/05/2008    s/p RFCA  . BRADYCARDIA 12/05/2008  . AAA 09/19/2008  . PERIPHERAL VASCULAR DISEASE 09/19/2008  . GERD 09/19/2008  . DIVERTICULOSIS, COLON 09/19/2008  . RENAL INSUFFICIENCY 09/19/2008  . LOW BACK PAIN 09/19/2008  . PSA, INCREASED 09/19/2008  . COLONIC  POLYPS, HX OF 09/19/2008    adenomatous polyps 07/2010  . GASTROINTESTINAL HEMORRHAGE, HX OF 04/19/2008  . NEPHROLITHIASIS, HX OF 01/11/2009  . CORONARY ARTERY BYPASS GRAFT, HX OF 12/05/2008    A. LIMA-LAD, VG-RI, VG-OM2, VG-RPD/RPL;  B. 05/2011 - NSTEMI - CATH WITH 4/5 PATENT GRAFTS AND NEW THROMBUS IN DISTAL VG-OM2 - MED RX  . ANXIETY 06/13/2010  . Arthritis   . COPD (chronic obstructive pulmonary disease)   . Myocardial infarction   . Allergy   . Benign esophageal stricture 09/2075    dilated during EGD  . Erectile dysfunction 09/13/2012   Past Surgical History  Procedure Laterality Date  . Aorto-femoral bypass x5 - stress test neg 9/08    . Coronary artery bypass graft    . S/p afib ablation  2004  . Cardiac catheterization  05/25/2011  . Colon surgery    . Esophagogastroduodenoscopy  04/28/2012    Procedure: ESOPHAGOGASTRODUODENOSCOPY (EGD);  Surgeon: Louis Meckel, MD;  Location: [facility] ENDOSCOPY;  Service: Endoscopy;  Laterality: N/A;    reports that he quit smoking about 29 years ago. His smoking use included Cigarettes. He has a 124 pack-year smoking history. He has never used smokeless tobacco. He reports that he drinks about 6.0 ounces of alcohol per week. He reports that he does not use illicit drugs. family history includes Cancer in his mother; Diabetes in his father and sister; Heart disease in his maternal uncle and paternal uncle; Lymphoma in his sister; Multiple myeloma in his mother; and Other in his mother. Allergies  Allergen Reactions  . Other Nausea And Vomiting  Seafood-diarrhea  . Doxycycline Hyclate Other (See Comments)    esophagitis  . Lipitor (Atorvastatin Calcium) Other (See Comments)    Myalgia/myopathy  . Promethazine Hcl Other (See Comments)    syncope  . Tetracycline Other (See Comments)    Esophagitis  . Tetanus Toxoid Rash   Current Outpatient Prescriptions on File Prior to Visit  Medication Sig Dispense Refill  . albuterol (PROVENTIL HFA;VENTOLIN  HFA) 108 (90 BASE) MCG/ACT inhaler Inhale 2 puffs into the lungs every 6 (six) hours as needed for wheezing.  1 Inhaler  6  . allopurinol (ZYLOPRIM) 300 MG tablet Take 1 tablet (300 mg total) by mouth daily.  90 tablet  3  . amiodarone (PACERONE) 100 MG tablet Take 1 tablet (100 mg total) by mouth daily.  90 tablet  3  . aspirin EC 81 MG tablet Take 1 tablet (81 mg total) by mouth daily.      . ferrous sulfate 325 (65 FE) MG tablet Take 325 mg by mouth daily.      . fluticasone (FLONASE) 50 MCG/ACT nasal spray Place 2 sprays into the nose daily.  16 g  4  . isosorbide mononitrate (IMDUR) 30 MG 24 hr tablet Take 1 tablet by mouth daily.      Marland Kitchen levothyroxine (SYNTHROID, LEVOTHROID) 100 MCG tablet Take 1 tablet (100 mcg total) by mouth daily.  90 tablet  3  . Multiple Vitamin (MULTIVITAMIN) tablet Take 1 tablet by mouth at bedtime.       . pantoprazole (PROTONIX) 40 MG tablet Take 1 tablet (40 mg total) by mouth daily.  90 tablet  3  . Rivaroxaban (XARELTO) 20 MG TABS Take 1 tablet (20 mg total) by mouth daily.  90 tablet  3  . tiotropium (SPIRIVA HANDIHALER) 18 MCG inhalation capsule Place 1 capsule (18 mcg total) into inhaler and inhale daily.  90 capsule  3  . vardenafil (LEVITRA) 5 MG tablet Take 1 tablet (5 mg total) by mouth as needed for erectile dysfunction.  10 tablet  3  . nitroGLYCERIN (NITROSTAT) 0.4 MG SL tablet Place 1 tablet (0.4 mg total) under the tongue every 5 (five) minutes as needed for chest pain.  25 tablet  3   No current facility-administered medications on file prior to visit.   Review of Systems Constitutional: Negative for diaphoresis, activity change, appetite change or unexpected weight change.  HENT: Negative for hearing loss, ear pain, facial swelling, mouth sores and neck stiffness.   Eyes: Negative for pain, redness and visual disturbance.  Respiratory: Negative for shortness of breath and wheezing.   Cardiovascular: Negative for chest pain and palpitations.   Gastrointestinal: Negative for diarrhea, blood in stool, abdominal distention or other pain Genitourinary: Negative for hematuria, flank pain or change in urine volume.  Musculoskeletal: Negative for myalgias and joint swelling.  Skin: Negative for color change and wound.  Neurological: Negative for syncope and numbness. other than noted Hematological: Negative for adenopathy.  Psychiatric/Behavioral: Negative for hallucinations, self-injury, decreased concentration and agitation.      Objective:   Physical Exam BP 132/80  Pulse 53  Temp(Src) 98 F (36.7 C) (Oral)  Wt 182 lb 4 oz (82.668 kg)  BMI 27.3 kg/m2  SpO2 97% VS noted,  Constitutional: Pt is oriented to person, place, and time. Appears well-developed and well-nourished.  Head: Normocephalic and atraumatic.  Right Ear: External ear normal.  Left Ear: External ear normal.  Nose: Nose normal.  Mouth/Throat: Oropharynx is clear and moist.  Eyes: Conjunctivae  and EOM are normal. Pupils are equal, round, and reactive to light.  Neck: Normal range of motion. Neck supple. No JVD present. No tracheal deviation present.  Cardiovascular: Normal rate, regular rhythm, normal heart sounds and intact distal pulses.   Pulmonary/Chest: Effort normal and breath sounds normal.  Abdominal: Soft. Bowel sounds are normal. There is no tenderness. No HSM  Musculoskeletal: Normal range of motion. Exhibits no edema.  Lymphadenopathy:  Has no cervical adenopathy.  Neurological: Pt is alert and oriented to person, place, and time. Pt has normal reflexes. No cranial nerve deficit.  Skin: Skin is warm and dry. No rash noted.  Psychiatric:  Has  normal mood and affect. Behavior is normal.  Except mild nervous    Assessment & Plan:

## 2012-12-22 NOTE — Patient Instructions (Signed)
Your EKG was OK today Please take all new medication as prescribed - the alprazolam (xanax) Please continue all other medications as before, and refills have been done if requested. Please continue your efforts at being more active, low cholesterol diet, and weight control. You are otherwise up to date with prevention measures today. Please call or return if you change your mind about the immunizations Please keep your appointments with your specialists as you have planned  - Dr [name] Please go to the LAB in the Basement (turn left off the elevator) for the tests to be done today You will be contacted by phone if any changes need to be made immediately.  Otherwise, you will receive a letter about your results with an explanation, but please check with MyChart first.  Please remember to sign up for My Chart if you have not done so, as this will be important to you in the future with finding out test results, communicating by private email, and scheduling acute appointments online when needed.  Please return in 6 months, or sooner if needed

## 2012-12-22 NOTE — Assessment & Plan Note (Signed)

## 2012-12-22 NOTE — Addendum Note (Signed)
Addended by: Corwin Levins on: 12/22/2012 01:52 PM   Modules accepted: Orders

## 2013-01-11 ENCOUNTER — Other Ambulatory Visit: Payer: Self-pay | Admitting: Internal Medicine

## 2013-01-11 ENCOUNTER — Ambulatory Visit: Payer: Medicare Other | Admitting: Pulmonary Disease

## 2013-02-01 ENCOUNTER — Ambulatory Visit (INDEPENDENT_AMBULATORY_CARE_PROVIDER_SITE_OTHER): Payer: Medicare Other | Admitting: Family Medicine

## 2013-02-01 DIAGNOSIS — T6391XA Toxic effect of contact with unspecified venomous animal, accidental (unintentional), initial encounter: Secondary | ICD-10-CM

## 2013-02-01 MED ORDER — EPINEPHRINE 0.3 MG/0.3ML IJ SOAJ: 0.3000 mg | Freq: Once | INTRAMUSCULAR | Status: DC

## 2013-02-01 NOTE — Patient Instructions (Addendum)
You can try taking benadryl or using a topical benadryl cream. Try taking a 1/2 dose of benadryl first.  Remember it can make you drowsy.  If you change your mind and do want to try some prednisone please let me know.    I will give you an rx for an epipen to have on hand.  If you start to have any dangerous swelling of your mouth, lips or tongue and cannot breathe, use the pen and then call 911.

## 2013-02-01 NOTE — Progress Notes (Addendum)
[facility] [address] [place] [address] [phone]  Date:  02/01/2013   Name:  Omar Benson   DOB:  03/15/1935   MRN:  [id number]  PCP:  Oliver Barre, MD    Chief Complaint: Insect Bite   History of Present Illness:  Omar Benson is a 78 y.o. very pleasant male patient who presents with the following:  Yesterday he was doing some work outside- he disturbed a yellow jacket nest and got stung.    He has 4 stings- on his left hand, left shoulder, right ear and his stomach.  He is mostly concerned about his right ear which is swollen.  It is also red.  The back of her left hand is swollen somewhat.  The other 2 stings are not very bothersome. He does not note any SOB, wheezing, or angioedema  He has an extensive cardiac history- he had CABG in 2004 and went into a fib after that- he has not since had this problem.   He did have a "minor heart attack" last year, treated medically with imdur. No stent needed.    He has a history of COPD and sees Dr. Shelle Iron.  He tried prednisone but he felt very hyper.  He does not wish to try this again. So far he has tried home remedies such as apple cider vinegar  Patient Active Problem List   Diagnosis Date Noted  . Erectile dysfunction 09/13/2012  . Acute sinus infection 08/22/2012  . Allergic rhinitis, cause unspecified 08/22/2012  . GI AVM (gastrointestinal arteriovenous vascular malformation) 04/28/2012  . Stricture and stenosis of esophagus 04/28/2012  . GI bleed 04/26/2012  . Non-STEMI (non-ST elevated myocardial infarction) 05/24/2011  . COPD (chronic obstructive pulmonary disease) 09/08/2010  . Hypothyroid 08/21/2010  . Preventative health care 08/21/2010  . GASTRITIS 07/21/2010  . ANXIETY 06/13/2010  . NEPHROLITHIASIS, HX OF 01/11/2009  . CAD 12/05/2008  . ATRIAL FLUTTER, CHRONIC 12/05/2008  . BRADYCARDIA 12/05/2008  . HYPERLIPIDEMIA 09/19/2008  . GOUT 09/19/2008  . ANEMIA-IRON DEFICIENCY 09/19/2008  .  HYPERTENSION 09/19/2008  . AAA 09/19/2008  . PERIPHERAL VASCULAR DISEASE 09/19/2008  . GERD 09/19/2008  . DIVERTICULOSIS, COLON 09/19/2008  . RENAL INSUFFICIENCY 09/19/2008  . LOW BACK PAIN 09/19/2008  . PSA, INCREASED 09/19/2008  . COLONIC POLYPS, HX OF 09/19/2008  . Atrial fibrillation 04/19/2008  . GASTROINTESTINAL HEMORRHAGE, HX OF 04/19/2008    Past Medical History  Diagnosis Date  . Hypothyroidism 12/05/2008  . HYPERLIPIDEMIA 09/19/2008  . GOUT 09/19/2008  . ANEMIA-IRON DEFICIENCY 09/19/2008  . HYPERTENSION 09/19/2008  . CAD 12/05/2008    s/p CABG; NSTEMI 05/2011 - LHC 05/25/11: LAD occluded, LIMA-LAD patent, ostial circumflex 50%, AV circumflex 70%, SVG-OM2 with extensive disease with thrombus (culprit vessel), RCA 80% and occluded, SVG-intermediate patent, SVG-PDA/PL A patent.  Given that the culprit vessel was a subtotally occluded heavily thrombotic graft to a smaller OM2, medical therapy was recommended.;   . Atrial fibrillation 04/19/2008    amiodarone rx;  Echocardiogram 05/26/11: No wall motion abnormalities, mild LVH, EF 60%.  . Atrial flutter 12/05/2008    s/p RFCA  . BRADYCARDIA 12/05/2008  . AAA 09/19/2008  . PERIPHERAL VASCULAR DISEASE 09/19/2008  . GERD 09/19/2008  . DIVERTICULOSIS, COLON 09/19/2008  . RENAL INSUFFICIENCY 09/19/2008  . LOW BACK PAIN 09/19/2008  . PSA, INCREASED 09/19/2008  . COLONIC POLYPS, HX OF 09/19/2008    adenomatous polyps 07/2010  . GASTROINTESTINAL HEMORRHAGE, HX OF 04/19/2008  . NEPHROLITHIASIS,  HX OF 01/11/2009  . CORONARY ARTERY BYPASS GRAFT, HX OF 12/05/2008    A. LIMA-LAD, VG-RI, VG-OM2, VG-RPD/RPL;  B. 05/2011 - NSTEMI - CATH WITH 4/5 PATENT GRAFTS AND NEW THROMBUS IN DISTAL VG-OM2 - MED RX  . ANXIETY 06/13/2010  . Arthritis   . COPD (chronic obstructive pulmonary disease)   . Myocardial infarction   . Allergy   . Benign esophageal stricture 09/2075    dilated during EGD  . Erectile dysfunction 09/13/2012    Past Surgical History  Procedure Laterality Date   . Aorto-femoral bypass x5 - stress test neg 9/08    . Coronary artery bypass graft    . S/p afib ablation  2004  . Cardiac catheterization  05/25/2011  . Colon surgery    . Esophagogastroduodenoscopy  04/28/2012    Procedure: ESOPHAGOGASTRODUODENOSCOPY (EGD);  Surgeon: Louis Meckel, MD;  Location[facility] ENDOSCOPY;  Service: Endoscopy;  Laterality: N/A;    History  Substance Use Topics  . Smoking status: Former Smoker -- 4.00 packs/day for 31 years    Types: Cigarettes    Quit date: 03/19/1983  . Smokeless tobacco: Never Used  . Alcohol Use: 6.0 oz/week    10 Shots of liquor per week    Family History  Problem Relation Age of Onset  . Diabetes Father   . Diabetes Sister   . Multiple myeloma Mother   . Cancer Mother   . Other Mother     varicose veins  . Heart disease Paternal Uncle   . Heart disease Maternal Uncle   . Lymphoma Sister     half-sister    Allergies  Allergen Reactions  . Other Nausea And Vomiting    Seafood-diarrhea  . Doxycycline Hyclate Other (See Comments)    esophagitis  . Lipitor [Atorvastatin Calcium] Other (See Comments)    Myalgia/myopathy  . Promethazine Hcl Other (See Comments)    syncope  . Tetracycline Other (See Comments)    Esophagitis  . Tetanus Toxoid Rash    Medication list has been reviewed and updated.  Current Outpatient Prescriptions on File Prior to Visit  Medication Sig Dispense Refill  . allopurinol (ZYLOPRIM) 300 MG tablet TAKE 1 TABLET BY MOUTH EVERY DAY  90 tablet  3  . amiodarone (PACERONE) 100 MG tablet Take 1 tablet (100 mg total) by mouth daily.  90 tablet  3  . aspirin EC 81 MG tablet Take 1 tablet (81 mg total) by mouth daily.      . ferrous sulfate 325 (65 FE) MG tablet Take 325 mg by mouth daily.      . isosorbide mononitrate (IMDUR) 30 MG 24 hr tablet Take 1 tablet by mouth daily.      . Multiple Vitamin (MULTIVITAMIN) tablet Take 1 tablet by mouth at bedtime.       . nitroGLYCERIN (NITROSTAT) 0.4 MG SL tablet  Place 1 tablet (0.4 mg total) under the tongue every 5 (five) minutes as needed for chest pain.  25 tablet  3  . pantoprazole (PROTONIX) 40 MG tablet Take 1 tablet (40 mg total) by mouth daily.  90 tablet  3  . Rivaroxaban (XARELTO) 20 MG TABS Take 1 tablet (20 mg total) by mouth daily.  90 tablet  3  . SYNTHROID 100 MCG tablet TAKE 1 TABLET BY MOUTH EVERY DAY  90 tablet  3  . tiotropium (SPIRIVA HANDIHALER) 18 MCG inhalation capsule Place 1 capsule (18 mcg total) into inhaler and inhale daily.  90 capsule  3  .  albuterol (PROVENTIL HFA;VENTOLIN HFA) 108 (90 BASE) MCG/ACT inhaler Inhale 2 puffs into the lungs every 6 (six) hours as needed for wheezing.  1 Inhaler  6  . ALPRAZolam (XANAX) 0.25 MG tablet Take 1 tablet (0.25 mg total) by mouth 2 (two) times daily as needed.  60 tablet  0  . fluticasone (FLONASE) 50 MCG/ACT nasal spray Place 2 sprays into the nose daily.  16 g  4  . vardenafil (LEVITRA) 5 MG tablet Take 1 tablet (5 mg total) by mouth as needed for erectile dysfunction.  10 tablet  3   No current facility-administered medications on file prior to visit.    Review of Systems:  As per HPI- otherwise negative.  Physical Examination: Filed Vitals:   02/01/13 0916  BP: 150/82  Pulse: 56  Temp: 97.8 F (36.6 C)  Resp: 18   Filed Vitals:   02/01/13 0916  Height: 5\' 9"  (1.753 m)  Weight: 185 lb (83.915 kg)   Body mass index is 27.31 kg/(m^2). Ideal Body Weight: Weight in (lb) to have BMI = 25: 168.9  GEN: WDWN, NAD, Non-toxic, A & O x 3, looks well HEENT: Atraumatic, Normocephalic. Neck supple. No masses, No LAD. Right external ear is somewhat swollen and erythematous  Normal TM and IAC bilaterally,  No lip or tongue swelling, no angioedema, PEERL, EOMI Ears and Nose: No external deformity. CV: RRR, No M/G/R. No JVD. No thrill. No extra heart sounds. PULM: CTA B, no wheezes, crackles, rhonchi. No retractions. No resp. distress. No accessory muscle use. EXTR: No c/c.  He  does have slight swelling on the dorsum of his left hand due to sting.   NEURO Normal gait.  PSYCH: Normally interactive. Conversant. Not depressed or anxious appearing.  Calm demeanor.  No hives or rashes  At last OV with Dr. Jonny Ruiz pulse was 53 Assessment and Plan: Bee sting, initial encounter - Plan: EPINEPHrine (EPIPEN) 0.3 mg/0.3 mL SOAJ injection  Discussed in detail with pt. At this time he does not appear to have any serious reaction.  Did rx an epipen to have on hand, and explained that this is to be used only in an anaphylaxis emergency.  He would like to try benadryl.  Let him know that we do need to be cautious with benadryl given his other health problems.  He will try topical benadryl first.  If not helpful he can cautiously try oral benadryl.  He will let me know if not better in the next couple of days- Sooner if worse.     Signed Abbe Amsterdam, MD

## 2013-03-01 ENCOUNTER — Ambulatory Visit (HOSPITAL_COMMUNITY): Payer: Medicare Other | Attending: Cardiology

## 2013-03-01 DIAGNOSIS — E785 Hyperlipidemia, unspecified: Secondary | ICD-10-CM | POA: Insufficient documentation

## 2013-03-01 DIAGNOSIS — I708 Atherosclerosis of other arteries: Secondary | ICD-10-CM | POA: Insufficient documentation

## 2013-03-01 DIAGNOSIS — N289 Disorder of kidney and ureter, unspecified: Secondary | ICD-10-CM | POA: Insufficient documentation

## 2013-03-01 DIAGNOSIS — Z951 Presence of aortocoronary bypass graft: Secondary | ICD-10-CM | POA: Insufficient documentation

## 2013-03-01 DIAGNOSIS — I10 Essential (primary) hypertension: Secondary | ICD-10-CM | POA: Insufficient documentation

## 2013-03-01 DIAGNOSIS — I739 Peripheral vascular disease, unspecified: Secondary | ICD-10-CM

## 2013-03-01 DIAGNOSIS — Z87891 Personal history of nicotine dependence: Secondary | ICD-10-CM | POA: Insufficient documentation

## 2013-03-01 DIAGNOSIS — J449 Chronic obstructive pulmonary disease, unspecified: Secondary | ICD-10-CM | POA: Insufficient documentation

## 2013-03-01 DIAGNOSIS — J4489 Other specified chronic obstructive pulmonary disease: Secondary | ICD-10-CM | POA: Insufficient documentation

## 2013-03-01 DIAGNOSIS — I714 Abdominal aortic aneurysm, without rupture, unspecified: Secondary | ICD-10-CM | POA: Insufficient documentation

## 2013-03-01 DIAGNOSIS — I2510 Atherosclerotic heart disease of native coronary artery without angina pectoris: Secondary | ICD-10-CM | POA: Insufficient documentation

## 2013-03-01 DIAGNOSIS — I700 Atherosclerosis of aorta: Secondary | ICD-10-CM

## 2013-03-23 ENCOUNTER — Other Ambulatory Visit: Payer: Self-pay

## 2013-04-08 ENCOUNTER — Other Ambulatory Visit: Payer: Self-pay | Admitting: Internal Medicine

## 2013-04-14 IMAGING — CR DG CHEST 1V PORT
1 series · 1 of 1 positions shown · non-contrast
Comparison: [HOSPITAL] CT chest dated 09/22/2010

CLINICAL DATA: Chest pain

PORTABLE CHEST - 1 VIEW

[view not recorded]
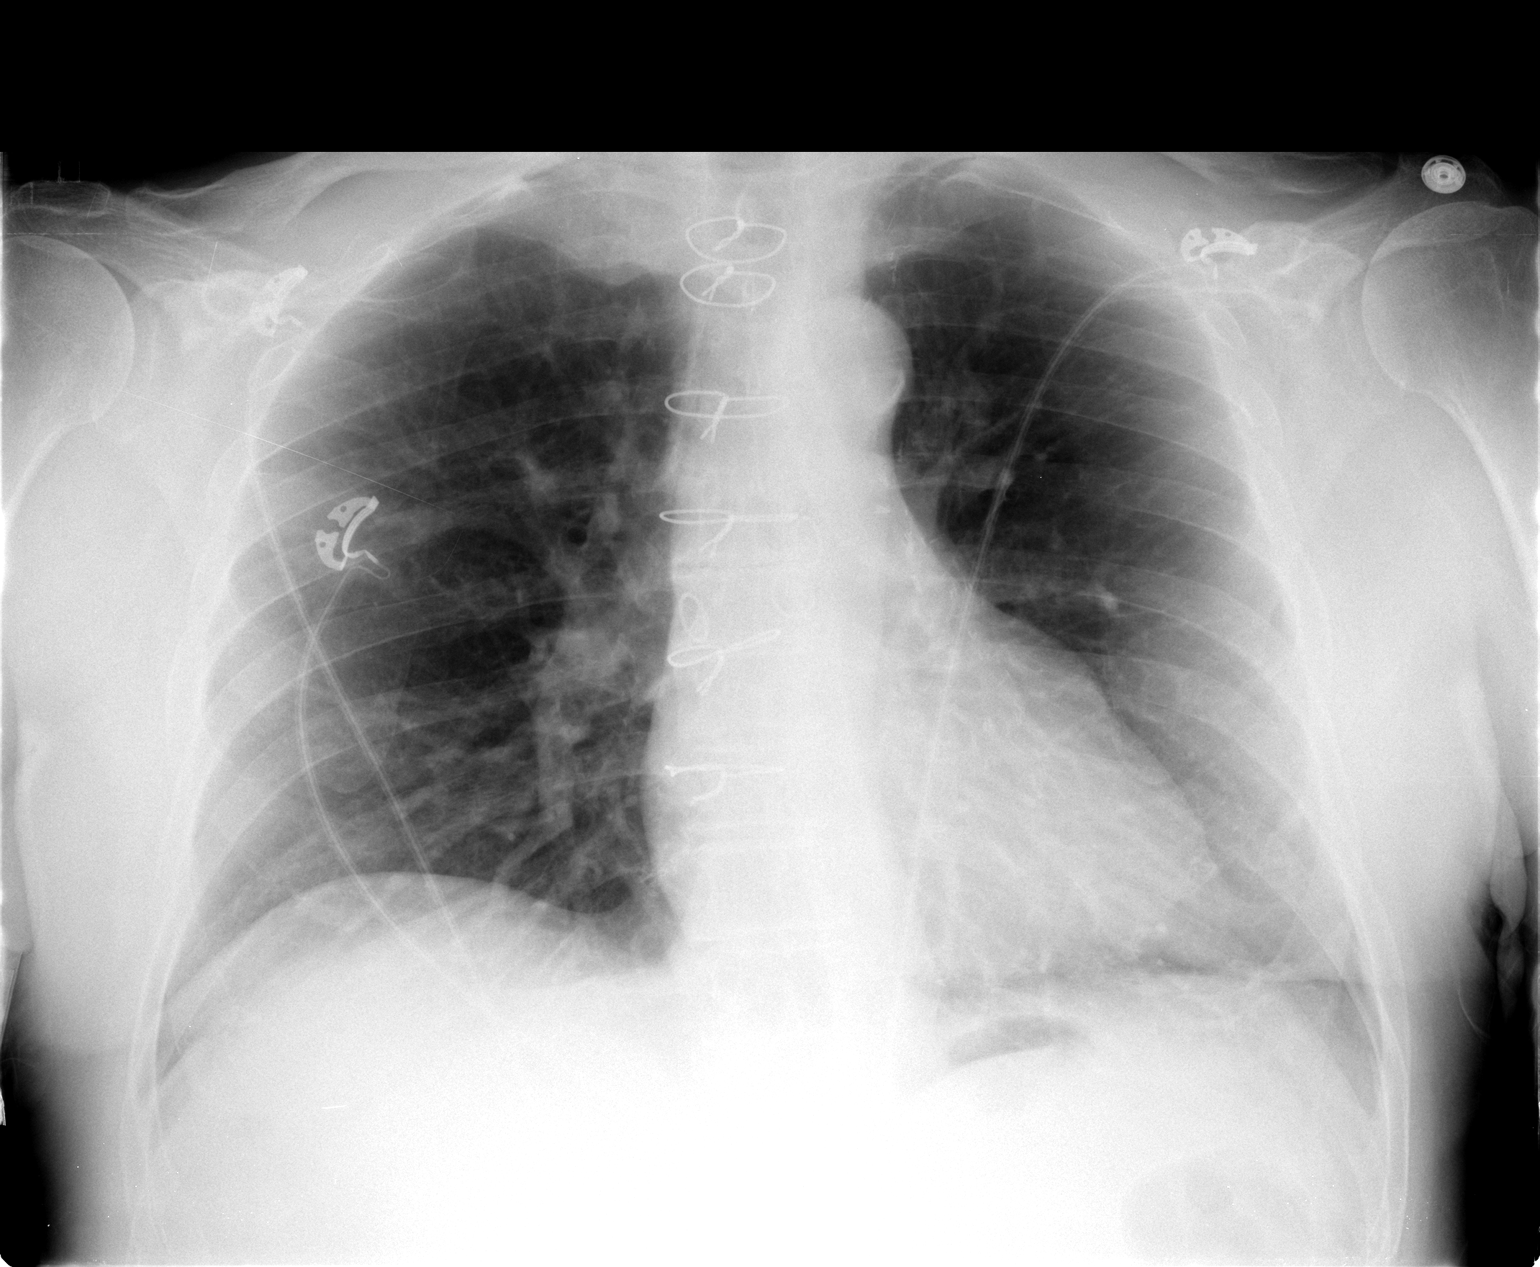

[1 of 1 positions shown; findings below may reference images not displayed]

FINDINGS: Lungs are clear. No pleural effusion or pneumothorax.

The heart is top normal in size. Postsurgical changes related to
prior CABG.
IMPRESSION: No evidence of acute cardiopulmonary disease.

## 2013-04-25 ENCOUNTER — Encounter: Payer: Self-pay | Admitting: Internal Medicine

## 2013-04-25 ENCOUNTER — Ambulatory Visit (INDEPENDENT_AMBULATORY_CARE_PROVIDER_SITE_OTHER): Payer: Medicare Other | Admitting: Internal Medicine

## 2013-04-25 VITALS — BP 162/85 | HR 55 | Ht 69.0 in | Wt 184.8 lb

## 2013-04-25 DIAGNOSIS — N529 Male erectile dysfunction, unspecified: Secondary | ICD-10-CM

## 2013-04-25 DIAGNOSIS — I4891 Unspecified atrial fibrillation: Secondary | ICD-10-CM

## 2013-04-25 DIAGNOSIS — I10 Essential (primary) hypertension: Secondary | ICD-10-CM

## 2013-04-25 MED ORDER — RIVAROXABAN 20 MG PO TABS: 20.0000 mg | ORAL_TABLET | Freq: Every day | ORAL | Status: DC

## 2013-04-25 NOTE — Assessment & Plan Note (Signed)
We discussed the details of this and I offered him discontinuation of long-acting nitrate therapy and reinitiation of ED medications. He is considering other options.

## 2013-04-25 NOTE — Patient Instructions (Signed)
Your physician wants you to follow-up in: 12 months with Dr. Taylor. You will receive a reminder letter in the mail two months in advance. If you don't receive a letter, please call our office to schedule the follow-up appointment.    

## 2013-04-25 NOTE — Assessment & Plan Note (Signed)
His blood pressure is elevated today but notes it at home it is been under much better control. He has had difficulty with beta blockers in the past and calcium channel blockers.

## 2013-04-25 NOTE — Progress Notes (Signed)
HPI Omar Benson returns today for followup. He is a very pleasant 78 year old man with ischemic heart disease, status post bypass surgery, status post stenting, who also has a history of atrial flutter status post catheter ablation and a questionable history of atrial fibrillation. In the interim, the patient has done well. He denies chest pain or shortness of breath. No syncope. No palpitations. Allergies  Allergen Reactions  . Bee Venom Anaphylaxis    Only yellow jackets   . Other Nausea And Vomiting    Seafood-diarrhea  . Doxycycline Hyclate Other (See Comments)    esophagitis  . Lipitor [Atorvastatin Calcium] Other (See Comments)    Myalgia/myopathy  . Promethazine Hcl Other (See Comments)    syncope  . Tetracycline Other (See Comments)    Esophagitis  . Tetanus Toxoid Rash     Current Outpatient Prescriptions  Medication Sig Dispense Refill  . albuterol (PROVENTIL HFA;VENTOLIN HFA) 108 (90 BASE) MCG/ACT inhaler Inhale 2 puffs into the lungs every 6 (six) hours as needed for wheezing.  1 Inhaler  6  . allopurinol (ZYLOPRIM) 300 MG tablet TAKE 1 TABLET BY MOUTH EVERY DAY  90 tablet  3  . ALPRAZolam (XANAX) 0.25 MG tablet Take 1 tablet (0.25 mg total) by mouth 2 (two) times daily as needed.  60 tablet  0  . amiodarone (PACERONE) 100 MG tablet Take 1 tablet (100 mg total) by mouth daily.  90 tablet  3  . aspirin EC 81 MG tablet Take 1 tablet (81 mg total) by mouth daily.      Marland Kitchen EPINEPHrine (EPIPEN) 0.3 mg/0.3 mL SOAJ injection Inject 0.3 mLs (0.3 mg total) into the muscle once.  2 Device  2  . ferrous sulfate 325 (65 FE) MG tablet Take 325 mg by mouth daily.      . isosorbide mononitrate (IMDUR) 30 MG 24 hr tablet Take 1 tablet by mouth daily.      . Multiple Vitamin (MULTIVITAMIN) tablet Take 1 tablet by mouth at bedtime.       . nitroGLYCERIN (NITROSTAT) 0.4 MG SL tablet Place 1 tablet (0.4 mg total) under the tongue every 5 (five) minutes as needed for chest pain.  25 tablet   3  . pantoprazole (PROTONIX) 40 MG tablet Take 1 tablet (40 mg total) by mouth daily.  90 tablet  3  . Rivaroxaban (XARELTO) 20 MG TABS tablet Take 1 tablet (20 mg total) by mouth daily with supper.  90 tablet  3  . SYNTHROID 100 MCG tablet TAKE 1 TABLET BY MOUTH EVERY DAY  90 tablet  3  . tiotropium (SPIRIVA HANDIHALER) 18 MCG inhalation capsule Place 1 capsule (18 mcg total) into inhaler and inhale daily.  90 capsule  3   No current facility-administered medications for this visit.     Past Medical History  Diagnosis Date  . Hypothyroidism 12/05/2008  . HYPERLIPIDEMIA 09/19/2008  . GOUT 09/19/2008  . ANEMIA-IRON DEFICIENCY 09/19/2008  . HYPERTENSION 09/19/2008  . CAD 12/05/2008    s/p CABG; NSTEMI 05/2011 - LHC 05/25/11: LAD occluded, LIMA-LAD patent, ostial circumflex 50%, AV circumflex 70%, SVG-OM2 with extensive disease with thrombus (culprit vessel), RCA 80% and occluded, SVG-intermediate patent, SVG-PDA/PL A patent.  Given that the culprit vessel was a subtotally occluded heavily thrombotic graft to a smaller OM2, medical therapy was recommended.;   . Atrial fibrillation 04/19/2008    amiodarone rx;  Echocardiogram 05/26/11: No wall motion abnormalities, mild LVH, EF 60%.  . Atrial flutter  12/05/2008    s/p RFCA  . BRADYCARDIA 12/05/2008  . AAA 09/19/2008  . PERIPHERAL VASCULAR DISEASE 09/19/2008  . GERD 09/19/2008  . DIVERTICULOSIS, COLON 09/19/2008  . RENAL INSUFFICIENCY 09/19/2008  . LOW BACK PAIN 09/19/2008  . PSA, INCREASED 09/19/2008  . COLONIC POLYPS, HX OF 09/19/2008    adenomatous polyps 07/2010  . GASTROINTESTINAL HEMORRHAGE, HX OF 04/19/2008  . NEPHROLITHIASIS, HX OF 01/11/2009  . CORONARY ARTERY BYPASS GRAFT, HX OF 12/05/2008    A. LIMA-LAD, VG-RI, VG-OM2, VG-RPD/RPL;  B. 05/2011 - NSTEMI - CATH WITH 4/5 PATENT GRAFTS AND NEW THROMBUS IN DISTAL VG-OM2 - MED RX  . ANXIETY 06/13/2010  . Arthritis   . COPD (chronic obstructive pulmonary disease)   . Myocardial infarction   . Allergy   . Benign  esophageal stricture 09/2075    dilated during EGD  . Erectile dysfunction 09/13/2012    ROS:   All systems reviewed and negative except as noted in the HPI.   Past Surgical History  Procedure Laterality Date  . Aorto-femoral bypass x5 - stress test neg 9/08    . Coronary artery bypass graft    . S/p afib ablation  2004  . Cardiac catheterization  05/25/2011  . Colon surgery    . Esophagogastroduodenoscopy  04/28/2012    Procedure: ESOPHAGOGASTRODUODENOSCOPY (EGD);  Surgeon: Louis Meckel, MD;  Location: [facility] ENDOSCOPY;  Service: Endoscopy;  Laterality: N/A;     Family History  Problem Relation Age of Onset  . Diabetes Father   . Diabetes Sister   . Multiple myeloma Mother   . Cancer Mother   . Other Mother     varicose veins  . Heart disease Paternal Uncle   . Heart disease Maternal Uncle   . Lymphoma Sister     half-sister     History   Social History  . Marital Status: Married    Spouse Name: Victorino Dike    Number of Children: N/A  . Years of Education: N/A   Occupational History  . self employeed - Contractor    Social History Main Topics  . Smoking status: Former Smoker -- 4.00 packs/day for 31 years    Types: Cigarettes    Quit date: 03/19/1983  . Smokeless tobacco: Never Used  . Alcohol Use: 6.0 oz/week    10 Shots of liquor per week  . Drug Use: No  . Sexual Activity: Yes   Other Topics Concern  . Not on file   Social History Narrative   Married lives with his wife     BP 162/85  Pulse 55  Ht 5\' 9"  (1.753 m)  Wt 184 lb 12.8 oz (83.825 kg)  BMI 27.28 kg/m2  Physical Exam:  Well appearing 78 year old man,NAD HEENT: Unremarkable Neck:  No JVD, no thyromegally Back:  No CVA tenderness Lungs:  Clear with no wheezes, rales, or rhonchi. HEART:  Regular rate rhythm, no murmurs, no rubs, no clicks Abd:  soft, positive bowel sounds, no organomegally, no rebound, no guarding Ext:  2 plus pulses, no edema, no cyanosis, no clubbing Skin:  No  rashes no nodules Neuro:  CN II through XII intact, motor grossly intact  EKG - sinus bradycardia with left axis deviation   Assess/Plan:

## 2013-04-25 NOTE — Assessment & Plan Note (Signed)
He is maintaining sinus rhythm and he is tolerating systemic anticoagulation. No change in medical therapy.

## 2013-05-02 ENCOUNTER — Telehealth: Payer: Self-pay | Admitting: Pulmonary Disease

## 2013-05-02 MED ORDER — TIOTROPIUM BROMIDE MONOHYDRATE 18 MCG IN CAPS: 18.0000 ug | ORAL_CAPSULE | Freq: Every day | RESPIRATORY_TRACT | Status: DC

## 2013-05-02 NOTE — Telephone Encounter (Signed)
Pt is aware RX has been sent. Nothing further needed 

## 2013-05-30 ENCOUNTER — Telehealth: Payer: Self-pay | Admitting: Internal Medicine

## 2013-05-30 DIAGNOSIS — N529 Male erectile dysfunction, unspecified: Secondary | ICD-10-CM

## 2013-05-30 NOTE — Telephone Encounter (Signed)
Referral done

## 2013-05-30 NOTE — Telephone Encounter (Signed)
Mr. Sakuma went to [facility] as a self referral.  He now has [facility] and will need a referral to be seen.  The DX is E D.

## 2013-06-27 ENCOUNTER — Ambulatory Visit (INDEPENDENT_AMBULATORY_CARE_PROVIDER_SITE_OTHER): Payer: Medicare HMO | Admitting: Internal Medicine

## 2013-06-27 ENCOUNTER — Encounter: Payer: Self-pay | Admitting: Internal Medicine

## 2013-06-27 ENCOUNTER — Other Ambulatory Visit (INDEPENDENT_AMBULATORY_CARE_PROVIDER_SITE_OTHER): Payer: Medicare HMO

## 2013-06-27 VITALS — BP 120/70 | HR 56 | Temp 97.6°F | Wt 185.1 lb

## 2013-06-27 DIAGNOSIS — N189 Chronic kidney disease, unspecified: Secondary | ICD-10-CM

## 2013-06-27 DIAGNOSIS — J4489 Other specified chronic obstructive pulmonary disease: Secondary | ICD-10-CM

## 2013-06-27 DIAGNOSIS — I10 Essential (primary) hypertension: Secondary | ICD-10-CM

## 2013-06-27 DIAGNOSIS — I2510 Atherosclerotic heart disease of native coronary artery without angina pectoris: Secondary | ICD-10-CM

## 2013-06-27 DIAGNOSIS — M25519 Pain in unspecified shoulder: Secondary | ICD-10-CM

## 2013-06-27 DIAGNOSIS — R21 Rash and other nonspecific skin eruption: Secondary | ICD-10-CM

## 2013-06-27 DIAGNOSIS — E785 Hyperlipidemia, unspecified: Secondary | ICD-10-CM

## 2013-06-27 DIAGNOSIS — Z0000 Encounter for general adult medical examination without abnormal findings: Secondary | ICD-10-CM

## 2013-06-27 DIAGNOSIS — M25511 Pain in right shoulder: Secondary | ICD-10-CM

## 2013-06-27 DIAGNOSIS — J449 Chronic obstructive pulmonary disease, unspecified: Secondary | ICD-10-CM

## 2013-06-27 DIAGNOSIS — N400 Enlarged prostate without lower urinary tract symptoms: Secondary | ICD-10-CM

## 2013-06-27 LAB — CBC WITH DIFFERENTIAL/PLATELET
BASOS ABS: 0 10*3/uL (ref 0.0–0.1)
Basophils Relative: 0.9 % (ref 0.0–3.0)
EOS ABS: 0.3 10*3/uL (ref 0.0–0.7)
Eosinophils Relative: 5.5 % — ABNORMAL HIGH (ref 0.0–5.0)
HEMATOCRIT: 44.6 % (ref 39.0–52.0)
Hemoglobin: 14.6 g/dL (ref 13.0–17.0)
LYMPHS ABS: 1.2 10*3/uL (ref 0.7–4.0)
Lymphocytes Relative: 21.6 % (ref 12.0–46.0)
MCHC: 32.7 g/dL (ref 30.0–36.0)
MCV: 101.3 fl — AB (ref 78.0–100.0)
MONO ABS: 0.5 10*3/uL (ref 0.1–1.0)
Monocytes Relative: 10.2 % (ref 3.0–12.0)
NEUTROS PCT: 61.8 % (ref 43.0–77.0)
Neutro Abs: 3.3 10*3/uL (ref 1.4–7.7)
Platelets: 201 10*3/uL (ref 150.0–400.0)
RBC: 4.41 Mil/uL (ref 4.22–5.81)
RDW: 14.8 % — AB (ref 11.5–14.6)
WBC: 5.3 10*3/uL (ref 4.5–10.5)

## 2013-06-27 LAB — LIPID PANEL
CHOL/HDL RATIO: 5
Cholesterol: 203 mg/dL — ABNORMAL HIGH (ref 0–200)
HDL: 44.8 mg/dL (ref >39.00)
Triglycerides: 275 mg/dL — ABNORMAL HIGH (ref 0.0–149.0)
VLDL: 55 mg/dL — ABNORMAL HIGH (ref 0.0–40.0)

## 2013-06-27 LAB — HEPATIC FUNCTION PANEL
ALK PHOS: 89 U/L (ref 39–117)
ALT: 19 U/L (ref 0–53)
AST: 18 U/L (ref 0–37)
Albumin: 4.1 g/dL (ref 3.5–5.2)
BILIRUBIN DIRECT: 0.2 mg/dL (ref 0.0–0.3)
BILIRUBIN TOTAL: 1.2 mg/dL (ref 0.3–1.2)
Total Protein: 7.5 g/dL (ref 6.0–8.3)

## 2013-06-27 LAB — URINALYSIS, ROUTINE W REFLEX MICROSCOPIC
BILIRUBIN URINE: NEGATIVE
Hgb urine dipstick: NEGATIVE
KETONES UR: NEGATIVE
LEUKOCYTES UA: NEGATIVE
NITRITE: NEGATIVE
PH: 6 (ref 5.0–8.0)
SPECIFIC GRAVITY, URINE: 1.015 (ref 1.000–1.030)
Total Protein, Urine: NEGATIVE
UROBILINOGEN UA: 0.2 (ref 0.0–1.0)
Urine Glucose: NEGATIVE
WBC, UA: NONE SEEN — AB (ref 0–?)

## 2013-06-27 LAB — BASIC METABOLIC PANEL
BUN: 20 mg/dL (ref 6–23)
CALCIUM: 9.7 mg/dL (ref 8.4–10.5)
CO2: 26 mEq/L (ref 19–32)
Chloride: 104 mEq/L (ref 96–112)
Creatinine, Ser: 1.6 mg/dL — ABNORMAL HIGH (ref 0.4–1.5)
GFR: 45.12 mL/min — AB (ref >60.00)
Glucose, Bld: 100 mg/dL — ABNORMAL HIGH (ref 70–99)
Potassium: 4.8 mEq/L (ref 3.5–5.1)
SODIUM: 138 meq/L (ref 135–145)

## 2013-06-27 LAB — TSH: TSH: 3.96 u[IU]/mL (ref 0.35–5.50)

## 2013-06-27 LAB — PSA: PSA: 2.15 ng/mL (ref 0.10–4.00)

## 2013-06-27 LAB — LDL CHOLESTEROL, DIRECT: LDL DIRECT: 113.9 mg/dL

## 2013-06-27 MED ORDER — METHYLPREDNISOLONE 4 MG PO KIT: PACK | ORAL | Status: DC

## 2013-06-27 NOTE — Progress Notes (Signed)
Pre-visit discussion using our clinic review tool. No additional management support is needed unless otherwise documented below in the visit note.  

## 2013-06-27 NOTE — Progress Notes (Signed)
Subjective:    Patient ID: Omar Benson, male    DOB: Apr 09, 1935, 79 y.o.   MRN: [id number]  HPI  Here to f/u,  C/o 2 mo spotted red itchy rash primarily to diffuse back and left side, few spots to left abd, ongoing mild to start, then worse in the past wk.  No fever, no pain otherwise, no recent other unusual exposures except did place some insulation recently.   No swelling, worsening sob or wheezing.    Wonders if xaralto is like statins that can cause muscle pain, b/c having 6 wks pain to the ant right shoulder; has hx of torn right bicep without surgury, still does significant lifting and work about the house, but did hold off on further wt lifting. Mild overall, does not want other eval or tx.   Still declines immunizations.  Also with intermittent humming in ears, realizes now no one else hears it. Also with ongoing sinus/allergy symptoms, does not like to take meds for it.  Pt denies chest pain, increased sob or doe, wheezing, orthopnea, PND, increased LE swelling, palpitations, dizziness or syncope.  Pt denies polydipsia, polyuria,  Past Medical History  Diagnosis Date  . Hypothyroidism 12/05/2008  . HYPERLIPIDEMIA 09/19/2008  . GOUT 09/19/2008  . ANEMIA-IRON DEFICIENCY 09/19/2008  . HYPERTENSION 09/19/2008  . CAD 12/05/2008    s/p CABG; NSTEMI 05/2011 - LHC 05/25/11: LAD occluded, LIMA-LAD patent, ostial circumflex 50%, AV circumflex 70%, SVG-OM2 with extensive disease with thrombus (culprit vessel), RCA 80% and occluded, SVG-intermediate patent, SVG-PDA/PL A patent.  Given that the culprit vessel was a subtotally occluded heavily thrombotic graft to a smaller OM2, medical therapy was recommended.;   . Atrial fibrillation 04/19/2008    amiodarone rx;  Echocardiogram 05/26/11: No wall motion abnormalities, mild LVH, EF 60%.  . Atrial flutter 12/05/2008    s/p RFCA  . BRADYCARDIA 12/05/2008  . AAA 09/19/2008  . PERIPHERAL VASCULAR DISEASE 09/19/2008  . GERD 09/19/2008  . DIVERTICULOSIS, COLON 09/19/2008  .  RENAL INSUFFICIENCY 09/19/2008  . LOW BACK PAIN 09/19/2008  . PSA, INCREASED 09/19/2008  . COLONIC POLYPS, HX OF 09/19/2008    adenomatous polyps 07/2010  . GASTROINTESTINAL HEMORRHAGE, HX OF 04/19/2008  . NEPHROLITHIASIS, HX OF 01/11/2009  . CORONARY ARTERY BYPASS GRAFT, HX OF 12/05/2008    A. LIMA-LAD, VG-RI, VG-OM2, VG-RPD/RPL;  B. 05/2011 - NSTEMI - CATH WITH 4/5 PATENT GRAFTS AND NEW THROMBUS IN DISTAL VG-OM2 - MED RX  . ANXIETY 06/13/2010  . Arthritis   . COPD (chronic obstructive pulmonary disease)   . Myocardial infarction   . Allergy   . Benign esophageal stricture 09/2075    dilated during EGD  . Erectile dysfunction 09/13/2012   Past Surgical History  Procedure Laterality Date  . Aorto-femoral bypass x5 - stress test neg 9/08    . Coronary artery bypass graft    . S/p afib ablation  2004  . Cardiac catheterization  05/25/2011  . Colon surgery    . Esophagogastroduodenoscopy  04/28/2012    Procedure: ESOPHAGOGASTRODUODENOSCOPY (EGD);  Surgeon: [name], MD;  Location: [facility];  Service: Endoscopy;  Laterality: N/A;    reports that he quit smoking about 30 years ago. His smoking use included Cigarettes. He has a 124 pack-year smoking history. He has never used smokeless tobacco. He reports that he drinks about 6.0 ounces of alcohol per week. He reports that he does not use illicit drugs. family history includes Cancer in his mother; Diabetes in his  father and sister; Heart disease in his maternal uncle and paternal uncle; Lymphoma in his sister; Multiple myeloma in his mother; Other in his mother. Allergies  Allergen Reactions  . Bee Venom Anaphylaxis    Only yellow jackets   . Other Nausea And Vomiting    Seafood-diarrhea  . Doxycycline Hyclate Other (See Comments)    esophagitis  . Lipitor [Atorvastatin Calcium] Other (See Comments)    Myalgia/myopathy  . Promethazine Hcl Other (See Comments)    syncope  . Tetracycline Other (See Comments)    Esophagitis  . Tetanus  Toxoid Rash   Current Outpatient Prescriptions on File Prior to Visit  Medication Sig Dispense Refill  . albuterol (PROVENTIL HFA;VENTOLIN HFA) 108 (90 BASE) MCG/ACT inhaler Inhale 2 puffs into the lungs every 6 (six) hours as needed for wheezing.  1 Inhaler  6  . allopurinol (ZYLOPRIM) 300 MG tablet TAKE 1 TABLET BY MOUTH EVERY DAY  90 tablet  3  . ALPRAZolam (XANAX) 0.25 MG tablet Take 1 tablet (0.25 mg total) by mouth 2 (two) times daily as needed.  60 tablet  0  . amiodarone (PACERONE) 100 MG tablet Take 1 tablet (100 mg total) by mouth daily.  90 tablet  3  . aspirin EC 81 MG tablet Take 1 tablet (81 mg total) by mouth daily.      Marland Kitchen EPINEPHrine (EPIPEN) 0.3 mg/0.3 mL SOAJ injection Inject 0.3 mLs (0.3 mg total) into the muscle once.  2 Device  2  . ferrous sulfate 325 (65 FE) MG tablet Take 325 mg by mouth daily.      . isosorbide mononitrate (IMDUR) 30 MG 24 hr tablet Take 1 tablet by mouth daily.      . Multiple Vitamin (MULTIVITAMIN) tablet Take 1 tablet by mouth at bedtime.       . pantoprazole (PROTONIX) 40 MG tablet Take 1 tablet (40 mg total) by mouth daily.  90 tablet  3  . Rivaroxaban (XARELTO) 20 MG TABS tablet Take 1 tablet (20 mg total) by mouth daily with supper.  90 tablet  3  . SYNTHROID 100 MCG tablet TAKE 1 TABLET BY MOUTH EVERY DAY  90 tablet  3  . tiotropium (SPIRIVA HANDIHALER) 18 MCG inhalation capsule Place 1 capsule (18 mcg total) into inhaler and inhale daily.  90 capsule  3  . nitroGLYCERIN (NITROSTAT) 0.4 MG SL tablet Place 1 tablet (0.4 mg total) under the tongue every 5 (five) minutes as needed for chest pain.  25 tablet  3   No current facility-administered medications on file prior to visit.     Review of Systems  Constitutional: Negative for unexpected weight change, or unusual diaphoresis  HENT: Negative for tinnitus.   Eyes: Negative for photophobia and visual disturbance.  Respiratory: Negative for choking and stridor.   Gastrointestinal: Negative  for vomiting and blood in stool.  Genitourinary: Negative for hematuria and decreased urine volume.  Musculoskeletal: Negative for acute joint swelling Skin: Negative for color change and wound.  Neurological: Negative for tremors and numbness other than noted  Psychiatric/Behavioral: Negative for decreased concentration or  hyperactivity.       Objective:   Physical Exam BP 120/70  Pulse 56  Temp(Src) 97.6 F (36.4 C) (Oral)  Wt 185 lb 2 oz (83.972 kg)  SpO2 94% VS noted,  Constitutional: Pt appears well-developed and well-nourished.  HENT: Head: NCAT.  Right Ear: External ear normal.  Left Ear: External ear normal.  Eyes: Conjunctivae and EOM are normal.  Pupils are equal, round, and reactive to light.  Neck: Normal range of motion. Neck supple.  Cardiovascular: Normal rate and regular rhythm.   Pulmonary/Chest: Effort normal and breath sounds mild decreased, no wheezing.  Abd:  Soft, NT, non-distended, + BS Neurological: Pt is alert. Not confused  Skin: numerous < 5 mm erythem macular rash to back Psychiatric: Pt behavior is normal. Thought content normal.     Assessment & Plan:

## 2013-06-27 NOTE — Patient Instructions (Addendum)
Please take all new medication as prescribed - the medrol pack for the rash  Please call for dermatology referral if not improved  Please call if you change your mind about seeing Dr Tamala Julian in our office about your shoulder  Please continue all other medications as before, and refills have been done if requested. Please have the pharmacy call with any other refills you may need.  Please keep your appointments with your specialists as you have planned - all referrals have been done for [facility] purpose  Please go to the LAB in the Basement (turn left off the elevator) for the tests to be done today You will be contacted by phone if any changes need to be made immediately.  Otherwise, you will receive a letter about your results with an explanation, but please check with MyChart first.  Please return in 6 months, or sooner if needed, with Lab testing done 3-5 days before

## 2013-07-01 NOTE — Assessment & Plan Note (Signed)
Suspect right bicipital tendonitis, tylenol prn, declines sport med referral,  to f/u any worsening symptoms or concerns

## 2013-07-01 NOTE — Assessment & Plan Note (Signed)
stable overall by history and exam, recent data reviewed with pt, and pt to continue medical treatment as before,  to f/u any worsening symptoms or concerns Lab Results  Component Value Date   LDLCALC 83 09/15/2012

## 2013-07-01 NOTE — Assessment & Plan Note (Signed)
stable overall by history and exam, recent data reviewed with pt, and pt to continue medical treatment as before,  to f/u any worsening symptoms or concerns SpO2 Readings from Last 3 Encounters:  06/27/13 94%  02/01/13 97%  12/22/12 97%

## 2013-07-01 NOTE — Assessment & Plan Note (Signed)
stable overall by history and exam, recent data reviewed with pt, and pt to continue medical treatment as before,  to f/u any worsening symptoms or concerns BP Readings from Last 3 Encounters:  06/27/13 120/70  04/25/13 162/85  02/01/13 150/82

## 2013-07-01 NOTE — Assessment & Plan Note (Addendum)
Unclear etiology, ? Insulation related, for medrol pack, consider derm or allergy if not improved  Note:  Total time for pt hx, exam, review of record with pt in the room, determination of diagnoses and plan for further eval and tx is > 40 min, with over 50% spent in coordination and counseling of patient

## 2013-07-01 NOTE — Assessment & Plan Note (Signed)
For f/u labs, cont current meds  Lab Results  Component Value Date   WBC 5.3 06/27/2013   HGB 14.6 06/27/2013   HCT 44.6 06/27/2013   PLT 201.0 06/27/2013   GLUCOSE 100* 06/27/2013   CHOL 203* 06/27/2013   TRIG 275.0* 06/27/2013   HDL 44.80 06/27/2013   LDLDIRECT 113.9 06/27/2013   LDLCALC 83 09/15/2012   ALT 19 06/27/2013   AST 18 06/27/2013   NA 138 06/27/2013   K 4.8 06/27/2013   CL 104 06/27/2013   CREATININE 1.6* 06/27/2013   BUN 20 06/27/2013   CO2 26 06/27/2013   TSH 3.96 06/27/2013   PSA 2.15 06/27/2013   INR 1.31 04/28/2012   HGBA1C 5.4 05/31/2011

## 2013-08-12 ENCOUNTER — Other Ambulatory Visit: Payer: Self-pay | Admitting: Internal Medicine

## 2013-09-11 ENCOUNTER — Encounter: Payer: Self-pay | Admitting: Pulmonary Disease

## 2013-09-11 ENCOUNTER — Ambulatory Visit (INDEPENDENT_AMBULATORY_CARE_PROVIDER_SITE_OTHER): Payer: Medicare HMO | Admitting: Pulmonary Disease

## 2013-09-11 VITALS — BP 152/90 | HR 56 | Temp 97.1°F | Ht 68.5 in | Wt 190.0 lb

## 2013-09-11 DIAGNOSIS — J449 Chronic obstructive pulmonary disease, unspecified: Secondary | ICD-10-CM

## 2013-09-11 NOTE — Progress Notes (Signed)
   Subjective:    Patient ID: Omar Benson, male    DOB: Jun 28, 1934, 79 y.o.   MRN: [id number]  HPI Patient comes in today for followup of his known COPD. At the last visit in the summer, he was having issues with increasing shortness of breath. It was unclear if this was related to his COPD, or possibly to his heart disease. He was tried on a short course of prednisone, without any significant change. He tells me this spontaneously got better, and he wonders if it was related to the high heat and humidity of the summer months. He is currently doing very well from a breathing standpoint, and is staying on his Spiriva. He has not had any acute exacerbations.   Review of Systems  Constitutional: Negative for fever and unexpected weight change.  HENT: Positive for sinus pressure. Negative for congestion, dental problem, ear pain, nosebleeds, postnasal drip, rhinorrhea, sneezing, sore throat and trouble swallowing.   Eyes: Negative for redness and itching.  Respiratory: Negative for cough, chest tightness, shortness of breath and wheezing.   Cardiovascular: Negative for palpitations and leg swelling.  Gastrointestinal: Negative for nausea and vomiting.  Genitourinary: Negative for dysuria.  Musculoskeletal: Negative for joint swelling.  Skin: Negative for rash.  Neurological: Negative for headaches.  Hematological: Does not bruise/bleed easily.  Psychiatric/Behavioral: Negative for dysphoric mood. The patient is not nervous/anxious.        Objective:   Physical Exam Well-developed male in no acute distress Nose without purulence or discharge noted Neck without lymphadenopathy or thyromegaly Chest with no significant decreased breath sounds, no wheezes or rhonchi Cardiac exam with regular rate and rhythm Lower extremities without edema, no cyanosis Alert and oriented, moves all 4 extremities.       Assessment & Plan:

## 2013-09-11 NOTE — Patient Instructions (Signed)
Stay on spiriva once a day, as well as albuterol for rescue.  If you want to try and come off spiriva as a trial, that is ok too. Try zyrtec 10mg  one at bedtime each night for allergy symptoms/post nasal drip. Stay as active as possible. followup with me again in one year.

## 2013-09-11 NOTE — Assessment & Plan Note (Signed)
The patient appears to be stable from a COPD standpoint, and I've asked him to continue on his current medications. I've also asked him to stay as active as possible, and to let us know if he sees a change in his exertional tolerance.

## 2013-12-26 ENCOUNTER — Encounter: Payer: Self-pay | Admitting: Internal Medicine

## 2013-12-26 ENCOUNTER — Ambulatory Visit (INDEPENDENT_AMBULATORY_CARE_PROVIDER_SITE_OTHER): Payer: Medicare HMO | Admitting: Internal Medicine

## 2013-12-26 ENCOUNTER — Other Ambulatory Visit (INDEPENDENT_AMBULATORY_CARE_PROVIDER_SITE_OTHER): Payer: Medicare HMO

## 2013-12-26 VITALS — BP 120/84 | HR 54 | Temp 98.0°F | Wt 184.2 lb

## 2013-12-26 DIAGNOSIS — Z0000 Encounter for general adult medical examination without abnormal findings: Secondary | ICD-10-CM

## 2013-12-26 DIAGNOSIS — K111 Hypertrophy of salivary gland: Secondary | ICD-10-CM

## 2013-12-26 DIAGNOSIS — L989 Disorder of the skin and subcutaneous tissue, unspecified: Secondary | ICD-10-CM

## 2013-12-26 LAB — CBC WITH DIFFERENTIAL/PLATELET
BASOS ABS: 0 10*3/uL (ref 0.0–0.1)
Basophils Relative: 0.3 % (ref 0.0–3.0)
EOS ABS: 0.3 10*3/uL (ref 0.0–0.7)
EOS PCT: 5.8 % — AB (ref 0.0–5.0)
HEMATOCRIT: 39.8 % (ref 39.0–52.0)
Hemoglobin: 13.3 g/dL (ref 13.0–17.0)
LYMPHS ABS: 1.3 10*3/uL (ref 0.7–4.0)
Lymphocytes Relative: 25.7 % (ref 12.0–46.0)
MCHC: 33.3 g/dL (ref 30.0–36.0)
MCV: 95.2 fl (ref 78.0–100.0)
MONO ABS: 0.6 10*3/uL (ref 0.1–1.0)
Monocytes Relative: 11.5 % (ref 3.0–12.0)
Neutro Abs: 2.8 10*3/uL (ref 1.4–7.7)
Neutrophils Relative %: 56.7 % (ref 43.0–77.0)
PLATELETS: 220 10*3/uL (ref 150.0–400.0)
RBC: 4.19 Mil/uL — ABNORMAL LOW (ref 4.22–5.81)
RDW: 13.9 % (ref 11.5–15.5)
WBC: 4.9 10*3/uL (ref 4.0–10.5)

## 2013-12-26 LAB — LIPID PANEL
CHOL/HDL RATIO: 5
CHOLESTEROL: 179 mg/dL (ref 0–200)
HDL: 37.3 mg/dL — ABNORMAL LOW (ref >39.00)
NonHDL: 141.7
Triglycerides: 240 mg/dL — ABNORMAL HIGH (ref 0.0–149.0)
VLDL: 48 mg/dL — ABNORMAL HIGH (ref 0.0–40.0)

## 2013-12-26 LAB — URINALYSIS, ROUTINE W REFLEX MICROSCOPIC
Bilirubin Urine: NEGATIVE
Hgb urine dipstick: NEGATIVE
Ketones, ur: NEGATIVE
Leukocytes, UA: NEGATIVE
Nitrite: NEGATIVE
PH: 6 (ref 5.0–8.0)
RBC / HPF: NONE SEEN (ref 0–?)
SPECIFIC GRAVITY, URINE: 1.01 (ref 1.000–1.030)
Total Protein, Urine: NEGATIVE
URINE GLUCOSE: NEGATIVE
Urobilinogen, UA: 0.2 (ref 0.0–1.0)

## 2013-12-26 LAB — BASIC METABOLIC PANEL
BUN: 20 mg/dL (ref 6–23)
CHLORIDE: 102 meq/L (ref 96–112)
CO2: 22 mEq/L (ref 19–32)
Calcium: 9.6 mg/dL (ref 8.4–10.5)
Creatinine, Ser: 1.8 mg/dL — ABNORMAL HIGH (ref 0.4–1.5)
GFR: 39.56 mL/min — AB (ref >60.00)
GLUCOSE: 100 mg/dL — AB (ref 70–99)
Potassium: 4.6 mEq/L (ref 3.5–5.1)
Sodium: 138 mEq/L (ref 135–145)

## 2013-12-26 LAB — PSA: PSA: 1.76 ng/mL (ref 0.10–4.00)

## 2013-12-26 LAB — LDL CHOLESTEROL, DIRECT: LDL DIRECT: 104.5 mg/dL

## 2013-12-26 LAB — TSH: TSH: 8.04 u[IU]/mL — ABNORMAL HIGH (ref 0.35–4.50)

## 2013-12-26 LAB — HEPATIC FUNCTION PANEL
ALT: 14 U/L (ref 0–53)
AST: 18 U/L (ref 0–37)
Albumin: 4.1 g/dL (ref 3.5–5.2)
Alkaline Phosphatase: 99 U/L (ref 39–117)
BILIRUBIN TOTAL: 1.1 mg/dL (ref 0.2–1.2)
Bilirubin, Direct: 0.2 mg/dL (ref 0.0–0.3)
Total Protein: 7.3 g/dL (ref 6.0–8.3)

## 2013-12-26 MED ORDER — NITROGLYCERIN 0.4 MG SL SUBL: 0.4000 mg | SUBLINGUAL_TABLET | SUBLINGUAL | Status: DC | PRN

## 2013-12-26 NOTE — Progress Notes (Signed)
Pre visit review using our clinic review tool, if applicable. No additional management support is needed unless otherwise documented below in the visit note. 

## 2013-12-26 NOTE — Progress Notes (Signed)
Subjective:    Patient ID: Omar Benson, male    DOB: 12-Sep-1934, 79 y.o.   MRN: [id number]  HPI    Here for wellness and f/u;  Overall doing ok;  Pt denies CP, worsening SOB, DOE, wheezing, orthopnea, PND, worsening LE edema, palpitations, dizziness or syncope.  Pt denies neurological change such as new headache, facial or extremity weakness.  Pt denies polydipsia, polyuria, or low sugar symptoms. Pt states overall good compliance with treatment and medications, good tolerability, and has been trying to follow lower cholesterol diet.  Pt denies worsening depressive symptoms, suicidal ideation or panic. No fever, night sweats, wt loss, loss of appetite, or other constitutional symptoms.  Pt states good ability with ADL's, has low fall risk, home safety reviewed and adequate, no other significant changes in hearing or vision, and only occasionally active with exercise.  Declines immuinzations today . Has ongoing salivary gland right neck ? Enlarging last few wks - asks for ent referral.  Also derm referral for f/u skin cancer Past Medical History  Diagnosis Date  . Hypothyroidism 12/05/2008  . HYPERLIPIDEMIA 09/19/2008  . GOUT 09/19/2008  . ANEMIA-IRON DEFICIENCY 09/19/2008  . HYPERTENSION 09/19/2008  . CAD 12/05/2008    s/p CABG; NSTEMI 05/2011 - LHC 05/25/11: LAD occluded, LIMA-LAD patent, ostial circumflex 50%, AV circumflex 70%, SVG-OM2 with extensive disease with thrombus (culprit vessel), RCA 80% and occluded, SVG-intermediate patent, SVG-PDA/PL A patent.  Given that the culprit vessel was a subtotally occluded heavily thrombotic graft to a smaller OM2, medical therapy was recommended.;   . Atrial fibrillation 04/19/2008    amiodarone rx;  Echocardiogram 05/26/11: No wall motion abnormalities, mild LVH, EF 60%.  . Atrial flutter 12/05/2008    s/p RFCA  . BRADYCARDIA 12/05/2008  . AAA 09/19/2008  . PERIPHERAL VASCULAR DISEASE 09/19/2008  . GERD 09/19/2008  . DIVERTICULOSIS, COLON 09/19/2008  . RENAL INSUFFICIENCY  09/19/2008  . LOW BACK PAIN 09/19/2008  . PSA, INCREASED 09/19/2008  . COLONIC POLYPS, HX OF 09/19/2008    adenomatous polyps 07/2010  . GASTROINTESTINAL HEMORRHAGE, HX OF 04/19/2008  . NEPHROLITHIASIS, HX OF 01/11/2009  . CORONARY ARTERY BYPASS GRAFT, HX OF 12/05/2008    A. LIMA-LAD, VG-RI, VG-OM2, VG-RPD/RPL;  B. 05/2011 - NSTEMI - CATH WITH 4/5 PATENT GRAFTS AND NEW THROMBUS IN DISTAL VG-OM2 - MED RX  . ANXIETY 06/13/2010  . Arthritis   . COPD (chronic obstructive pulmonary disease)   . Myocardial infarction   . Allergy   . Benign esophageal stricture 09/2075    dilated during EGD  . Erectile dysfunction 09/13/2012  . CKD (chronic kidney disease) 09/19/2008    Qualifier: Diagnosis of  By: Jenny Reichmann MD, Hunt Oris    Past Surgical History  Procedure Laterality Date  . Aorto-femoral bypass x5 - stress test neg 9/08    . Coronary artery bypass graft    . S/p afib ablation  2004  . Cardiac catheterization  05/25/2011  . Colon surgery    . Esophagogastroduodenoscopy  04/28/2012    Procedure: ESOPHAGOGASTRODUODENOSCOPY (EGD);  Surgeon: [name], MD;  Location: [facility];  Service: Endoscopy;  Laterality: N/A;    reports that he quit smoking about 30 years ago. His smoking use included Cigarettes. He has a 124 pack-year smoking history. He has never used smokeless tobacco. He reports that he drinks about 6 ounces of alcohol per week. He reports that he does not use illicit drugs. family history includes Cancer in his mother; Diabetes in his father  and sister; Heart disease in his maternal uncle and paternal uncle; Lymphoma in his sister; Multiple myeloma in his mother; Other in his mother. Allergies  Allergen Reactions  . Bee Venom Anaphylaxis    Only yellow jackets   . Other Nausea And Vomiting    Seafood-diarrhea  . Crestor [Rosuvastatin]     Myalgia/myopathy  . Doxycycline Hyclate Other (See Comments)    esophagitis  . Lipitor [Atorvastatin Calcium] Other (See Comments)    Myalgia/myopathy    . Promethazine Hcl Other (See Comments)    syncope  . Tetracycline Other (See Comments)    Esophagitis  . Tetanus Toxoid Rash   Current Outpatient Prescriptions on File Prior to Visit  Medication Sig Dispense Refill  . albuterol (PROVENTIL HFA;VENTOLIN HFA) 108 (90 BASE) MCG/ACT inhaler Inhale 2 puffs into the lungs every 6 (six) hours as needed for wheezing.  1 Inhaler  6  . allopurinol (ZYLOPRIM) 300 MG tablet TAKE 1 TABLET BY MOUTH EVERY DAY  90 tablet  3  . amiodarone (PACERONE) 100 MG tablet Take 1 tablet (100 mg total) by mouth daily.  90 tablet  3  . aspirin EC 81 MG tablet Take 1 tablet (81 mg total) by mouth daily.      Marland Kitchen EPINEPHrine (EPIPEN) 0.3 mg/0.3 mL SOAJ injection Inject 0.3 mLs (0.3 mg total) into the muscle once.  2 Device  2  . isosorbide mononitrate (IMDUR) 30 MG 24 hr tablet TAKE 1 TABLET BY MOUTH EVERY DAY  90 tablet  3  . Multiple Vitamin (MULTIVITAMIN) tablet Take 1 tablet by mouth at bedtime.       . pantoprazole (PROTONIX) 40 MG tablet TAKE 1 TABLET BY MOUTH EVERY DAY  90 tablet  3  . Rivaroxaban (XARELTO) 20 MG TABS tablet Take 1 tablet (20 mg total) by mouth daily with supper.  90 tablet  3  . SYNTHROID 100 MCG tablet TAKE 1 TABLET BY MOUTH EVERY DAY  90 tablet  3  . tiotropium (SPIRIVA HANDIHALER) 18 MCG inhalation capsule Place 1 capsule (18 mcg total) into inhaler and inhale daily.  90 capsule  3   No current facility-administered medications on file prior to visit.    Review of Systems  Constitutional: Negative for increased diaphoresis, other activity, appetite or other siginficant weight change  HENT: Negative for worsening hearing loss, ear pain, facial swelling, mouth sores and neck stiffness.   Eyes: Negative for other worsening pain, redness or visual disturbance.  Respiratory: Negative for shortness of breath and wheezing.   Cardiovascular: Negative for chest pain and palpitations.  Gastrointestinal: Negative for diarrhea, blood in stool,  abdominal distention or other pain Genitourinary: Negative for hematuria, flank pain or change in urine volume.  Musculoskeletal: Negative for myalgias or other joint complaints.  Skin: Negative for color change and wound.  Neurological: Negative for syncope and numbness. other than noted Hematological: Negative for adenopathy. or other swelling Psychiatric/Behavioral: Negative for hallucinations, self-injury, decreased concentration or other worsening agitation.        Objective:   Physical Exam BP 120/84  Pulse 54  Temp(Src) 98 F (36.7 C) (Oral)  Wt 184 lb 4 oz (83.575 kg)  SpO2 95% VS noted,  Constitutional: Pt is oriented to person, place, and time. Appears well-developed and well-nourished.  Head: Normocephalic and atraumatic.  Right Ear: External ear normal.  Left Ear: External ear normal.  Nose: Nose normal.  Mouth/Throat: Oropharynx is clear and moist.  Eyes: Conjunctivae and EOM are normal. Pupils are  equal, round, and reactive to light.  Neck: Normal range of motion. Neck supple. No JVD present. No tracheal deviation present. , small lesion ? 1 cm right neck/near angle of jaw subq Cardiovascular: Normal rate, regular rhythm, normal heart sounds and intact distal pulses.   Pulmonary/Chest: Effort normal and breath sounds without rales or wheezing  Abdominal: Soft. Bowel sounds are normal. NT. No HSM  Musculoskeletal: Normal range of motion. Exhibits no edema.  Lymphadenopathy:  Has no cervical adenopathy.  Neurological: Pt is alert and oriented to person, place, and time. Pt has normal reflexes. No cranial nerve deficit. Motor grossly intact Skin: Skin is warm and dry. No rash noted.  Psychiatric:  Has normal mood and affect. Behavior is normal.      Assessment & Plan:

## 2013-12-26 NOTE — Patient Instructions (Signed)
Please continue all other medications as before, and refills have been done if requested. - the nitroglycerin  Please have the pharmacy call with any other refills you may need.  Please continue your efforts at being more active, low cholesterol diet, and weight control.  You are otherwise up to date with prevention measures today.  Please keep your appointments with your specialists as you may have planned  You will be contacted regarding the referral for: Dermatology, and ENT  Please go to the LAB in the Basement (turn left off the elevator) for the tests to be done today  You will be contacted by phone if any changes need to be made immediately.  Otherwise, you will receive a letter about your results with an explanation, but please check with MyChart first.  Please remember to sign up for MyChart if you have not done so, as this will be important to you in the future with finding out test results, communicating by private email, and scheduling acute appointments online when needed.  Please return in 6 months, or sooner if needed

## 2013-12-31 DIAGNOSIS — K111 Hypertrophy of salivary gland: Secondary | ICD-10-CM | POA: Insufficient documentation

## 2013-12-31 DIAGNOSIS — L989 Disorder of the skin and subcutaneous tissue, unspecified: Secondary | ICD-10-CM | POA: Insufficient documentation

## 2013-12-31 NOTE — Assessment & Plan Note (Signed)

## 2013-12-31 NOTE — Assessment & Plan Note (Signed)
For derm referral

## 2013-12-31 NOTE — Assessment & Plan Note (Signed)
For ent referral per pt requset

## 2014-01-01 ENCOUNTER — Other Ambulatory Visit: Payer: Self-pay | Admitting: Internal Medicine

## 2014-01-27 ENCOUNTER — Other Ambulatory Visit: Payer: Self-pay | Admitting: Internal Medicine

## 2014-02-12 ENCOUNTER — Ambulatory Visit (INDEPENDENT_AMBULATORY_CARE_PROVIDER_SITE_OTHER): Payer: Commercial Managed Care - HMO | Admitting: Emergency Medicine

## 2014-02-12 ENCOUNTER — Encounter (HOSPITAL_COMMUNITY): Payer: Self-pay | Admitting: Internal Medicine

## 2014-02-12 ENCOUNTER — Inpatient Hospital Stay (HOSPITAL_COMMUNITY)
Admission: AD | Admit: 2014-02-12 | Discharge: 2014-02-14 | DRG: 378 | Disposition: A | Discharge status: Home / Self Care | Payer: Medicare HMO | Source: Ambulatory Visit | Attending: Internal Medicine | Admitting: Internal Medicine

## 2014-02-12 VITALS — BP 122/74 | HR 65 | Temp 98.0°F | Resp 17 | Ht 69.0 in | Wt 183.0 lb

## 2014-02-12 DIAGNOSIS — I498 Other specified cardiac arrhythmias: Secondary | ICD-10-CM | POA: Diagnosis present

## 2014-02-12 DIAGNOSIS — Z889 Allergy status to unspecified drugs, medicaments and biological substances status: Secondary | ICD-10-CM

## 2014-02-12 DIAGNOSIS — I714 Abdominal aortic aneurysm, without rupture, unspecified: Secondary | ICD-10-CM

## 2014-02-12 DIAGNOSIS — Z951 Presence of aortocoronary bypass graft: Secondary | ICD-10-CM | POA: Diagnosis not present

## 2014-02-12 DIAGNOSIS — E039 Hypothyroidism, unspecified: Secondary | ICD-10-CM | POA: Diagnosis present

## 2014-02-12 DIAGNOSIS — N183 Chronic kidney disease, stage 3 unspecified: Secondary | ICD-10-CM | POA: Diagnosis present

## 2014-02-12 DIAGNOSIS — I252 Old myocardial infarction: Secondary | ICD-10-CM | POA: Diagnosis not present

## 2014-02-12 DIAGNOSIS — I480 Paroxysmal atrial fibrillation: Secondary | ICD-10-CM | POA: Diagnosis present

## 2014-02-12 DIAGNOSIS — I4891 Unspecified atrial fibrillation: Secondary | ICD-10-CM | POA: Diagnosis present

## 2014-02-12 DIAGNOSIS — I2510 Atherosclerotic heart disease of native coronary artery without angina pectoris: Secondary | ICD-10-CM | POA: Diagnosis present

## 2014-02-12 DIAGNOSIS — D62 Acute posthemorrhagic anemia: Secondary | ICD-10-CM | POA: Diagnosis present

## 2014-02-12 DIAGNOSIS — Z887 Allergy status to serum and vaccine status: Secondary | ICD-10-CM | POA: Diagnosis not present

## 2014-02-12 DIAGNOSIS — Z881 Allergy status to other antibiotic agents status: Secondary | ICD-10-CM | POA: Diagnosis not present

## 2014-02-12 DIAGNOSIS — Z91038 Other insect allergy status: Secondary | ICD-10-CM

## 2014-02-12 DIAGNOSIS — K5730 Diverticulosis of large intestine without perforation or abscess without bleeding: Secondary | ICD-10-CM

## 2014-02-12 DIAGNOSIS — Z0000 Encounter for general adult medical examination without abnormal findings: Secondary | ICD-10-CM

## 2014-02-12 DIAGNOSIS — Z807 Family history of other malignant neoplasms of lymphoid, hematopoietic and related tissues: Secondary | ICD-10-CM

## 2014-02-12 DIAGNOSIS — R972 Elevated prostate specific antigen [PSA]: Secondary | ICD-10-CM

## 2014-02-12 DIAGNOSIS — R5383 Other fatigue: Secondary | ICD-10-CM | POA: Diagnosis present

## 2014-02-12 DIAGNOSIS — R7989 Other specified abnormal findings of blood chemistry: Secondary | ICD-10-CM | POA: Diagnosis present

## 2014-02-12 DIAGNOSIS — J309 Allergic rhinitis, unspecified: Secondary | ICD-10-CM

## 2014-02-12 DIAGNOSIS — K5521 Angiodysplasia of colon with hemorrhage: Principal | ICD-10-CM | POA: Diagnosis present

## 2014-02-12 DIAGNOSIS — Z8249 Family history of ischemic heart disease and other diseases of the circulatory system: Secondary | ICD-10-CM | POA: Diagnosis not present

## 2014-02-12 DIAGNOSIS — K2990 Gastroduodenitis, unspecified, without bleeding: Secondary | ICD-10-CM

## 2014-02-12 DIAGNOSIS — K2970 Gastritis, unspecified, without bleeding: Secondary | ICD-10-CM

## 2014-02-12 DIAGNOSIS — Z87891 Personal history of nicotine dependence: Secondary | ICD-10-CM | POA: Diagnosis not present

## 2014-02-12 DIAGNOSIS — Z7982 Long term (current) use of aspirin: Secondary | ICD-10-CM | POA: Diagnosis not present

## 2014-02-12 DIAGNOSIS — M109 Gout, unspecified: Secondary | ICD-10-CM

## 2014-02-12 DIAGNOSIS — Z7901 Long term (current) use of anticoagulants: Secondary | ICD-10-CM | POA: Diagnosis not present

## 2014-02-12 DIAGNOSIS — M25511 Pain in right shoulder: Secondary | ICD-10-CM

## 2014-02-12 DIAGNOSIS — J449 Chronic obstructive pulmonary disease, unspecified: Secondary | ICD-10-CM | POA: Diagnosis present

## 2014-02-12 DIAGNOSIS — D509 Iron deficiency anemia, unspecified: Secondary | ICD-10-CM

## 2014-02-12 DIAGNOSIS — I214 Non-ST elevation (NSTEMI) myocardial infarction: Secondary | ICD-10-CM

## 2014-02-12 DIAGNOSIS — I482 Chronic atrial fibrillation, unspecified: Secondary | ICD-10-CM

## 2014-02-12 DIAGNOSIS — Z79899 Other long term (current) drug therapy: Secondary | ICD-10-CM

## 2014-02-12 DIAGNOSIS — L989 Disorder of the skin and subcutaneous tissue, unspecified: Secondary | ICD-10-CM

## 2014-02-12 DIAGNOSIS — Z9861 Coronary angioplasty status: Secondary | ICD-10-CM

## 2014-02-12 DIAGNOSIS — I739 Peripheral vascular disease, unspecified: Secondary | ICD-10-CM

## 2014-02-12 DIAGNOSIS — R21 Rash and other nonspecific skin eruption: Secondary | ICD-10-CM

## 2014-02-12 DIAGNOSIS — I10 Essential (primary) hypertension: Secondary | ICD-10-CM

## 2014-02-12 DIAGNOSIS — E785 Hyperlipidemia, unspecified: Secondary | ICD-10-CM | POA: Diagnosis present

## 2014-02-12 DIAGNOSIS — R5381 Other malaise: Secondary | ICD-10-CM | POA: Diagnosis present

## 2014-02-12 DIAGNOSIS — J4489 Other specified chronic obstructive pulmonary disease: Secondary | ICD-10-CM | POA: Diagnosis present

## 2014-02-12 DIAGNOSIS — K921 Melena: Secondary | ICD-10-CM

## 2014-02-12 DIAGNOSIS — J441 Chronic obstructive pulmonary disease with (acute) exacerbation: Secondary | ICD-10-CM

## 2014-02-12 DIAGNOSIS — K222 Esophageal obstruction: Secondary | ICD-10-CM

## 2014-02-12 DIAGNOSIS — I129 Hypertensive chronic kidney disease with stage 1 through stage 4 chronic kidney disease, or unspecified chronic kidney disease: Secondary | ICD-10-CM | POA: Diagnosis present

## 2014-02-12 DIAGNOSIS — N189 Chronic kidney disease, unspecified: Secondary | ICD-10-CM

## 2014-02-12 DIAGNOSIS — Z91013 Allergy to seafood: Secondary | ICD-10-CM

## 2014-02-12 DIAGNOSIS — K111 Hypertrophy of salivary gland: Secondary | ICD-10-CM

## 2014-02-12 DIAGNOSIS — Z833 Family history of diabetes mellitus: Secondary | ICD-10-CM | POA: Diagnosis not present

## 2014-02-12 DIAGNOSIS — Z87442 Personal history of urinary calculi: Secondary | ICD-10-CM

## 2014-02-12 DIAGNOSIS — F411 Generalized anxiety disorder: Secondary | ICD-10-CM

## 2014-02-12 DIAGNOSIS — Z86010 Personal history of colonic polyps: Secondary | ICD-10-CM

## 2014-02-12 DIAGNOSIS — K922 Gastrointestinal hemorrhage, unspecified: Secondary | ICD-10-CM | POA: Diagnosis present

## 2014-02-12 DIAGNOSIS — K5520 Angiodysplasia of colon without hemorrhage: Secondary | ICD-10-CM

## 2014-02-12 DIAGNOSIS — N912 Amenorrhea, unspecified: Secondary | ICD-10-CM

## 2014-02-12 DIAGNOSIS — Z8719 Personal history of other diseases of the digestive system: Secondary | ICD-10-CM

## 2014-02-12 LAB — COMPREHENSIVE METABOLIC PANEL
ALT: 12 U/L (ref 0–53)
AST: 16 U/L (ref 0–37)
Albumin: 3.5 g/dL (ref 3.5–5.2)
Alkaline Phosphatase: 78 U/L (ref 39–117)
Anion gap: 13 (ref 5–15)
BUN: 38 mg/dL — ABNORMAL HIGH (ref 6–23)
CALCIUM: 9.5 mg/dL (ref 8.4–10.5)
CO2: 22 mEq/L (ref 19–32)
Chloride: 101 mEq/L (ref 96–112)
Creatinine, Ser: 1.57 mg/dL — ABNORMAL HIGH (ref 0.50–1.35)
GFR calc Af Amer: 47 mL/min — ABNORMAL LOW (ref >90)
GFR calc non Af Amer: 41 mL/min — ABNORMAL LOW (ref >90)
GLUCOSE: 102 mg/dL — AB (ref 70–99)
Potassium: 4.9 mEq/L (ref 3.7–5.3)
SODIUM: 136 meq/L — AB (ref 137–147)
Total Bilirubin: 0.4 mg/dL (ref 0.3–1.2)
Total Protein: 6.8 g/dL (ref 6.0–8.3)

## 2014-02-12 LAB — POCT URINALYSIS DIPSTICK
BILIRUBIN UA: NEGATIVE
Blood, UA: NEGATIVE
GLUCOSE UA: NEGATIVE
KETONES UA: NEGATIVE
LEUKOCYTES UA: NEGATIVE
NITRITE UA: NEGATIVE
Protein, UA: NEGATIVE
Spec Grav, UA: 1.015
Urobilinogen, UA: 0.2
pH, UA: 5

## 2014-02-12 LAB — POCT CBC
Granulocyte percent: 67.5 %G (ref 37–80)
HCT, POC: 29 % — AB (ref 43.5–53.7)
HEMOGLOBIN: 9.1 g/dL — AB (ref 14.1–18.1)
LYMPH, POC: 1.4 (ref 0.6–3.4)
MCH: 31.1 pg (ref 27–31.2)
MCHC: 31.4 g/dL — AB (ref 31.8–35.4)
MCV: 99.1 fL — AB (ref 80–97)
MID (cbc): 0.5 (ref 0–0.9)
MPV: 6.8 fL (ref 0–99.8)
POC GRANULOCYTE: 3.8 (ref 2–6.9)
POC LYMPH %: 24.2 % (ref 10–50)
POC MID %: 8.3 %M (ref 0–12)
Platelet Count, POC: 210 10*3/uL (ref 142–424)
RBC: 2.93 M/uL — AB (ref 4.69–6.13)
RDW, POC: 17.1 %
WBC: 5.6 10*3/uL (ref 4.6–10.2)

## 2014-02-12 LAB — TYPE AND SCREEN
ABO/RH(D): B POS
Antibody Screen: NEGATIVE

## 2014-02-12 LAB — MRSA PCR SCREENING: MRSA by PCR: NEGATIVE

## 2014-02-12 LAB — CBC WITH DIFFERENTIAL/PLATELET
BASOS ABS: 0 10*3/uL (ref 0.0–0.1)
Basophils Relative: 0 % (ref 0–1)
EOS PCT: 3 % (ref 0–5)
Eosinophils Absolute: 0.2 10*3/uL (ref 0.0–0.7)
HCT: 26 % — ABNORMAL LOW (ref 39.0–52.0)
Hemoglobin: 8.7 g/dL — ABNORMAL LOW (ref 13.0–17.0)
Lymphocytes Relative: 24 % (ref 12–46)
Lymphs Abs: 1.2 10*3/uL (ref 0.7–4.0)
MCH: 31 pg (ref 26.0–34.0)
MCHC: 33.5 g/dL (ref 30.0–36.0)
MCV: 92.5 fL (ref 78.0–100.0)
Monocytes Absolute: 0.4 10*3/uL (ref 0.1–1.0)
Monocytes Relative: 8 % (ref 3–12)
Neutro Abs: 3.1 10*3/uL (ref 1.7–7.7)
Neutrophils Relative %: 65 % (ref 43–77)
PLATELETS: 192 10*3/uL (ref 150–400)
RBC: 2.81 MIL/uL — ABNORMAL LOW (ref 4.22–5.81)
RDW: 14.5 % (ref 11.5–15.5)
WBC: 4.8 10*3/uL (ref 4.0–10.5)

## 2014-02-12 LAB — IFOBT (OCCULT BLOOD): IFOBT: POSITIVE

## 2014-02-12 LAB — LACTIC ACID, PLASMA: LACTIC ACID, VENOUS: 0.8 mmol/L (ref 0.5–2.2)

## 2014-02-12 LAB — PROTIME-INR
INR: 1.37 (ref 0.00–1.49)
PROTHROMBIN TIME: 16.9 s — AB (ref 11.6–15.2)

## 2014-02-12 LAB — POCT UA - MICROSCOPIC ONLY
BACTERIA, U MICROSCOPIC: NEGATIVE
Casts, Ur, LPF, POC: NEGATIVE
Crystals, Ur, HPF, POC: NEGATIVE
Epithelial cells, urine per micros: NEGATIVE
MUCUS UA: NEGATIVE
RBC, URINE, MICROSCOPIC: NEGATIVE
WBC, Ur, HPF, POC: NEGATIVE
Yeast, UA: NEGATIVE

## 2014-02-12 LAB — TROPONIN I

## 2014-02-12 MED ORDER — NITROGLYCERIN 0.4 MG SL SUBL: 0.4000 mg | SUBLINGUAL_TABLET | SUBLINGUAL | Status: DC | PRN

## 2014-02-12 MED ORDER — PANTOPRAZOLE SODIUM 40 MG IV SOLR
8.0000 mg/h | INTRAVENOUS | Status: DC
  Administered 2014-02-12 – 2014-02-14 (×5): 8 mg/h via INTRAVENOUS
  Filled 2014-02-12 (×6): qty 80

## 2014-02-12 MED ORDER — THIAMINE HCL 100 MG/ML IJ SOLN
100.0000 mg | Freq: Every day | INTRAMUSCULAR | Status: DC
  Filled 2014-02-12 (×2): qty 1

## 2014-02-12 MED ORDER — ACETAMINOPHEN 650 MG RE SUPP
650.0000 mg | Freq: Four times a day (QID) | RECTAL | Status: DC | PRN
Start: 2014-02-12 — End: 2014-02-14

## 2014-02-12 MED ORDER — LORAZEPAM 2 MG/ML IJ SOLN: 1.0000 mg | Freq: Four times a day (QID) | INTRAMUSCULAR | Status: DC | PRN

## 2014-02-12 MED ORDER — FOLIC ACID 1 MG PO TABS
1.0000 mg | ORAL_TABLET | Freq: Every day | ORAL | Status: DC
  Administered 2014-02-13 – 2014-02-14 (×2): 1 mg via ORAL
  Filled 2014-02-12 (×2): qty 1

## 2014-02-12 MED ORDER — ADULT MULTIVITAMIN W/MINERALS CH
1.0000 | ORAL_TABLET | Freq: Every day | ORAL | Status: DC
Start: 2014-02-13 — End: 2014-02-14
  Administered 2014-02-13 – 2014-02-14 (×2): 1 via ORAL
  Filled 2014-02-12 (×2): qty 1

## 2014-02-12 MED ORDER — SODIUM CHLORIDE 0.9 % IV SOLN
INTRAVENOUS | Status: AC
  Administered 2014-02-12 – 2014-02-13 (×2): via INTRAVENOUS

## 2014-02-12 MED ORDER — AMIODARONE HCL 100 MG PO TABS
100.0000 mg | ORAL_TABLET | Freq: Every day | ORAL | Status: DC
  Administered 2014-02-14: 100 mg via ORAL
  Filled 2014-02-12 (×2): qty 1

## 2014-02-12 MED ORDER — LEVOTHYROXINE SODIUM 100 MCG PO TABS
100.0000 ug | ORAL_TABLET | Freq: Every day | ORAL | Status: DC
  Administered 2014-02-13: 100 ug via ORAL
  Filled 2014-02-12 (×2): qty 1

## 2014-02-12 MED ORDER — ONDANSETRON HCL 4 MG PO TABS: 4.0000 mg | ORAL_TABLET | Freq: Four times a day (QID) | ORAL | Status: DC | PRN

## 2014-02-12 MED ORDER — ONDANSETRON HCL 4 MG/2ML IJ SOLN
4.0000 mg | Freq: Four times a day (QID) | INTRAMUSCULAR | Status: DC | PRN
Start: 2014-02-12 — End: 2014-02-14

## 2014-02-12 MED ORDER — ACETAMINOPHEN 325 MG PO TABS: 650.0000 mg | ORAL_TABLET | Freq: Four times a day (QID) | ORAL | Status: DC | PRN

## 2014-02-12 MED ORDER — ALBUTEROL SULFATE (2.5 MG/3ML) 0.083% IN NEBU: 2.5000 mg | INHALATION_SOLUTION | Freq: Four times a day (QID) | RESPIRATORY_TRACT | Status: DC | PRN

## 2014-02-12 MED ORDER — VITAMIN B-1 100 MG PO TABS
100.0000 mg | ORAL_TABLET | Freq: Every day | ORAL | Status: DC
  Administered 2014-02-13 – 2014-02-14 (×2): 100 mg via ORAL
  Filled 2014-02-12 (×2): qty 1

## 2014-02-12 MED ORDER — ALLOPURINOL 300 MG PO TABS
300.0000 mg | ORAL_TABLET | Freq: Every day | ORAL | Status: DC
  Administered 2014-02-13 – 2014-02-14 (×2): 300 mg via ORAL
  Filled 2014-02-12 (×2): qty 1

## 2014-02-12 MED ORDER — ALBUTEROL SULFATE HFA 108 (90 BASE) MCG/ACT IN AERS: 2.0000 | INHALATION_SPRAY | Freq: Four times a day (QID) | RESPIRATORY_TRACT | Status: DC | PRN

## 2014-02-12 MED ORDER — TIOTROPIUM BROMIDE MONOHYDRATE 18 MCG IN CAPS
18.0000 ug | ORAL_CAPSULE | Freq: Every day | RESPIRATORY_TRACT | Status: DC
Start: 2014-02-13 — End: 2014-02-14
  Administered 2014-02-13 – 2014-02-14 (×2): 18 ug via RESPIRATORY_TRACT
  Filled 2014-02-12: qty 5

## 2014-02-12 MED ORDER — PANTOPRAZOLE SODIUM 40 MG IV SOLR: 40.0000 mg | Freq: Two times a day (BID) | INTRAVENOUS | Status: DC

## 2014-02-12 MED ORDER — LORAZEPAM 0.5 MG PO TABS: 1.0000 mg | ORAL_TABLET | Freq: Four times a day (QID) | ORAL | Status: DC | PRN

## 2014-02-12 MED ORDER — SODIUM CHLORIDE 0.9 % IV SOLN
80.0000 mg | Freq: Once | INTRAVENOUS | Status: AC
  Administered 2014-02-12: 80 mg via INTRAVENOUS
  Filled 2014-02-12: qty 80

## 2014-02-12 NOTE — Progress Notes (Addendum)
[facility] [address] [place] [address] [phone]  Date:  02/12/2014   Name:  Omar Benson   DOB:  [birth year]   MRN:  [id number]  PCP:  [name], MD    Chief Complaint: Dizziness and Fatigue   History of Present Illness:  Omar Benson is a 79 y.o. very pleasant male patient who presents with the following:  Past week has fatigue.  Today lightheaded.  No increase in ectopy.  No chest pain. Says he was on the treadmill last night for a short timae and his HR went up to 86 which is unusual for him.  He had increase in shortness of breath No nausea or vomiting.  No GI bleeding.  No stool change, cough or shortness of breath COPD is stable. Concerned that he took off a 'bunch' of ticks this summer. No improvement with over the counter medications or other home remedies. Denies other complaint or health concern today.   Patient Active Problem List   Diagnosis Date Noted  . Skin lesion of face 12/31/2013  . Enlarged salivary gland 12/31/2013  . Right shoulder pain 06/27/2013  . Rash and nonspecific skin eruption 06/27/2013  . Erectile dysfunction 09/13/2012  . Acute sinus infection 08/22/2012  . Allergic rhinitis, cause unspecified 08/22/2012  . GI AVM (gastrointestinal arteriovenous vascular malformation) 04/28/2012  . Stricture and stenosis of esophagus 04/28/2012  . GI bleed 04/26/2012  . Non-STEMI (non-ST elevated myocardial infarction) 05/24/2011  . COPD (chronic obstructive pulmonary disease) 09/08/2010  . Hypothyroid 08/21/2010  . Preventative health care 08/21/2010  . GASTRITIS 07/21/2010  . ANXIETY 06/13/2010  . NEPHROLITHIASIS, HX OF 01/11/2009  . CAD 12/05/2008  . ATRIAL FLUTTER, CHRONIC 12/05/2008  . BRADYCARDIA 12/05/2008  . HYPERLIPIDEMIA 09/19/2008  . GOUT 09/19/2008  . ANEMIA-IRON DEFICIENCY 09/19/2008  . HYPERTENSION 09/19/2008  . AAA 09/19/2008  . PERIPHERAL VASCULAR DISEASE 09/19/2008  . GERD 09/19/2008  . DIVERTICULOSIS,  COLON 09/19/2008  . CKD (chronic kidney disease) 09/19/2008  . LOW BACK PAIN 09/19/2008  . PSA, INCREASED 09/19/2008  . COLONIC POLYPS, HX OF 09/19/2008  . Atrial fibrillation 04/19/2008  . GASTROINTESTINAL HEMORRHAGE, HX OF 04/19/2008    Past Medical History  Diagnosis Date  . Hypothyroidism 12/05/2008  . HYPERLIPIDEMIA 09/19/2008  . GOUT 09/19/2008  . ANEMIA-IRON DEFICIENCY 09/19/2008  . HYPERTENSION 09/19/2008  . CAD 12/05/2008    s/p CABG; NSTEMI 05/2011 - LHC 05/25/11: LAD occluded, LIMA-LAD patent, ostial circumflex 50%, AV circumflex 70%, SVG-OM2 with extensive disease with thrombus (culprit vessel), RCA 80% and occluded, SVG-intermediate patent, SVG-PDA/PL A patent.  Given that the culprit vessel was a subtotally occluded heavily thrombotic graft to a smaller OM2, medical therapy was recommended.;   . Atrial fibrillation 04/19/2008    amiodarone rx;  Echocardiogram 05/26/11: No wall motion abnormalities, mild LVH, EF 60%.  . Atrial flutter 12/05/2008    s/p RFCA  . BRADYCARDIA 12/05/2008  . AAA 09/19/2008  . PERIPHERAL VASCULAR DISEASE 09/19/2008  . GERD 09/19/2008  . DIVERTICULOSIS, COLON 09/19/2008  . RENAL INSUFFICIENCY 09/19/2008  . LOW BACK PAIN 09/19/2008  . PSA, INCREASED 09/19/2008  . COLONIC POLYPS, HX OF 09/19/2008    adenomatous polyps 07/2010  . GASTROINTESTINAL HEMORRHAGE, HX OF 04/19/2008  . NEPHROLITHIASIS, HX OF 01/11/2009  . CORONARY ARTERY BYPASS GRAFT, HX OF 12/05/2008    A. LIMA-LAD, VG-RI, VG-OM2, VG-RPD/RPL;  B. 05/2011 - NSTEMI - CATH WITH 4/5 PATENT GRAFTS AND NEW THROMBUS IN DISTAL VG-OM2 -  MED RX  . ANXIETY 06/13/2010  . Arthritis   . COPD (chronic obstructive pulmonary disease)   . Myocardial infarction   . Allergy   . Benign esophageal stricture 09/2075    dilated during EGD  . Erectile dysfunction 09/13/2012  . CKD (chronic kidney disease) 09/19/2008    Qualifier: Diagnosis of  By: Jenny Reichmann MD, Hunt Oris     Past Surgical History  Procedure Laterality Date  . Aorto-femoral bypass  x5 - stress test neg 9/08    . Coronary artery bypass graft    . S/p afib ablation  2004  . Cardiac catheterization  05/25/2011  . Colon surgery    . Esophagogastroduodenoscopy  04/28/2012    Procedure: ESOPHAGOGASTRODUODENOSCOPY (EGD);  Surgeon: [name], MD;  Location: [facility];  Service: Endoscopy;  Laterality: N/A;    History  Substance Use Topics  . Smoking status: Former Smoker -- 4.00 packs/day for 31 years    Types: Cigarettes    Quit date: 03/19/1983  . Smokeless tobacco: Never Used  . Alcohol Use: 6.0 oz/week    10 Shots of liquor per week    Family History  Problem Relation Age of Onset  . Diabetes Father   . Diabetes Sister   . Multiple myeloma Mother   . Cancer Mother   . Other Mother     varicose veins  . Heart disease Paternal Uncle   . Heart disease Maternal Uncle   . Lymphoma Sister     half-sister    Allergies  Allergen Reactions  . Bee Venom Anaphylaxis    Only yellow jackets   . Other Nausea And Vomiting    Seafood-diarrhea  . Crestor [Rosuvastatin]     Myalgia/myopathy  . Doxycycline Hyclate Other (See Comments)    esophagitis  . Lipitor [Atorvastatin Calcium] Other (See Comments)    Myalgia/myopathy  . Promethazine Hcl Other (See Comments)    syncope  . Tetracycline Other (See Comments)    Esophagitis  . Tetanus Toxoid Rash    Medication list has been reviewed and updated.  Current Outpatient Prescriptions on File Prior to Visit  Medication Sig Dispense Refill  . albuterol (PROVENTIL HFA;VENTOLIN HFA) 108 (90 BASE) MCG/ACT inhaler Inhale 2 puffs into the lungs every 6 (six) hours as needed for wheezing.  1 Inhaler  6  . allopurinol (ZYLOPRIM) 300 MG tablet TAKE 1 TABLET BY MOUTH EVERY DAY  90 tablet  3  . amiodarone (PACERONE) 100 MG tablet Take 1 tablet (100 mg total) by mouth daily.  90 tablet  3  . aspirin EC 81 MG tablet Take 1 tablet (81 mg total) by mouth daily.      . isosorbide mononitrate (IMDUR) 30 MG 24 hr tablet  TAKE 1 TABLET BY MOUTH EVERY DAY  90 tablet  3  . Multiple Vitamin (MULTIVITAMIN) tablet Take 1 tablet by mouth at bedtime.       . pantoprazole (PROTONIX) 40 MG tablet TAKE 1 TABLET BY MOUTH EVERY DAY  90 tablet  3  . Rivaroxaban (XARELTO) 20 MG TABS tablet Take 1 tablet (20 mg total) by mouth daily with supper.  90 tablet  3  . SYNTHROID 100 MCG tablet TAKE 1 TABLET BY MOUTH EVERY DAY  90 tablet  3  . tiotropium (SPIRIVA HANDIHALER) 18 MCG inhalation capsule Place 1 capsule (18 mcg total) into inhaler and inhale daily.  90 capsule  3  . EPINEPHrine (EPIPEN) 0.3 mg/0.3 mL SOAJ injection Inject 0.3 mLs (0.3 mg  total) into the muscle once.  2 Device  2  . nitroGLYCERIN (NITROSTAT) 0.4 MG SL tablet Place 1 tablet (0.4 mg total) under the tongue every 5 (five) minutes as needed for chest pain.  25 tablet  5   No current facility-administered medications on file prior to visit.    Review of Systems:   As per HPI, otherwise negative.    Physical Examination: Filed Vitals:   02/12/14 1552  BP: 122/74  Pulse: 65  Temp: 98 F (36.7 C)  Resp: 17   Filed Vitals:   02/12/14 1552  Height: $Remove'5\' 9"'TeVusFG$  (1.753 m)  Weight: 183 lb (83.008 kg)   Body mass index is 27.01 kg/(m^2). Ideal Body Weight: Weight in (lb) to have BMI = 25: 168.9  GEN: WDWN, NAD, Non-toxic, A & O x 3 HEENT: Atraumatic, Normocephalic. Neck supple. No masses, No LAD. Ears and Nose: No external deformity. CV: RRR, No M/G/R. No JVD. No thrill. No extra heart sounds. PULM: CTA B, no wheezes, crackles, rhonchi. No retractions. No resp. distress. No accessory muscle use. ABD: S, NT, ND, +BS. No rebound. No HSM. EXTR: No c/c/e NEURO Normal gait.  PSYCH: Normally interactive. Conversant. Not depressed or anxious appearing.  Calm demeanor.  DRE  Black stool  Assessment and Plan: GI bleed Fatigue 4 gram Hb drop in last month Admit to GI Dr Hilarie Fredrickson via hospitalist   Signed,  Ellison Carwin, MD   Results for orders placed  in visit on 02/12/14  POCT CBC      Result Value Ref Range   WBC 5.6  4.6 - 10.2 K/uL   Lymph, poc 1.4  0.6 - 3.4   POC LYMPH PERCENT 24.2  10 - 50 %L   MID (cbc) 0.5  0 - 0.9   POC MID % 8.3  0 - 12 %M   POC Granulocyte 3.8  2 - 6.9   Granulocyte percent 67.5  37 - 80 %G   RBC 2.93 (*) 4.69 - 6.13 M/uL   Hemoglobin 9.1 (*) 14.1 - 18.1 g/dL   HCT, POC 29.0 (*) 43.5 - 53.7 %   MCV 99.1 (*) 80 - 97 fL   MCH, POC 31.1  27 - 31.2 pg   MCHC 31.4 (*) 31.8 - 35.4 g/dL   RDW, POC 17.1     Platelet Count, POC 210  142 - 424 K/uL   MPV 6.8  0 - 99.8 fL  POCT URINALYSIS DIPSTICK      Result Value Ref Range   Color, UA yellow     Clarity, UA clear     Glucose, UA neg     Bilirubin, UA neg     Ketones, UA neg     Spec Grav, UA 1.015     Blood, UA neg     pH, UA 5.0     Protein, UA neg     Urobilinogen, UA 0.2     Nitrite, UA neg     Leukocytes, UA Negative    POCT UA - MICROSCOPIC ONLY      Result Value Ref Range   WBC, Ur, HPF, POC neg     RBC, urine, microscopic neg     Bacteria, U Microscopic neg     Mucus, UA neg     Epithelial cells, urine per micros neg     Crystals, Ur, HPF, POC neg     Casts, Ur, LPF, POC neg     Yeast, UA neg  IFOBT (OCCULT BLOOD)      Result Value Ref Range   IFOBT Positive

## 2014-02-12 NOTE — Evaluation (Signed)
Received call from about pt with GIB at [facility]. Apparently not a [place] pt.  On xarelto for afib. Hgb 9.1. hemodyanically stable. PA at [place] spoke with Gi-Pyrtle. Recommending medical admission. Will accept to step down bed. Consult Pyrtle/group once pt arrives.

## 2014-02-12 NOTE — H&P (Addendum)
[facility] History and Physical  Omar Benson GEX:[id number] DOB: 09/08/1934 DOA: 02/12/2014  Referring physician: Direct admission from urgent care. PCP: [name], MD   Chief Complaint: Weakness.  HPI: Omar Benson is a 79 y.o. male with history of CAD status post CABG and stenting, atrial fibrillation on xarelto, COPD, hypothyroidism has been experiencing weakness over the last 2 days. Patient states that when he was doing his regular treadmill walking he saw that his heart rate was racing more than usual and patient was getting easily fatigued. This was going on for last 2 days and patient presented today urgent care center where blood work showed hemoglobin around 9 which was drop of oh grams from recent last month. Patient also noticed dark stools. Fecal occult blood was positive. Patient denies any chest pain but did have mild shortness of breath which patient states chronic from his COPD. Patient at this time has been admitted for acute GI bleed. Patient has had previous episode of GI bleed in 2013 where EGD showed AV malformation. Patient's last dose of xarelto was more than 48 hours ago. Patient also has not taken his aspirin since patient was concerned about GI bleed. Patient denies any nausea vomiting abdominal pain fever chills or any recent use of NSAIDs. Dr. Hilarie Fredrickson on-call gastroenterologist was consulted and I also have discussed with Dr. Hilarie Fredrickson about the admission.   Review of Systems: As presented in the history of presenting illness, rest negative.  Past Medical History  Diagnosis Date  . Hypothyroidism 12/05/2008  . HYPERLIPIDEMIA 09/19/2008  . GOUT 09/19/2008  . ANEMIA-IRON DEFICIENCY 09/19/2008  . HYPERTENSION 09/19/2008  . CAD 12/05/2008    s/p CABG; NSTEMI 05/2011 - LHC 05/25/11: LAD occluded, LIMA-LAD patent, ostial circumflex 50%, AV circumflex 70%, SVG-OM2 with extensive disease with thrombus (culprit vessel), RCA 80% and occluded, SVG-intermediate patent, SVG-PDA/PL A  patent.  Given that the culprit vessel was a subtotally occluded heavily thrombotic graft to a smaller OM2, medical therapy was recommended.;   . Atrial fibrillation 04/19/2008    amiodarone rx;  Echocardiogram 05/26/11: No wall motion abnormalities, mild LVH, EF 60%.  . Atrial flutter 12/05/2008    s/p RFCA  . BRADYCARDIA 12/05/2008  . AAA 09/19/2008  . PERIPHERAL VASCULAR DISEASE 09/19/2008  . GERD 09/19/2008  . DIVERTICULOSIS, COLON 09/19/2008  . RENAL INSUFFICIENCY 09/19/2008  . LOW BACK PAIN 09/19/2008  . PSA, INCREASED 09/19/2008  . COLONIC POLYPS, HX OF 09/19/2008    adenomatous polyps 07/2010  . GASTROINTESTINAL HEMORRHAGE, HX OF 04/19/2008  . NEPHROLITHIASIS, HX OF 01/11/2009  . CORONARY ARTERY BYPASS GRAFT, HX OF 12/05/2008    A. LIMA-LAD, VG-RI, VG-OM2, VG-RPD/RPL;  B. 05/2011 - NSTEMI - CATH WITH 4/5 PATENT GRAFTS AND NEW THROMBUS IN DISTAL VG-OM2 - MED RX  . ANXIETY 06/13/2010  . Arthritis   . COPD (chronic obstructive pulmonary disease)   . Myocardial infarction   . Allergy   . Benign esophageal stricture 09/2075    dilated during EGD  . Erectile dysfunction 09/13/2012  . CKD (chronic kidney disease) 09/19/2008    Qualifier: Diagnosis of  By: Jenny Reichmann MD, Hunt Oris    Past Surgical History  Procedure Laterality Date  . Aorto-femoral bypass x5 - stress test neg 9/08    . Coronary artery bypass graft    . S/p afib ablation  2004  . Cardiac catheterization  05/25/2011  . Colon surgery    . Esophagogastroduodenoscopy  04/28/2012    Procedure: ESOPHAGOGASTRODUODENOSCOPY (EGD);  Surgeon:  [name], MD;  Location: [facility];  Service: Endoscopy;  Laterality: N/A;   Social History:  reports that he quit smoking about 30 years ago. His smoking use included Cigarettes. He has a 124 pack-year smoking history. He has never used smokeless tobacco. He reports that he drinks about 6 ounces of alcohol per week. He reports that he does not use illicit drugs. Where does patient live home. Can patient  participate in ADLs? Yes.  Allergies  Allergen Reactions  . Bee Venom Anaphylaxis    Only yellow jackets   . Other Nausea And Vomiting    Seafood-diarrhea  . Crestor [Rosuvastatin]     Myalgia/myopathy  . Doxycycline Hyclate Other (See Comments)    esophagitis  . Lipitor [Atorvastatin Calcium] Other (See Comments)    Myalgia/myopathy  . Promethazine Hcl Other (See Comments)    syncope  . Tetracycline Other (See Comments)    Esophagitis  . Tetanus Toxoid Rash    Family History:  Family History  Problem Relation Age of Onset  . Diabetes Father   . Diabetes Sister   . Multiple myeloma Mother   . Cancer Mother   . Other Mother     varicose veins  . Heart disease Paternal Uncle   . Heart disease Maternal Uncle   . Lymphoma Sister     half-sister      Prior to Admission medications   Medication Sig Start Date End Date Taking? Authorizing Provider  albuterol (PROVENTIL HFA;VENTOLIN HFA) 108 (90 BASE) MCG/ACT inhaler Inhale 2 puffs into the lungs every 6 (six) hours as needed for wheezing. 11/23/12   Kathee Delton, MD  allopurinol (ZYLOPRIM) 300 MG tablet TAKE 1 TABLET BY MOUTH EVERY DAY 01/29/14   Biagio Borg, MD  amiodarone (PACERONE) 100 MG tablet Take 1 tablet (100 mg total) by mouth daily. 08/22/12   Biagio Borg, MD  aspirin EC 81 MG tablet Take 1 tablet (81 mg total) by mouth daily. 05/04/12   Cherene Altes, MD  EPINEPHrine (EPIPEN) 0.3 mg/0.3 mL SOAJ injection Inject 0.3 mLs (0.3 mg total) into the muscle once. 02/01/13   Darreld Mclean, MD  isosorbide mononitrate (IMDUR) 30 MG 24 hr tablet TAKE 1 TABLET BY MOUTH EVERY DAY    Biagio Borg, MD  Multiple Vitamin (MULTIVITAMIN) tablet Take 1 tablet by mouth at bedtime.     Historical Provider, MD  nitroGLYCERIN (NITROSTAT) 0.4 MG SL tablet Place 1 tablet (0.4 mg total) under the tongue every 5 (five) minutes as needed for chest pain. 12/26/13 09/16/15  Biagio Borg, MD  pantoprazole (PROTONIX) 40 MG tablet TAKE 1 TABLET BY  MOUTH EVERY DAY    Biagio Borg, MD  Rivaroxaban (XARELTO) 20 MG TABS tablet Take 1 tablet (20 mg total) by mouth daily with supper. 04/25/13   Evans Lance, MD  SYNTHROID 100 MCG tablet TAKE 1 TABLET BY MOUTH EVERY DAY 01/02/14   Biagio Borg, MD  tiotropium (SPIRIVA HANDIHALER) 18 MCG inhalation capsule Place 1 capsule (18 mcg total) into inhaler and inhale daily. 05/02/13   Kathee Delton, MD    Physical Exam: Filed Vitals:   02/12/14 2000  BP: 137/65  Pulse: 93  Temp: 97.9 F (36.6 C)  TempSrc: Oral  Resp: 20  Height: $Remove'5\' 9"'fOjXHMC$  (1.753 m)  Weight: 85.2 kg (187 lb 13.3 oz)  SpO2: 96%     General:  Well-developed and nourished.  Eyes: Pallor present no icterus.  ENT:  No discharge from the ears eyes nose mouth.  Neck: No mass felt.  Cardiovascular: S1-S2 heard.  Respiratory: No rhonchi or crepitations.  Abdomen: Soft nontender bowel sounds present. No guarding rigidity.  Skin: No rash.  Musculoskeletal: No edema.  Psychiatric: Appears normal.  Neurologic: Alert awake oriented to time place and person. Moves all extremities 5 x 5.  Labs on Admission:  Basic Metabolic Panel: No results found for this basename: NA, K, CL, CO2, GLUCOSE, BUN, CREATININE, CALCIUM, MG, PHOS,  in the last 168 hours Liver Function Tests: No results found for this basename: AST, ALT, ALKPHOS, BILITOT, PROT, ALBUMIN,  in the last 168 hours No results found for this basename: LIPASE, AMYLASE,  in the last 168 hours No results found for this basename: AMMONIA,  in the last 168 hours CBC:  Recent Labs Lab 02/12/14 1652  WBC 5.6  HGB 9.1*  HCT 29.0*  MCV 99.1*   Cardiac Enzymes: No results found for this basename: CKTOTAL, CKMB, CKMBINDEX, TROPONINI,  in the last 168 hours  BNP (last 3 results) No results found for this basename: PROBNP,  in the last 8760 hours CBG: No results found for this basename: GLUCAP,  in the last 168 hours  Radiological Exams on Admission: No results  found.   Assessment/Plan Principal Problem:   Acute GI bleeding Active Problems:   CAD   Hypothyroid   Acute blood loss anemia   1. Acute GI bleed - most likely upper GI. Patient has had previous history of AVMs in the upper GI. Patient has not taken his xarelto for more than 48 hours now along with aspirin. At this time we will keep patient n.p.o. and on Protonix infusion and closely follow CBC and if there is further decrease in hemoglobin less than 8 (history of CABG) or if patient becomes hypotensive we will transfuse PRBC. Xarelto and aspirin is on hold and patient is agreeable with holding xarelto and aspirin with known risk for stroke. GI to see in consult. I did discuss with Dr. Hilarie Fredrickson. 2. Acute blood loss anemia - closely follow CBC. See #1. 3. CAD status post CABG and stenting - see #1. Denies any chest pain at this time. 4. History of atrial fibrillation presently in sinus rhythm on the monitor - on amiodarone. Xarelto on hold see #1. 5. COPD - presently not wheezing. Continue inhalers. 6. Hypothyroidism - continue Synthroid. 7. CKD - creatinine at baseline. Closely follow.    Code Status: Full code.  Family Communication: Patient's wife at the bedside.  Disposition Plan: Admit to inpatient.    Laniyah Rosenwald N. [facility] Pager [phone].  If 7PM-7AM, please contact night-coverage [email or web address] Password [facility] 02/12/2014, 8:45 PM

## 2014-02-13 ENCOUNTER — Encounter (HOSPITAL_COMMUNITY)
Admission: AD | Disposition: A | Discharge status: Home / Self Care | Payer: Self-pay | Source: Ambulatory Visit | Attending: Internal Medicine

## 2014-02-13 ENCOUNTER — Encounter (HOSPITAL_COMMUNITY): Payer: Self-pay

## 2014-02-13 DIAGNOSIS — I10 Essential (primary) hypertension: Secondary | ICD-10-CM

## 2014-02-13 DIAGNOSIS — Q2733 Arteriovenous malformation of digestive system vessel: Secondary | ICD-10-CM

## 2014-02-13 DIAGNOSIS — I4891 Unspecified atrial fibrillation: Secondary | ICD-10-CM

## 2014-02-13 DIAGNOSIS — R7989 Other specified abnormal findings of blood chemistry: Secondary | ICD-10-CM | POA: Diagnosis present

## 2014-02-13 DIAGNOSIS — K5521 Angiodysplasia of colon with hemorrhage: Secondary | ICD-10-CM | POA: Diagnosis present

## 2014-02-13 DIAGNOSIS — J441 Chronic obstructive pulmonary disease with (acute) exacerbation: Secondary | ICD-10-CM

## 2014-02-13 DIAGNOSIS — N189 Chronic kidney disease, unspecified: Secondary | ICD-10-CM

## 2014-02-13 HISTORY — PX: ENTEROSCOPY: SHX5533

## 2014-02-13 LAB — COMPREHENSIVE METABOLIC PANEL
ALBUMIN: 3.2 g/dL — AB (ref 3.5–5.2)
ALK PHOS: 70 U/L (ref 39–117)
ALK PHOS: 70 U/L (ref 39–117)
ALT: 12 U/L (ref 0–53)
ALT: 11 U/L (ref 0–53)
AST: 15 U/L (ref 0–37)
AST: 14 U/L (ref 0–37)
Albumin: 4 g/dL (ref 3.5–5.2)
Anion gap: 10 (ref 5–15)
BILIRUBIN TOTAL: 0.5 mg/dL (ref 0.2–1.2)
BUN: 37 mg/dL — ABNORMAL HIGH (ref 6–23)
BUN: 34 mg/dL — ABNORMAL HIGH (ref 6–23)
CO2: 25 meq/L (ref 19–32)
CO2: 25 mEq/L (ref 19–32)
CREATININE: 1.59 mg/dL — AB (ref 0.50–1.35)
Calcium: 9.8 mg/dL (ref 8.4–10.5)
Calcium: 9 mg/dL (ref 8.4–10.5)
Chloride: 104 mEq/L (ref 96–112)
Chloride: 105 mEq/L (ref 96–112)
Creatinine, Ser: 1.6 mg/dL — ABNORMAL HIGH (ref 0.50–1.35)
GFR calc non Af Amer: 40 mL/min — ABNORMAL LOW (ref >90)
GFR, EST AFRICAN AMERICAN: 46 mL/min — AB (ref >90)
Glucose, Bld: 108 mg/dL — ABNORMAL HIGH (ref 70–99)
Glucose, Bld: 95 mg/dL (ref 70–99)
POTASSIUM: 4.6 meq/L (ref 3.7–5.3)
Potassium: 4.8 mEq/L (ref 3.5–5.3)
SODIUM: 136 meq/L (ref 135–145)
SODIUM: 140 meq/L (ref 137–147)
TOTAL PROTEIN: 6.6 g/dL (ref 6.0–8.3)
TOTAL PROTEIN: 6.2 g/dL (ref 6.0–8.3)
Total Bilirubin: 0.4 mg/dL (ref 0.3–1.2)

## 2014-02-13 LAB — CBC
HCT: 24.9 % — ABNORMAL LOW (ref 39.0–52.0)
HEMATOCRIT: 26.8 % — AB (ref 39.0–52.0)
HEMATOCRIT: 24.7 % — AB (ref 39.0–52.0)
Hemoglobin: 8.2 g/dL — ABNORMAL LOW (ref 13.0–17.0)
Hemoglobin: 8.3 g/dL — ABNORMAL LOW (ref 13.0–17.0)
Hemoglobin: 8.9 g/dL — ABNORMAL LOW (ref 13.0–17.0)
MCH: 32 pg (ref 26.0–34.0)
MCH: 30.5 pg (ref 26.0–34.0)
MCH: 31.2 pg (ref 26.0–34.0)
MCHC: 33.2 g/dL (ref 30.0–36.0)
MCHC: 32.9 g/dL (ref 30.0–36.0)
MCHC: 33.6 g/dL (ref 30.0–36.0)
MCV: 92.6 fL (ref 78.0–100.0)
MCV: 96.4 fL (ref 78.0–100.0)
MCV: 92.9 fL (ref 78.0–100.0)
PLATELETS: 184 10*3/uL (ref 150–400)
PLATELETS: 187 10*3/uL (ref 150–400)
Platelets: 200 10*3/uL (ref 150–400)
RBC: 2.66 MIL/uL — ABNORMAL LOW (ref 4.22–5.81)
RBC: 2.69 MIL/uL — ABNORMAL LOW (ref 4.22–5.81)
RBC: 2.78 MIL/uL — ABNORMAL LOW (ref 4.22–5.81)
RDW: 14.9 % (ref 11.5–15.5)
RDW: 14.6 % (ref 11.5–15.5)
RDW: 14.7 % (ref 11.5–15.5)
WBC: 4.3 10*3/uL (ref 4.0–10.5)
WBC: 4.4 10*3/uL (ref 4.0–10.5)
WBC: 5.4 10*3/uL (ref 4.0–10.5)

## 2014-02-13 LAB — TSH: TSH: 8.283 u[IU]/mL — AB (ref 0.350–4.500)

## 2014-02-13 LAB — GLUCOSE, CAPILLARY
Glucose-Capillary: 94 mg/dL (ref 70–99)
Glucose-Capillary: 112 mg/dL — ABNORMAL HIGH (ref 70–99)
Glucose-Capillary: 148 mg/dL — ABNORMAL HIGH (ref 70–99)

## 2014-02-13 LAB — T4, FREE: FREE T4: 1.24 ng/dL (ref 0.80–1.80)

## 2014-02-13 SURGERY — ENTEROSCOPY
Anesthesia: Moderate Sedation

## 2014-02-13 MED ORDER — LEVOTHYROXINE SODIUM 125 MCG PO TABS: 125.0000 ug | ORAL_TABLET | Freq: Every day | ORAL | Status: DC

## 2014-02-13 MED ORDER — MIDAZOLAM HCL 10 MG/2ML IJ SOLN
INTRAMUSCULAR | Status: DC | PRN
  Administered 2014-02-13: 1 mg via INTRAVENOUS
  Administered 2014-02-13: 2 mg via INTRAVENOUS
  Administered 2014-02-13: 1 mg via INTRAVENOUS
  Administered 2014-02-13: 2 mg via INTRAVENOUS
  Administered 2014-02-13 (×2): 1 mg via INTRAVENOUS

## 2014-02-13 MED ORDER — MENTHOL 3 MG MT LOZG
1.0000 | LOZENGE | OROMUCOSAL | Status: DC | PRN
  Filled 2014-02-13: qty 9

## 2014-02-13 MED ORDER — LEVOTHYROXINE SODIUM 125 MCG PO TABS
125.0000 ug | ORAL_TABLET | Freq: Every day | ORAL | Status: DC
  Administered 2014-02-14: 125 ug via ORAL
  Filled 2014-02-13 (×2): qty 1

## 2014-02-13 MED ORDER — FENTANYL CITRATE 0.05 MG/ML IJ SOLN
INTRAMUSCULAR | Status: AC
  Filled 2014-02-13: qty 2

## 2014-02-13 MED ORDER — DIPHENHYDRAMINE HCL 50 MG/ML IJ SOLN
INTRAMUSCULAR | Status: AC
  Filled 2014-02-13: qty 1

## 2014-02-13 MED ORDER — BUTAMBEN-TETRACAINE-BENZOCAINE 2-2-14 % EX AERO
INHALATION_SPRAY | CUTANEOUS | Status: DC | PRN
  Administered 2014-02-13: 2 via TOPICAL

## 2014-02-13 MED ORDER — SODIUM CHLORIDE 0.9 % IV SOLN
INTRAVENOUS | Status: DC
  Administered 2014-02-13: via INTRAVENOUS

## 2014-02-13 MED ORDER — FENTANYL CITRATE 0.05 MG/ML IJ SOLN
INTRAMUSCULAR | Status: DC | PRN
  Administered 2014-02-13 (×3): 25 ug via INTRAVENOUS

## 2014-02-13 MED ORDER — MIDAZOLAM HCL 5 MG/ML IJ SOLN
INTRAMUSCULAR | Status: AC
  Filled 2014-02-13: qty 2

## 2014-02-13 NOTE — Procedures (Signed)
Small bowel enteroscopy performed today.  Procedure software unavailable and thus pictures were unable to be obtained. The procedure was performed without difficulty or complication. Referring provider --  [facility], Dr. Thereasa Solo Medications -- fentanyl 75 mcg IV, midazolam IM 8 mg IV, 2 sprays of Cetacaine Indication -- heme positive stool, melena, acute posthemorrhagic anemia, history of small bowel angiodysplastic lesion, chronic anticoagulation  After the risks benefits and alternatives of the procedure were thoroughly explained, informed  consent was obtained. The Pentax pediatric colonoscope was introduced through the mouth and advanced to the proximal jejunum.  The instrument was then slowly withdrawn as the small bowel, stomach and esophagus was fully examined.  Findings: Esophagus -- normal esophageal mucosa, small hiatal hernia.  Stomach -- normal gastric mucosa without evidence of inflammation or angioectasia. Retroflexed views revealed small hiatal hernia without Cameron's lesions  Duodenum -- normal appearing duodenal mucosa. No evidence of active or recent bleeding. No duodenal angiodysplastic lesions seen  Jejunum -- the jejunum was examined to the extent of the pediatric colonoscope. In the very proximal jejunum there was a single 4-5 mm angiodysplastic lesion which was oozing. This was treated with APC ablation, 0.5 L per minute, 41 W, with success. The lesion was ablated completely with cessation of bleeding. No further angiodysplastic lesion seen in the examined jejunum.  Impression --  1.  jejunal angiodysplastic lesion, successfully treated with APC 2.  unremarkable esophagus, stomach and duodenal mucosa 3.  small hiatal hernia  Recommendations --  1.  Closely monitor Hgb, transfuse if necessary 2.  Would avoid reinstitution of anticoagulation for 48 hours if possible to allow time for healing of treated jejunal lesion 3.  If bleeding persists after treatment  today, would repeat video capsule endoscopy to look for more distal small bowel angioectasias 4.  Okay for advancing diet today

## 2014-02-13 NOTE — Plan of Care (Signed)
Problem: Phase I Progression Outcomes Goal: Hemodynamically stable Outcome: Progressing Vitals stable, will continue to monitor. Hemoglobin this evening at 1842 was slightly elevated at 8.9/26.8  from last checked level. No s/s bleeding.

## 2014-02-13 NOTE — Addendum Note (Signed)
Addended by: Roselee Culver on: 02/13/2014 09:41 AM   Modules accepted: Orders

## 2014-02-13 NOTE — Consult Note (Signed)
[facility] Consult: 8:32 AM 02/13/2014  LOS: 1 day    Referring Provider: Hal Hope  Primary Care Physician:  [name], MD Primary Gastroenterologist:  Dr. Deatra Ina (previously Sharlett Iles)   Reason for Consultation: anemia, FOBT +   HPI: Omar Benson is a 79 y.o. male.  Hx CAD, s/p CABG, s/p cardiac stenting.  On xarelto for A fib. CKD.  Hx anemia.  Hx of GI bleeding presumed secondary to AVMs, cauterixed jejunal bleeding in 2013.  Also has hx Adenomatous colon polyps, diverticulosis, esophageal stricture.   Presented to urgent care with a few days of increased fatigue.  Pulse rate faster than normal while using his treadmill. Stools dark for 2 or 3 days but not tarry, balck or melenic.  Pt realized he may be having another GIB so he stopped Xarelto after dose on Saturday, stopped ASA after Sunday.  Not presyncopal.  No dyspneic at rest.  No anorexia.  Taking Prevacid daily.  No heartburn, no dysphagia.   FOBT +, Hgb 9.1, 8.2 today. It was 13.3 on  12/26/13. MCV normal.  Both BUN and creatinine are elevated more than usual.      ENDOSCOPIC STUDIES: Enteroscopy 04/2012: area of active proximal jejunal bleeding, was injected and cauterized.  Presumably this area was an AVM.   07/2010 Colonoscopy Screening study  ENDOSCOPIC IMPRESSION:  1) Sessile polyp at the splenic flexure  2) Sessile polyp in the mid transverse colon  3) Severe diverticulosis in the sigmoid to descending colon  segments  4) Internal hemorrhoids  1. Surgical [P], transverse and descending colon  TUBULAR ADENOMA (TWO FRAGMENTS). NO HIGH GRADE DYSPLASIA OR MALIGNANCY  IDENTIFIED.  2. Surgical [P], gastric biopsy  BENIGN GASTRIC MUCOSA WITH CHRONIC GASTRITIS. NO INTESTINAL METAPLASIA OR  HELICOBACTER PYLORI ORGANISMS IDENTIFIED.   EGD  04/2008  A stricture was found in the distal esophagus. Dilitation Therapy # 11 f maloney dilator passed.some mild resistance.no heme. Retroflexed views revealed no abnormalities.  EGD 09/2005 for low ferritin.  Findings  - Normal: Proximal Esophagus to Distal Esophagus. Not Seen: Tumor.  Barrett's esophagus. Esophageal inflammation. Mucosal abnormality.  Stricture. Varices.  - OTHER FINDING: in Cardia. Comments: Atrophic flat mucosa and no  rugae and prominant vascular pattern seen in atrophic gastritis..  - Normal: Antrum to Duodenal 2nd Portion. Tumor. Ulcer. Mucosal  abnormality. AVM's. Foreign body. Biopsy/Normal taken. Comments: R/O  celiac disease....  ENDOSCOPIC BIOPSY, SMALL INTESTINE: BENIGN SMALL BOWEL MUCOSA.  NO ACTIVE INFLAMMATION OR VILLOUS ATROPHY IDENTIFIED.   Did not locate the capsule endo report.     Past Medical History  Diagnosis Date  . Hypothyroidism 12/05/2008  . HYPERLIPIDEMIA 09/19/2008  . GOUT 09/19/2008  . ANEMIA-IRON DEFICIENCY 09/19/2008  . HYPERTENSION 09/19/2008  . CAD 12/05/2008    s/p CABG; NSTEMI 05/2011 - LHC 05/25/11: LAD occluded, LIMA-LAD patent, ostial circumflex 50%, AV circumflex 70%, SVG-OM2 with extensive disease with thrombus (culprit vessel), RCA 80% and occluded, SVG-intermediate patent, SVG-PDA/PL A patent.  Given that the culprit vessel was a subtotally occluded heavily thrombotic graft to a smaller OM2,  medical therapy was recommended.;   . Atrial fibrillation 04/19/2008    amiodarone rx;  Echocardiogram 05/26/11: No wall motion abnormalities, mild LVH, EF 60%.  . Atrial flutter 12/05/2008    s/p RFCA  . BRADYCARDIA 12/05/2008  . AAA 09/19/2008  . PERIPHERAL VASCULAR DISEASE 09/19/2008  . GERD 09/19/2008  . DIVERTICULOSIS, COLON 09/19/2008  . RENAL INSUFFICIENCY 09/19/2008  . LOW BACK PAIN 09/19/2008  . PSA, INCREASED 09/19/2008  . COLONIC POLYPS, HX OF 09/19/2008    adenomatous polyps 07/2010  . GASTROINTESTINAL HEMORRHAGE, HX OF 04/19/2008  .  NEPHROLITHIASIS, HX OF 01/11/2009  . CORONARY ARTERY BYPASS GRAFT, HX OF 12/05/2008    A. LIMA-LAD, VG-RI, VG-OM2, VG-RPD/RPL;  B. 05/2011 - NSTEMI - CATH WITH 4/5 PATENT GRAFTS AND NEW THROMBUS IN DISTAL VG-OM2 - MED RX  . ANXIETY 06/13/2010  . Arthritis   . COPD (chronic obstructive pulmonary disease)   . Myocardial infarction   . Allergy   . Benign esophageal stricture 09/2075    dilated during EGD  . Erectile dysfunction 09/13/2012  . CKD (chronic kidney disease) 09/19/2008    Qualifier: Diagnosis of  By: Jenny Reichmann MD, Hunt Oris     Past Surgical History  Procedure Laterality Date  . Aorto-femoral bypass x5 - stress test neg 9/08    . Coronary artery bypass graft    . S/p afib ablation  2004  . Cardiac catheterization  05/25/2011  . Colon surgery    . Esophagogastroduodenoscopy  04/28/2012    Procedure: ESOPHAGOGASTRODUODENOSCOPY (EGD);  Surgeon: [name], MD;  Location: [facility];  Service: Endoscopy;  Laterality: N/A;    Prior to Admission medications   Medication Sig Start Date End Date Taking? Authorizing Provider  albuterol (PROVENTIL HFA;VENTOLIN HFA) 108 (90 BASE) MCG/ACT inhaler Inhale 2 puffs into the lungs every 6 (six) hours as needed for wheezing. 11/23/12   Kathee Delton, MD  allopurinol (ZYLOPRIM) 300 MG tablet TAKE 1 TABLET BY MOUTH EVERY DAY 01/29/14   Biagio Borg, MD  amiodarone (PACERONE) 100 MG tablet Take 1 tablet (100 mg total) by mouth daily. 08/22/12   Biagio Borg, MD  aspirin EC 81 MG tablet Take 1 tablet (81 mg total) by mouth daily. 05/04/12   Cherene Altes, MD  EPINEPHrine (EPIPEN) 0.3 mg/0.3 mL SOAJ injection Inject 0.3 mLs (0.3 mg total) into the muscle once. 02/01/13   Darreld Mclean, MD  isosorbide mononitrate (IMDUR) 30 MG 24 hr tablet TAKE 1 TABLET BY MOUTH EVERY DAY    Biagio Borg, MD  Multiple Vitamin (MULTIVITAMIN) tablet Take 1 tablet by mouth at bedtime.     Historical Provider, MD  nitroGLYCERIN (NITROSTAT) 0.4 MG SL tablet Place 1 tablet  (0.4 mg total) under the tongue every 5 (five) minutes as needed for chest pain. 12/26/13 09/16/15  Biagio Borg, MD  pantoprazole (PROTONIX) 40 MG tablet TAKE 1 TABLET BY MOUTH EVERY DAY    Biagio Borg, MD  Rivaroxaban (XARELTO) 20 MG TABS tablet Take 1 tablet (20 mg total) by mouth daily with supper. 04/25/13   Evans Lance, MD  SYNTHROID 100 MCG tablet TAKE 1 TABLET BY MOUTH EVERY DAY 01/02/14   Biagio Borg, MD  tiotropium (SPIRIVA HANDIHALER) 18 MCG inhalation capsule Place 1 capsule (18 mcg total) into inhaler and inhale daily. 05/02/13   Kathee Delton, MD    Scheduled Meds: . allopurinol  300 mg Oral Daily  . amiodarone  100 mg Oral Daily  .  folic acid  1 mg Oral Daily  . levothyroxine  100 mcg Oral QAC breakfast  . multivitamin with minerals  1 tablet Oral Daily  . [START ON 02/16/2014] pantoprazole (PROTONIX) IV  40 mg Intravenous Q12H  . thiamine  100 mg Oral Daily   Or  . thiamine  100 mg Intravenous Daily  . tiotropium  18 mcg Inhalation Daily   Infusions: . sodium chloride 75 mL/hr at 02/12/14 2225  . pantoprozole (PROTONIX) infusion 8 mg/hr (02/12/14 2255)   PRN Meds: acetaminophen, acetaminophen, albuterol, LORazepam, LORazepam, nitroGLYCERIN, ondansetron (ZOFRAN) IV, ondansetron   Allergies as of 02/12/2014 - Review Complete 02/12/2014  Allergen Reaction Noted  . Bee venom Anaphylaxis 04/25/2013  . Other Nausea And Vomiting 04/26/2012  . Crestor [rosuvastatin]  09/11/2013  . Doxycycline hyclate Other (See Comments) 07/30/2005  . Lipitor [atorvastatin calcium] Other (See Comments) 05/27/2011  . Promethazine hcl Other (See Comments) 09/02/2011  . Tetracycline Other (See Comments) 04/25/2008  . Tetanus toxoid Rash 04/25/2008    Family History  Problem Relation Age of Onset  . Diabetes Father   . Diabetes Sister   . Multiple myeloma Mother   . Cancer Mother   . Other Mother     varicose veins  . Heart disease Paternal Uncle   . Heart disease Maternal Uncle    . Lymphoma Sister     half-sister    History   Social History  . Marital Status: Married    Spouse Name: Victorino Dike    Number of Children: N/A  . Years of Education: N/A   Occupational History  . self employeed - Contractor    Social History Main Topics  . Smoking status: Former Smoker -- 4.00 packs/day for 31 years    Types: Cigarettes    Quit date: 03/19/1983  . Smokeless tobacco: Never Used  . Alcohol Use: 6.0 oz/week    10 Shots of liquor per week  . Drug Use: No  . Sexual Activity: Yes   Other Topics Concern  . Not on file   Social History Narrative   Married lives with his wife    REVIEW OF SYSTEMS: Constitutional: per HPI  ENT:  No nose bleeds Pulm:  Per HPI CV:  No palpitations, + tachy with exertion,  no LE edema. No chest pain GU:  No hematuria, no frequency GI:  Per HPI Heme:  Per HPI   Transfusions:  2 units PRBCs in 2013 Neuro:  No headaches, no peripheral tingling or numbness Derm:  No itching, no rash or sores.  Endocrine:  No sweats or chills.  No polyuria or dysuria Immunization:  Not queried Travel:  None beyond local counties in last few months.    PHYSICAL EXAM: Vital signs in last 24 hours: Filed Vitals:   02/13/14 0739  BP: 108/68  Pulse: 50  Temp: 97.8 F (36.6 C)  Resp: 15   Wt Readings from Last 3 Encounters:  02/12/14 85.2 kg (187 lb 13.3 oz)  02/12/14 83.008 kg (183 lb)  12/26/13 83.575 kg (184 lb 4 oz)    General: pleasant, looks well and comfortable Head:  No swelling or asymmetry  Eyes:  No icterus or pallor Ears:  Not HOH  Nose:  No congestion or discharge Mouth:  Clear and moist Neck:  No JVD or TMG Lungs:  Clear bil.  No labored breathing Heart: RRR, no MRG Abdomen:  Soft, NT, ND, no mass.   Rectal: deferred   Musc/Skeltl: no joint pain or swelling Extremities:  No CCE  Neurologic:  Oriented x 3.  No tremor.  No limb weakness Skin:  No rash, sores, no telangectasia Tattoos:  none Nodes:  No cervical  adenoapthy   Psych:  Pleasant, relaxed, cooperative.   Intake/Output from previous day: 09/28 0701 - 09/29 0700 In: 858.3 [I.V.:858.3] Out: 400 [Urine:400] Intake/Output this shift:    LAB RESULTS:  Recent Labs  02/12/14 2045 02/13/14 0034 02/13/14 0421  WBC 4.8 4.4 4.3  HGB 8.7* 8.3* 8.2*  HCT 26.0* 24.7* 24.9*  PLT 192 187 184   BMET Lab Results  Component Value Date   NA 140 02/13/2014   NA 136* 02/12/2014   NA 136 02/12/2014   K 4.6 02/13/2014   K 4.9 02/12/2014   K 4.8 02/12/2014   CL 105 02/13/2014   CL 101 02/12/2014   CL 104 02/12/2014   CO2 25 02/13/2014   CO2 22 02/12/2014   CO2 25 02/12/2014   GLUCOSE 95 02/13/2014   GLUCOSE 102* 02/12/2014   GLUCOSE 108* 02/12/2014   BUN 34* 02/13/2014   BUN 38* 02/12/2014   BUN 37* 02/12/2014   CREATININE 1.60* 02/13/2014   CREATININE 1.57* 02/12/2014   CREATININE 1.59* 02/12/2014   CALCIUM 9.0 02/13/2014   CALCIUM 9.5 02/12/2014   CALCIUM 9.8 02/12/2014   LFT  Recent Labs  02/12/14 1639 02/12/14 2045 02/13/14 0421  PROT 6.6 6.8 6.2  ALBUMIN 4.0 3.5 3.2*  AST $Re'15 16 14  'ABX$ ALT $R'12 12 11  'fB$ ALKPHOS 70 78 70  BILITOT 0.5 0.4 0.4   PT/INR Lab Results  Component Value Date   INR 1.37 02/12/2014   INR 1.31 04/28/2012   INR 2.09* 04/27/2012   Hepatitis Panel No results found for this basename: HEPBSAG, HCVAB, HEPAIGM, HEPBIGM,  in the last 72 hours C-Diff No components found with this basename: cdiff   Lipase  No results found for this basename: lipase    Drugs of Abuse  No results found for this basename: labopia, cocainscrnur, labbenz, amphetmu, thcu, labbarb     RADIOLOGY STUDIES: No results found.      IMPRESSION:   *  Anemia, FOBT + Presume he is bleeding from an AVM, as he has in past.\On prevacid daily for hx esophageal stricture. Hx adenomatous colon polyps on 2012 colonoscopy.    *  Chronic xarelto for a fib.  Last dose was on 9/26.    PLAN:     *  EGD/Enteroscopy today.     Azucena Freed   02/13/2014, 8:33 AM Pager: [phone]

## 2014-02-13 NOTE — Consult Note (Signed)
Patient seen, examined, and I agree with the above documentation, including the assessment and plan. Patient admitted again with weakness and acute, present posthemorrhagic, anemia. Heme-positive stool. History of jejunal angiectasia bleeding treated in 2013 Plan small bowel enteroscopy today for investigation of bleeding and treatment if lesion found Hemodynamics are currently stable, hemoglobin 8.2 He has been off anticoagulants since Saturday, 3 days. He is adamant he does not want to resume Xarelto, but will defer this discussion regarding risks vs. Benefits to he and his cardiologist, Dr. Lovena Le The nature of the procedure, as well as the risks, benefits, and alternatives were carefully and thoroughly reviewed with the patient. Ample time for discussion and questions allowed. The patient understood, was satisfied, and agreed to proceed.

## 2014-02-13 NOTE — Progress Notes (Signed)
Utilization Review Completed.  

## 2014-02-13 NOTE — Progress Notes (Signed)
[facility]  Omar Benson [id number] DOB: Nov 21, 1934 DOA: 02/12/2014 PCP: [name], MD   Brief narrative: 79 year old male patient with known CAD and prior stents, atrial fibrillation on Xarelto, COPD and hypothyroidism. Endorse weakness for 2 days. He noticed that when utilizing his treadmill that his heart rate increased higher than usual baseline and he had noticed becoming more easily fatigued with basic activities. He presented to an urgent care and his hemoglobin was found to be around 9 which was a significant decrease from most recent hemoglobin in the past month. The patient also endorsed dark stools. He has chronic shortness of breath from his COPD which has not changed. He has a previous episode of GI bleeding in 2013 an EGD at that time revealed AV malformation.  Upon presentation to the emergency department fecal occult blood was found to be positive. He was hemodynamically stable with a bradycardic rate but she endorses is at baseline for him. His BUN was mildly elevated at 37 and his hemoglobin was 9.1. Hemoglobin in August of 2015 was 13.3.  Since admission he has been evaluated by gastroenterology. He has undergone EGD which revealed proximal jejunum AVM that was oozing. He was treated with ablation with complete cessation of bleeding noted. Gastroenterology recommended avoiding resuming anticoagulation for at least 48 hours. If bleeding persists then we need to repeat video capsule endoscopy to look for more small bowel AVMs.  HPI/Subjective: Alert and without any complaints. No apparent bleeding since admission.  Assessment/Plan: Active Problems:   Acute GI bleeding 2/2  Angiodysplasia of jejunum with bleeding Post EGD with ablation of AVM-if bleeding recurs will need capsule endoscopy-GI advancing diet-Xarelto on hold and should not resume for at least 48 hours per GI    Atrial fibrillation Currently rate controlled and maintaining  sinus rhythm-continue amiodarone    Acute blood loss anemia Hemoglobin has dropped a total of 5 g compared to baseline but has drifted slightly from 9.1 and admission to 8.2-given coronary disease would transfuse only if hemoglobin less than 8 and otherwise hemodynamically stable    HYPERTENSION Blood pressure soft-Imdur are on hold    CAD As above    Subacute Hypothyroid TSH > 8-free T4 and T3 pending-TSH one month ago was 8.04 -Increase Synthroid 225 mcg daily    COPD Compensated without wheezing   Chronic kidney disease stage III Baseline creatinine 1.6-BUN elevated in setting of acute GI bleeding    DVT prophylaxis: SCDs Code Status: Full Family Communication: No family at bedside Disposition Plan/Expected LOS: Stepdown   Consultants: Gastroenterology  Procedures: EGD: 1. jejunal angiodysplastic lesion, successfully treated with APC  2. unremarkable esophagus, stomach and duodenal mucosa  3. small hiatal hernia   Cultures: None  Antibiotics: None  Objective: Blood pressure 102/53, pulse 48, temperature 98.4 F (36.9 C), temperature source Oral, resp. rate 22, height 5\' 9"  (1.753 m), weight 187 lb 13.3 oz (85.2 kg), SpO2 100.00%.  Intake/Output Summary (Last 24 hours) at 02/13/14 1312 Last data filed at 02/13/14 1300  Gross per 24 hour  Intake 1458.33 ml  Output   1400 ml  Net  58.33 ml     Exam: Gen: No acute respiratory distress Chest: Clear to auscultation bilaterally without wheezes, rhonchi or crackles, room air Cardiac: Regular bradycardia rate and rhythm, S1-S2, no rubs murmurs or gallops, no peripheral edema, no JVD Abdomen: Soft nontender nondistended without obvious hepatosplenomegaly, no ascites Extremities: Symmetrical in appearance without cyanosis, clubbing  or effusion  Scheduled Meds:  Scheduled Meds: . allopurinol  300 mg Oral Daily  . amiodarone  100 mg Oral Daily  . folic acid  1 mg Oral Daily  . levothyroxine  100 mcg Oral QAC  breakfast  . multivitamin with minerals  1 tablet Oral Daily  . [START ON 02/16/2014] pantoprazole (PROTONIX) IV  40 mg Intravenous Q12H  . thiamine  100 mg Oral Daily   Or  . thiamine  100 mg Intravenous Daily  . tiotropium  18 mcg Inhalation Daily   Continuous Infusions: . sodium chloride 75 mL/hr at 02/13/14 1126  . sodium chloride    . pantoprozole (PROTONIX) infusion 8 mg/hr (02/13/14 1126)    Data Reviewed: Basic Metabolic Panel:  Recent Labs Lab 02/12/14 1639 02/12/14 2045 02/13/14 0421  NA 136 136* 140  K 4.8 4.9 4.6  CL 104 101 105  CO2 25 22 25   GLUCOSE 108* 102* 95  BUN 37* 38* 34*  CREATININE 1.59* 1.57* 1.60*  CALCIUM 9.8 9.5 9.0   Liver Function Tests:  Recent Labs Lab 02/12/14 1639 02/12/14 2045 02/13/14 0421  AST 15 16 14   ALT 12 12 11   ALKPHOS 70 78 70  BILITOT 0.5 0.4 0.4  PROT 6.6 6.8 6.2  ALBUMIN 4.0 3.5 3.2*   No results found for this basename: LIPASE, AMYLASE,  in the last 168 hours No results found for this basename: AMMONIA,  in the last 168 hours CBC:  Recent Labs Lab 02/12/14 1652 02/12/14 2045 02/13/14 0034 02/13/14 0421  WBC 5.6 4.8 4.4 4.3  NEUTROABS  --  3.1  --   --   HGB 9.1* 8.7* 8.3* 8.2*  HCT 29.0* 26.0* 24.7* 24.9*  MCV 99.1* 92.5 92.9 92.6  PLT  --  192 187 184   Cardiac Enzymes:  Recent Labs Lab 02/12/14 2045  TROPONINI <0.30   BNP (last 3 results) No results found for this basename: PROBNP,  in the last 8760 hours CBG:  Recent Labs Lab 02/13/14 0615 02/13/14 1206  GLUCAP 94 112*    Recent Results (from the past 240 hour(s))  MRSA PCR SCREENING     Status: None   Collection Time    02/12/14  8:07 PM      Result Value Ref Range Status   MRSA by PCR NEGATIVE  NEGATIVE Final   Comment:            The GeneXpert MRSA Assay (FDA     approved for NASAL specimens     only), is one component of a     comprehensive MRSA colonization     surveillance program. It is not     intended to diagnose MRSA       infection nor to guide or     monitor treatment for     MRSA infections.     Studies:  Recent x-ray studies have been reviewed in detail by the Attending Physician  Time spent :      Erin Hearing, [place] [facility] Office  [phone] Pager [phone]   **If unable to reach the above provider after paging please contact the [place] @ [phone]  On-Call/Text Page:      [email or web address]      password [email or web address]  If 7PM-7AM, please contact night-coverage [email or web address] Password [email or web address] 02/13/2014, 1:12 PM   LOS: 1 day   Examined patient and discussed assessment and plan and he Ebony Hail and agree with above. Patient with multiple complex medical problems> 40 mins  spent in direct patient care

## 2014-02-14 ENCOUNTER — Encounter (HOSPITAL_COMMUNITY): Payer: Self-pay | Admitting: Internal Medicine

## 2014-02-14 ENCOUNTER — Encounter (HOSPITAL_COMMUNITY)
Admission: AD | Disposition: A | Discharge status: Home / Self Care | Payer: Self-pay | Source: Ambulatory Visit | Attending: Internal Medicine

## 2014-02-14 ENCOUNTER — Other Ambulatory Visit: Payer: Self-pay

## 2014-02-14 LAB — GLUCOSE, CAPILLARY
GLUCOSE-CAPILLARY: 102 mg/dL — AB (ref 70–99)
Glucose-Capillary: 106 mg/dL — ABNORMAL HIGH (ref 70–99)
Glucose-Capillary: 108 mg/dL — ABNORMAL HIGH (ref 70–99)

## 2014-02-14 LAB — BASIC METABOLIC PANEL
Anion gap: 9 (ref 5–15)
BUN: 26 mg/dL — AB (ref 6–23)
CALCIUM: 8.6 mg/dL (ref 8.4–10.5)
CO2: 25 meq/L (ref 19–32)
CREATININE: 1.74 mg/dL — AB (ref 0.50–1.35)
Chloride: 107 mEq/L (ref 96–112)
GFR calc Af Amer: 42 mL/min — ABNORMAL LOW (ref >90)
GFR calc non Af Amer: 36 mL/min — ABNORMAL LOW (ref >90)
GLUCOSE: 96 mg/dL (ref 70–99)
Potassium: 4.8 mEq/L (ref 3.7–5.3)
Sodium: 141 mEq/L (ref 137–147)

## 2014-02-14 LAB — T3: T3, Total: 57 ng/dl — ABNORMAL LOW (ref 80.0–204.0)

## 2014-02-14 SURGERY — EGD (ESOPHAGOGASTRODUODENOSCOPY)
Anesthesia: Moderate Sedation

## 2014-02-14 MED ORDER — PANTOPRAZOLE SODIUM 40 MG PO TBEC
40.0000 mg | DELAYED_RELEASE_TABLET | Freq: Every day | ORAL | Status: DC
  Administered 2014-02-14: 40 mg via ORAL
  Filled 2014-02-14: qty 1

## 2014-02-14 MED ORDER — LEVOTHYROXINE SODIUM 100 MCG PO TABS: 100.0000 ug | ORAL_TABLET | Freq: Every day | ORAL | Status: DC

## 2014-02-14 MED ORDER — ACETAMINOPHEN 325 MG PO TABS: 650.0000 mg | ORAL_TABLET | Freq: Four times a day (QID) | ORAL | Status: DC | PRN

## 2014-02-14 MED ORDER — SYNTHROID 100 MCG PO TABS: ORAL_TABLET | ORAL | Status: DC

## 2014-02-14 MED ORDER — ASPIRIN EC 81 MG PO TBEC: 81.0000 mg | DELAYED_RELEASE_TABLET | Freq: Every day | ORAL | Status: DC

## 2014-02-14 MED ORDER — RIVAROXABAN 20 MG PO TABS: 20.0000 mg | ORAL_TABLET | Freq: Every day | ORAL | Status: DC

## 2014-02-14 NOTE — Progress Notes (Signed)
Patient seen, examined, and I agree with the above documentation, including the assessment and plan. ? Of iron will be based on iron studies, okay to resume for now.  Plan repeat CBC with iron studies in 2 weeks (as outpatient at our office).  May need stool softener with iron. Will likely resume Xarelto tomorrow with reluctance Tolerating full diet Will sign off, and schedule outpatient follow-up

## 2014-02-14 NOTE — Progress Notes (Signed)
Daily Rounding Note  02/14/2014, 8:15 AM  LOS: 2 days   SUBJECTIVE:       No BMs since before EGD.  Norm is BM every 3 to 4 days.  Feels well.  Wondering if he cn go home.  Says he does not want to take Xarelto anymore.   Aware that he is increasing his risk for stroke.   OBJECTIVE:         Vital signs in last 24 hours:    Temp:  [97.5 F (36.4 C)-98.4 F (36.9 C)] 97.5 F (36.4 C) (09/30 0700) Pulse Rate:  [45-88] 49 (09/30 0700) Resp:  [11-32] 16 (09/30 0700) BP: (70-168)/(26-85) 117/54 mmHg (09/30 0700) SpO2:  [94 %-100 %] 96 % (09/30 0700) Weight:  [85.7 kg (188 lb 15 oz)] 85.7 kg (188 lb 15 oz) (09/30 0600) Last BM Date: 02/12/14 Filed Weights   02/12/14 2000 02/14/14 0600  Weight: 85.2 kg (187 lb 13.3 oz) 85.7 kg (188 lb 15 oz)   General: looks well   Heart: RRR, NSR in 50s on monitor Chest: clear bil.  No resp distress Abdomen: soft, +BS.  Not distended or tender  Extremities: no CCE Neuro/Psych:  Pleasant, relaxed, in good spirits  Intake/Output from previous day: 09/29 0701 - 09/30 0700 In: 2465 [P.O.:700; I.V.:1765] Out: 1800 [Urine:1800]  Intake/Output this shift:    Lab Results:  Recent Labs  02/13/14 0034 02/13/14 0421 02/13/14 1842  WBC 4.4 4.3 5.4  HGB 8.3* 8.2* 8.9*  HCT 24.7* 24.9* 26.8*  PLT 187 184 200   BMET  Recent Labs  02/12/14 2045 02/13/14 0421 02/14/14 0332  NA 136* 140 141  K 4.9 4.6 4.8  CL 101 105 107  CO2 22 25 25   GLUCOSE 102* 95 96  BUN 38* 34* 26*  CREATININE 1.57* 1.60* 1.74*  CALCIUM 9.5 9.0 8.6   LFT  Recent Labs  02/12/14 1639 02/12/14 2045 02/13/14 0421  PROT 6.6 6.8 6.2  ALBUMIN 4.0 3.5 3.2*  AST 15 16 14   ALT 12 12 11   ALKPHOS 70 78 70  BILITOT 0.5 0.4 0.4   PT/INR  Recent Labs  02/12/14 2045  LABPROT 16.9*  INR 1.37   Scheduled Meds: . allopurinol  300 mg Oral Daily  . amiodarone  100 mg Oral Daily  . folic acid  1 mg Oral  Daily  . levothyroxine  125 mcg Oral QAC breakfast  . multivitamin with minerals  1 tablet Oral Daily  . [START ON 02/16/2014] pantoprazole (PROTONIX) IV  40 mg Intravenous Q12H  . thiamine  100 mg Oral Daily   Or  . thiamine  100 mg Intravenous Daily  . tiotropium  18 mcg Inhalation Daily   Continuous Infusions: . sodium chloride 20 mL/hr at 02/13/14 2346  . pantoprozole (PROTONIX) infusion 8 mg/hr (02/14/14 0722)   PRN Meds:.acetaminophen, acetaminophen, albuterol, LORazepam, LORazepam, menthol-cetylpyridinium, nitroGLYCERIN, ondansetron (ZOFRAN) IV, ondansetron   ASSESMENT:   * Anemia, FOBT +.  No transfusions to date.  EGD 9/29: AVM in prox jejunum.  Ablated with APC. Hx adenomatous colon polyps on 2012 colonoscopy.  Hgb stable, BUN improved.  * Chronic xarelto for a fib. Last dose was on 9/26.    PLAN   *  Switch to oral PPI, once daily. CBC in AM.  Start back on Xarelto 10/1 if he decides he will resume this. Discussed that he could still lose blood from AVMs that he may harbor  deep in unscopable portions of SB even if not on xarelto.  *  Wonders if he should go back on once daily po Iron, took this in past and Hgb improved to 12-13 so he stopped it.  Will d/w Dr Hilarie Fredrickson.     Azucena Freed  02/14/2014, 8:15 AM Pager: [phone]

## 2014-02-14 NOTE — Discharge Summary (Addendum)
Physician Discharge Summary  Omar Benson [id number] DOB: December 21, 1934 DOA: 02/12/2014  PCP: [name], MD  Admit date: 02/12/2014 Discharge date: 02/14/2014  Time spent: >30 minutes  Recommendations for Outpatient Follow-up:  1. Follow up with PCP- call to arrange appointment ASAP 2. Follow up with Dr. Hilarie Fredrickson as scheduled 3. Please schedule appointment with Dr. Lovena Le (Cardiology) to discuss continued use of Xarelto and risk vs benefits in setting of atrial fib 4. GI has approved resumption of anticoagulation on 10/1 5. Recommend CBC in 1-2 weeks to follow hgb - as well as a repeat BMET to assure renal fxn stable as crt mildly elevated at time of d/c (though close to baseline)  Discharge Diagnoses:    Acute GI bleeding due to Angiodysplasia of jejunum   Atrial fibrillation - maintaining NSR   Acute blood loss anemia   HYPERTENSION   CAD   Hypothyroidism on Synthroid  Discharge Condition: stable  Diet recommendation: Heart Healthy  Filed Weights   02/12/14 2000 02/14/14 0600  Weight: 187 lb 13.3 oz (85.2 kg) 188 lb 15 oz (85.7 kg)   History of present illness:  79 year old male patient with known CAD and prior stents, atrial fibrillation on Xarelto, COPD and hypothyroidism who endorsed weakness for 2 days. He noticed that when utilizing his treadmill his heart rate increased higher than usual and he noticed becoming more easily fatigued with basic activities. He presented to an urgent care and his hemoglobin was found to be around 9 which was a significant decrease from most recent hemoglobin the prior month. The patient also endorsed dark stools. He has chronic shortness of breath from his COPD which had not changed. He had a previous episode of GI bleeding in 2013 - an EGD at that time revealed AV malformations.   Upon presentation to the emergency department fecal occult blood was found to be positive. He was hemodynamically stable with a bradycardic rate but he endorsed  this was baseline for him. His BUN was mildly elevated at 37 and his hemoglobin was 9.1. Hemoglobin in August of 2015 was 13.3.   Hospital Course:  Since admission he has been evaluated by Gastroenterology. He has undergone EGD which revealed a proximal jejunum AVM that were oozing. He was treated with ablation with complete cessation of bleeding noted. Gastroenterology recommended avoiding resuming anticoagulation for at least 48 hours. If bleeding persisted then plan was to repeat video capsule endoscopy to look for more small bowel AVMs.  Acute GI bleeding 2/2 Angiodysplasia of jejunum with bleeding  Post EGD with ablation of AVM - no further bleeding so GI approved pt to discharge - Xarelto on hold until 10/1  Atrial fibrillation  Currently rate controlled and maintaining sinus rhythm - continue amiodarone - follow up with Dr. Lovena Le - patient reluctant to continue Xarelto - needs to schedule OP appointment to discuss risk vs benefits of this medicine and other forms of anticoagulation (such as Warfarin) with primary risk factor of stroke   Acute blood loss anemia  Hemoglobin had dropped a total of 5 g compared to baseline - nadir of 8.2 - Hgb 8.9 on date of discharge - transfusion was not required   HYPERTENSION  Blood pressure soft - will continue to hold Imdur at discharge   CAD  No acute complicating factors    Hypothyroid  TSH > 8 - free T4 normal at 1.24 -TSH one month ago was 8.04 - continue preadmission dose of Synthroid and recommend repeat labs  in 6-8 weeks  COPD  Compensated without wheezing   Chronic kidney disease stage III  Baseline creatinine 1.6 - BUN elevated in setting of acute GI bleeding with trend down upon dc date  Procedures: EGD: 1. jejunal angiodysplastic lesion, successfully treated with APC  2. unremarkable esophagus, stomach and duodenal mucosa  3. small hiatal hernia  Consultations: Gastroenterology  Discharge Exam: Filed Vitals:   02/14/14 1206   BP: 100/69  Pulse: 51  Temp: 97.9 F (36.6 C)  Resp: 21   Gen: No acute respiratory distress  Chest: Clear to auscultation bilaterally without wheezes, rhonchi or crackles, room air  Cardiac: Regular bradycardia rate and rhythm, S1-S2, no rubs murmurs or gallops, no peripheral edema Abdomen: Soft nontender nondistended without obvious hepatosplenomegaly, no ascites  Extremities: Symmetrical in appearance without cyanosis, clubbing or effusion  Discharge Instructions   Call MD for:  difficulty breathing, headache or visual disturbances    Complete by:  As directed      Call MD for:  extreme fatigue    Complete by:  As directed      Call MD for:  persistant dizziness or light-headedness    Complete by:  As directed      Call MD for:    Complete by:  As directed   If bleeding recurs     Diet - low sodium heart healthy    Complete by:  As directed      Increase activity slowly    Complete by:  As directed           Current Discharge Medication List    START taking these medications   Details  acetaminophen (TYLENOL) 325 MG tablet Take 2 tablets (650 mg total) by mouth every 6 (six) hours as needed for mild pain (or Fever >/= 101).    rivaroxaban (XARELTO) 20 MG TABS tablet Take 1 tablet (20 mg total) by mouth daily with supper. Please resume as directed- you need to discuss with your cardiologist risks and benefits of stopping this medication in setting of atrial fibrillation ie risk of stroke Qty: 30 tablet      CONTINUE these medications which have CHANGED   Details  aspirin EC 81 MG tablet Take 1 tablet (81 mg total) by mouth daily.    SYNTHROID 100 MCG tablet TAKE 1 TABLET BY MOUTH EVERY DAY Qty: 90 tablet, Refills: 3      CONTINUE these medications which have NOT CHANGED   Details  allopurinol (ZYLOPRIM) 300 MG tablet Take 300 mg by mouth daily.    amiodarone (PACERONE) 100 MG tablet Take 1 tablet (100 mg total) by mouth daily. Qty: 90 tablet, Refills: 3     calcium elemental as carbonate (TUMS ULTRA 1000) 400 MG tablet Chew 1,000 mg by mouth daily as needed for heartburn.    hydrocortisone cream 0.5 % Apply 1 application topically daily.     Multiple Vitamin (MULTIVITAMIN) tablet Take 1 tablet by mouth at bedtime.     pantoprazole (PROTONIX) 40 MG tablet Take 40 mg by mouth daily.    tiotropium (SPIRIVA HANDIHALER) 18 MCG inhalation capsule Place 1 capsule (18 mcg total) into inhaler and inhale daily. Qty: 90 capsule, Refills: 3    albuterol (PROVENTIL HFA;VENTOLIN HFA) 108 (90 BASE) MCG/ACT inhaler Inhale 2 puffs into the lungs every 6 (six) hours as needed for wheezing. Qty: 1 Inhaler, Refills: 6    EPINEPHrine (EPIPEN) 0.3 mg/0.3 mL SOAJ injection Inject 0.3 mLs (0.3 mg total) into the muscle  once. Qty: 2 Device, Refills: 2   Associated Diagnoses: Bee sting, initial encounter    nitroGLYCERIN (NITROSTAT) 0.4 MG SL tablet Place 1 tablet (0.4 mg total) under the tongue every 5 (five) minutes as needed for chest pain. Qty: 25 tablet, Refills: 5      STOP taking these medications     isosorbide mononitrate (IMDUR) 30 MG 24 hr tablet        Allergies  Allergen Reactions  . Bee Venom Anaphylaxis    Only yellow jackets   . Promethazine Hcl Other (See Comments)    syncope  . Doxycycline Hyclate Other (See Comments)    esophagitis  . Lipitor [Atorvastatin Calcium] Other (See Comments)    Myalgia/myopathy  . Other Diarrhea and Nausea And Vomiting    Seafood  . Tetracycline Other (See Comments)    Esophagitis  . Crestor [Rosuvastatin] Other (See Comments)    Myalgia/myopathy  . Tetanus Toxoid Rash   Follow-up Information   Schedule an appointment as soon as possible for a visit with [name], MD.   Specialties:  Internal Medicine, Radiology   Contact information:   [address] [place] [address] [phone]       Schedule an appointment as soon as possible for a visit with Cristopher Peru, MD. (to discuss  Xarelto)    Specialty:  Cardiology   Contact information:   [address] [place] [address] [phone]       Follow up with [name], MD. (If office does not conatct you in the next 48 hours with an appointment please call them to arrange appointment)    Specialty:  Gastroenterology   Contact information:   [address] [place] [address] [phone]      Microbiology: Recent Results (from the past 240 hour(s))  MRSA PCR SCREENING     Status: None   Collection Time    02/12/14  8:07 PM      Result Value Ref Range Status   MRSA by PCR NEGATIVE  NEGATIVE Final   Comment:            The GeneXpert MRSA Assay (FDA     approved for NASAL specimens     only), is one component of a     comprehensive MRSA colonization     surveillance program. It is not     intended to diagnose MRSA     infection nor to guide or     monitor treatment for     MRSA infections.    Labs: Basic Metabolic Panel:  Recent Labs Lab 02/12/14 1639 02/12/14 2045 02/13/14 0421 02/14/14 0332  NA 136 136* 140 141  K 4.8 4.9 4.6 4.8  CL 104 101 105 107  CO2 25 22 25 25   GLUCOSE 108* 102* 95 96  BUN 37* 38* 34* 26*  CREATININE 1.59* 1.57* 1.60* 1.74*  CALCIUM 9.8 9.5 9.0 8.6   Liver Function Tests:  Recent Labs Lab 02/12/14 1639 02/12/14 2045 02/13/14 0421  AST 15 16 14   ALT 12 12 11   ALKPHOS 70 78 70  BILITOT 0.5 0.4 0.4  PROT 6.6 6.8 6.2  ALBUMIN 4.0 3.5 3.2*   CBC:  Recent Labs Lab 02/12/14 1652 02/12/14 2045 02/13/14 0034 02/13/14 0421 02/13/14 1842  WBC 5.6 4.8 4.4 4.3 5.4  NEUTROABS  --  3.1  --   --   --   HGB 9.1* 8.7* 8.3* 8.2* 8.9*  HCT  29.0* 26.0* 24.7* 24.9* 26.8*  MCV 99.1* 92.5 92.9 92.6 96.4  PLT  --  192 187 184 200   Cardiac Enzymes:  Recent Labs Lab 02/12/14 2045  TROPONINI <0.30   CBG:  Recent Labs Lab 02/13/14 1206 02/13/14 1711 02/14/14 0049 02/14/14 0621 02/14/14 1208  GLUCAP 112* 148* 102* 106* 108*    Signed:  Erin Hearing ANP [facility] 02/14/2014, 2:21 PM  I have personally examined this patient and reviewed the entire database. I have reviewed the above note, made any necessary editorial changes, and agree with its content.  Cherene Altes, MD [facility]

## 2014-02-15 ENCOUNTER — Other Ambulatory Visit: Payer: Self-pay

## 2014-02-15 ENCOUNTER — Telehealth: Payer: Self-pay

## 2014-02-15 ENCOUNTER — Telehealth: Payer: Self-pay | Admitting: *Deleted

## 2014-02-15 DIAGNOSIS — D6489 Other specified anemias: Secondary | ICD-10-CM

## 2014-02-15 MED ORDER — INTEGRA 62.5-62.5-40-3 MG PO CAPS: 1.0000 | ORAL_CAPSULE | Freq: Every day | ORAL | Status: DC

## 2014-02-15 NOTE — Telephone Encounter (Signed)
Transition Care Management Follow-up Telephone Call D/C ON 02/14/14  How have you been since you were released from the hospital? Pt states he is just tired   Do you understand why you were in the hospital? YES, he stated he understood why he was admitted   Do you understand the discharge instrcutions? YES, he understood d/c summary we went over again  Items Reviewed:  Medications reviewed: YES, reviewed but pt stated they didn't tell him to stop taking the isosorbide. Advise pt to hold until he see md for hospital f/u  Allergies reviewed: YES, reviewed  Dietary changes reviewed: YES, heart healthy he also stated not able to eat much  Referrals reviewed: No referrals recommended   Functional Questionnaire:   Activities of Daily Living (ADLs):   He states he is independent in the following: bathing and hygiene, clothing himself, walking, ect States he doesn't need assistance    Any transportation issues/concerns?: NO  Any patient concerns? NO  Confirmed importance and date/time of follow-up visits scheduled: YES, made TCM appt for 02/20/14 with Dr. Jenny Reichmann. Advise pt to make sure to keep all appts with md's   Confirmed with patient if condition begins to worsen call PCP or go to the ER.  Patient was given the Call-a-Nurse line [phone]: YES

## 2014-02-15 NOTE — Telephone Encounter (Signed)
Message copied by Algernon Huxley on Thu Feb 15, 2014 11:08 AM ------      Message from: [name]      Created: Wed Feb 14, 2014 11:24 AM       Pt needs CBC and iron studies in 2 weeks      Also time to schedule pre-visit for colonoscopy, was Sharlett Iles pt before (said he got a letter).  Wants me to do it.      Integra 1 capsule daily       Thanks       ------

## 2014-02-15 NOTE — Telephone Encounter (Signed)
Spoke with pt and he will come 02/28/14 for labs, orders in epic. Script sent to pharmacy for Integra and pt aware. Pt states he does not want to schedule previsit or colon until March, pt instructed to call back to schedule these as schedule is not out for March yet. Pt verbalized understanding.

## 2014-02-20 ENCOUNTER — Ambulatory Visit (INDEPENDENT_AMBULATORY_CARE_PROVIDER_SITE_OTHER): Payer: Commercial Managed Care - HMO | Admitting: Internal Medicine

## 2014-02-20 ENCOUNTER — Encounter: Payer: Self-pay | Admitting: Internal Medicine

## 2014-02-20 ENCOUNTER — Other Ambulatory Visit (INDEPENDENT_AMBULATORY_CARE_PROVIDER_SITE_OTHER): Payer: Commercial Managed Care - HMO

## 2014-02-20 VITALS — BP 122/64 | HR 70 | Temp 98.1°F | Wt 185.2 lb

## 2014-02-20 DIAGNOSIS — I10 Essential (primary) hypertension: Secondary | ICD-10-CM

## 2014-02-20 DIAGNOSIS — D509 Iron deficiency anemia, unspecified: Secondary | ICD-10-CM

## 2014-02-20 DIAGNOSIS — E038 Other specified hypothyroidism: Secondary | ICD-10-CM

## 2014-02-20 LAB — BASIC METABOLIC PANEL
BUN: 24 mg/dL — ABNORMAL HIGH (ref 6–23)
CO2: 27 mEq/L (ref 19–32)
Calcium: 9.2 mg/dL (ref 8.4–10.5)
Chloride: 105 mEq/L (ref 96–112)
Creatinine, Ser: 1.8 mg/dL — ABNORMAL HIGH (ref 0.4–1.5)
GFR: 38.54 mL/min — ABNORMAL LOW (ref >60.00)
Glucose, Bld: 124 mg/dL — ABNORMAL HIGH (ref 70–99)
POTASSIUM: 4.4 meq/L (ref 3.5–5.1)
SODIUM: 136 meq/L (ref 135–145)

## 2014-02-20 NOTE — Assessment & Plan Note (Addendum)
BP soft at d/c, now improved, ok to cont the Imdur full dose and all other current meds BP Readings from Last 3 Encounters:  02/20/14 122/64  02/14/14 100/69  02/14/14 100/69   Also for bmet for f/u renal fxn

## 2014-02-20 NOTE — Assessment & Plan Note (Signed)
Ok to continue thyroid replacement at the 125 mcg - for f/u thyroid labs next visit

## 2014-02-20 NOTE — Assessment & Plan Note (Signed)
Overall stable back on xarelto; still with fatigue which should improved, but I suggested would take at least 2-3 months, depending on his Bone marrow response.  Cont iron, for f/u lab today

## 2014-02-20 NOTE — Progress Notes (Signed)
Subjective:    Patient ID: Omar Benson, male    DOB: 04/03/1935, 79 y.o.   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  Did not require transfuion per pt this time.  Second episode AVM bleeding. Last hgb 8.9 sept 28, was rec'd for f/u cbc and bmet at 1-2 wks,  Has cbc ordered per GI on EMR, but not bmet for f/u renal fxn.  Pt states he was under impression the labs were for oct 14, but wants to do today.  Overall doing ok since d/c, though with signficant fatiguability much more than usual, wondering how long his hgb will take to go back to 13, his previous baseline.  Is taking the iron po.  No other overt bleeding or bruising. Pt denies chest pain, increased sob or doe, wheezing, orthopnea, PND, increased LE swelling, palpitations, dizziness or syncope.  Denies worsening reflux, abd pain, dysphagia, n/v, bowel change or blood. Pt notes his usual HR response to treadmill for 35 min is to rise from low 50's to 74, but recently with anemia went to 86, and occas 108.  Does c/o ongoing fatigue, but denies signficant daytime hypersomnolence.  Thyroid med changed swith hospn - from 100 to 125 mcg with elev TSH.Marland Kitchen  Denies hyper or hypo thyroid symptoms such as voice, skin or hair change. Rec'd for f/u thyroid labs at 6-8 wks.  Pt has f/u with Dr Francis Gaines as well, Imdur rec'd to be held at d/c sept 29, but Pt was not sure if he should really stop all the imdur so has been taking half pill.  Has appt with DrTaylor soon this month with AAA u/s f/u.  Ongoing xarelto use to be considered at that time in light of recent GI bleeding, recurrent AVM x 2 Past Medical History  Diagnosis Date  . Hypothyroidism 12/05/2008  . HYPERLIPIDEMIA 09/19/2008  . GOUT 09/19/2008  . ANEMIA-IRON DEFICIENCY 09/19/2008  . HYPERTENSION 09/19/2008  . CAD 12/05/2008    s/p CABG; NSTEMI 05/2011 - LHC 05/25/11: LAD occluded, LIMA-LAD patent, ostial circumflex 50%, AV circumflex 70%, SVG-OM2 with  extensive disease with thrombus (culprit vessel), RCA 80% and occluded, SVG-intermediate patent, SVG-PDA/PL A patent.  Given that the culprit vessel was a subtotally occluded heavily thrombotic graft to a smaller OM2, medical therapy was recommended.;   . Atrial fibrillation 04/19/2008    amiodarone rx;  Echocardiogram 05/26/11: No wall motion abnormalities, mild LVH, EF 60%.  . Atrial flutter 12/05/2008    s/p RFCA  . BRADYCARDIA 12/05/2008  . AAA 09/19/2008  . PERIPHERAL VASCULAR DISEASE 09/19/2008  . GERD 09/19/2008  . DIVERTICULOSIS, COLON 09/19/2008  . RENAL INSUFFICIENCY 09/19/2008  . LOW BACK PAIN 09/19/2008  . PSA, INCREASED 09/19/2008  . COLONIC POLYPS, HX OF 09/19/2008    adenomatous polyps 07/2010  . GASTROINTESTINAL HEMORRHAGE, HX OF 04/19/2008  . NEPHROLITHIASIS, HX OF 01/11/2009  . CORONARY ARTERY BYPASS GRAFT, HX OF 12/05/2008    A. LIMA-LAD, VG-RI, VG-OM2, VG-RPD/RPL;  B. 05/2011 - NSTEMI - CATH WITH 4/5 PATENT GRAFTS AND NEW THROMBUS IN DISTAL VG-OM2 - MED RX  . ANXIETY 06/13/2010  . Arthritis   . COPD (chronic obstructive pulmonary disease)   . Myocardial infarction   . Allergy   . Benign esophageal stricture 09/2075    dilated during EGD  . Erectile dysfunction 09/13/2012  . CKD (chronic kidney disease) 09/19/2008    Qualifier: Diagnosis of  By: Jenny Reichmann  MD, Len Blalock    Past Surgical History  Procedure Laterality Date  . Aorto-femoral bypass x5 - stress test neg 9/08    . Coronary artery bypass graft    . S/p afib ablation  2004  . Cardiac catheterization  05/25/2011  . Colon surgery    . Esophagogastroduodenoscopy  04/28/2012    Procedure: ESOPHAGOGASTRODUODENOSCOPY (EGD);  Surgeon: Louis Meckel, MD;  Location: [facility] ENDOSCOPY;  Service: Endoscopy;  Laterality: N/A;  . Enteroscopy N/A 02/13/2014    Procedure: ENTEROSCOPY;  Surgeon: Beverley Fiedler, MD;  Location: [facility] ENDOSCOPY;  Service: Gastroenterology;  Laterality: N/A;  super slim    reports that he quit smoking about 30 years ago. His smoking  use included Cigarettes. He has a 124 pack-year smoking history. He has never used smokeless tobacco. He reports that he drinks about 6 ounces of alcohol per week. He reports that he does not use illicit drugs. family history includes Cancer in his mother; Diabetes in his father and sister; Heart disease in his maternal uncle and paternal uncle; Lymphoma in his sister; Multiple myeloma in his mother; Other in his mother. Allergies  Allergen Reactions  . Bee Venom Anaphylaxis    Only yellow jackets   . Promethazine Hcl Other (See Comments)    syncope  . Doxycycline Hyclate Other (See Comments)    esophagitis  . Lipitor [Atorvastatin Calcium] Other (See Comments)    Myalgia/myopathy  . Other Diarrhea and Nausea And Vomiting    Seafood  . Tetracycline Other (See Comments)    Esophagitis  . Crestor [Rosuvastatin] Other (See Comments)    Myalgia/myopathy  . Tetanus Toxoid Rash   Current Outpatient Prescriptions on File Prior to Visit  Medication Sig Dispense Refill  . acetaminophen (TYLENOL) 325 MG tablet Take 2 tablets (650 mg total) by mouth every 6 (six) hours as needed for mild pain (or Fever >/= 101).      Marland Kitchen albuterol (PROVENTIL HFA;VENTOLIN HFA) 108 (90 BASE) MCG/ACT inhaler Inhale 2 puffs into the lungs every 6 (six) hours as needed for wheezing.  1 Inhaler  6  . allopurinol (ZYLOPRIM) 300 MG tablet Take 300 mg by mouth daily.      Marland Kitchen amiodarone (PACERONE) 100 MG tablet Take 1 tablet (100 mg total) by mouth daily.  90 tablet  3  . aspirin EC 81 MG tablet Take 1 tablet (81 mg total) by mouth daily.      . calcium elemental as carbonate (TUMS ULTRA 1000) 400 MG tablet Chew 1,000 mg by mouth daily as needed for heartburn.      Marland Kitchen EPINEPHrine (EPIPEN) 0.3 mg/0.3 mL SOAJ injection Inject 0.3 mLs (0.3 mg total) into the muscle once.  2 Device  2  . Fe Fum-FePoly-Vit C-Vit B3 (INTEGRA) 62.5-62.5-40-3 MG CAPS Take 1 capsule by mouth daily.  30 capsule  3  . hydrocortisone cream 0.5 % Apply 1  application topically daily.       . Multiple Vitamin (MULTIVITAMIN) tablet Take 1 tablet by mouth at bedtime.       . nitroGLYCERIN (NITROSTAT) 0.4 MG SL tablet Place 1 tablet (0.4 mg total) under the tongue every 5 (five) minutes as needed for chest pain.  25 tablet  5  . pantoprazole (PROTONIX) 40 MG tablet Take 40 mg by mouth daily.      . rivaroxaban (XARELTO) 20 MG TABS tablet Take 1 tablet (20 mg total) by mouth daily with supper. Please resume as directed- you need to discuss with  your cardiologist risks and benefits of stopping this medication in setting of atrial fibrillation ie risk of stroke  30 tablet    . SYNTHROID 100 MCG tablet TAKE 1 TABLET BY MOUTH EVERY DAY  90 tablet  3  . tiotropium (SPIRIVA HANDIHALER) 18 MCG inhalation capsule Place 1 capsule (18 mcg total) into inhaler and inhale daily.  90 capsule  3   No current facility-administered medications on file prior to visit.     Review of Systems  Constitutional: Negative for unusual diaphoresis or other sweats  HENT: Negative for ringing in ear Eyes: Negative for double vision or worsening visual disturbance.  Respiratory: Negative for choking and stridor.   Gastrointestinal: Negative for vomiting or other signifcant bowel change Genitourinary: Negative for hematuria or decreased urine volume.  Musculoskeletal: Negative for other MSK pain or swelling Skin: Negative for color change and worsening wound.  Neurological: Negative for tremors and numbness other than noted  Psychiatric/Behavioral: Negative for decreased concentration or agitation other than above       Objective:   Physical Exam BP 122/64  Pulse 70  Temp(Src) 98.1 F (36.7 C) (Oral)  Wt 185 lb 4 oz (84.029 kg)  SpO2 98% VS noted, not ill appearing, somewhat fatigued and slowed in movements Constitutional: Pt appears well-developed, well-nourished.  HENT: Head: NCAT.  Right Ear: External ear normal.  Left Ear: External ear normal.  Eyes: . Pupils  are equal, round, and reactive to light. Conjunctivae and EOM are normal Neck: Normal range of motion. Neck supple.  Cardiovascular: Normal rate and regular rhythm.   Pulmonary/Chest: Effort normal and breath sounds normal.  Abd:  Soft, NT, ND, + BS Neurological: Pt is alert. Not confused , motor grossly intact Skin: Skin is warm. No rash Psychiatric: Pt behavior is normal. No agitation.     Assessment & Plan:

## 2014-02-20 NOTE — Progress Notes (Signed)
Pre visit review using our clinic review tool, if applicable. No additional management support is needed unless otherwise documented below in the visit note. 

## 2014-02-20 NOTE — Patient Instructions (Addendum)
Please continue all other medications as before, and refills have been done if requested, and you should be able to resume the full dose Imdur  Please have the pharmacy call with any other refills you may need.  Please continue your efforts at being more active, low cholesterol diet, and weight control.  Please keep your appointments with your specialists as you may have planned with Dr Lovena Le and Dr Hilarie Fredrickson; please stop by the third floor to make appt with Dr Hilarie Fredrickson  Please go to the LAB in the Basement (turn left off the elevator) for the tests to be done today  You will be contacted by phone if any changes need to be made immediately.  Otherwise, you will receive a letter about your results with an explanation, but please check with MyChart first.  Please remember to sign up for MyChart if you have not done so, as this will be important to you in the future with finding out test results, communicating by private email, and scheduling acute appointments online when needed.

## 2014-02-21 ENCOUNTER — Inpatient Hospital Stay: Payer: Commercial Managed Care - HMO | Admitting: Internal Medicine

## 2014-02-21 NOTE — Addendum Note (Signed)
Addended by: Biagio Borg on: 02/21/2014 08:20 AM   Modules accepted: Level of Service

## 2014-02-28 ENCOUNTER — Other Ambulatory Visit: Payer: Self-pay

## 2014-02-28 DIAGNOSIS — R7989 Other specified abnormal findings of blood chemistry: Secondary | ICD-10-CM

## 2014-03-01 ENCOUNTER — Telehealth: Payer: Self-pay | Admitting: Internal Medicine

## 2014-03-01 NOTE — Telephone Encounter (Signed)
The pt wants to stop taking Xarelto due to AVM and was advised that he would need to see Dr Lovena Le before he stops taking it.  He has been given an appointment to see Dr Lovena Le on 11/2 but is requesting a sooner appointment.  He is aware that I am sending this message to Dr Tanna Furry nurse and he is requesting a call back soon. Message sent to [facility], Dr Forde Dandy nurse.

## 2014-03-01 NOTE — Telephone Encounter (Signed)
New Message     Pt calling wanting to have an ABA/is this ok?

## 2014-03-02 ENCOUNTER — Other Ambulatory Visit (HOSPITAL_COMMUNITY): Payer: Self-pay | Admitting: *Deleted

## 2014-03-02 DIAGNOSIS — I714 Abdominal aortic aneurysm, without rupture, unspecified: Secondary | ICD-10-CM

## 2014-03-06 ENCOUNTER — Ambulatory Visit (HOSPITAL_COMMUNITY): Payer: Medicare HMO | Attending: Cardiovascular Disease | Admitting: Cardiology

## 2014-03-06 DIAGNOSIS — I739 Peripheral vascular disease, unspecified: Secondary | ICD-10-CM | POA: Insufficient documentation

## 2014-03-06 DIAGNOSIS — E785 Hyperlipidemia, unspecified: Secondary | ICD-10-CM | POA: Diagnosis not present

## 2014-03-06 DIAGNOSIS — Z951 Presence of aortocoronary bypass graft: Secondary | ICD-10-CM | POA: Diagnosis not present

## 2014-03-06 DIAGNOSIS — J449 Chronic obstructive pulmonary disease, unspecified: Secondary | ICD-10-CM | POA: Diagnosis not present

## 2014-03-06 DIAGNOSIS — I2510 Atherosclerotic heart disease of native coronary artery without angina pectoris: Secondary | ICD-10-CM | POA: Insufficient documentation

## 2014-03-06 DIAGNOSIS — I714 Abdominal aortic aneurysm, without rupture, unspecified: Secondary | ICD-10-CM

## 2014-03-06 DIAGNOSIS — I700 Atherosclerosis of aorta: Secondary | ICD-10-CM

## 2014-03-06 DIAGNOSIS — I10 Essential (primary) hypertension: Secondary | ICD-10-CM | POA: Insufficient documentation

## 2014-03-06 DIAGNOSIS — Z720 Tobacco use: Secondary | ICD-10-CM | POA: Insufficient documentation

## 2014-03-06 NOTE — Progress Notes (Signed)
Abdominal aorta duplex performed  

## 2014-03-13 ENCOUNTER — Other Ambulatory Visit (INDEPENDENT_AMBULATORY_CARE_PROVIDER_SITE_OTHER): Payer: Commercial Managed Care - HMO

## 2014-03-13 DIAGNOSIS — R7989 Other specified abnormal findings of blood chemistry: Secondary | ICD-10-CM

## 2014-03-13 DIAGNOSIS — D6489 Other specified anemias: Secondary | ICD-10-CM | POA: Diagnosis not present

## 2014-03-13 DIAGNOSIS — R946 Abnormal results of thyroid function studies: Secondary | ICD-10-CM | POA: Diagnosis not present

## 2014-03-13 LAB — CBC WITH DIFFERENTIAL/PLATELET
Basophils Absolute: 0 10*3/uL (ref 0.0–0.1)
Basophils Relative: 0.7 % (ref 0.0–3.0)
Eosinophils Absolute: 0.2 10*3/uL (ref 0.0–0.7)
Eosinophils Relative: 5 % (ref 0.0–5.0)
HEMATOCRIT: 32 % — AB (ref 39.0–52.0)
Hemoglobin: 10.2 g/dL — ABNORMAL LOW (ref 13.0–17.0)
LYMPHS ABS: 0.9 10*3/uL (ref 0.7–4.0)
LYMPHS PCT: 21.8 % (ref 12.0–46.0)
MCHC: 31.8 g/dL (ref 30.0–36.0)
MCV: 91.8 fl (ref 78.0–100.0)
MONOS PCT: 10.2 % (ref 3.0–12.0)
Monocytes Absolute: 0.4 10*3/uL (ref 0.1–1.0)
Neutro Abs: 2.7 10*3/uL (ref 1.4–7.7)
Neutrophils Relative %: 62.3 % (ref 43.0–77.0)
PLATELETS: 230 10*3/uL (ref 150.0–400.0)
RBC: 3.49 Mil/uL — ABNORMAL LOW (ref 4.22–5.81)
RDW: 16 % — ABNORMAL HIGH (ref 11.5–15.5)
WBC: 4.3 10*3/uL (ref 4.0–10.5)

## 2014-03-13 LAB — IBC PANEL
IRON: 166 ug/dL — AB (ref 42–165)
SATURATION RATIOS: 37.9 % (ref 20.0–50.0)
Transferrin: 312.9 mg/dL (ref 212.0–360.0)

## 2014-03-13 LAB — FERRITIN: Ferritin: 32.4 ng/mL (ref 22.0–322.0)

## 2014-03-13 LAB — TSH: TSH: 1.58 u[IU]/mL (ref 0.35–4.50)

## 2014-03-14 NOTE — Telephone Encounter (Signed)
Discussed with Dr Lovena Le and it is okay to stop Xarelto.  I have called and left a message with this information and for him to call me back to confirm understanding.

## 2014-03-15 DIAGNOSIS — E038 Other specified hypothyroidism: Secondary | ICD-10-CM

## 2014-03-15 DIAGNOSIS — I10 Essential (primary) hypertension: Secondary | ICD-10-CM

## 2014-03-15 DIAGNOSIS — D509 Iron deficiency anemia, unspecified: Secondary | ICD-10-CM

## 2014-03-19 ENCOUNTER — Encounter: Payer: Self-pay | Admitting: Internal Medicine

## 2014-03-19 ENCOUNTER — Ambulatory Visit (INDEPENDENT_AMBULATORY_CARE_PROVIDER_SITE_OTHER): Payer: Commercial Managed Care - HMO | Admitting: Internal Medicine

## 2014-03-19 VITALS — BP 124/82 | HR 55 | Ht 69.0 in | Wt 187.6 lb

## 2014-03-19 DIAGNOSIS — I10 Essential (primary) hypertension: Secondary | ICD-10-CM

## 2014-03-19 DIAGNOSIS — I4891 Unspecified atrial fibrillation: Secondary | ICD-10-CM

## 2014-03-19 DIAGNOSIS — I495 Sick sinus syndrome: Secondary | ICD-10-CM

## 2014-03-19 DIAGNOSIS — K5521 Angiodysplasia of colon with hemorrhage: Secondary | ICD-10-CM

## 2014-03-19 NOTE — Progress Notes (Signed)
HPI [name] returns today for followup. He is a very pleasant 79 year old man with ischemic heart disease, status post bypass surgery, status post stenting, who also has a history of atrial flutter status post catheter ablation and a history of atrial fibrillation. In the interim, the patient has experienced an episode of GI bleeding which required transfusion. He is thought to have GI AVM's. He was told to stop Xarelto but wanted to speak to me first. He denies chest pain or shortness of breath. No syncope. No palpitations. Allergies  Allergen Reactions  . Bee Venom Anaphylaxis    Only yellow jackets   . Promethazine Hcl Other (See Comments)    syncope  . Doxycycline Hyclate Other (See Comments)    esophagitis  . Lipitor [Atorvastatin Calcium] Other (See Comments)    Myalgia/myopathy  . Other Diarrhea and Nausea And Vomiting    Seafood  . Tetracycline Other (See Comments)    Esophagitis  . Crestor [Rosuvastatin] Other (See Comments)    Myalgia/myopathy  . Tetanus Toxoid Rash     Current Outpatient Prescriptions  Medication Sig Dispense Refill  . allopurinol (ZYLOPRIM) 300 MG tablet Take 300 mg by mouth daily.    Marland Kitchen amiodarone (PACERONE) 100 MG tablet Take 1 tablet (100 mg total) by mouth daily. 90 tablet 3  . aspirin EC 81 MG tablet Take 1 tablet (81 mg total) by mouth daily.    . calcium elemental as carbonate (TUMS ULTRA 1000) 400 MG tablet Chew 1,000 mg by mouth daily as needed for heartburn.    Marland Kitchen EPINEPHrine (EPIPEN) 0.3 mg/0.3 mL SOAJ injection Inject 0.3 mLs (0.3 mg total) into the muscle once. 2 Device 2  . Fe Fum-FePoly-Vit C-Vit B3 (INTEGRA) 62.5-62.5-40-3 MG CAPS Take 1 capsule by mouth daily. 30 capsule 3  . hydrocortisone cream 0.5 % Apply 1 application topically daily.     . Multiple Vitamin (MULTIVITAMIN) tablet Take 1 tablet by mouth at bedtime.     . nitroGLYCERIN (NITROSTAT) 0.4 MG SL tablet Place 1 tablet (0.4 mg total) under the tongue every 5 (five)  minutes as needed for chest pain. (Patient taking differently: Place 0.4 mg under the tongue every 5 (five) minutes as needed for chest pain (MAX 3 TABLETS). ) 25 tablet 5  . pantoprazole (PROTONIX) 40 MG tablet Take 40 mg by mouth daily.    Marland Kitchen SYNTHROID 100 MCG tablet TAKE 1 TABLET BY MOUTH EVERY DAY (Patient taking differently: Take 125 mcg by mouth daily before breakfast. TAKE 1 TABLET BY MOUTH EVERY DAY) 90 tablet 3  . tiotropium (SPIRIVA HANDIHALER) 18 MCG inhalation capsule Place 1 capsule (18 mcg total) into inhaler and inhale daily. 90 capsule 3   No current facility-administered medications for this visit.     Past Medical History  Diagnosis Date  . Hypothyroidism 12/05/2008  . HYPERLIPIDEMIA 09/19/2008  . GOUT 09/19/2008  . ANEMIA-IRON DEFICIENCY 09/19/2008  . HYPERTENSION 09/19/2008  . CAD 12/05/2008    s/p CABG; NSTEMI 05/2011 - LHC 05/25/11: LAD occluded, LIMA-LAD patent, ostial circumflex 50%, AV circumflex 70%, SVG-OM2 with extensive disease with thrombus (culprit vessel), RCA 80% and occluded, SVG-intermediate patent, SVG-PDA/PL A patent.  Given that the culprit vessel was a subtotally occluded heavily thrombotic graft to a smaller OM2, medical therapy was recommended.;   . Atrial fibrillation 04/19/2008    amiodarone rx;  Echocardiogram 05/26/11: No wall motion abnormalities, mild LVH, EF 60%.  . Atrial flutter 12/05/2008    s/p RFCA  .  BRADYCARDIA 12/05/2008  . AAA 09/19/2008  . PERIPHERAL VASCULAR DISEASE 09/19/2008  . GERD 09/19/2008  . DIVERTICULOSIS, COLON 09/19/2008  . RENAL INSUFFICIENCY 09/19/2008  . LOW BACK PAIN 09/19/2008  . PSA, INCREASED 09/19/2008  . COLONIC POLYPS, HX OF 09/19/2008    adenomatous polyps 07/2010  . GASTROINTESTINAL HEMORRHAGE, HX OF 04/19/2008  . NEPHROLITHIASIS, HX OF 01/11/2009  . CORONARY ARTERY BYPASS GRAFT, HX OF 12/05/2008    A. LIMA-LAD, VG-RI, VG-OM2, VG-RPD/RPL;  B. 05/2011 - NSTEMI - CATH WITH 4/5 PATENT GRAFTS AND NEW THROMBUS IN DISTAL VG-OM2 - MED RX  .  ANXIETY 06/13/2010  . Arthritis   . COPD (chronic obstructive pulmonary disease)   . Myocardial infarction   . Allergy   . Benign esophageal stricture 09/2075    dilated during EGD  . Erectile dysfunction 09/13/2012  . CKD (chronic kidney disease) 09/19/2008    Qualifier: Diagnosis of  By: Jenny Reichmann MD, Hunt Oris     ROS:   All systems reviewed and negative except as noted in the HPI.   Past Surgical History  Procedure Laterality Date  . Aorto-femoral bypass x5 - stress test neg 9/08    . Coronary artery bypass graft    . S/p afib ablation  2004  . Cardiac catheterization  05/25/2011  . Colon surgery    . Esophagogastroduodenoscopy  04/28/2012    Procedure: ESOPHAGOGASTRODUODENOSCOPY (EGD);  Surgeon: [name], MD;  Location: [place];  Service: Endoscopy;  Laterality: N/A;  . Enteroscopy N/A 02/13/2014    Procedure: ENTEROSCOPY;  Surgeon: [name], MD;  Location: [place];  Service: Gastroenterology;  Laterality: N/A;  super slim     Family History  Problem Relation Age of Onset  . Diabetes Father   . Diabetes Sister   . Multiple myeloma Mother   . Cancer Mother   . Other Mother     varicose veins  . Heart disease Paternal Uncle   . Heart disease Maternal Uncle   . Lymphoma Sister     half-sister     History   Social History  . Marital Status: Married    Spouse Name: Omar Benson    Number of Children: N/A  . Years of Education: N/A   Occupational History  . self employeed - Contractor    Social History Main Topics  . Smoking status: Former Smoker -- 4.00 packs/day for 31 years    Types: Cigarettes    Quit date: 03/19/1983  . Smokeless tobacco: Never Used  . Alcohol Use: 6.0 oz/week    10 Shots of liquor per week  . Drug Use: No  . Sexual Activity: Yes   Other Topics Concern  . Not on file   Social History Narrative   Married lives with his wife     BP 124/82 mmHg  Pulse 55  Ht _0  (1.753 m)  Wt 187 lb 9.6 oz (85.095 kg)  BMI 27.69  kg/m2  Physical Exam:  Well appearing 79 year old man,NAD HEENT: Unremarkable Neck:  No JVD, no thyromegally Back:  No CVA tenderness Lungs:  Clear with no wheezes, rales, or rhonchi. HEART:  Regular rate rhythm, no murmurs, no rubs, no clicks Abd:  soft, positive bowel sounds, no organomegally, no rebound, no guarding Ext:  2 plus pulses, no edema, no cyanosis, no clubbing Skin:  No rashes no nodules Neuro:  CN II through XII intact, motor grossly intact  EKG - sinus bradycardia with left axis deviation   Assess/Plan:

## 2014-03-19 NOTE — Assessment & Plan Note (Signed)
He remains mostly asymptomatic. No change in meds. A PPM has been considered in the past but he is not symptomatic enough to warrant ppm.

## 2014-03-19 NOTE — Patient Instructions (Signed)
Your physician wants you to follow-up in: 6 months with Dr Taylor You will receive a reminder letter in the mail two months in advance. If you don't receive a letter, please call our office to schedule the follow-up appointment.  

## 2014-03-19 NOTE — Assessment & Plan Note (Signed)
Hopefully with discontinuation of Xarelto, he will not have additional bleeding.

## 2014-03-19 NOTE — Assessment & Plan Note (Signed)
His blood pressure is well controlled. He will continue his current meds. I asked him to reduce his sodium intake.

## 2014-03-19 NOTE — Assessment & Plan Note (Signed)
He is maintaining NSR. I discussed stopping systemic anticoagulation and after a long discussion, I feel the risk/benefit would suggest stopping Xarelto is the best option. He will continue amiodarone.

## 2014-04-26 ENCOUNTER — Encounter (HOSPITAL_COMMUNITY): Payer: Self-pay | Admitting: Cardiology

## 2014-04-27 ENCOUNTER — Encounter: Payer: Self-pay | Admitting: Family Medicine

## 2014-04-27 ENCOUNTER — Ambulatory Visit (INDEPENDENT_AMBULATORY_CARE_PROVIDER_SITE_OTHER): Payer: Commercial Managed Care - HMO | Admitting: Family Medicine

## 2014-04-27 ENCOUNTER — Ambulatory Visit (INDEPENDENT_AMBULATORY_CARE_PROVIDER_SITE_OTHER): Payer: Commercial Managed Care - HMO

## 2014-04-27 VITALS — BP 140/70 | HR 60 | Temp 98.2°F | Resp 16 | Ht 69.0 in | Wt 188.4 lb

## 2014-04-27 DIAGNOSIS — S39012A Strain of muscle, fascia and tendon of lower back, initial encounter: Secondary | ICD-10-CM

## 2014-04-27 DIAGNOSIS — M545 Low back pain: Secondary | ICD-10-CM

## 2014-04-27 MED ORDER — METHOCARBAMOL 500 MG PO TABS: 500.0000 mg | ORAL_TABLET | Freq: Three times a day (TID) | ORAL | Status: DC | PRN

## 2014-04-27 NOTE — Progress Notes (Signed)
[facility] [address], [place] [address] [phone]  Date:  04/27/2014   Name:  Omar Benson   DOB:  21-Aug-1934   MRN:  [id number]  PCP:  [name], MD    Chief Complaint: Back Pain   History of Present Illness:  Omar Benson is a 79 y.o. very pleasant male patient who presents with the following:  Older gentleman with multiple medical problems as below.   He has noted some lower back pain for a couple of weeks. He was doing heavy yard work- using a "ditch Office manager for several hours on one day the week before thanksgiving.    He has tried heat, advil, muscle rub, chiropractor, and a back brace but he continues to note bilateral LBP.   No pain into his legs, no numbness or weakness in his legs.  He also notes pains into his sides.  No bowel or bladder dysfunction No GI symptoms.   He had a GI bleed in September- he had a cautery treatment for this and is now off xarelto.     He has had some upper back issues in the past but never this low.    He reports that his known AAA is followed on a regular basis by a provider not in out system Patient Active Problem List   Diagnosis Date Noted  . Angiodysplasia of small intestine, except duodenum with bleeding 02/13/2014  . Acute GI bleeding 02/12/2014  . Acute blood loss anemia 02/12/2014  . Skin lesion of face 12/31/2013  . Enlarged salivary gland 12/31/2013  . Right shoulder pain 06/27/2013  . Rash and nonspecific skin eruption 06/27/2013  . Erectile dysfunction 09/13/2012  . Acute sinus infection 08/22/2012  . Allergic rhinitis, cause unspecified 08/22/2012  . GI AVM (gastrointestinal arteriovenous vascular malformation) 04/28/2012  . Stricture and stenosis of esophagus 04/28/2012  . GI bleed 04/26/2012  . Non-STEMI (non-ST elevated myocardial infarction) 05/24/2011  . COPD (chronic obstructive pulmonary disease) 09/08/2010  . Hypothyroid 08/21/2010  . Preventative health care 08/21/2010  .  GASTRITIS 07/21/2010  . ANXIETY 06/13/2010  . NEPHROLITHIASIS, HX OF 01/11/2009  . CAD 12/05/2008  . ATRIAL FLUTTER, CHRONIC 12/05/2008  . Sinus node dysfunction 12/05/2008  . HYPERLIPIDEMIA 09/19/2008  . GOUT 09/19/2008  . Iron deficiency anemia 09/19/2008  . Essential hypertension 09/19/2008  . AAA 09/19/2008  . PERIPHERAL VASCULAR DISEASE 09/19/2008  . GERD 09/19/2008  . DIVERTICULOSIS, COLON 09/19/2008  . CKD (chronic kidney disease) 09/19/2008  . LOW BACK PAIN 09/19/2008  . PSA, INCREASED 09/19/2008  . COLONIC POLYPS, HX OF 09/19/2008  . Atrial fibrillation 04/19/2008  . GASTROINTESTINAL HEMORRHAGE, HX OF 04/19/2008    Past Medical History  Diagnosis Date  . Hypothyroidism 12/05/2008  . HYPERLIPIDEMIA 09/19/2008  . GOUT 09/19/2008  . ANEMIA-IRON DEFICIENCY 09/19/2008  . HYPERTENSION 09/19/2008  . CAD 12/05/2008    s/p CABG; NSTEMI 05/2011 - LHC 05/25/11: LAD occluded, LIMA-LAD patent, ostial circumflex 50%, AV circumflex 70%, SVG-OM2 with extensive disease with thrombus (culprit vessel), RCA 80% and occluded, SVG-intermediate patent, SVG-PDA/PL A patent.  Given that the culprit vessel was a subtotally occluded heavily thrombotic graft to a smaller OM2, medical therapy was recommended.;   . Atrial fibrillation 04/19/2008    amiodarone rx;  Echocardiogram 05/26/11: No wall motion abnormalities, mild LVH, EF 60%.  . Atrial flutter 12/05/2008    s/p RFCA  . BRADYCARDIA 12/05/2008  . AAA 09/19/2008  . PERIPHERAL VASCULAR DISEASE 09/19/2008  .  GERD 09/19/2008  . DIVERTICULOSIS, COLON 09/19/2008  . RENAL INSUFFICIENCY 09/19/2008  . LOW BACK PAIN 09/19/2008  . PSA, INCREASED 09/19/2008  . COLONIC POLYPS, HX OF 09/19/2008    adenomatous polyps 07/2010  . GASTROINTESTINAL HEMORRHAGE, HX OF 04/19/2008  . NEPHROLITHIASIS, HX OF 01/11/2009  . CORONARY ARTERY BYPASS GRAFT, HX OF 12/05/2008    A. LIMA-LAD, VG-RI, VG-OM2, VG-RPD/RPL;  B. 05/2011 - NSTEMI - CATH WITH 4/5 PATENT GRAFTS AND NEW THROMBUS IN DISTAL VG-OM2  - MED RX  . ANXIETY 06/13/2010  . Arthritis   . COPD (chronic obstructive pulmonary disease)   . Myocardial infarction   . Allergy   . Benign esophageal stricture 09/2075    dilated during EGD  . Erectile dysfunction 09/13/2012  . CKD (chronic kidney disease) 09/19/2008    Qualifier: Diagnosis of  By: Jenny Reichmann MD, Hunt Oris     Past Surgical History  Procedure Laterality Date  . Aorto-femoral bypass x5 - stress test neg 9/08    . Coronary artery bypass graft    . S/p afib ablation  2004  . Cardiac catheterization  05/25/2011  . Colon surgery    . Esophagogastroduodenoscopy  04/28/2012    Procedure: ESOPHAGOGASTRODUODENOSCOPY (EGD);  Surgeon: [name], MD;  Location: [facility];  Service: Endoscopy;  Laterality: N/A;  . Enteroscopy N/A 02/13/2014    Procedure: ENTEROSCOPY;  Surgeon: [name], MD;  Location: [facility];  Service: Gastroenterology;  Laterality: N/A;  super slim  . Left heart catheterization with coronary/graft angiogram  05/25/2011    Procedure: LEFT HEART CATHETERIZATION WITH [name];  Surgeon: [name], MD;  Location[facility] LAB;  Service: Cardiovascular;;    History  Substance Use Topics  . Smoking status: Former Smoker -- 4.00 packs/day for 31 years    Types: Cigarettes    Quit date: 03/19/1983  . Smokeless tobacco: Never Used  . Alcohol Use: 6.0 oz/week    10 Shots of liquor per week    Family History  Problem Relation Age of Onset  . Diabetes Father   . Diabetes Sister   . Multiple myeloma Mother   . Cancer Mother   . Other Mother     varicose veins  . Heart disease Paternal Uncle   . Heart disease Maternal Uncle   . Lymphoma Sister     half-sister    Allergies  Allergen Reactions  . Bee Venom Anaphylaxis    Only yellow jackets   . Promethazine Hcl Other (See Comments)    syncope  . Doxycycline Hyclate Other (See Comments)    esophagitis  . Lipitor [Atorvastatin Calcium] Other (See Comments)    Myalgia/myopathy   . Other Diarrhea and Nausea And Vomiting    Seafood  . Tetracycline Other (See Comments)    Esophagitis  . Crestor [Rosuvastatin] Other (See Comments)    Myalgia/myopathy  . Tetanus Toxoid Rash    Medication list has been reviewed and updated.  Current Outpatient Prescriptions on File Prior to Visit  Medication Sig Dispense Refill  . allopurinol (ZYLOPRIM) 300 MG tablet Take 300 mg by mouth daily.    Marland Kitchen amiodarone (PACERONE) 100 MG tablet Take 1 tablet (100 mg total) by mouth daily. 90 tablet 3  . aspirin EC 81 MG tablet Take 1 tablet (81 mg total) by mouth daily.    . calcium elemental as carbonate (TUMS ULTRA 1000) 400 MG tablet Chew 1,000 mg by mouth daily as needed for heartburn.    Marland Kitchen EPINEPHrine (  EPIPEN) 0.3 mg/0.3 mL SOAJ injection Inject 0.3 mLs (0.3 mg total) into the muscle once. 2 Device 2  . Fe Fum-FePoly-Vit C-Vit B3 (INTEGRA) 62.5-62.5-40-3 MG CAPS Take 1 capsule by mouth daily. 30 capsule 3  . hydrocortisone cream 0.5 % Apply 1 application topically daily.     . Multiple Vitamin (MULTIVITAMIN) tablet Take 1 tablet by mouth at bedtime.     . nitroGLYCERIN (NITROSTAT) 0.4 MG SL tablet Place 1 tablet (0.4 mg total) under the tongue every 5 (five) minutes as needed for chest pain. (Patient taking differently: Place 0.4 mg under the tongue every 5 (five) minutes as needed for chest pain (MAX 3 TABLETS). ) 25 tablet 5  . pantoprazole (PROTONIX) 40 MG tablet Take 40 mg by mouth daily.    Marland Kitchen SYNTHROID 100 MCG tablet TAKE 1 TABLET BY MOUTH EVERY DAY (Patient taking differently: Take 125 mcg by mouth daily before breakfast. TAKE 1 TABLET BY MOUTH EVERY DAY) 90 tablet 3  . tiotropium (SPIRIVA HANDIHALER) 18 MCG inhalation capsule Place 1 capsule (18 mcg total) into inhaler and inhale daily. 90 capsule 3   No current facility-administered medications on file prior to visit.    Review of Systems:  As per HPI- otherwise negative.   Physical Examination: Filed Vitals:   04/27/14  1516  BP: 140/70  Pulse: 60  Temp: 98.2 F (36.8 C)  Resp: 16   Filed Vitals:   04/27/14 1516  Height: $Remove'5\' 9"'LgsRZCb$  (1.753 m)  Weight: 188 lb 6.4 oz (85.458 kg)   Body mass index is 27.81 kg/(m^2). Ideal Body Weight: Weight in (lb) to have BMI = 25: 168.9  GEN: WDWN, NAD, Non-toxic, A & O x 3 HEENT: Atraumatic, Normocephalic. Neck supple. No masses, No LAD. Ears and Nose: No external deformity. CV: RRR, No M/G/R. No JVD. No thrill. No extra heart sounds. PULM: CTA B, no wheezes, crackles, rhonchi. No retractions. No resp. distress. No accessory muscle use. ABD: S, NT, ND, +BS. No rebound. No HSM.  Benign exam EXTR: No c/c/e NEURO Normal gait.  PSYCH: Normally interactive. Conversant. Not depressed or anxious appearing.  Calm demeanor.  Back: tenderness and spasm over the bilateral lumbar muscles.  Normal BLE strength, sensation and DTR.  Some discomfort with SLR more on the right but no paraesthesias.  Normal lumbar flexion, restricted extension  [facility] reading (PRIMARY) by  Dr. Lorelei Pont. L spine:  Significant degenerative change especially at L1- L3.  No fracture  LUMBAR SPINE - COMPLETE 4+ VIEW  COMPARISON: CT 01/11/2009.  FINDINGS: Mild L1 compression fracture, age undetermined. This is new from prior CT of 2010. Diffuse degenerative change. Normal mineralization and alignment. Aortoiliac atherosclerotic vascular disease. Aneurysmal dilatation of the abdominal aorta 3.7 cm noted. Aortic ultrasound suggested for further evaluation.  IMPRESSION: 1. L1 compression fracture, age undetermined. This compression fracture is new from prior CT of 01/11/2009. Diffuse lumbar spine degenerative change is present. 2. Aneurysmal dilatation of the abdominal aorta to 3.7 cm noted. Aortic ultrasound suggested for further evaluation.  Assessment and Plan: Lumbar strain, initial encounter - Plan: DG Lumbar Spine Complete, methocarbamol (ROBAXIN) 500 MG tablet  Low back pain without sciatica,  unspecified back pain laterality  Lumbar strain from using a "ditch witch" tiller for several hours a couple of weeks ago.  No evidence or neurological compromise.  Will try robaxin as needed, and plan follow-up if not better soon.    Called with over-read report.  Mild L1 compression, age undetermined.  No other tx  necessary unless it continues to bother him, in that case may want to pursue MRI.  Counseled to avoid alcohol with robaxin Also it looks like his AAA is larger than he had been told most recently (explained that plain film is not the best way to measure diameter here).  This is monitored regularly but a provider outside of the [facility] system per his report. Will mail him a copy of this report to share with this provider.   Signed Lamar Blinks, MD

## 2014-04-27 NOTE — Patient Instructions (Signed)
Your back shows significant arthritis- however I think your pain today is also due to muscle strain.  Try the robaxin muscle relaxer as needed but remember this can make you sleepy.  Heat is also a good idea.  Let me know if you do not feel better soon!

## 2014-05-07 ENCOUNTER — Telehealth: Payer: Self-pay | Admitting: Internal Medicine

## 2014-05-07 NOTE — Telephone Encounter (Signed)
Can he do 1245 tomorrow.

## 2014-05-07 NOTE — Telephone Encounter (Signed)
Called pt, scheduled him for 1245 tomorrow.

## 2014-05-07 NOTE — Telephone Encounter (Signed)
Pt request to be work in for really bad back pain, Please advise

## 2014-05-08 ENCOUNTER — Ambulatory Visit (INDEPENDENT_AMBULATORY_CARE_PROVIDER_SITE_OTHER)
Admission: RE | Admit: 2014-05-08 | Discharge: 2014-05-08 | Disposition: A | Discharge status: Home / Self Care | Payer: Commercial Managed Care - HMO | Source: Ambulatory Visit | Attending: Family Medicine | Admitting: Family Medicine

## 2014-05-08 ENCOUNTER — Encounter: Payer: Self-pay | Admitting: Family Medicine

## 2014-05-08 ENCOUNTER — Ambulatory Visit (INDEPENDENT_AMBULATORY_CARE_PROVIDER_SITE_OTHER): Payer: Commercial Managed Care - HMO | Admitting: Family Medicine

## 2014-05-08 VITALS — BP 128/82 | HR 62 | Wt 184.0 lb

## 2014-05-08 DIAGNOSIS — M549 Dorsalgia, unspecified: Secondary | ICD-10-CM

## 2014-05-08 MED ORDER — TRAMADOL HCL 50 MG PO TABS: 50.0000 mg | ORAL_TABLET | Freq: Two times a day (BID) | ORAL | Status: DC | PRN

## 2014-05-08 MED ORDER — CALCITONIN (SALMON) 200 UNIT/ACT NA SOLN: 1.0000 | Freq: Every day | NASAL | Status: DC

## 2014-05-08 NOTE — Patient Instructions (Signed)
Good to see you Vitamin D 2000 IU daily Tramadol up to 3 times daily for breakthrough pain Xray downstairs today.  Ice 20 minutes 2 times a day Calcitonin 1 spray daily.  Call me on Monday and if still in pain we will get MRI for further evaluation.

## 2014-05-08 NOTE — Assessment & Plan Note (Signed)
Patient does have an acute pain and it seems to be midline. Patient has x-rays previously that did show an L1 compression fracture. Patient is having pain midline of the thoracic spine and I would like to get further imaging. There could be another compression fracture. Patient does have a history of iron deficiency anemia as well as a past medical history significant for multiple myeloma. Patient has chronic kidney disease and likely is not absorbing vitamin D. We discussed supplementation as well as put patient on a short-term calcitonin and warned the potential side effects. Differential also includes cardiovascular but this does not seem to be worse with exacerbation. Patient denies any significant chest pain associated with it. Denies any shortness of breath. Differential also includes abdominal discomfort and GI pathology. Patient states his is not feel any similar to his previous pains he is had in the stomach. We will treat this is more of a muscular skeletal component. Patient continues to have back pain even after this conservative therapy we need to consider further imaging including an MRI. Patient knows when to seek medical attention if any chest pain or any severe worsening pain or any radicular symptoms causing weakness or numbness of the lower extremity is.

## 2014-05-08 NOTE — Progress Notes (Signed)
[facility] [place], [place] [address] Phone: [phone] Subjective:    I'm seeing this patient by the request  of:  [name], MD   CC: Acute thoracic and low back pain [id number] Omar Benson is a 79 y.o. male coming in with complaint of low back pain. Patient was seen 10 days ago at urgent care patient did have significant amount of back pain. Patient did have imaging that did show an L1 compression fracture but age was unknown. Patient states that the pain seems to be mostly midline but states that more of it seems to be in the thoracic spine. States that it is a sharp discomfort. Patient states that since stop him from activity. Patient does have a history of age of fibrillation as well as coronary artery disease status post bypass. Patient denies any association with exacerbation or with stomach discomfort. Patient has had GI bleed previously as well. Patient has been taking Aleve to try to help with the pain. Patient was given a muscle relaxer without any significant improvement. Patient rates the severity of pain 9 out of 10. Denies any fever, chills, or any abnormal weight loss. Patient's does have a past medical history significant for multiple myeloma. Patient also has history of an abdominal aortic aneurysm that was seen and followed by another provider. Patient's x-ray showed 3.7 cm which would be an enlargement from previous exam but this is compared to an x-ray to an ultrasound. Patient denies any abdominal pain.    Past medical history, social, surgical and family history all reviewed in electronic medical record.   Review of Systems: No headache, visual changes, nausea, vomiting, diarrhea, constipation, dizziness, abdominal pain, skin rash, fevers, chills, night sweats, weight loss, swollen lymph nodes, body aches, joint swelling, muscle aches, chest pain, shortness of breath, mood changes.   Objective Blood pressure 128/82, pulse  62, weight 184 lb (83.462 kg), SpO2 97 %.  General: No apparent distress alert and oriented x3 mood and affect normal, dressed appropriately.  HEENT: Pupils equal, extraocular movements intact  Respiratory: Patient's speak in full sentences and does not appear short of breath  Cardiovascular: No lower extremity edema, non tender, no erythema  Skin: Warm dry intact with no signs of infection or rash on extremities or on axial skeleton.  Abdomen: Soft nontender  Neuro: Cranial nerves II through XII are intact, neurovascularly intact in all extremities with 2+ DTRs and 2+ pulses.  Lymph: No lymphadenopathy of posterior or anterior cervical chain or axillae bilaterally.  Gait normal with good balance and coordination.  MSK:  Non tender with full range of motion and good stability and symmetric strength and tone of shoulders, elbows, wrist, hip, knee and ankles bilaterally.  Back Exam:  Inspection: Kyphosis noted of the thoracic spine as well as decreased loss of the lordosis of the lumbar spine Motion: Flexion 35 deg, Extension 35 deg, Side Bending to 35 deg bilaterally,  Rotation to 35 deg bilaterally  SLR laying: Negative  XSLR laying: Negative  Palpable tenderness: Severe midline tenderness between the scapulas. Some pain midline over the L1 vertebrae. FABER: negative. Sensory change: Gross sensation intact to all lumbar and sacral dermatomes.  Reflexes: 2+ at both patellar tendons, 2+ at achilles tendons, Babinski's downgoing.  Strength at foot  Plantar-flexion: 5/5 Dorsi-flexion: 5/5 Eversion: 5/5 Inversion: 5/5  Leg strength  Quad: 5/5 Hamstring: 5/5 Hip flexor: 4/5 Hip abductors: 4/5  Gait unremarkable.     Impression and  Recommendations:     This case required medical decision making of moderate complexity.

## 2014-05-14 ENCOUNTER — Telehealth: Payer: Self-pay | Admitting: Internal Medicine

## 2014-05-14 DIAGNOSIS — M5416 Radiculopathy, lumbar region: Secondary | ICD-10-CM

## 2014-05-14 NOTE — Telephone Encounter (Signed)
Pt told to call back today if back was not better today. He said his back is not better, he saw Dr Tamala Julian recently. Pls advise.

## 2014-05-15 NOTE — Telephone Encounter (Signed)
We will order MRI of lumbar spine.

## 2014-05-15 NOTE — Telephone Encounter (Signed)
I will forward message to Dr Tamala Julian , as the treating provider for this issue

## 2014-05-16 NOTE — Telephone Encounter (Signed)
Order entered

## 2014-05-30 ENCOUNTER — Other Ambulatory Visit: Payer: Self-pay | Admitting: Pulmonary Disease

## 2014-05-31 ENCOUNTER — Ambulatory Visit (INDEPENDENT_AMBULATORY_CARE_PROVIDER_SITE_OTHER): Payer: Commercial Managed Care - HMO | Admitting: Family Medicine

## 2014-05-31 ENCOUNTER — Encounter: Payer: Self-pay | Admitting: Family Medicine

## 2014-05-31 VITALS — BP 124/72 | HR 68 | Ht 69.0 in | Wt 186.0 lb

## 2014-05-31 DIAGNOSIS — M549 Dorsalgia, unspecified: Secondary | ICD-10-CM

## 2014-05-31 DIAGNOSIS — M546 Pain in thoracic spine: Secondary | ICD-10-CM

## 2014-05-31 MED ORDER — GABAPENTIN 100 MG PO CAPS: 100.0000 mg | ORAL_CAPSULE | Freq: Every day | ORAL | Status: DC

## 2014-05-31 NOTE — Progress Notes (Signed)
[facility] [place], [place] [address] Phone: [phone] Subjective:    I'm seeing this patient by the request  of:  [name], MD   CC: Acute thoracic and low back pain [id number] Omar Benson is a 80 y.o. male coming in with complaint of low back pain. Patient was seen 10 days ago at urgent care patient did have significant amount of back pain. Patient did have imaging that did show an L1 compression fracture but age was unknown. Patient states that the pain seems to be mostly midline but states that more of it seems to be in the thoracic spine. States that it is a sharp discomfort. Patient states that since stop him from activity. Patient does have a history of age of fibrillation as well as coronary artery disease status post bypass. Patient denies any association with exacerbation or with stomach discomfort. Patient has had GI bleed previously as well. Patient was seen previously and we did start patient on calcitonin for the possibility of his pain being from the compression fracture. Patient states that this has helped out minimally. Patient has started some of the over-the-counter medications with minimal improvement as well. Patient can continue his daily activities but does have severe pain with certain movements. Patient says that the pain can wake him up at night. Patient states that when he had this pain multiple years ago he did respond fairly well to formal physical therapy and is wondering if that is the possibility.  Patient also has history of an abdominal aortic aneurysm that was seen and followed by another provider. Patient's x-ray showed 3.7 cm which would be an enlargement from previous exam but this is compared to an x-ray to an ultrasound. Patient denies any abdominal pain. Patient would like to avoid further workup/.     Past medical history, social, surgical and family history all reviewed in electronic medical record.    Review of Systems: No headache, visual changes, nausea, vomiting, diarrhea, constipation, dizziness, abdominal pain, skin rash, fevers, chills, night sweats, weight loss, swollen lymph nodes, body aches, joint swelling, muscle aches, chest pain, shortness of breath, mood changes.   Objective Blood pressure 124/72, pulse 68, height 5\' 9"  (1.753 m), weight 186 lb (84.369 kg), SpO2 95 %.  General: No apparent distress alert and oriented x3 mood and affect normal, dressed appropriately.  HEENT: Pupils equal, extraocular movements intact  Respiratory: Patient's speak in full sentences and does not appear short of breath  Cardiovascular: No lower extremity edema, non tender, no erythema  Skin: Warm dry intact with no signs of infection or rash on extremities or on axial skeleton.  Abdomen: Soft nontender  Neuro: Cranial nerves II through XII are intact, neurovascularly intact in all extremities with 2+ DTRs and 2+ pulses.  Lymph: No lymphadenopathy of posterior or anterior cervical chain or axillae bilaterally.  Gait normal with good balance and coordination.  MSK:  Non tender with full range of motion and good stability and symmetric strength and tone of shoulders, elbows, wrist, hip, knee and ankles bilaterally.  Back Exam:  Inspection: Kyphosis noted of the thoracic spine as well as decreased loss of the lordosis of the lumbar spine Motion: Flexion 35 deg, Extension 35 deg, Side Bending to 35 deg bilaterally,  Rotation to 35 deg bilaterally  SLR laying: Negative  XSLR laying: Negative  Palpable tenderness: Severe midline tenderness between the scapulas. Better with less pain midline over the L1 vertebrae. FABER: negative.  Sensory change: Gross sensation intact to all lumbar and sacral dermatomes.  Reflexes: 2+ at both patellar tendons, 2+ at achilles tendons, Babinski's downgoing.  Strength at foot  Plantar-flexion: 5/5 Dorsi-flexion: 5/5 Eversion: 5/5 Inversion: 5/5  Leg strength  Quad:  5/5 Hamstring: 5/5 Hip flexor: 4/5 Hip abductors: 4/5  Gait unremarkable. Mild decrease in pain from previous exam.    Impression and Recommendations:     This case required medical decision making of moderate complexity.

## 2014-05-31 NOTE — Patient Instructions (Addendum)
Good to see you Ice is your friend Physical therapy will be calling you Try gabapentin 100mg  at night Stop the nose spray after another 2 weeks.  See me again in 4 weeks.

## 2014-05-31 NOTE — Progress Notes (Signed)
Pre visit review using our clinic review tool, if applicable. No additional management support is needed unless otherwise documented below in the visit note. 

## 2014-05-31 NOTE — Assessment & Plan Note (Signed)
Patient does have significant other comorbidities that I do think it contributing. Patient's x-rays do show degenerative disc disease at multiple levels and osteophytic changes that are likely given some nerve root impingement.. Patient to continue the over-the-counter medications as well as patient was started on gabapentin to see if this will help. This will probably be the safest and we will not titrate up secondary to his other medications and comorbidities. Patient was warned of potential side effects. We will refer patient to formal physical therapy to see if they can help with some of the thoracic back strengthening and postural  Training. Patient and will come back and see me again in 4 weeks. We once again discussed the possibility of the abdominal aortic aneurysm that could be contributing especially with the change in size. Patient though declined stating that has been stable for years and he does not feel that this is causing it. He would be open to the possibility of an ultrasound in April but it is not when discussing until then.  Spent  25 minutes with patient face-to-face and had greater than 50% of counseling including as described above in assessment and plan.

## 2014-06-07 ENCOUNTER — Ambulatory Visit: Payer: Commercial Managed Care - HMO | Admitting: Physical Therapy

## 2014-06-15 ENCOUNTER — Other Ambulatory Visit: Payer: Self-pay | Admitting: Emergency Medicine

## 2014-06-18 ENCOUNTER — Ambulatory Visit: Payer: Medicare Other | Attending: Internal Medicine

## 2014-06-18 DIAGNOSIS — M546 Pain in thoracic spine: Secondary | ICD-10-CM | POA: Diagnosis not present

## 2014-06-18 DIAGNOSIS — R293 Abnormal posture: Secondary | ICD-10-CM

## 2014-06-18 DIAGNOSIS — M2560 Stiffness of unspecified joint, not elsewhere classified: Secondary | ICD-10-CM | POA: Insufficient documentation

## 2014-06-18 NOTE — Therapy (Signed)
[place], [place], [address] Phone: [phone]   Fax:  [phone]  Physical Therapy Evaluation  Patient Details  Name: Omar Benson MRN: [id number] Date of Birth: 20-May-1934 Referring Provider:  Biagio Borg, MD  Encounter Date: 06/18/2014      PT End of Session - 06/18/14 1507    Visit Number 1   Number of Visits 12   Date for PT Re-Evaluation 07/31/14   PT Start Time 7619   PT Stop Time 1505   PT Time Calculation (min) 50 min      Past Medical History  Diagnosis Date  . Hypothyroidism 12/05/2008  . HYPERLIPIDEMIA 09/19/2008  . GOUT 09/19/2008  . ANEMIA-IRON DEFICIENCY 09/19/2008  . HYPERTENSION 09/19/2008  . CAD 12/05/2008    s/p CABG; NSTEMI 05/2011 - LHC 05/25/11: LAD occluded, LIMA-LAD patent, ostial circumflex 50%, AV circumflex 70%, SVG-OM2 with extensive disease with thrombus (culprit vessel), RCA 80% and occluded, SVG-intermediate patent, SVG-PDA/PL A patent.  Given that the culprit vessel was a subtotally occluded heavily thrombotic graft to a smaller OM2, medical therapy was recommended.;   . Atrial fibrillation 04/19/2008    amiodarone rx;  Echocardiogram 05/26/11: No wall motion abnormalities, mild LVH, EF 60%.  . Atrial flutter 12/05/2008    s/p RFCA  . BRADYCARDIA 12/05/2008  . AAA 09/19/2008  . PERIPHERAL VASCULAR DISEASE 09/19/2008  . GERD 09/19/2008  . DIVERTICULOSIS, COLON 09/19/2008  . RENAL INSUFFICIENCY 09/19/2008  . LOW BACK PAIN 09/19/2008  . PSA, INCREASED 09/19/2008  . COLONIC POLYPS, HX OF 09/19/2008    adenomatous polyps 07/2010  . GASTROINTESTINAL HEMORRHAGE, HX OF 04/19/2008  . NEPHROLITHIASIS, HX OF 01/11/2009  . CORONARY ARTERY BYPASS GRAFT, HX OF 12/05/2008    A. LIMA-LAD, VG-RI, VG-OM2, VG-RPD/RPL;  B. 05/2011 - NSTEMI - CATH WITH 4/5 PATENT GRAFTS AND NEW THROMBUS IN DISTAL VG-OM2 - MED RX  . ANXIETY 06/13/2010  . Arthritis   . COPD (chronic obstructive pulmonary disease)   . Myocardial  infarction   . Allergy   . Benign esophageal stricture 09/2075    dilated during EGD  . Erectile dysfunction 09/13/2012  . CKD (chronic kidney disease) 09/19/2008    Qualifier: Diagnosis of  By: Jenny Reichmann MD, Hunt Oris     Past Surgical History  Procedure Laterality Date  . Aorto-femoral bypass x5 - stress test neg 9/08    . Coronary artery bypass graft    . S/p afib ablation  2004  . Cardiac catheterization  05/25/2011  . Colon surgery    . Esophagogastroduodenoscopy  04/28/2012    Procedure: ESOPHAGOGASTRODUODENOSCOPY (EGD);  Surgeon: [name], MD;  Location: [place];  Service: Endoscopy;  Laterality: N/A;  . Enteroscopy N/A 02/13/2014    Procedure: ENTEROSCOPY;  Surgeon: [name], MD;  Location: [place];  Service: Gastroenterology;  Laterality: N/A;  super slim  . Left heart catheterization with coronary/graft angiogram  05/25/2011    Procedure: LEFT HEART CATHETERIZATION WITH [name];  Surgeon: [name], MD;  Location: [facility] C[facility] LAB;  Service: Cardiovascular;;    There were no vitals taken for this visit.  Visit Diagnosis:  Joint stiffness of spine - Plan: PT plan of care cert/re-cert  Pain in thoracic spine - Plan: PT plan of care cert/re-cert  Abnormal posture - Plan: PT plan of care cert/re-cert      Subjective Assessment - 06/18/14 1424    Symptoms 15 year pain but more pain after working  with [employer]. Pain last a few minutes and pain comes and goes. Have done some stretching without benefis.    Pertinent History He began to ahve knife feeling in to back to front and along ribs between scapula.    Limitations --  He can do anything but has onset of pain wihtout warning   How long can you sit comfortably? As needed   How long can you stand comfortably? As needed   How long can you walk comfortably? As needed   Diagnostic tests xrays OA   Patient Stated Goals Decreae pain     Currently in Pain? Yes   Pain Location Thoracic   Pain  Orientation Posterior  central   Pain Descriptors / Indicators Burning;Pressure;Stabbing;Sore;Tender   Pain Type Chronic pain   Pain Onset More than a month ago   Pain Frequency Intermittent   Aggravating Factors  Not sure   Pain Relieving Factors [place] , linament, heat   Multiple Pain Sites No          [facility] PT Assessment - 06/18/14 1422    Assessment   Medical Diagnosis Thoraci back pain   Onset Date --  15 years ago   Prior Therapy He ha had therapy before.   Precautions   Precautions None   Restrictions   Weight Bearing Restrictions No   Balance Screen   Has the patient fallen in the past 6 months No   Has the patient had a decrease in activity level because of a fear of falling?  No   Is the patient reluctant to leave their home because of a fear of falling?  No   Posture/Postural Control   Posture Comments mild thoracic kyphosis, with decrease lumba lordosis.   AROM   Overall AROM Comments Hip range WNL   Thoracic Flexion 15   Thoracic Extension 15   Thoracic - Right Side Bend 10   Thoracic - Left Side Bend 15   Thoracic - Right Rotation 25   Thoracic - Left Rotation 35   Strength   Overall Strength Comments Normal both UE                            PT Short Term Goals - 06/18/14 1641    PT SHORT TERM GOAL #1   Title He will be independent with inital HEP   Time 4   Period Weeks   Status New   PT SHORT TERM GOAL #2   Title He will report a general decreased in thoracic pain of 50%.    Time 4   Period Weeks   Status New   PT SHORT TERM GOAL #3   Title He will report 50% or greater decrease in episodes of sharp back and chest pain.   Time 4   Period Weeks   Status New           PT Long Term Goals - 06/18/14 1643    PT LONG TERM GOAL #1   Title independent with all HEP issued as of last visit   Time 4   Period Weeks   Status New   PT LONG TERM GOAL #2   Title As per STG   PT LONG TERM GOAL #3   Title As per STG   PT  LONG TERM GOAL #4   Title --                 G-Codes -  06/18/14 1505    Functional Assessment Tool Used FOTO   Functional Limitation Changing and maintaining body position   Changing and Maintaining Body Position Current Status [phone]) At least 40 percent but less than 60 percent impaired, limited or restricted   Changing and Maintaining Body Position Goal Status (E7035) At least 20 percent but less than 40 percent impaired, limited or restricted       Problem List Patient Active Problem List   Diagnosis Date Noted  . Acute back pain 05/08/2014  . Angiodysplasia of small intestine, except duodenum with bleeding 02/13/2014  . Acute GI bleeding 02/12/2014  . Acute blood loss anemia 02/12/2014  . Skin lesion of face 12/31/2013  . Enlarged salivary gland 12/31/2013  . Right shoulder pain 06/27/2013  . Rash and nonspecific skin eruption 06/27/2013  . Erectile dysfunction 09/13/2012  . Acute sinus infection 08/22/2012  . Allergic rhinitis, cause unspecified 08/22/2012  . GI AVM (gastrointestinal arteriovenous vascular malformation) 04/28/2012  . Stricture and stenosis of esophagus 04/28/2012  . GI bleed 04/26/2012  . Non-STEMI (non-ST elevated myocardial infarction) 05/24/2011  . COPD (chronic obstructive pulmonary disease) 09/08/2010  . Hypothyroid 08/21/2010  . Preventative health care 08/21/2010  . GASTRITIS 07/21/2010  . ANXIETY 06/13/2010  . NEPHROLITHIASIS, HX OF 01/11/2009  . CAD 12/05/2008  . ATRIAL FLUTTER, CHRONIC 12/05/2008  . Sinus node dysfunction 12/05/2008  . HYPERLIPIDEMIA 09/19/2008  . GOUT 09/19/2008  . Iron deficiency anemia 09/19/2008  . Essential hypertension 09/19/2008  . AAA 09/19/2008  . PERIPHERAL VASCULAR DISEASE 09/19/2008  . GERD 09/19/2008  . DIVERTICULOSIS, COLON 09/19/2008  . CKD (chronic kidney disease) 09/19/2008  . LOW BACK PAIN 09/19/2008  . PSA, INCREASED 09/19/2008  . COLONIC POLYPS, HX OF 09/19/2008  . Atrial fibrillation  04/19/2008  . GASTROINTESTINAL HEMORRHAGE, HX OF 04/19/2008    Darrel Hoover PT 06/18/2014, 4:52 PM  [facility] [address] [place], [place], [address] Phone: [phone]   Fax:  [phone]

## 2014-06-20 ENCOUNTER — Other Ambulatory Visit: Payer: Self-pay

## 2014-06-20 MED ORDER — LEVOTHYROXINE SODIUM 125 MCG PO TABS: 125.0000 ug | ORAL_TABLET | Freq: Every day | ORAL | Status: DC

## 2014-06-25 ENCOUNTER — Ambulatory Visit: Payer: Medicare Other

## 2014-06-25 DIAGNOSIS — R293 Abnormal posture: Secondary | ICD-10-CM

## 2014-06-25 DIAGNOSIS — M546 Pain in thoracic spine: Secondary | ICD-10-CM | POA: Diagnosis not present

## 2014-06-25 DIAGNOSIS — M2560 Stiffness of unspecified joint, not elsewhere classified: Secondary | ICD-10-CM | POA: Diagnosis not present

## 2014-06-25 NOTE — Therapy (Signed)
[place], [place], [address] Phone: [phone]   Fax:  [phone]  Physical Therapy Treatment  Patient Details  Name: Omar Benson MRN: [id number] Date of Birth: 1934-12-08 Referring Provider:  Biagio Borg, MD  Encounter Date: 06/25/2014      PT End of Session - 06/25/14 1312    Visit Number 2   Number of Visits 12   Date for PT Re-Evaluation 07/31/14   PT Start Time 1234   PT Stop Time 1312   PT Time Calculation (min) 38 min   Activity Tolerance Patient tolerated treatment well  Stated pain improved   Behavior During Therapy [facility] for tasks assessed/performed      Past Medical History  Diagnosis Date  . Hypothyroidism 12/05/2008  . HYPERLIPIDEMIA 09/19/2008  . GOUT 09/19/2008  . ANEMIA-IRON DEFICIENCY 09/19/2008  . HYPERTENSION 09/19/2008  . CAD 12/05/2008    s/p CABG; NSTEMI 05/2011 - LHC 05/25/11: LAD occluded, LIMA-LAD patent, ostial circumflex 50%, AV circumflex 70%, SVG-OM2 with extensive disease with thrombus (culprit vessel), RCA 80% and occluded, SVG-intermediate patent, SVG-PDA/PL A patent.  Given that the culprit vessel was a subtotally occluded heavily thrombotic graft to a smaller OM2, medical therapy was recommended.;   . Atrial fibrillation 04/19/2008    amiodarone rx;  Echocardiogram 05/26/11: No wall motion abnormalities, mild LVH, EF 60%.  . Atrial flutter 12/05/2008    s/p RFCA  . BRADYCARDIA 12/05/2008  . AAA 09/19/2008  . PERIPHERAL VASCULAR DISEASE 09/19/2008  . GERD 09/19/2008  . DIVERTICULOSIS, COLON 09/19/2008  . RENAL INSUFFICIENCY 09/19/2008  . LOW BACK PAIN 09/19/2008  . PSA, INCREASED 09/19/2008  . COLONIC POLYPS, HX OF 09/19/2008    adenomatous polyps 07/2010  . GASTROINTESTINAL HEMORRHAGE, HX OF 04/19/2008  . NEPHROLITHIASIS, HX OF 01/11/2009  . CORONARY ARTERY BYPASS GRAFT, HX OF 12/05/2008    A. LIMA-LAD, VG-RI, VG-OM2, VG-RPD/RPL;  B. 05/2011 - NSTEMI - CATH WITH 4/5 PATENT GRAFTS AND NEW  THROMBUS IN DISTAL VG-OM2 - MED RX  . ANXIETY 06/13/2010  . Arthritis   . COPD (chronic obstructive pulmonary disease)   . Myocardial infarction   . Allergy   . Benign esophageal stricture 09/2075    dilated during EGD  . Erectile dysfunction 09/13/2012  . CKD (chronic kidney disease) 09/19/2008    Qualifier: Diagnosis of  By: Jenny Reichmann MD, Hunt Oris     Past Surgical History  Procedure Laterality Date  . Aorto-femoral bypass x5 - stress test neg 9/08    . Coronary artery bypass graft    . S/p afib ablation  2004  . Cardiac catheterization  05/25/2011  . Colon surgery    . Esophagogastroduodenoscopy  04/28/2012    Procedure: ESOPHAGOGASTRODUODENOSCOPY (EGD);  Surgeon: [name], MD;  Location: [place];  Service: Endoscopy;  Laterality: N/A;  . Enteroscopy N/A 02/13/2014    Procedure: ENTEROSCOPY;  Surgeon: [name], MD;  Location: [place];  Service: Gastroenterology;  Laterality: N/A;  super slim  . Left heart catheterization with coronary/graft angiogram  05/25/2011    Procedure: LEFT HEART CATHETERIZATION WITH [name];  Surgeon: [name], MD;  Location: [facility] CATH LAB;  Service: Cardiovascular;;    There were no vitals taken for this visit.  Visit Diagnosis:  Joint stiffness of spine  Pain in thoracic spine  Abnormal posture      Subjective Assessment - 06/25/14 1232    Symptoms Improved with exercise and foam roller. Still between  scapula. 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      Pertinent History He began to have knife feeling in to back to front and along ribs between scapula.    Patient Stated Goals Decrease pain     Currently in Pain? Yes   Pain Score 1    Multiple Pain Sites No                    [facility] Adult PT Treatment/Exercise - 06/25/14 1310    Modalities   Modalities Ultrasound   Ultrasound   Ultrasound Location thoracic spine   Ultrasound Parameters 100% 1 MHZ, 1.7Wcm2   Ultrasound Goals Pain   Manual Therapy    Manual Therapy Joint mobilization;Massage   Joint Mobilization PA glides Gr 3-4 to thoracic spine and lumbar spine  20-40 reps   Massage STe wot thoracic spine most in upper thoracic                  PT Short Term Goals - 06/18/14 1641    PT SHORT TERM GOAL #1   Title He will be independent with inital HEP   Time 4   Period Weeks   Status New   PT SHORT TERM GOAL #2   Title He will report a general decreased in thoracic pain of 50%.    Time 4   Period Weeks   Status New   PT SHORT TERM GOAL #3   Title He will report 50% or greater decrease in episodes of sharp back and chest pain.   Time 4   Period Weeks   Status New           PT Long Term Goals - 06/18/14 1643    PT LONG TERM GOAL #1   Title independent with all HEP issued as of last visit   Time 4   Period Weeks   Status New   PT LONG TERM GOAL #2   Title As per STG   PT LONG TERM GOAL #3   Title As per STG   PT LONG TERM GOAL #4   Title --               Plan - 06/25/14 1313    Clinical Impression Statement Benefited from manual and [place] so will contniue same . May give some thoracic rotation stretches in quadraped   Pt will benefit from skilled therapeutic intervention in order to improve on the following deficits Pain;Decreased range of motion   Rehab Potential Good   PT Frequency 2x / week   PT Duration 4 weeks   PT Treatment/Interventions Electrical Stimulation;Moist Heat;Cryotherapy;Ultrasound;Therapeutic exercise;Patient/family education;Manual techniques;Passive range of motion;Dry needling   PT Next Visit Plan Continue mobs and [place]   thoracic rotation stretches   PT Home Exercise Plan Rotation stretches next   Consulted and Agree with Plan of Care Patient        Problem List Patient Active Problem List   Diagnosis Date Noted  . Acute back pain 05/08/2014  . Angiodysplasia of small intestine, except duodenum with bleeding 02/13/2014  . Acute GI bleeding 02/12/2014  . Acute blood  loss anemia 02/12/2014  . Skin lesion of face 12/31/2013  . Enlarged salivary gland 12/31/2013  . Right shoulder pain 06/27/2013  . Rash and nonspecific skin eruption 06/27/2013  . Erectile dysfunction 09/13/2012  . Acute sinus infection 08/22/2012  . Allergic rhinitis, cause unspecified 08/22/2012  . GI AVM (gastrointestinal arteriovenous vascular malformation) 04/28/2012  .  Stricture and stenosis of esophagus 04/28/2012  . GI bleed 04/26/2012  . Non-STEMI (non-ST elevated myocardial infarction) 05/24/2011  . COPD (chronic obstructive pulmonary disease) 09/08/2010  . Hypothyroid 08/21/2010  . Preventative health care 08/21/2010  . GASTRITIS 07/21/2010  . ANXIETY 06/13/2010  . NEPHROLITHIASIS, HX OF 01/11/2009  . CAD 12/05/2008  . ATRIAL FLUTTER, CHRONIC 12/05/2008  . Sinus node dysfunction 12/05/2008  . HYPERLIPIDEMIA 09/19/2008  . GOUT 09/19/2008  . Iron deficiency anemia 09/19/2008  . Essential hypertension 09/19/2008  . AAA 09/19/2008  . PERIPHERAL VASCULAR DISEASE 09/19/2008  . GERD 09/19/2008  . DIVERTICULOSIS, COLON 09/19/2008  . CKD (chronic kidney disease) 09/19/2008  . LOW BACK PAIN 09/19/2008  . PSA, INCREASED 09/19/2008  . COLONIC POLYPS, HX OF 09/19/2008  . Atrial fibrillation 04/19/2008  . GASTROINTESTINAL HEMORRHAGE, HX OF 04/19/2008    Darrel Hoover PT 06/25/2014, 1:16 PM  [facility] [address] [place], [place], [address] Phone: [phone]   Fax:  [phone]

## 2014-06-27 ENCOUNTER — Encounter: Payer: Self-pay | Admitting: Internal Medicine

## 2014-06-27 ENCOUNTER — Ambulatory Visit (INDEPENDENT_AMBULATORY_CARE_PROVIDER_SITE_OTHER): Payer: Medicare Other | Admitting: Internal Medicine

## 2014-06-27 VITALS — BP 134/84 | HR 69 | Temp 97.5°F | Ht 69.0 in | Wt 184.0 lb

## 2014-06-27 DIAGNOSIS — J449 Chronic obstructive pulmonary disease, unspecified: Secondary | ICD-10-CM

## 2014-06-27 DIAGNOSIS — I10 Essential (primary) hypertension: Secondary | ICD-10-CM

## 2014-06-27 DIAGNOSIS — Z87891 Personal history of nicotine dependence: Secondary | ICD-10-CM

## 2014-06-27 DIAGNOSIS — E038 Other specified hypothyroidism: Secondary | ICD-10-CM

## 2014-06-27 MED ORDER — PANTOPRAZOLE SODIUM 40 MG PO TBEC: 40.0000 mg | DELAYED_RELEASE_TABLET | Freq: Every day | ORAL | Status: DC

## 2014-06-27 MED ORDER — ALLOPURINOL 300 MG PO TABS: 300.0000 mg | ORAL_TABLET | Freq: Every day | ORAL | Status: DC

## 2014-06-27 MED ORDER — VARDENAFIL HCL 20 MG PO TABS: 20.0000 mg | ORAL_TABLET | ORAL | Status: DC | PRN

## 2014-06-27 MED ORDER — LEVOTHYROXINE SODIUM 125 MCG PO TABS: 125.0000 ug | ORAL_TABLET | Freq: Every day | ORAL | Status: DC

## 2014-06-27 MED ORDER — TIOTROPIUM BROMIDE MONOHYDRATE 18 MCG IN CAPS: ORAL_CAPSULE | RESPIRATORY_TRACT | Status: DC

## 2014-06-27 MED ORDER — NITROGLYCERIN 0.4 MG SL SUBL: 0.4000 mg | SUBLINGUAL_TABLET | SUBLINGUAL | Status: DC | PRN

## 2014-06-27 NOTE — Progress Notes (Signed)
Subjective:    Patient ID: Omar Benson, male    DOB: 1934-07-18, 80 y.o.   MRN: [id number]  HPI   Here to f/u; overall doing ok,  Pt denies chest pain, increased sob or doe, wheezing, orthopnea, PND, increased LE swelling, palpitations, dizziness or syncope.  Pt denies polydipsia, polyuria, or low sugar symptoms such as weakness or confusion improved with po intake.  Pt denies new neurological symptoms such as new headache, or facial or extremity weakness or numbness.   Pt states overall good compliance with meds, has been trying to follow lower cholesterol diet, with wt overall stable. Denies hyper or hypo thyroid symptoms such as voice, skin or hair change. Drinks 3-4 oz liqour per day.  Former smoker 3-4 ppd for 31 yrs, quit at 80yo. For start low dose CT lung Ca screening;  Per CMS guidelines, I have determined the eligibility including patient age (37-80), and absence of any signs of lung cancer.  Specific calculation of number of pack years is documented.  Quit smoking years is documented.  Shared decision making engaged today, including a discussion of the benefits and harms of screening, discussion of need for followup with additional testing, risks of over-diagnosis, risk of false-positive screening examinations, and risk of radiation exposure.  Counseling done today on the importance of adherence to annual lunge cancer LDCT screening, importance of quit smoking and remaining quit, and tobacco cessation instructions given.  There is also discussion with patient regarding the impact of comorbidities, and patient states is able and willing to undergo diagnosis and treatment. Past Medical History  Diagnosis Date  . Hypothyroidism 12/05/2008  . HYPERLIPIDEMIA 09/19/2008  . GOUT 09/19/2008  . ANEMIA-IRON DEFICIENCY 09/19/2008  . HYPERTENSION 09/19/2008  . CAD 12/05/2008    s/p CABG; NSTEMI 05/2011 - LHC 05/25/11: LAD occluded, LIMA-LAD patent, ostial circumflex 50%, AV circumflex 70%, SVG-OM2 with  extensive disease with thrombus (culprit vessel), RCA 80% and occluded, SVG-intermediate patent, SVG-PDA/PL A patent.  Given that the culprit vessel was a subtotally occluded heavily thrombotic graft to a smaller OM2, medical therapy was recommended.;   . Atrial fibrillation 04/19/2008    amiodarone rx;  Echocardiogram 05/26/11: No wall motion abnormalities, mild LVH, EF 60%.  . Atrial flutter 12/05/2008    s/p RFCA  . BRADYCARDIA 12/05/2008  . AAA 09/19/2008  . PERIPHERAL VASCULAR DISEASE 09/19/2008  . GERD 09/19/2008  . DIVERTICULOSIS, COLON 09/19/2008  . RENAL INSUFFICIENCY 09/19/2008  . LOW BACK PAIN 09/19/2008  . PSA, INCREASED 09/19/2008  . COLONIC POLYPS, HX OF 09/19/2008    adenomatous polyps 07/2010  . GASTROINTESTINAL HEMORRHAGE, HX OF 04/19/2008  . NEPHROLITHIASIS, HX OF 01/11/2009  . CORONARY ARTERY BYPASS GRAFT, HX OF 12/05/2008    A. LIMA-LAD, VG-RI, VG-OM2, VG-RPD/RPL;  B. 05/2011 - NSTEMI - CATH WITH 4/5 PATENT GRAFTS AND NEW THROMBUS IN DISTAL VG-OM2 - MED RX  . ANXIETY 06/13/2010  . Arthritis   . COPD (chronic obstructive pulmonary disease)   . Myocardial infarction   . Allergy   . Benign esophageal stricture 09/2075    dilated during EGD  . Erectile dysfunction 09/13/2012  . CKD (chronic kidney disease) 09/19/2008    Qualifier: Diagnosis of  By: Jenny Reichmann MD, Hunt Oris    Past Surgical History  Procedure Laterality Date  . Aorto-femoral bypass x5 - stress test neg 9/08    . Coronary artery bypass graft    . S/p afib ablation  2004  . Cardiac catheterization  05/25/2011  .  Colon surgery    . Esophagogastroduodenoscopy  04/28/2012    Procedure: ESOPHAGOGASTRODUODENOSCOPY (EGD);  Surgeon: [name], MD;  Location: [place];  Service: Endoscopy;  Laterality: N/A;  . Enteroscopy N/A 02/13/2014    Procedure: ENTEROSCOPY;  Surgeon: [name], MD;  Location: [place];  Service: Gastroenterology;  Laterality: N/A;  super slim  . Left heart catheterization with coronary/graft angiogram   05/25/2011    Procedure: LEFT HEART CATHETERIZATION WITH [name];  Surgeon: [name], MD;  Location: [facility] CATH LAB;  Service: Cardiovascular;;    reports that he quit smoking about 31 years ago. His smoking use included Cigarettes. He has a 124 pack-year smoking history. He has never used smokeless tobacco. He reports that he drinks about 6.0 oz of alcohol per week. He reports that he does not use illicit drugs. family history includes Cancer in his mother; Diabetes in his father and sister; Heart disease in his maternal uncle and paternal uncle; Lymphoma in his sister; Multiple myeloma in his mother; Other in his mother. Allergies  Allergen Reactions  . Bee Venom Anaphylaxis    Only yellow jackets   . Promethazine Hcl Other (See Comments)    syncope  . Doxycycline Hyclate Other (See Comments)    esophagitis  . Lipitor [Atorvastatin Calcium] Other (See Comments)    Myalgia/myopathy  . Other Diarrhea and Nausea And Vomiting    Seafood  . Tetracycline Other (See Comments)    Esophagitis  . Crestor [Rosuvastatin] Other (See Comments)    Myalgia/myopathy  . Tetanus Toxoid Rash   Current Outpatient Prescriptions on File Prior to Visit  Medication Sig Dispense Refill  . amiodarone (PACERONE) 100 MG tablet Take 1 tablet (100 mg total) by mouth daily. 90 tablet 3  . aspirin EC 81 MG tablet Take 1 tablet (81 mg total) by mouth daily.    . calcium elemental as carbonate (TUMS ULTRA 1000) 400 MG tablet Chew 1,000 mg by mouth daily as needed for heartburn.    Marland Kitchen EPINEPHrine (EPIPEN) 0.3 mg/0.3 mL SOAJ injection Inject 0.3 mLs (0.3 mg total) into the muscle once. 2 Device 2  . Fe Fum-FePoly-Vit C-Vit B3 (INTEGRA) 62.5-62.5-40-3 MG CAPS Take 1 capsule by mouth daily. 30 capsule 3  . hydrocortisone cream 0.5 % Apply 1 application topically daily.     . Multiple Vitamin (MULTIVITAMIN) tablet Take 1 tablet by mouth at bedtime.      No current facility-administered medications on  file prior to visit.   Review of Systems  Constitutional: Negative for unusual diaphoresis or other sweats  HENT: Negative for ringing in ear Eyes: Negative for double vision or worsening visual disturbance.  Respiratory: Negative for choking and stridor.   Gastrointestinal: Negative for vomiting or other signifcant bowel change Genitourinary: Negative for hematuria or decreased urine volume.  Musculoskeletal: Negative for other MSK pain or swelling Skin: Negative for color change and worsening wound.  Neurological: Negative for tremors and numbness other than noted  Psychiatric/Behavioral: Negative for decreased concentration or agitation other than above       Objective:   Physical Exam BP 134/84 mmHg  Pulse 69  Temp(Src) 97.5 F (36.4 C) (Oral)  Ht $R'5\' 9"'jU$  (1.753 m)  Wt 184 lb (83.462 kg)  BMI 27.16 kg/m2 VS noted,  Constitutional: Pt appears well-developed, well-nourished.  HENT: Head: NCAT.  Right Ear: External ear normal.  Left Ear: External ear normal.  Eyes: . Pupils are equal, round, and reactive to light. Conjunctivae and EOM  are normal Neck: Normal range of motion. Neck supple.  Cardiovascular: Normal rate and regular rhythm.   Pulmonary/Chest: Effort normal and breath sounds without rales or wheezing.  Neurological: Pt is alert. Not confused , motor grossly intact Skin: Skin is warm. No rash Psychiatric: Pt behavior is normal. No agitation.     Assessment & Plan:

## 2014-06-27 NOTE — Patient Instructions (Signed)
Please continue all other medications as before, and refills have been done if requested - see hardcopies  Please have the pharmacy call with any other refills you may need.  Please continue your efforts at being more active, low cholesterol diet, and weight control.  Please keep your appointments with your specialists as you may have planned  You will be contacted regarding the referral for: CT scan for lung cancer screening  Please return in 6 months, or sooner if needed

## 2014-06-27 NOTE — Progress Notes (Signed)
Pre visit review using our clinic review tool, if applicable. No additional management support is needed unless otherwise documented below in the visit note. 

## 2014-06-28 ENCOUNTER — Ambulatory Visit: Payer: Medicare Other

## 2014-06-30 DIAGNOSIS — Z87891 Personal history of nicotine dependence: Secondary | ICD-10-CM | POA: Insufficient documentation

## 2014-06-30 NOTE — Assessment & Plan Note (Signed)
.  stable overall by history and exam, recent data reviewed with pt, and pt to continue medical treatment as before,  to f/u any worsening symptoms or concerns SpO2 Readings from Last 3 Encounters:  05/31/14 95%  05/08/14 97%  04/27/14 99%

## 2014-06-30 NOTE — Assessment & Plan Note (Signed)
stable overall by history and exam, recent data reviewed with pt, and pt to continue medical treatment as before,  to f/u any worsening symptoms or concerns BP Readings from Last 3 Encounters:  06/27/14 134/84  05/31/14 124/72  05/08/14 128/82

## 2014-06-30 NOTE — Assessment & Plan Note (Signed)
Qualifies for lung ca screening - for LDCT chest - screen malignancy

## 2014-06-30 NOTE — Assessment & Plan Note (Signed)
stable overall by history and exam, recent data reviewed with pt, and pt to continue medical treatment as before,  to f/u any worsening symptoms or concerns Lab Results  Component Value Date   TSH 1.58 03/13/2014

## 2014-07-02 ENCOUNTER — Ambulatory Visit: Payer: Commercial Managed Care - HMO | Admitting: Family Medicine

## 2014-07-03 ENCOUNTER — Ambulatory Visit: Payer: Medicare Other | Admitting: Physical Therapy

## 2014-07-03 DIAGNOSIS — M2560 Stiffness of unspecified joint, not elsewhere classified: Secondary | ICD-10-CM

## 2014-07-03 DIAGNOSIS — M546 Pain in thoracic spine: Secondary | ICD-10-CM | POA: Diagnosis not present

## 2014-07-03 DIAGNOSIS — R293 Abnormal posture: Secondary | ICD-10-CM

## 2014-07-03 NOTE — Therapy (Signed)
[place], [place], [address] Phone: [phone]   Fax:  [phone]  Physical Therapy Treatment  Patient Details  Name: Omar Benson MRN: [id number] Date of Birth: [birth year] Referring Provider:  Biagio Borg, MD  Encounter Date: 07/03/2014      PT End of Session - 07/03/14 1524    Visit Number 3   Number of Visits 12   Date for PT Re-Evaluation 07/31/14   PT Start Time 1330   PT Stop Time 1416   PT Time Calculation (min) 46 min   Activity Tolerance Patient tolerated treatment well      Past Medical History  Diagnosis Date  . Hypothyroidism 12/05/2008  . HYPERLIPIDEMIA 09/19/2008  . GOUT 09/19/2008  . ANEMIA-IRON DEFICIENCY 09/19/2008  . HYPERTENSION 09/19/2008  . CAD 12/05/2008    s/p CABG; NSTEMI 05/2011 - LHC 05/25/11: LAD occluded, LIMA-LAD patent, ostial circumflex 50%, AV circumflex 70%, SVG-OM2 with extensive disease with thrombus (culprit vessel), RCA 80% and occluded, SVG-intermediate patent, SVG-PDA/PL A patent.  Given that the culprit vessel was a subtotally occluded heavily thrombotic graft to a smaller OM2, medical therapy was recommended.;   . Atrial fibrillation 04/19/2008    amiodarone rx;  Echocardiogram 05/26/11: No wall motion abnormalities, mild LVH, EF 60%.  . Atrial flutter 12/05/2008    s/p RFCA  . BRADYCARDIA 12/05/2008  . AAA 09/19/2008  . PERIPHERAL VASCULAR DISEASE 09/19/2008  . GERD 09/19/2008  . DIVERTICULOSIS, COLON 09/19/2008  . RENAL INSUFFICIENCY 09/19/2008  . LOW BACK PAIN 09/19/2008  . PSA, INCREASED 09/19/2008  . COLONIC POLYPS, HX OF 09/19/2008    adenomatous polyps 07/2010  . GASTROINTESTINAL HEMORRHAGE, HX OF 04/19/2008  . NEPHROLITHIASIS, HX OF 01/11/2009  . CORONARY ARTERY BYPASS GRAFT, HX OF 12/05/2008    A. LIMA-LAD, VG-RI, VG-OM2, VG-RPD/RPL;  B. 05/2011 - NSTEMI - CATH WITH 4/5 PATENT GRAFTS AND NEW THROMBUS IN DISTAL VG-OM2 - MED RX  . ANXIETY 06/13/2010  . Arthritis   . COPD  (chronic obstructive pulmonary disease)   . Myocardial infarction   . Allergy   . Benign esophageal stricture 09/2075    dilated during EGD  . Erectile dysfunction 09/13/2012  . CKD (chronic kidney disease) 09/19/2008    Qualifier: Diagnosis of  By: Jenny Reichmann MD, Hunt Oris     Past Surgical History  Procedure Laterality Date  . Aorto-femoral bypass x5 - stress test neg 9/08    . Coronary artery bypass graft    . S/p afib ablation  2004  . Cardiac catheterization  05/25/2011  . Colon surgery    . Esophagogastroduodenoscopy  04/28/2012    Procedure: ESOPHAGOGASTRODUODENOSCOPY (EGD);  Surgeon: [name], MD;  Location: [facility];  Service: Endoscopy;  Laterality: N/A;  . Enteroscopy N/A 02/13/2014    Procedure: ENTEROSCOPY;  Surgeon: [name], MD;  Location: [facility];  Service: Gastroenterology;  Laterality: N/A;  super slim  . Left heart catheterization with coronary/graft angiogram  05/25/2011    Procedure: LEFT HEART CATHETERIZATION WITH [name];  Surgeon: [name], MD;  Location: [facility] CATH LAB;  Service: Cardiovascular;;    There were no vitals taken for this visit.  Visit Diagnosis:  Joint stiffness of spine  Pain in thoracic spine  Abnormal posture      Subjective Assessment - 07/03/14 1338    Symptoms Low back hurts 1/10  thoracic present, but not pain  Doing his home exercises. No knife pain in back.Marland Kitchen  Pain improving.                     [facility] Adult PT Treatment/Exercise - 07/03/14 1330    Exercises   Exercises --  thoracic rotation, arm opener added to home 10 reps    Ultrasound   Ultrasound Location Thoracic   Ultrasound Parameters 100% 1.7, 8 minutes   Ultrasound Goals Pain   Manual Therapy   Manual Therapy --  neuromusculat trigger point release, para thoracic, teres, S                PT Education - 07/03/14 1523    Education provided Yes   Education Details Arm opener pilates level 1   Methods  Explanation;Tactile cues;Handout   Comprehension Verbalized understanding;Returned demonstration          PT Short Term Goals - 07/03/14 1527    PT SHORT TERM GOAL #1   Title He will be independent with inital HEP   Time 4   Period Weeks   Status Achieved   PT SHORT TERM GOAL #2   Title He will report a general decreased in thoracic pain of 50%.    Time 4   Period Weeks   Status On-going   PT SHORT TERM GOAL #3   Title He will report 50% or greater decrease in episodes of sharp back and chest pain.   Time 4   Period Weeks   Status Achieved           PT Long Term Goals - 07/03/14 1527    PT LONG TERM GOAL #1   Time 4   Period Weeks   Status On-going               Plan - 07/03/14 1525    Clinical Impression Statement Thoracic stretche review  manual helpful, will need a PT to do mobilization today, soft tissue work only.   PT Next Visit Plan Continue mobs and [place]   thoracic rotation stretches Quadriped thoracic stretch? in child's pose?        Problem List Patient Active Problem List   Diagnosis Date Noted  . Former smoker 06/30/2014  . Acute back pain 05/08/2014  . Angiodysplasia of small intestine, except duodenum with bleeding 02/13/2014  . Acute GI bleeding 02/12/2014  . Acute blood loss anemia 02/12/2014  . Skin lesion of face 12/31/2013  . Enlarged salivary gland 12/31/2013  . Right shoulder pain 06/27/2013  . Rash and nonspecific skin eruption 06/27/2013  . Erectile dysfunction 09/13/2012  . Acute sinus infection 08/22/2012  . Allergic rhinitis, cause unspecified 08/22/2012  . GI AVM (gastrointestinal arteriovenous vascular malformation) 04/28/2012  . Stricture and stenosis of esophagus 04/28/2012  . GI bleed 04/26/2012  . Non-STEMI (non-ST elevated myocardial infarction) 05/24/2011  . COPD (chronic obstructive pulmonary disease) 09/08/2010  . Hypothyroid 08/21/2010  . Preventative health care 08/21/2010  . GASTRITIS 07/21/2010  . ANXIETY  06/13/2010  . NEPHROLITHIASIS, HX OF 01/11/2009  . CAD 12/05/2008  . ATRIAL FLUTTER, CHRONIC 12/05/2008  . Sinus node dysfunction 12/05/2008  . HYPERLIPIDEMIA 09/19/2008  . GOUT 09/19/2008  . Iron deficiency anemia 09/19/2008  . Essential hypertension 09/19/2008  . AAA 09/19/2008  . PERIPHERAL VASCULAR DISEASE 09/19/2008  . GERD 09/19/2008  . DIVERTICULOSIS, COLON 09/19/2008  . CKD (chronic kidney disease) 09/19/2008  . LOW BACK PAIN 09/19/2008  . PSA, INCREASED 09/19/2008  . COLONIC POLYPS, HX OF 09/19/2008  . Atrial fibrillation 04/19/2008  . GASTROINTESTINAL HEMORRHAGE,  HX OF 04/19/2008   Melvenia Needles, PTA 07/03/2014 3:30 PM Phone: [phone] Fax: [phone]  [facility] 07/03/2014, 3:30 PM  [place], [place], [address] Phone: [phone]   Fax:  [phone]

## 2014-07-05 ENCOUNTER — Ambulatory Visit: Payer: Medicare Other

## 2014-07-05 DIAGNOSIS — M2560 Stiffness of unspecified joint, not elsewhere classified: Secondary | ICD-10-CM

## 2014-07-05 DIAGNOSIS — M546 Pain in thoracic spine: Secondary | ICD-10-CM | POA: Diagnosis not present

## 2014-07-05 NOTE — Therapy (Addendum)
[place], [place], [address] Phone: [phone]   Fax:  [phone]  Physical Therapy Treatment  Patient Details  Name: Omar Benson MRN: [id number] Date of Birth: Jan 29, 1935 Referring Provider:  Biagio Borg, MD  Encounter Date: 07/05/2014      PT End of Session - 07/05/14 1409    PT Start Time 1330   PT Stop Time 1410   PT Time Calculation (min) 40 min   Activity Tolerance Patient tolerated treatment well   Behavior During Therapy [facility] for tasks assessed/performed      Past Medical History  Diagnosis Date  . Hypothyroidism 12/05/2008  . HYPERLIPIDEMIA 09/19/2008  . GOUT 09/19/2008  . ANEMIA-IRON DEFICIENCY 09/19/2008  . HYPERTENSION 09/19/2008  . CAD 12/05/2008    s/p CABG; NSTEMI 05/2011 - LHC 05/25/11: LAD occluded, LIMA-LAD patent, ostial circumflex 50%, AV circumflex 70%, SVG-OM2 with extensive disease with thrombus (culprit vessel), RCA 80% and occluded, SVG-intermediate patent, SVG-PDA/PL A patent.  Given that the culprit vessel was a subtotally occluded heavily thrombotic graft to a smaller OM2, medical therapy was recommended.;   . Atrial fibrillation 04/19/2008    amiodarone rx;  Echocardiogram 05/26/11: No wall motion abnormalities, mild LVH, EF 60%.  . Atrial flutter 12/05/2008    s/p RFCA  . BRADYCARDIA 12/05/2008  . AAA 09/19/2008  . PERIPHERAL VASCULAR DISEASE 09/19/2008  . GERD 09/19/2008  . DIVERTICULOSIS, COLON 09/19/2008  . RENAL INSUFFICIENCY 09/19/2008  . LOW BACK PAIN 09/19/2008  . PSA, INCREASED 09/19/2008  . COLONIC POLYPS, HX OF 09/19/2008    adenomatous polyps 07/2010  . GASTROINTESTINAL HEMORRHAGE, HX OF 04/19/2008  . NEPHROLITHIASIS, HX OF 01/11/2009  . CORONARY ARTERY BYPASS GRAFT, HX OF 12/05/2008    A. LIMA-LAD, VG-RI, VG-OM2, VG-RPD/RPL;  B. 05/2011 - NSTEMI - CATH WITH 4/5 PATENT GRAFTS AND NEW THROMBUS IN DISTAL VG-OM2 - MED RX  . ANXIETY 06/13/2010  . Arthritis   . COPD (chronic obstructive  pulmonary disease)   . Myocardial infarction   . Allergy   . Benign esophageal stricture 09/2075    dilated during EGD  . Erectile dysfunction 09/13/2012  . CKD (chronic kidney disease) 09/19/2008    Qualifier: Diagnosis of  By: Jenny Reichmann MD, Hunt Oris     Past Surgical History  Procedure Laterality Date  . Aorto-femoral bypass x5 - stress test neg 9/08    . Coronary artery bypass graft    . S/p afib ablation  2004  . Cardiac catheterization  05/25/2011  . Colon surgery    . Esophagogastroduodenoscopy  04/28/2012    Procedure: ESOPHAGOGASTRODUODENOSCOPY (EGD);  Surgeon: [name], MD;  Location: [facility];  Service: Endoscopy;  Laterality: N/A;  . Enteroscopy N/A 02/13/2014    Procedure: ENTEROSCOPY;  Surgeon: [name], MD;  Location: [facility];  Service: Gastroenterology;  Laterality: N/A;  super slim  . Left heart catheterization with coronary/graft angiogram  05/25/2011    Procedure: LEFT HEART CATHETERIZATION WITH [name];  Surgeon: [name], MD;  Location: [facility] CATH LAB;  Service: Cardiovascular;;    There were no vitals taken for this visit.  Visit Diagnosis:  Pain in thoracic spine  Joint stiffness of spine      Subjective Assessment - 07/05/14 1338    Symptoms doing better. Lower back pain 1-2/10. Did HEP   Currently in Pain? Yes   Pain Score 2    Pain Location Back   Pain Orientation Posterior   Multiple  Pain Sites No                    [facility] Adult PT Treatment/Exercise - 07/05/14 0001    Exercises   Exercises --  Arm opening done correctly, add locked thor rotation on knee   Ultrasound   Ultrasound Location thoracic   Ultrasound Parameters 100% 1 MHZ 1.7 Wcm2   Ultrasound Goals Pain   Manual Therapy   Joint Mobilization PA glides GR 3-4 toracic and lumbar T4 to L5   Massage STW with trigger point release                  PT Short Term Goals - 07/03/14 1527    PT SHORT TERM GOAL #1   Title He will be  independent with inital HEP   Time 4   Period Weeks   Status Achieved   PT SHORT TERM GOAL #2   Title He will report a general decreased in thoracic pain of 50%.    Time 4   Period Weeks   Status On-going   PT SHORT TERM GOAL #3   Title He will report 50% or greater decrease in episodes of sharp back and chest pain.   Time 4   Period Weeks   Status Achieved           PT Long Term Goals - 07/03/14 1527    PT LONG TERM GOAL #1   Time 4   Period Weeks   Status On-going               Plan - 07/05/14 1409    Clinical Impression Statement Follow up in 2 weeks and decide if we will continue. Thracic improved pain  more lower thoracic upper lumbar now   PT Frequency 2x / week   PT Duration 2 weeks   PT Home Exercise Plan Continjue manual and stretching, modalities   Consulted and Agree with Plan of Care Patient        Problem List Patient Active Problem List   Diagnosis Date Noted  . Former smoker 06/30/2014  . Acute back pain 05/08/2014  . Angiodysplasia of small intestine, except duodenum with bleeding 02/13/2014  . Acute GI bleeding 02/12/2014  . Acute blood loss anemia 02/12/2014  . Skin lesion of face 12/31/2013  . Enlarged salivary gland 12/31/2013  . Right shoulder pain 06/27/2013  . Rash and nonspecific skin eruption 06/27/2013  . Erectile dysfunction 09/13/2012  . Acute sinus infection 08/22/2012  . Allergic rhinitis, cause unspecified 08/22/2012  . GI AVM (gastrointestinal arteriovenous vascular malformation) 04/28/2012  . Stricture and stenosis of esophagus 04/28/2012  . GI bleed 04/26/2012  . Non-STEMI (non-ST elevated myocardial infarction) 05/24/2011  . COPD (chronic obstructive pulmonary disease) 09/08/2010  . Hypothyroid 08/21/2010  . Preventative health care 08/21/2010  . GASTRITIS 07/21/2010  . ANXIETY 06/13/2010  . NEPHROLITHIASIS, HX OF 01/11/2009  . CAD 12/05/2008  . ATRIAL FLUTTER, CHRONIC 12/05/2008  . Sinus node dysfunction  12/05/2008  . HYPERLIPIDEMIA 09/19/2008  . GOUT 09/19/2008  . Iron deficiency anemia 09/19/2008  . Essential hypertension 09/19/2008  . AAA 09/19/2008  . PERIPHERAL VASCULAR DISEASE 09/19/2008  . GERD 09/19/2008  . DIVERTICULOSIS, COLON 09/19/2008  . CKD (chronic kidney disease) 09/19/2008  . LOW BACK PAIN 09/19/2008  . PSA, INCREASED 09/19/2008  . COLONIC POLYPS, HX OF 09/19/2008  . Atrial fibrillation 04/19/2008  . GASTROINTESTINAL HEMORRHAGE, HX OF 04/19/2008    Darrel Hoover PT 07/05/2014, 2:11 PM  [place], [place], [address] Phone: [phone]   Fax:  [phone]     PHYSICAL THERAPY DISCHARGE SUMMARY  Visits from Start of Care: 3  Current functional level related to goals / functional outcomes: He was doing better with pain level 1-2/10. We agreed that he would wait 2 weeks and see if he worsened  But he did not return   Remaining deficits: Minor pain more in thoracic spine   Education / Equipment: HEP  Plan: Patient agrees to discharge.  Patient goals were partially met. Patient is being discharged due to not returning since the last visit.  ?????   Pearson Forster PT                    08/02/14    1:42PM

## 2014-07-20 ENCOUNTER — Ambulatory Visit: Payer: Medicare Other

## 2014-08-06 ENCOUNTER — Other Ambulatory Visit: Payer: Self-pay | Admitting: Internal Medicine

## 2014-09-12 ENCOUNTER — Encounter: Payer: Self-pay | Admitting: Pulmonary Disease

## 2014-09-12 ENCOUNTER — Ambulatory Visit (INDEPENDENT_AMBULATORY_CARE_PROVIDER_SITE_OTHER): Payer: Medicare Other | Admitting: Pulmonary Disease

## 2014-09-12 VITALS — BP 130/80 | HR 62 | Temp 97.9°F | Ht 69.0 in | Wt 185.8 lb

## 2014-09-12 DIAGNOSIS — J438 Other emphysema: Secondary | ICD-10-CM

## 2014-09-12 NOTE — Progress Notes (Signed)
   Subjective:    Patient ID: Omar Benson, male    DOB: June 05, 1934, 80 y.o.   MRN: [id number]  HPI The patient comes in today for follow-up of his mild COPD. He feels that his breathing is not as good as last year, but has had a lot of other medical issues to deal with. Last fall he had an issue with GI bleeding and anemia, and it took him quite a while to get back to baseline. He feels that he has not been as active as usual, and thinks this can is contributing as well. He is staying on Spiriva, and is not requiring his rescue inhaler. He has no significant cough or mucus production, but is having issues with postnasal drip this spring.   Review of Systems  Constitutional: Negative for fever and unexpected weight change.  HENT: Positive for congestion and postnasal drip. Negative for dental problem, ear pain, nosebleeds, rhinorrhea, sinus pressure, sneezing, sore throat and trouble swallowing.   Eyes: Negative for redness and itching.  Respiratory: Positive for shortness of breath. Negative for cough, chest tightness and wheezing.   Cardiovascular: Negative for palpitations and leg swelling.  Gastrointestinal: Negative for nausea and vomiting.  Genitourinary: Negative for dysuria.  Musculoskeletal: Negative for joint swelling.  Skin: Negative for rash.  Neurological: Negative for headaches.  Hematological: Does not bruise/bleed easily.  Psychiatric/Behavioral: Negative for dysphoric mood. The patient is not nervous/anxious.        Objective:   Physical Exam Well-developed male in no acute distress Nose without purulence or discharge noted Neck without lymphadenopathy or thyromegaly Chest totally clear to auscultation, no wheezing Cardiac exam with regular rate and rhythm Lower extremities without edema, no cyanosis Alert and oriented, moves all 4 extremities.       Assessment & Plan:

## 2014-09-12 NOTE — Patient Instructions (Addendum)
Try zyrtec 10mg  one at bedtime for your allergy symptoms (do not get the D version) No change in your breathing medications Work on conditioning and weight reduction. If you are not seeing an improvement in your breathing, let us know followup in one year.

## 2014-09-12 NOTE — Assessment & Plan Note (Addendum)
The patient feels that his breathing has gotten a little worse over the last 6 months, but has been having a lot of health issues, including GI bleeding with anemia.  His last hemoglobin was 10, but has not been checked in a while.  He admits that he has been rather sedentary, and would like to work on his conditioning to see if things get better. His spirometry today shows some decline in his FEV1, but it has been over 4 years. His ratio is actually normal. This raises the question whether his prior abnormality was related to asthma rather than emphysema. We would certainly consider an inhaled steroid to be a part of his regimen if this is indeed asthma, but he has done so well in the past on Spiriva alone. I have asked him to work on his conditioning and weight reduction, and if his breathing is not returning to baseline, he is to let us know. As he will follow-up in one year.

## 2014-11-21 ENCOUNTER — Encounter: Payer: Self-pay | Admitting: Gastroenterology

## 2015-02-11 ENCOUNTER — Encounter (HOSPITAL_COMMUNITY): Payer: Self-pay | Admitting: *Deleted

## 2015-02-11 ENCOUNTER — Ambulatory Visit (INDEPENDENT_AMBULATORY_CARE_PROVIDER_SITE_OTHER): Payer: Medicare Other | Admitting: Family Medicine

## 2015-02-11 ENCOUNTER — Emergency Department (HOSPITAL_COMMUNITY)
Admission: EM | Admit: 2015-02-11 | Discharge: 2015-02-12 | Disposition: A | Discharge status: Home / Self Care | Payer: Medicare Other | Attending: Emergency Medicine | Admitting: Emergency Medicine

## 2015-02-11 VITALS — BP 142/78 | HR 59 | Temp 97.8°F | Resp 18 | Ht 68.0 in | Wt 181.0 lb

## 2015-02-11 DIAGNOSIS — Z79899 Other long term (current) drug therapy: Secondary | ICD-10-CM | POA: Insufficient documentation

## 2015-02-11 DIAGNOSIS — I4891 Unspecified atrial fibrillation: Secondary | ICD-10-CM | POA: Diagnosis not present

## 2015-02-11 DIAGNOSIS — M1990 Unspecified osteoarthritis, unspecified site: Secondary | ICD-10-CM | POA: Insufficient documentation

## 2015-02-11 DIAGNOSIS — R1032 Left lower quadrant pain: Secondary | ICD-10-CM | POA: Diagnosis not present

## 2015-02-11 DIAGNOSIS — I129 Hypertensive chronic kidney disease with stage 1 through stage 4 chronic kidney disease, or unspecified chronic kidney disease: Secondary | ICD-10-CM | POA: Insufficient documentation

## 2015-02-11 DIAGNOSIS — I252 Old myocardial infarction: Secondary | ICD-10-CM | POA: Insufficient documentation

## 2015-02-11 DIAGNOSIS — R002 Palpitations: Secondary | ICD-10-CM

## 2015-02-11 DIAGNOSIS — E785 Hyperlipidemia, unspecified: Secondary | ICD-10-CM | POA: Diagnosis not present

## 2015-02-11 DIAGNOSIS — I2510 Atherosclerotic heart disease of native coronary artery without angina pectoris: Secondary | ICD-10-CM | POA: Diagnosis not present

## 2015-02-11 DIAGNOSIS — Z86010 Personal history of colonic polyps: Secondary | ICD-10-CM | POA: Insufficient documentation

## 2015-02-11 DIAGNOSIS — F419 Anxiety disorder, unspecified: Secondary | ICD-10-CM | POA: Diagnosis not present

## 2015-02-11 DIAGNOSIS — J441 Chronic obstructive pulmonary disease with (acute) exacerbation: Secondary | ICD-10-CM | POA: Insufficient documentation

## 2015-02-11 DIAGNOSIS — E039 Hypothyroidism, unspecified: Secondary | ICD-10-CM | POA: Insufficient documentation

## 2015-02-11 DIAGNOSIS — N189 Chronic kidney disease, unspecified: Secondary | ICD-10-CM | POA: Insufficient documentation

## 2015-02-11 DIAGNOSIS — Z87442 Personal history of urinary calculi: Secondary | ICD-10-CM | POA: Diagnosis not present

## 2015-02-11 DIAGNOSIS — K5792 Diverticulitis of intestine, part unspecified, without perforation or abscess without bleeding: Secondary | ICD-10-CM | POA: Diagnosis not present

## 2015-02-11 DIAGNOSIS — K5732 Diverticulitis of large intestine without perforation or abscess without bleeding: Secondary | ICD-10-CM | POA: Diagnosis not present

## 2015-02-11 DIAGNOSIS — Z87891 Personal history of nicotine dependence: Secondary | ICD-10-CM | POA: Insufficient documentation

## 2015-02-11 DIAGNOSIS — M109 Gout, unspecified: Secondary | ICD-10-CM | POA: Insufficient documentation

## 2015-02-11 LAB — CBC WITH DIFFERENTIAL/PLATELET
BASOS PCT: 1 %
Basophils Absolute: 0 10*3/uL (ref 0.0–0.1)
EOS ABS: 0.2 10*3/uL (ref 0.0–0.7)
EOS PCT: 5 %
HCT: 43.2 % (ref 39.0–52.0)
Hemoglobin: 14.5 g/dL (ref 13.0–17.0)
LYMPHS ABS: 1 10*3/uL (ref 0.7–4.0)
LYMPHS PCT: 23 %
MCH: 32 pg (ref 26.0–34.0)
MCHC: 33.6 g/dL (ref 30.0–36.0)
MCV: 95.4 fL (ref 78.0–100.0)
Monocytes Absolute: 0.4 10*3/uL (ref 0.1–1.0)
Monocytes Relative: 8 %
NEUTROS ABS: 2.9 10*3/uL (ref 1.7–7.7)
Neutrophils Relative %: 63 %
Platelets: 187 10*3/uL (ref 150–400)
RBC: 4.53 MIL/uL (ref 4.22–5.81)
RDW: 13.8 % (ref 11.5–15.5)
WBC: 4.5 10*3/uL (ref 4.0–10.5)

## 2015-02-11 LAB — BRAIN NATRIURETIC PEPTIDE: B NATRIURETIC PEPTIDE 5: 112.2 pg/mL — AB (ref 0.0–100.0)

## 2015-02-11 LAB — COMPREHENSIVE METABOLIC PANEL
ALBUMIN: 3.6 g/dL (ref 3.5–5.0)
ALT: 18 U/L (ref 17–63)
ANION GAP: 8 (ref 5–15)
AST: 21 U/L (ref 15–41)
Alkaline Phosphatase: 109 U/L (ref 38–126)
BUN: 14 mg/dL (ref 6–20)
CHLORIDE: 110 mmol/L (ref 101–111)
CO2: 23 mmol/L (ref 22–32)
Calcium: 9.8 mg/dL (ref 8.9–10.3)
Creatinine, Ser: 1.58 mg/dL — ABNORMAL HIGH (ref 0.61–1.24)
GFR calc non Af Amer: 40 mL/min — ABNORMAL LOW (ref >60)
GFR, EST AFRICAN AMERICAN: 47 mL/min — AB (ref >60)
GLUCOSE: 108 mg/dL — AB (ref 65–99)
Potassium: 4.9 mmol/L (ref 3.5–5.1)
SODIUM: 141 mmol/L (ref 135–145)
Total Bilirubin: 0.8 mg/dL (ref 0.3–1.2)
Total Protein: 7.4 g/dL (ref 6.5–8.1)

## 2015-02-11 LAB — I-STAT TROPONIN, ED: TROPONIN I, POC: 0 ng/mL (ref 0.00–0.08)

## 2015-02-11 LAB — POCT CBC
Granulocyte percent: 65.4 %G (ref 37–80)
HCT, POC: 44.7 % (ref 43.5–53.7)
Hemoglobin: 14.2 g/dL (ref 14.1–18.1)
Lymph, poc: 1.4 (ref 0.6–3.4)
MCH, POC: 30.3 pg (ref 27–31.2)
MCHC: 31.7 g/dL — AB (ref 31.8–35.4)
MCV: 95.7 fL (ref 80–97)
MID (cbc): 0.4 (ref 0–0.9)
MPV: 7.1 fL (ref 0–99.8)
POC Granulocyte: 3.5 (ref 2–6.9)
POC LYMPH PERCENT: 26.5 %L (ref 10–50)
POC MID %: 8.1 %M (ref 0–12)
Platelet Count, POC: 206 10*3/uL (ref 142–424)
RBC: 4.67 M/uL — AB (ref 4.69–6.13)
RDW, POC: 15.1 %
WBC: 5.3 10*3/uL (ref 4.6–10.2)

## 2015-02-11 LAB — MAGNESIUM: Magnesium: 2 mg/dL (ref 1.7–2.4)

## 2015-02-11 LAB — LIPASE, BLOOD: Lipase: 40 U/L (ref 22–51)

## 2015-02-11 NOTE — ED Notes (Signed)
Pt. Has a very extensive cardiac history including triple bypass, AAA, silent MI, a. Flutter, a.fib. Pt c/o abdominal pain and palpitations went to urgent care. Urgent care sent pt. Here due to some noted ST depression in lateral leads that were not present on previous EKGs. Pt. In NAD at this time

## 2015-02-11 NOTE — ED Notes (Signed)
Dr. Liu at bedside 

## 2015-02-11 NOTE — ED Provider Notes (Signed)
CSN: [id number]     Arrival date & time 02/11/15  1908 History   First MD Initiated Contact with Patient 02/11/15 1917     Chief Complaint  Patient presents with  . Atrial Fibrillation   (Consider location/radiation/quality/duration/timing/severity/associated sxs/prior Treatment) Patient is a 80 y.o. male presenting with atrial fibrillation and palpitations.  Atrial Fibrillation Pertinent negatives include no abdominal pain, chest pain, coughing, diaphoresis, fever, nausea or vomiting.  Palpitations Palpitations quality:  Unable to specify Onset quality:  Gradual Duration:  7 days Timing:  Constant Progression:  Unchanged Chronicity:  Recurrent Context: anxiety   Context comment:  With spireva Associated symptoms: shortness of breath   Associated symptoms: no chest pain, no cough, no diaphoresis, no nausea and no vomiting     Past Medical History  Diagnosis Date  . Hypothyroidism 12/05/2008  . HYPERLIPIDEMIA 09/19/2008  . GOUT 09/19/2008  . ANEMIA-IRON DEFICIENCY 09/19/2008  . HYPERTENSION 09/19/2008  . CAD 12/05/2008    s/p CABG; NSTEMI 05/2011 - LHC 05/25/11: LAD occluded, LIMA-LAD patent, ostial circumflex 50%, AV circumflex 70%, SVG-OM2 with extensive disease with thrombus (culprit vessel), RCA 80% and occluded, SVG-intermediate patent, SVG-PDA/PL A patent.  Given that the culprit vessel was a subtotally occluded heavily thrombotic graft to a smaller OM2, medical therapy was recommended.;   . Atrial fibrillation 04/19/2008    amiodarone rx;  Echocardiogram 05/26/11: No wall motion abnormalities, mild LVH, EF 60%.  . Atrial flutter 12/05/2008    s/p RFCA  . BRADYCARDIA 12/05/2008  . AAA 09/19/2008  . PERIPHERAL VASCULAR DISEASE 09/19/2008  . GERD 09/19/2008  . DIVERTICULOSIS, COLON 09/19/2008  . RENAL INSUFFICIENCY 09/19/2008  . LOW BACK PAIN 09/19/2008  . PSA, INCREASED 09/19/2008  . COLONIC POLYPS, HX OF 09/19/2008    adenomatous polyps 07/2010  . GASTROINTESTINAL HEMORRHAGE, HX OF 04/19/2008  .  NEPHROLITHIASIS, HX OF 01/11/2009  . CORONARY ARTERY BYPASS GRAFT, HX OF 12/05/2008    A. LIMA-LAD, VG-RI, VG-OM2, VG-RPD/RPL;  B. 05/2011 - NSTEMI - CATH WITH 4/5 PATENT GRAFTS AND NEW THROMBUS IN DISTAL VG-OM2 - MED RX  . ANXIETY 06/13/2010  . Arthritis   . COPD (chronic obstructive pulmonary disease)   . Myocardial infarction   . Allergy   . Benign esophageal stricture 09/2075    dilated during EGD  . Erectile dysfunction 09/13/2012  . CKD (chronic kidney disease) 09/19/2008    Qualifier: Diagnosis of  By: Jenny Reichmann MD, Hunt Oris    Past Surgical History  Procedure Laterality Date  . Aorto-femoral bypass x5 - stress test neg 9/08    . Coronary artery bypass graft    . S/p afib ablation  2004  . Cardiac catheterization  05/25/2011  . Colon surgery    . Esophagogastroduodenoscopy  04/28/2012    Procedure: ESOPHAGOGASTRODUODENOSCOPY (EGD);  Surgeon: [name], MD;  Location: [place];  Service: Endoscopy;  Laterality: N/A;  . Enteroscopy N/A 02/13/2014    Procedure: ENTEROSCOPY;  Surgeon: [name], MD;  Location: [place];  Service: Gastroenterology;  Laterality: N/A;  super slim  . Left heart catheterization with coronary/graft angiogram  05/25/2011    Procedure: LEFT HEART CATHETERIZATION WITH [name];  Surgeon: [name], MD;  Location: [facility] CATH LAB;  Service: Cardiovascular;;   Family History  Problem Relation Age of Onset  . Diabetes Father   . Diabetes Sister   . Multiple myeloma Mother   . Cancer Mother   . Other Mother     varicose veins  . Heart  disease Paternal Uncle   . Heart disease Maternal Uncle   . Lymphoma Sister     half-sister   Social History  Substance Use Topics  . Smoking status: Former Smoker -- 4.00 packs/day for 31 years    Types: Cigarettes    Quit date: 03/19/1983  . Smokeless tobacco: Never Used  . Alcohol Use: 6.0 oz/week    10 Shots of liquor per week    Review of Systems  Constitutional: Negative for fever and  diaphoresis.  Respiratory: Positive for shortness of breath. Negative for cough and chest tightness.   Cardiovascular: Positive for palpitations. Negative for chest pain and leg swelling.  Gastrointestinal: Negative for nausea, vomiting, abdominal pain, diarrhea and constipation.  All other systems reviewed and are negative.     Allergies  Bee venom; Promethazine hcl; Doxycycline hyclate; Lipitor; Other; Tetracycline; Crestor; and Tetanus toxoid  Home Medications   Prior to Admission medications   Medication Sig Start Date End Date Taking? Authorizing Provider  albuterol (PROVENTIL HFA;VENTOLIN HFA) 108 (90 BASE) MCG/ACT inhaler Inhale 2 puffs into the lungs every 6 (six) hours as needed for wheezing or shortness of breath.    Historical Provider, MD  allopurinol (ZYLOPRIM) 300 MG tablet Take 1 tablet (300 mg total) by mouth daily. 06/27/14   Biagio Borg, MD  amiodarone (PACERONE) 200 MG tablet TAKE 1/2 TABLET BY MOUTH DAILY 08/06/14   Biagio Borg, MD  aspirin EC 81 MG tablet Take 1 tablet (81 mg total) by mouth daily. Patient taking differently: Take 162 mg by mouth daily.  02/15/14   Samella Parr, NP  b complex vitamins tablet Take 1 tablet by mouth daily.    Historical Provider, MD  calcium elemental as carbonate (TUMS ULTRA 1000) 400 MG tablet Chew 1,000 mg by mouth daily as needed for heartburn.    Historical Provider, MD  Cholecalciferol (VITAMIN D) 2000 UNITS CAPS Take by mouth daily.    Historical Provider, MD  EPINEPHrine (EPIPEN) 0.3 mg/0.3 mL SOAJ injection Inject 0.3 mLs (0.3 mg total) into the muscle once. 02/01/13   Gay Filler Copland, MD  hydrocortisone cream 0.5 % Apply 1 application topically daily.     Historical Provider, MD  levothyroxine (SYNTHROID, LEVOTHROID) 125 MCG tablet Take 1 tablet (125 mcg total) by mouth daily. 06/27/14   Biagio Borg, MD  MAGNESIUM PO Take by mouth daily.    Historical Provider, MD  Multiple Vitamin (MULTIVITAMIN) tablet Take 1 tablet by  mouth at bedtime.     Historical Provider, MD  nitroGLYCERIN (NITROSTAT) 0.4 MG SL tablet Place 1 tablet (0.4 mg total) under the tongue every 5 (five) minutes as needed for chest pain. 06/27/14 03/17/16  Biagio Borg, MD  pantoprazole (PROTONIX) 40 MG tablet Take 1 tablet (40 mg total) by mouth daily. 06/27/14   Biagio Borg, MD  tiotropium (SPIRIVA HANDIHALER) 18 MCG inhalation capsule PLACE 1 CAPSULE (18 MCG TOTAL) INTO INHALER AND INHALE DAILY. 06/27/14   Biagio Borg, MD  vardenafil (LEVITRA) 20 MG tablet Take 1 tablet (20 mg total) by mouth as needed for erectile dysfunction. 06/27/14   Biagio Borg, MD   BP 171/90 mmHg  Pulse 61  Resp 20  SpO2 98% Physical Exam  Constitutional: He is oriented to person, place, and time. He appears well-developed and well-nourished. No distress.  HENT:  Head: Normocephalic.  Eyes: Pupils are equal, round, and reactive to light.  Neck: Normal range of motion. No JVD present.  Cardiovascular:  Bradycardia  Pulmonary/Chest: Effort normal. No respiratory distress. He has no wheezes. He has no rales.  Abdominal: Soft. He exhibits no distension. There is no tenderness.  Mild tenderness to LLQ  Musculoskeletal: Normal range of motion. He exhibits no tenderness.  Neurological: He is alert and oriented to person, place, and time. No cranial nerve deficit. Coordination normal.  Skin: Skin is warm and dry. He is not diaphoretic.  Psychiatric: His behavior is normal.  Nursing note and vitals reviewed.   ED Course  Procedures (including critical care time) Labs Review Labs Reviewed  COMPREHENSIVE METABOLIC PANEL - Abnormal; Notable for the following:    Glucose, Bld 108 (*)    Creatinine, Ser 1.58 (*)    GFR calc non Af Amer 40 (*)    GFR calc Af Amer 47 (*)    All other components within normal limits  BRAIN NATRIURETIC PEPTIDE - Abnormal; Notable for the following:    B Natriuretic Peptide 112.2 (*)    All other components within normal limits   LIPASE, BLOOD  CBC WITH DIFFERENTIAL/PLATELET  MAGNESIUM  I-STAT TROPOININ, ED  I-STAT TROPOININ, ED   Imaging Review Dg Chest Portable 1 View  02/12/2015   CLINICAL DATA:  Atrial fibrillation  EXAM: PORTABLE CHEST 1 VIEW  COMPARISON:  11/23/2012  FINDINGS: Status post CABG with stable mild cardiomegaly. Stable aortic tortuosity.  Mild atelectasis or scarring, greater on the left. There is no edema, consolidation, effusion, or pneumothorax. No acute osseous finding.  IMPRESSION: No acute finding.   Electronically Signed   By: Monte Fantasia M.D.   On: 02/12/2015 00:26   I have personally reviewed and evaluated these images and lab results as part of my medical decision-making.   EKG Interpretation   Date/Time:  Monday February 11 2015 21:40:55 EDT Ventricular Rate:  56 PR Interval:  192 QRS Duration: 119 QT Interval:  590 QTC Calculation: 569 R Axis:   -71 Text Interpretation:  Sinus rhythm TWI and ST minimal ST depression in  lateral leads No significant change since last tracing Confirmed by LIU  MD, Hinton Dyer ([id number]) on 02/11/2015 9:47:33 PM      MDM   Mr. Hollenbaugh presents to the ED for further workup of his chest "tingling" feeling over the past 6-7 days. He states that he feels like "my heart isn't beating like it is supposed to." He has also had SOB that is associated with this sensation but states he has chronic SOB. He states it is intermittent but specifically worse when he takes his Spiriva.  Denies pain. Previous MI's were silent. Patient was seen at [facility] today for diverticulosis and had new ST changes in lateral leads. Given his vague symptoms of palpitations and changes in lateral leads, he was transferred here for further evaluation.   More than likely dysrhythmia. Doubt PE as patient is Low Well's and story is inconsistent. Doubt ACS at this time, troponins negative.  Patient has close follow up with Cardiologist 2 days from today. Repeat EKG was repeated and  same. Previous EKGs in 01/2014 and 2014 showed similar T wave abnormalities with same. History of bradycardia for patient. (One EKG shows prolonged QTc but do not believe this calculation is accurately calculated.  Given his lack of cardiopulmonary symptoms, his well appearance and negative troponins, we do not believe he requires further intervention at this point. Chest x-ray largely unchanged from previous and given clear lungs, no fever and no desaturations, do not believe any PNA or  significant pulmonary congestion.   Given bradycardia and palpitations, will given patient Augmentin instead of fluoroquinolone for diverticulitis. Patient was in agreement with this plan. Strict return precautions and patient discharged home in good health.    Final diagnoses:  Palpitations  Diverticulitis of intestine without perforation or abscess without bleeding    Roberto Scales, MD 02/12/15 0041  Forde Dandy, MD 02/12/15 1336

## 2015-02-11 NOTE — Progress Notes (Signed)
This chart was scribed for Robyn Haber, MD by Moises Blood, medical scribe at [facility].The patient was seen in exam room 11 and the patient's care was started at 5:31 PM.  Patient ID: Omar Benson MRN: [id number], DOB: 1934/06/20, 80 y.o. Date of Encounter: 02/11/2015  Primary Physician: [name], MD  Chief Complaint:  Chief Complaint  Patient presents with  . Abdominal Pain    x 1 week, getting worse, hx of diverticulitis    HPI:  Omar Benson is a 80 y.o. male who has h/o diverticulitis presents to [facility] complaining of central abdominal pain for about a week and it's getting worse. Last week, he felt the pain in his back and had some diarrhea. He's eating less in the last couple of days. He's concerned it might be diverticulitis and was given cipro. His next colonoscopy in scheduled in March 2017. He denies fever.   In the past, patient has had a painless MI. His original problem resulted in a 5 bypass surgery. At that time he had pain between shoulder blades. He says that he's had some dyspnea on exertion over the last 2-3 months.  He also feels some palpitations and skips a beat. He denies dizziness. He has a Film/video editor appointment with Dr. Lovena Le in 3 days.    Past Medical History  Diagnosis Date  . Hypothyroidism 12/05/2008  . HYPERLIPIDEMIA 09/19/2008  . GOUT 09/19/2008  . ANEMIA-IRON DEFICIENCY 09/19/2008  . HYPERTENSION 09/19/2008  . CAD 12/05/2008    s/p CABG; NSTEMI 05/2011 - LHC 05/25/11: LAD occluded, LIMA-LAD patent, ostial circumflex 50%, AV circumflex 70%, SVG-OM2 with extensive disease with thrombus (culprit vessel), RCA 80% and occluded, SVG-intermediate patent, SVG-PDA/PL A patent.  Given that the culprit vessel was a subtotally occluded heavily thrombotic graft to a smaller OM2, medical therapy was recommended.;   . Atrial fibrillation 04/19/2008    amiodarone rx;  Echocardiogram 05/26/11: No wall motion abnormalities, mild  LVH, EF 60%.  . Atrial flutter 12/05/2008    s/p RFCA  . BRADYCARDIA 12/05/2008  . AAA 09/19/2008  . PERIPHERAL VASCULAR DISEASE 09/19/2008  . GERD 09/19/2008  . DIVERTICULOSIS, COLON 09/19/2008  . RENAL INSUFFICIENCY 09/19/2008  . LOW BACK PAIN 09/19/2008  . PSA, INCREASED 09/19/2008  . COLONIC POLYPS, HX OF 09/19/2008    adenomatous polyps 07/2010  . GASTROINTESTINAL HEMORRHAGE, HX OF 04/19/2008  . NEPHROLITHIASIS, HX OF 01/11/2009  . CORONARY ARTERY BYPASS GRAFT, HX OF 12/05/2008    A. LIMA-LAD, VG-RI, VG-OM2, VG-RPD/RPL;  B. 05/2011 - NSTEMI - CATH WITH 4/5 PATENT GRAFTS AND NEW THROMBUS IN DISTAL VG-OM2 - MED RX  . ANXIETY 06/13/2010  . Arthritis   . COPD (chronic obstructive pulmonary disease)   . Myocardial infarction   . Allergy   . Benign esophageal stricture 09/2075    dilated during EGD  . Erectile dysfunction 09/13/2012  . CKD (chronic kidney disease) 09/19/2008    Qualifier: Diagnosis of  By: Jenny Reichmann MD, [place]: Prior to Admission medications   Medication Sig Start Date End Date Taking? Authorizing Provider  albuterol (PROVENTIL HFA;VENTOLIN HFA) 108 (90 BASE) MCG/ACT inhaler Inhale 2 puffs into the lungs every 6 (six) hours as needed for wheezing or shortness of breath.   Yes Historical Provider, MD  allopurinol (ZYLOPRIM) 300 MG tablet Take 1 tablet (300 mg total) by mouth daily. 06/27/14  Yes Biagio Borg, MD  amiodarone (PACERONE) 200  MG tablet TAKE 1/2 TABLET BY MOUTH DAILY 08/06/14  Yes Biagio Borg, MD  aspirin EC 81 MG tablet Take 1 tablet (81 mg total) by mouth daily. Patient taking differently: Take 162 mg by mouth daily.  02/15/14  Yes Samella Parr, NP  b complex vitamins tablet Take 1 tablet by mouth daily.   Yes Historical Provider, MD  calcium elemental as carbonate (TUMS ULTRA 1000) 400 MG tablet Chew 1,000 mg by mouth daily as needed for heartburn.   Yes Historical Provider, MD  Cholecalciferol (VITAMIN D) 2000 UNITS CAPS Take by mouth daily.   Yes Historical  Provider, MD  EPINEPHrine (EPIPEN) 0.3 mg/0.3 mL SOAJ injection Inject 0.3 mLs (0.3 mg total) into the muscle once. 02/01/13  Yes Gay Filler Copland, MD  hydrocortisone cream 0.5 % Apply 1 application topically daily.    Yes Historical Provider, MD  levothyroxine (SYNTHROID, LEVOTHROID) 125 MCG tablet Take 1 tablet (125 mcg total) by mouth daily. 06/27/14  Yes Biagio Borg, MD  MAGNESIUM PO Take by mouth daily.   Yes Historical Provider, MD  Multiple Vitamin (MULTIVITAMIN) tablet Take 1 tablet by mouth at bedtime.    Yes Historical Provider, MD  nitroGLYCERIN (NITROSTAT) 0.4 MG SL tablet Place 1 tablet (0.4 mg total) under the tongue every 5 (five) minutes as needed for chest pain. 06/27/14 03/17/16 Yes Biagio Borg, MD  pantoprazole (PROTONIX) 40 MG tablet Take 1 tablet (40 mg total) by mouth daily. 06/27/14  Yes Biagio Borg, MD  tiotropium (SPIRIVA HANDIHALER) 18 MCG inhalation capsule PLACE 1 CAPSULE (18 MCG TOTAL) INTO INHALER AND INHALE DAILY. 06/27/14  Yes Biagio Borg, MD  vardenafil (LEVITRA) 20 MG tablet Take 1 tablet (20 mg total) by mouth as needed for erectile dysfunction. 06/27/14  Yes Biagio Borg, MD    Allergies:  Allergies  Allergen Reactions  . Bee Venom Anaphylaxis    Only yellow jackets   . Promethazine Hcl Other (See Comments)    syncope  . Doxycycline Hyclate Other (See Comments)    esophagitis  . Lipitor [Atorvastatin Calcium] Other (See Comments)    Myalgia/myopathy  . Other Diarrhea and Nausea And Vomiting    Seafood  . Tetracycline Other (See Comments)    Esophagitis  . Crestor [Rosuvastatin] Other (See Comments)    Myalgia/myopathy  . Tetanus Toxoid Rash    Social History   Social History  . Marital Status: Married    Spouse Name: [name]  . Number of Children: N/A  . Years of Education: N/A   Occupational History  . self employeed - Contractor    Social History Main Topics  . Smoking status: Former Smoker -- 4.00 packs/day for 31 years    Types:  Cigarettes    Quit date: 03/19/1983  . Smokeless tobacco: Never Used  . Alcohol Use: 6.0 oz/week    10 Shots of liquor per week  . Drug Use: No  . Sexual Activity: Yes   Other Topics Concern  . Not on file   Social History Narrative   Married lives with his wife     Review of Systems: Constitutional: negative for chills, fever, night sweats, weight changes, or fatigue ; positive for appetite loss HEENT: negative for vision changes, hearing loss, congestion, rhinorrhea, ST, epistaxis, or sinus pressure Cardiovascular: negative for chest pain or palpitations Respiratory: negative for hemoptysis, wheezing, shortness of breath, or cough Abdominal: negative for nausea, vomiting, or constipation; positive for abdominal pain, diarrhea Dermatological: negative for  rash Neurologic: negative for headache, dizziness, or syncope All other systems reviewed and are otherwise negative with the exception to those above and in the HPI.  Physical Exam: Blood pressure 142/78, pulse 59, temperature 97.8 F (36.6 C), resp. rate 18, height 5\' 8"  (1.727 m), weight 181 lb (82.101 kg), SpO2 97 %., Body mass index is 27.53 kg/(m^2). General: Well developed, well nourished, in no acute distress. Head: Normocephalic, atraumatic, eyes without discharge, sclera non-icteric, nares are without discharge. Bilateral auditory canals clear, TM's are without perforation, pearly grey and translucent with reflective cone of light bilaterally. Oral cavity moist, posterior pharynx without exudate, erythema, peritonsillar abscess, or post nasal drip.  Neck: Supple. No thyromegaly. Full ROM. No lymphadenopathy. Lungs: Clear bilaterally to auscultation without wheezes, rales, or rhonchi. Breathing is unlabored. Heart: RRR with S1 S2. No murmurs, rubs, or gallops appreciated. Abdomen: Soft, non-distended with normoactive bowel sounds. No hepatomegaly. No obvious abdominal masses. Tender in lower abdomen and more in LLQ without  masses or HSM Msk:  Strength and tone normal for age. Extremities/Skin: Warm and dry. No clubbing or cyanosis. No edema. No rashes or suspicious lesions. Neuro: Alert and oriented X 3. Moves all extremities spontaneously. Gait is normal. CNII-XII grossly in tact. Psych:  Responds to questions appropriately with a normal affect.   Labs: Results for orders placed or performed in visit on 02/11/15  POCT CBC  Result Value Ref Range   WBC 5.3 4.6 - 10.2 K/uL   Lymph, poc 1.4 0.6 - 3.4   POC LYMPH PERCENT 26.5 10 - 50 %L   MID (cbc) 0.4 0 - 0.9   POC MID % 8.1 0 - 12 %M   POC Granulocyte 3.5 2 - 6.9   Granulocyte percent 65.4 37 - 80 %G   RBC 4.67 (A) 4.69 - 6.13 M/uL   Hemoglobin 14.2 14.1 - 18.1 g/dL   HCT, POC 44.7 43.5 - 53.7 %   MCV 95.7 80 - 97 fL   MCH, POC 30.3 27 - 31.2 pg   MCHC 31.7 (A) 31.8 - 35.4 g/dL   RDW, POC 15.1 %   Platelet Count, POC 206 142 - 424 K/uL   MPV 7.1 0 - 99.8 fL   EKG:  Sinus bradycardia, ST-T wave changes in lateral leads not seen on previous EKG's  ASSESSMENT AND PLAN:  80 y.o. year old male with palpitations and new ST-T wave changes.   He also has lower abdominal pain with tenderness left side c/w diverticulitis, although the CBC would suggest this is mild. This chart was scribed in my presence and reviewed by me personally.    ICD-9-CM ICD-10-CM   1. Diverticulitis of colon 562.11 K57.32 POCT CBC  2. Palpitations 785.1 G26.9 COMPLETE METABOLIC PANEL WITH GFR     TSH     EKG 12-Lead   Because patient has had an MI that was painless in the past, and he has some vague new symptoms, he does need further evaluation in the emergency department.. [facility] By signing my name below, I, Moises Blood, attest that this documentation has been prepared under the direction and in the presence of Robyn Haber, MD. Electronically Signed: Moises Blood, [place]. 02/11/2015 , 5:31 PM .  Signed, Robyn Haber,  MD 02/11/2015 5:31 PM

## 2015-02-12 ENCOUNTER — Emergency Department (HOSPITAL_COMMUNITY): Payer: Medicare Other

## 2015-02-12 DIAGNOSIS — I4891 Unspecified atrial fibrillation: Secondary | ICD-10-CM | POA: Diagnosis not present

## 2015-02-12 DIAGNOSIS — K5792 Diverticulitis of intestine, part unspecified, without perforation or abscess without bleeding: Secondary | ICD-10-CM | POA: Diagnosis not present

## 2015-02-12 LAB — COMPLETE METABOLIC PANEL WITH GFR
ALT: 14 U/L (ref 9–46)
AST: 14 U/L (ref 10–35)
Albumin: 4.1 g/dL (ref 3.6–5.1)
Alkaline Phosphatase: 128 U/L — ABNORMAL HIGH (ref 40–115)
BUN: 15 mg/dL (ref 7–25)
CO2: 25 mmol/L (ref 20–31)
Calcium: 9.9 mg/dL (ref 8.6–10.3)
Chloride: 104 mmol/L (ref 98–110)
Creat: 1.51 mg/dL — ABNORMAL HIGH (ref 0.70–1.18)
GFR, Est African American: 50 mL/min — ABNORMAL LOW (ref >=60)
GFR, Est Non African American: 44 mL/min — ABNORMAL LOW (ref >=60)
Glucose, Bld: 132 mg/dL — ABNORMAL HIGH (ref 65–99)
Potassium: 4.9 mmol/L (ref 3.5–5.3)
Sodium: 140 mmol/L (ref 135–146)
Total Bilirubin: 0.7 mg/dL (ref 0.2–1.2)
Total Protein: 7.2 g/dL (ref 6.1–8.1)

## 2015-02-12 LAB — I-STAT TROPONIN, ED: Troponin i, poc: 0.01 ng/mL (ref 0.00–0.08)

## 2015-02-12 LAB — TSH: TSH: 0.864 u[IU]/mL (ref 0.350–4.500)

## 2015-02-12 MED ORDER — AMOXICILLIN-POT CLAVULANATE 875-125 MG PO TABS: 1.0000 | ORAL_TABLET | Freq: Two times a day (BID) | ORAL | Status: DC

## 2015-02-12 NOTE — Discharge Instructions (Signed)
Palpitations A palpitation is the feeling that your heartbeat is irregular or is faster than normal. It may feel like your heart is fluttering or skipping a beat. Palpitations are usually not a serious problem. However, in some cases, you may need further medical evaluation. CAUSES  Palpitations can be caused by:  Smoking.  Caffeine or other stimulants, such as diet pills or energy drinks.  Alcohol.  Stress and anxiety.  Strenuous physical activity.  Fatigue.  Certain medicines.  Heart disease, especially if you have a history of irregular heart rhythms (arrhythmias), such as atrial fibrillation, atrial flutter, or supraventricular tachycardia.  An improperly working pacemaker or defibrillator. DIAGNOSIS  To find the cause of your palpitations, your health care provider will take your medical history and perform a physical exam. Your health care provider may also have you take a test called an ambulatory electrocardiogram (ECG). An ECG records your heartbeat patterns over a 24-hour period. You may also have other tests, such as:  Transthoracic echocardiogram (TTE). During echocardiography, sound waves are used to evaluate how blood flows through your heart.  Transesophageal echocardiogram (TEE).  Cardiac monitoring. This allows your health care provider to monitor your heart rate and rhythm in real time.  Holter monitor. This is a portable device that records your heartbeat and can help diagnose heart arrhythmias. It allows your health care provider to track your heart activity for several days, if needed.  Stress tests by exercise or by giving medicine that makes the heart beat faster. TREATMENT  Treatment of palpitations depends on the cause of your symptoms and can vary greatly. Most cases of palpitations do not require any treatment other than time, relaxation, and monitoring your symptoms. Other causes, such as atrial fibrillation, atrial flutter, or supraventricular  tachycardia, usually require further treatment. HOME CARE INSTRUCTIONS   Avoid:  Caffeinated coffee, tea, soft drinks, diet pills, and energy drinks.  Chocolate.  Alcohol.  Stop smoking if you smoke.  Reduce your stress and anxiety. Things that can help you relax include:  A method of controlling things in your body, such as your heartbeats, with your mind (biofeedback).  Yoga.  Meditation.  Physical activity such as swimming, jogging, or walking.  Get plenty of rest and sleep. SEEK MEDICAL CARE IF:   You continue to have a fast or irregular heartbeat beyond 24 hours.  Your palpitations occur more often. SEEK IMMEDIATE MEDICAL CARE IF:  You have chest pain or shortness of breath.  You have a severe headache.  You feel dizzy or you faint. MAKE SURE YOU:  Understand these instructions.  Will watch your condition.  Will get help right away if you are not doing well or get worse. Document Released: 05/01/2000 Document Revised: 05/09/2013 Document Reviewed: 07/03/2011 [facility] Patient Information 2015 [place], [place]. This information is not intended to replace advice given to you by your health care provider. Make sure you discuss any questions you have with your health care provider.   Diverticulitis Diverticulitis is when small pockets that have formed in your colon (large intestine) become infected or swollen. HOME CARE  Follow your doctor's instructions.  Follow a special diet if told by your doctor.  When you feel better, your doctor may tell you to change your diet. You may be told to eat a lot of fiber. Fruits and vegetables are good sources of fiber. Fiber makes it easier to poop (have bowel movements).  Take supplements or probiotics as told by your doctor.  Only take medicines as told  by your doctor.  Keep all follow-up visits with your doctor. GET HELP IF:  Your pain does not get better.  You have a hard time eating food.  You are not pooping  like normal. GET HELP RIGHT AWAY IF:  Your pain gets worse.  Your problems do not get better.  Your problems suddenly get worse.  You have a fever.  You keep throwing up (vomiting).  You have bloody or black, tarry poop (stool). MAKE SURE YOU:   Understand these instructions.  Will watch your condition.  Will get help right away if you are not doing well or get worse. Document Released: 10/21/2007 Document Revised: 05/09/2013 Document Reviewed: 03/29/2013 [facility] Patient Information 2015 [place], [place]. This information is not intended to replace advice given to you by your health care provider. Make sure you discuss any questions you have with your health care provider.

## 2015-02-12 NOTE — ED Notes (Signed)
Pt. Left with all belongings and refused wheelchair. Discharge instructions were reviewed and all questions were answered.  

## 2015-02-14 ENCOUNTER — Ambulatory Visit (INDEPENDENT_AMBULATORY_CARE_PROVIDER_SITE_OTHER): Payer: Medicare Other | Admitting: Internal Medicine

## 2015-02-14 ENCOUNTER — Encounter: Payer: Self-pay | Admitting: Internal Medicine

## 2015-02-14 VITALS — BP 140/88 | HR 56 | Ht 68.0 in | Wt 182.6 lb

## 2015-02-14 DIAGNOSIS — Q2733 Arteriovenous malformation of digestive system vessel: Secondary | ICD-10-CM

## 2015-02-14 DIAGNOSIS — I214 Non-ST elevation (NSTEMI) myocardial infarction: Secondary | ICD-10-CM | POA: Diagnosis not present

## 2015-02-14 DIAGNOSIS — K5520 Angiodysplasia of colon without hemorrhage: Secondary | ICD-10-CM

## 2015-02-14 DIAGNOSIS — I480 Paroxysmal atrial fibrillation: Secondary | ICD-10-CM

## 2015-02-14 MED ORDER — AMIODARONE HCL 200 MG PO TABS: 100.0000 mg | ORAL_TABLET | Freq: Every day | ORAL | Status: DC

## 2015-02-14 NOTE — Assessment & Plan Note (Signed)
He denies any frank blood loss. He will continue his current meds. He is not a candidate for systemic anti-coagulation due to bleeding.

## 2015-02-14 NOTE — Assessment & Plan Note (Signed)
He has maintained NSR. He will continue low dose amiodarone. He is not a candidate for anti-coagulation.

## 2015-02-14 NOTE — Progress Notes (Signed)
HPI [name] returns today for followup. He is a very pleasant 80 year old man with ischemic heart disease, status post bypass surgery, status post stenting, who also has a history of atrial flutter status post catheter ablation and a history of atrial fibrillation. In the interim, the patient has had some trouble with diverticulitis/losis.  He denies chest pain or shortness of breath. No syncope. Minimal palpitations. Allergies  Allergen Reactions  . Bee Venom Anaphylaxis    Only yellow jackets   . Promethazine Hcl Other (See Comments)    syncope  . Doxycycline Hyclate Other (See Comments)    esophagitis  . Lipitor [Atorvastatin Calcium] Other (See Comments)    Myalgia/myopathy  . Other Diarrhea and Nausea And Vomiting    Seafood  . Tetracycline Other (See Comments)    Esophagitis  . Crestor [Rosuvastatin] Other (See Comments)    Myalgia/myopathy  . Tetanus Toxoid Rash     Current Outpatient Prescriptions  Medication Sig Dispense Refill  . albuterol (PROVENTIL HFA;VENTOLIN HFA) 108 (90 BASE) MCG/ACT inhaler Inhale 2 puffs into the lungs every 6 (six) hours as needed for wheezing or shortness of breath.    . allopurinol (ZYLOPRIM) 300 MG tablet Take 1 tablet (300 mg total) by mouth daily. (Patient taking differently: Take 300 mg by mouth at bedtime. ) 90 tablet 3  . amiodarone (PACERONE) 200 MG tablet TAKE 1/2 TABLET BY MOUTH DAILY 45 tablet 3  . amoxicillin-clavulanate (AUGMENTIN) 875-125 MG per tablet Take 1 tablet by mouth every 12 (twelve) hours. 14 tablet 0  . aspirin EC 81 MG tablet Take 1 tablet (81 mg total) by mouth daily. (Patient taking differently: Take 162 mg by mouth daily. )    . b complex vitamins tablet Take 1 tablet by mouth daily.    . calcium elemental as carbonate (TUMS ULTRA 1000) 400 MG tablet Chew 1,000 mg by mouth daily as needed for heartburn.    . Cholecalciferol (VITAMIN D) 2000 UNITS CAPS Take 2,000 Units by mouth daily.     Marland Kitchen EPINEPHrine  (EPIPEN) 0.3 mg/0.3 mL SOAJ injection Inject 0.3 mLs (0.3 mg total) into the muscle once. 2 Device 2  . hydrocortisone cream 0.5 % Apply 1 application topically daily.     Marland Kitchen levothyroxine (SYNTHROID, LEVOTHROID) 125 MCG tablet Take 1 tablet (125 mcg total) by mouth daily. 90 tablet 3  . MAGNESIUM PO Take 1 tablet by mouth daily.     . Multiple Vitamin (MULTIVITAMIN) tablet Take 1 tablet by mouth at bedtime.     . nitroGLYCERIN (NITROSTAT) 0.4 MG SL tablet Place 1 tablet (0.4 mg total) under the tongue every 5 (five) minutes as needed for chest pain. 25 tablet 5  . pantoprazole (PROTONIX) 40 MG tablet Take 1 tablet (40 mg total) by mouth daily. (Patient taking differently: Take 40 mg by mouth at bedtime. ) 90 tablet 3  . tiotropium (SPIRIVA HANDIHALER) 18 MCG inhalation capsule PLACE 1 CAPSULE (18 MCG TOTAL) INTO INHALER AND INHALE DAILY. 90 capsule 3  . vardenafil (LEVITRA) 20 MG tablet Take 1 tablet (20 mg total) by mouth as needed for erectile dysfunction. 10 tablet 11   No current facility-administered medications for this visit.     Past Medical History  Diagnosis Date  . Hypothyroidism 12/05/2008  . HYPERLIPIDEMIA 09/19/2008  . GOUT 09/19/2008  . ANEMIA-IRON DEFICIENCY 09/19/2008  . HYPERTENSION 09/19/2008  . CAD 12/05/2008    s/p CABG; NSTEMI 05/2011 - LHC 05/25/11: LAD occluded, LIMA-LAD patent,  ostial circumflex 50%, AV circumflex 70%, SVG-OM2 with extensive disease with thrombus (culprit vessel), RCA 80% and occluded, SVG-intermediate patent, SVG-PDA/PL A patent.  Given that the culprit vessel was a subtotally occluded heavily thrombotic graft to a smaller OM2, medical therapy was recommended.;   . Atrial fibrillation 04/19/2008    amiodarone rx;  Echocardiogram 05/26/11: No wall motion abnormalities, mild LVH, EF 60%.  . Atrial flutter 12/05/2008    s/p RFCA  . BRADYCARDIA 12/05/2008  . AAA 09/19/2008  . PERIPHERAL VASCULAR DISEASE 09/19/2008  . GERD 09/19/2008  . DIVERTICULOSIS, COLON 09/19/2008  .  RENAL INSUFFICIENCY 09/19/2008  . LOW BACK PAIN 09/19/2008  . PSA, INCREASED 09/19/2008  . COLONIC POLYPS, HX OF 09/19/2008    adenomatous polyps 07/2010  . GASTROINTESTINAL HEMORRHAGE, HX OF 04/19/2008  . NEPHROLITHIASIS, HX OF 01/11/2009  . CORONARY ARTERY BYPASS GRAFT, HX OF 12/05/2008    A. LIMA-LAD, VG-RI, VG-OM2, VG-RPD/RPL;  B. 05/2011 - NSTEMI - CATH WITH 4/5 PATENT GRAFTS AND NEW THROMBUS IN DISTAL VG-OM2 - MED RX  . ANXIETY 06/13/2010  . Arthritis   . COPD (chronic obstructive pulmonary disease)   . Myocardial infarction   . Allergy   . Benign esophageal stricture 09/2075    dilated during EGD  . Erectile dysfunction 09/13/2012  . CKD (chronic kidney disease) 09/19/2008    Qualifier: Diagnosis of  By: Jonny Ruiz MD, Len Blalock     ROS:   All systems reviewed and negative except as noted in the HPI.   Past Surgical History  Procedure Laterality Date  . Aorto-femoral bypass x5 - stress test neg 9/08    . Coronary artery bypass graft    . S/p afib ablation  2004  . Cardiac catheterization  05/25/2011  . Colon surgery    . Esophagogastroduodenoscopy  04/28/2012    Procedure: ESOPHAGOGASTRODUODENOSCOPY (EGD);  Surgeon: Louis Meckel, MD;  Location: [facility] ENDOSCOPY;  Service: Endoscopy;  Laterality: N/A;  . Enteroscopy N/A 02/13/2014    Procedure: ENTEROSCOPY;  Surgeon: Beverley Fiedler, MD;  Location[facility] ENDOSCOPY;  Service: Gastroenterology;  Laterality: N/A;  super slim  . Left heart catheterization with coronary/graft angiogram  05/25/2011    Procedure: LEFT HEART CATHETERIZATION WITH Isabel Caprice;  Surgeon: Herby Abraham, MD;  Location: [facility] CATH LAB;  Service: Cardiovascular;;     Family History  Problem Relation Age of Onset  . Diabetes Father   . Diabetes Sister   . Multiple myeloma Mother   . Cancer Mother   . Other Mother     varicose veins  . Heart disease Paternal Uncle   . Heart disease Maternal Uncle   . Lymphoma Sister     half-sister     Social History   Social  History  . Marital Status: Married    Spouse Name: Omar Benson  . Number of Children: N/A  . Years of Education: N/A   Occupational History  . self employeed - Contractor    Social History Main Topics  . Smoking status: Former Smoker -- 4.00 packs/day for 31 years    Types: Cigarettes    Quit date: 03/19/1983  . Smokeless tobacco: Never Used  . Alcohol Use: 6.0 oz/week    10 Shots of liquor per week  . Drug Use: No  . Sexual Activity: Yes   Other Topics Concern  . Not on file   Social History Narrative   Married lives with his wife     BP 140/88 mmHg  Pulse 56  Ht '5\' 8"'$  (1.727 m)  Wt 182 lb 9.6 oz (82.827 kg)  BMI 27.77 kg/m2  SpO2 98%  Physical Exam:  Well appearing 80 year old man,NAD HEENT: Unremarkable Neck:  No JVD, no thyromegally Back:  No CVA tenderness Lungs:  Clear with no wheezes, rales, or rhonchi. HEART:  Regular brady rhythm, no murmurs, no rubs, no clicks Abd:  soft, positive bowel sounds, no organomegally, no rebound, no guarding Ext:  2 plus pulses, no edema, no cyanosis, no clubbing Skin:  No rashes no nodules Neuro:  CN II through XII intact, motor grossly intact  EKG - sinus bradycardia with left axis deviation   Assess/Plan:

## 2015-02-14 NOTE — Assessment & Plan Note (Signed)
He denies anginal symptoms. Will follow. 

## 2015-02-14 NOTE — Patient Instructions (Signed)

## 2015-02-25 ENCOUNTER — Encounter: Payer: Self-pay | Admitting: Internal Medicine

## 2015-02-25 ENCOUNTER — Ambulatory Visit (INDEPENDENT_AMBULATORY_CARE_PROVIDER_SITE_OTHER): Payer: Medicare Other | Admitting: Internal Medicine

## 2015-02-25 VITALS — BP 140/88 | HR 57 | Temp 97.8°F | Wt 180.0 lb

## 2015-02-25 DIAGNOSIS — I480 Paroxysmal atrial fibrillation: Secondary | ICD-10-CM | POA: Diagnosis not present

## 2015-02-25 DIAGNOSIS — H8111 Benign paroxysmal vertigo, right ear: Secondary | ICD-10-CM

## 2015-02-25 DIAGNOSIS — I10 Essential (primary) hypertension: Secondary | ICD-10-CM | POA: Diagnosis not present

## 2015-02-25 DIAGNOSIS — H8110 Benign paroxysmal vertigo, unspecified ear: Secondary | ICD-10-CM | POA: Insufficient documentation

## 2015-02-25 MED ORDER — ONDANSETRON HCL 4 MG PO TABS: 4.0000 mg | ORAL_TABLET | Freq: Three times a day (TID) | ORAL | Status: DC | PRN

## 2015-02-25 MED ORDER — DIAZEPAM 5 MG PO TABS: 5.0000 mg | ORAL_TABLET | Freq: Three times a day (TID) | ORAL | Status: DC | PRN

## 2015-02-25 NOTE — Progress Notes (Signed)
Subjective:  Patient ID: Omar Benson, male    DOB: 09/18/1934  Age: 80 y.o. MRN: [id number]  CC: No chief complaint on file.   HPI Omar Benson presents for dizziness 5 d - not better. No HA. No LOC. Has had it before. No h/o Menier's   Outpatient Prescriptions Prior to Visit  Medication Sig Dispense Refill  . albuterol (PROVENTIL HFA;VENTOLIN HFA) 108 (90 BASE) MCG/ACT inhaler Inhale 2 puffs into the lungs every 6 (six) hours as needed for wheezing or shortness of breath.    . allopurinol (ZYLOPRIM) 300 MG tablet Take 1 tablet (300 mg total) by mouth daily. (Patient taking differently: Take 300 mg by mouth at bedtime. ) 90 tablet 3  . amiodarone (PACERONE) 200 MG tablet Take 0.5 tablets (100 mg total) by mouth daily. 45 tablet 3  . aspirin EC 81 MG tablet Take 1 tablet (81 mg total) by mouth daily. (Patient taking differently: Take 162 mg by mouth daily. )    . b complex vitamins tablet Take 1 tablet by mouth daily.    . calcium elemental as carbonate (TUMS ULTRA 1000) 400 MG tablet Chew 1,000 mg by mouth daily as needed for heartburn.    . Cholecalciferol (VITAMIN D) 2000 UNITS CAPS Take 2,000 Units by mouth daily.     Marland Kitchen EPINEPHrine (EPIPEN) 0.3 mg/0.3 mL SOAJ injection Inject 0.3 mLs (0.3 mg total) into the muscle once. 2 Device 2  . hydrocortisone cream 0.5 % Apply 1 application topically daily.     Marland Kitchen levothyroxine (SYNTHROID, LEVOTHROID) 125 MCG tablet Take 1 tablet (125 mcg total) by mouth daily. 90 tablet 3  . MAGNESIUM PO Take 1 tablet by mouth daily.     . Multiple Vitamin (MULTIVITAMIN) tablet Take 1 tablet by mouth at bedtime.     . nitroGLYCERIN (NITROSTAT) 0.4 MG SL tablet Place 1 tablet (0.4 mg total) under the tongue every 5 (five) minutes as needed for chest pain. 25 tablet 5  . pantoprazole (PROTONIX) 40 MG tablet Take 1 tablet (40 mg total) by mouth daily. (Patient taking differently: Take 40 mg by mouth at bedtime. ) 90 tablet 3  . tiotropium (SPIRIVA HANDIHALER) 18 MCG  inhalation capsule PLACE 1 CAPSULE (18 MCG TOTAL) INTO INHALER AND INHALE DAILY. 90 capsule 3  . vardenafil (LEVITRA) 20 MG tablet Take 1 tablet (20 mg total) by mouth as needed for erectile dysfunction. 10 tablet 11  . amoxicillin-clavulanate (AUGMENTIN) 875-125 MG per tablet Take 1 tablet by mouth every 12 (twelve) hours. 14 tablet 0   No facility-administered medications prior to visit.    ROS Review of Systems  Constitutional: Negative for appetite change, fatigue and unexpected weight change.  HENT: Positive for hearing loss. Negative for congestion, nosebleeds, sneezing, sore throat and trouble swallowing.   Eyes: Negative for itching and visual disturbance.  Respiratory: Negative for cough.   Cardiovascular: Negative for chest pain, palpitations and leg swelling.  Gastrointestinal: Negative for nausea, diarrhea, blood in stool and abdominal distention.  Genitourinary: Negative for frequency and hematuria.  Musculoskeletal: Negative for back pain, joint swelling, gait problem and neck pain.  Skin: Negative for rash.  Neurological: Positive for dizziness. Negative for tremors, speech difficulty, weakness and light-headedness.  Psychiatric/Behavioral: Negative for sleep disturbance, dysphoric mood, decreased concentration and agitation. The patient is not nervous/anxious.     Objective:  BP 140/88 mmHg  Pulse 57  Temp(Src) 97.8 F (36.6 C) (Oral)  Wt 180 lb (81.647 kg)  SpO2 96%  BP Readings from Last 3 Encounters:  02/25/15 140/88  02/14/15 140/88  02/12/15 154/70    Wt Readings from Last 3 Encounters:  02/25/15 180 lb (81.647 kg)  02/14/15 182 lb 9.6 oz (82.827 kg)  02/11/15 181 lb (82.101 kg)    Physical Exam  Constitutional: He is oriented to person, place, and time. He appears well-developed. No distress.  NAD  HENT:  Mouth/Throat: Oropharynx is clear and moist.  Eyes: Conjunctivae are normal. Pupils are equal, round, and reactive to light.  Neck: Normal range  of motion. No JVD present. No thyromegaly present.  Cardiovascular: Normal rate, regular rhythm, normal heart sounds and intact distal pulses.  Exam reveals no gallop and no friction rub.   No murmur heard. Pulmonary/Chest: Effort normal and breath sounds normal. No respiratory distress. He has no wheezes. He has no rales. He exhibits no tenderness.  Abdominal: Soft. Bowel sounds are normal. He exhibits no distension and no mass. There is no tenderness. There is no rebound and no guarding.  Musculoskeletal: Normal range of motion. He exhibits no edema or tenderness.  Lymphadenopathy:    He has no cervical adenopathy.  Neurological: He is alert and oriented to person, place, and time. He has normal reflexes. No cranial nerve deficit. He exhibits normal muscle tone. He displays a negative Romberg sign. Coordination and gait normal.  Skin: Skin is warm and dry. No rash noted.  Psychiatric: He has a normal mood and affect. His behavior is normal. Judgment and thought content normal.  H-P (+/-) on R  Lab Results  Component Value Date   WBC 4.5 02/11/2015   HGB 14.5 02/11/2015   HCT 43.2 02/11/2015   PLT 187 02/11/2015   GLUCOSE 108* 02/11/2015   CHOL 179 12/26/2013   TRIG 240.0* 12/26/2013   HDL 37.30* 12/26/2013   LDLDIRECT 104.5 12/26/2013   LDLCALC 83 09/15/2012   ALT 18 02/11/2015   AST 21 02/11/2015   NA 141 02/11/2015   K 4.9 02/11/2015   CL 110 02/11/2015   CREATININE 1.58* 02/11/2015   BUN 14 02/11/2015   CO2 23 02/11/2015   TSH 0.864 02/11/2015   PSA 1.76 12/26/2013   INR 1.37 02/12/2014   HGBA1C 5.4 05/31/2011    Dg Chest Portable 1 View  02/12/2015   CLINICAL DATA:  Atrial fibrillation  EXAM: PORTABLE CHEST 1 VIEW  COMPARISON:  11/23/2012  FINDINGS: Status post CABG with stable mild cardiomegaly. Stable aortic tortuosity.  Mild atelectasis or scarring, greater on the left. There is no edema, consolidation, effusion, or pneumothorax. No acute osseous finding.  IMPRESSION:  No acute finding.   Electronically Signed   By: Monte Fantasia M.D.   On: 02/12/2015 00:26    Assessment & Plan:   Diagnoses and all orders for this visit:  Benign paroxysmal positional vertigo, right  Other orders -     diazepam (VALIUM) 5 MG tablet; Take 1 tablet (5 mg total) by mouth every 8 (eight) hours as needed (vertigo). -     ondansetron (ZOFRAN) 4 MG tablet; Take 1 tablet (4 mg total) by mouth every 8 (eight) hours as needed for nausea or vomiting.   I have discontinued Mr. Tamblyn amoxicillin-clavulanate. I am also having him start on diazepam and ondansetron. Additionally, I am having him maintain his multivitamin, EPINEPHrine, hydrocortisone cream, calcium elemental as carbonate, aspirin EC, allopurinol, levothyroxine, nitroGLYCERIN, pantoprazole, tiotropium, vardenafil, albuterol, Vitamin D, b complex vitamins, MAGNESIUM PO, and amiodarone.  Meds ordered this encounter  Medications  .  diazepam (VALIUM) 5 MG tablet    Sig: Take 1 tablet (5 mg total) by mouth every 8 (eight) hours as needed (vertigo).    Dispense:  60 tablet    Refill:  1  . ondansetron (ZOFRAN) 4 MG tablet    Sig: Take 1 tablet (4 mg total) by mouth every 8 (eight) hours as needed for nausea or vomiting.    Dispense:  20 tablet    Refill:  0     Follow-up: Return for a follow-up visit.  Walker Kehr, MD

## 2015-02-25 NOTE — Progress Notes (Signed)
Pre visit review using our clinic review tool, if applicable. No additional management support is needed unless otherwise documented below in the visit note. 

## 2015-02-25 NOTE — Patient Instructions (Signed)
Benign Positional Vertigo symptoms on the right Start Brandt - Daroff exercise several times a day as dirrected.  

## 2015-02-26 NOTE — Assessment & Plan Note (Signed)
BPV most likely (R) Pt declined steroids, other meds ENT ref if not better B-D exercises

## 2015-02-26 NOTE — Assessment & Plan Note (Signed)
On Amiodarone

## 2015-02-26 NOTE — Assessment & Plan Note (Signed)
Not on Rx 

## 2015-03-21 ENCOUNTER — Other Ambulatory Visit: Payer: Self-pay | Admitting: Internal Medicine

## 2015-03-21 DIAGNOSIS — I714 Abdominal aortic aneurysm, without rupture, unspecified: Secondary | ICD-10-CM

## 2015-03-27 ENCOUNTER — Ambulatory Visit (HOSPITAL_COMMUNITY)
Admission: RE | Admit: 2015-03-27 | Discharge: 2015-03-27 | Disposition: A | Discharge status: Home / Self Care | Payer: Medicare Other | Source: Ambulatory Visit | Attending: Internal Medicine | Admitting: Internal Medicine

## 2015-03-27 DIAGNOSIS — I714 Abdominal aortic aneurysm, without rupture, unspecified: Secondary | ICD-10-CM

## 2015-04-02 DIAGNOSIS — D1801 Hemangioma of skin and subcutaneous tissue: Secondary | ICD-10-CM | POA: Diagnosis not present

## 2015-04-02 DIAGNOSIS — L219 Seborrheic dermatitis, unspecified: Secondary | ICD-10-CM | POA: Diagnosis not present

## 2015-04-02 DIAGNOSIS — B353 Tinea pedis: Secondary | ICD-10-CM | POA: Diagnosis not present

## 2015-04-02 DIAGNOSIS — L821 Other seborrheic keratosis: Secondary | ICD-10-CM | POA: Diagnosis not present

## 2015-04-02 DIAGNOSIS — L723 Sebaceous cyst: Secondary | ICD-10-CM | POA: Diagnosis not present

## 2015-04-02 DIAGNOSIS — D225 Melanocytic nevi of trunk: Secondary | ICD-10-CM | POA: Diagnosis not present

## 2015-04-02 DIAGNOSIS — L570 Actinic keratosis: Secondary | ICD-10-CM | POA: Diagnosis not present

## 2015-04-02 DIAGNOSIS — L814 Other melanin hyperpigmentation: Secondary | ICD-10-CM | POA: Diagnosis not present

## 2015-04-22 ENCOUNTER — Other Ambulatory Visit (INDEPENDENT_AMBULATORY_CARE_PROVIDER_SITE_OTHER): Payer: Medicare Other

## 2015-04-22 ENCOUNTER — Ambulatory Visit (INDEPENDENT_AMBULATORY_CARE_PROVIDER_SITE_OTHER): Payer: Medicare Other | Admitting: Internal Medicine

## 2015-04-22 ENCOUNTER — Encounter: Payer: Self-pay | Admitting: Internal Medicine

## 2015-04-22 VITALS — BP 178/88 | HR 50 | Temp 98.0°F | Wt 182.0 lb

## 2015-04-22 DIAGNOSIS — R1901 Right upper quadrant abdominal swelling, mass and lump: Secondary | ICD-10-CM

## 2015-04-22 DIAGNOSIS — N183 Chronic kidney disease, stage 3 (moderate): Secondary | ICD-10-CM | POA: Diagnosis not present

## 2015-04-22 LAB — URINALYSIS
Bilirubin Urine: NEGATIVE
HGB URINE DIPSTICK: NEGATIVE
Ketones, ur: NEGATIVE
Leukocytes, UA: NEGATIVE
Nitrite: NEGATIVE
Specific Gravity, Urine: 1.025 (ref 1.000–1.030)
Total Protein, Urine: NEGATIVE
URINE GLUCOSE: NEGATIVE
Urobilinogen, UA: 0.2 (ref 0.0–1.0)
pH: 5.5 (ref 5.0–8.0)

## 2015-04-22 LAB — CBC WITH DIFFERENTIAL/PLATELET
BASOS PCT: 0.5 % (ref 0.0–3.0)
Basophils Absolute: 0 10*3/uL (ref 0.0–0.1)
EOS PCT: 4.8 % (ref 0.0–5.0)
Eosinophils Absolute: 0.2 10*3/uL (ref 0.0–0.7)
HEMATOCRIT: 44.3 % (ref 39.0–52.0)
Hemoglobin: 14.7 g/dL (ref 13.0–17.0)
LYMPHS ABS: 1 10*3/uL (ref 0.7–4.0)
LYMPHS PCT: 24 % (ref 12.0–46.0)
MCHC: 33.1 g/dL (ref 30.0–36.0)
MCV: 95.8 fl (ref 78.0–100.0)
MONOS PCT: 11 % (ref 3.0–12.0)
Monocytes Absolute: 0.5 10*3/uL (ref 0.1–1.0)
NEUTROS ABS: 2.6 10*3/uL (ref 1.4–7.7)
Neutrophils Relative %: 59.7 % (ref 43.0–77.0)
PLATELETS: 207 10*3/uL (ref 150.0–400.0)
RBC: 4.63 Mil/uL (ref 4.22–5.81)
RDW: 14.7 % (ref 11.5–15.5)
WBC: 4.3 10*3/uL (ref 4.0–10.5)

## 2015-04-22 LAB — HEPATIC FUNCTION PANEL
ALT: 14 U/L (ref 0–53)
AST: 16 U/L (ref 0–37)
Albumin: 3.9 g/dL (ref 3.5–5.2)
Alkaline Phosphatase: 111 U/L (ref 39–117)
BILIRUBIN TOTAL: 0.6 mg/dL (ref 0.2–1.2)
Bilirubin, Direct: 0.1 mg/dL (ref 0.0–0.3)
Total Protein: 7.3 g/dL (ref 6.0–8.3)

## 2015-04-22 LAB — BASIC METABOLIC PANEL
BUN: 19 mg/dL (ref 6–23)
CALCIUM: 9.7 mg/dL (ref 8.4–10.5)
CHLORIDE: 106 meq/L (ref 96–112)
CO2: 27 mEq/L (ref 19–32)
CREATININE: 1.54 mg/dL — AB (ref 0.40–1.50)
GFR: 46.6 mL/min — AB (ref >60.00)
Glucose, Bld: 109 mg/dL — ABNORMAL HIGH (ref 70–99)
Potassium: 5 mEq/L (ref 3.5–5.1)
Sodium: 139 mEq/L (ref 135–145)

## 2015-04-22 NOTE — Assessment & Plan Note (Signed)
BMET to see if CT would be safe to do

## 2015-04-22 NOTE — Progress Notes (Signed)
Subjective:  Patient ID: Omar Benson, male    DOB: [birth year]  Age: 80 y.o. MRN: [id number]  CC: No chief complaint on file.   HPI Omar Benson presents for a new mass in the RUQ that the pt noticed a 1-2 weeks ago.  Outpatient Prescriptions Prior to Visit  Medication Sig Dispense Refill  . albuterol (PROVENTIL HFA;VENTOLIN HFA) 108 (90 BASE) MCG/ACT inhaler Inhale 2 puffs into the lungs every 6 (six) hours as needed for wheezing or shortness of breath.    . allopurinol (ZYLOPRIM) 300 MG tablet Take 1 tablet (300 mg total) by mouth daily. (Patient taking differently: Take 300 mg by mouth at bedtime. ) 90 tablet 3  . amiodarone (PACERONE) 200 MG tablet Take 0.5 tablets (100 mg total) by mouth daily. 45 tablet 3  . aspirin EC 81 MG tablet Take 1 tablet (81 mg total) by mouth daily. (Patient taking differently: Take 162 mg by mouth daily. )    . b complex vitamins tablet Take 1 tablet by mouth daily.    . calcium elemental as carbonate (TUMS ULTRA 1000) 400 MG tablet Chew 1,000 mg by mouth daily as needed for heartburn.    . Cholecalciferol (VITAMIN D) 2000 UNITS CAPS Take 2,000 Units by mouth daily.     . diazepam (VALIUM) 5 MG tablet Take 1 tablet (5 mg total) by mouth every 8 (eight) hours as needed (vertigo). 60 tablet 1  . EPINEPHrine (EPIPEN) 0.3 mg/0.3 mL SOAJ injection Inject 0.3 mLs (0.3 mg total) into the muscle once. 2 Device 2  . hydrocortisone cream 0.5 % Apply 1 application topically daily.     Marland Kitchen levothyroxine (SYNTHROID, LEVOTHROID) 125 MCG tablet Take 1 tablet (125 mcg total) by mouth daily. 90 tablet 3  . MAGNESIUM PO Take 1 tablet by mouth daily.     . Multiple Vitamin (MULTIVITAMIN) tablet Take 1 tablet by mouth at bedtime.     . nitroGLYCERIN (NITROSTAT) 0.4 MG SL tablet Place 1 tablet (0.4 mg total) under the tongue every 5 (five) minutes as needed for chest pain. 25 tablet 5  . ondansetron (ZOFRAN) 4 MG tablet Take 1 tablet (4 mg total) by mouth every 8 (eight) hours as  needed for nausea or vomiting. 20 tablet 0  . pantoprazole (PROTONIX) 40 MG tablet Take 1 tablet (40 mg total) by mouth daily. (Patient taking differently: Take 40 mg by mouth at bedtime. ) 90 tablet 3  . tiotropium (SPIRIVA HANDIHALER) 18 MCG inhalation capsule PLACE 1 CAPSULE (18 MCG TOTAL) INTO INHALER AND INHALE DAILY. 90 capsule 3  . vardenafil (LEVITRA) 20 MG tablet Take 1 tablet (20 mg total) by mouth as needed for erectile dysfunction. 10 tablet 11   No facility-administered medications prior to visit.    ROS Review of Systems  Constitutional: Negative for appetite change, fatigue and unexpected weight change.  HENT: Negative for congestion, nosebleeds, sneezing, sore throat and trouble swallowing.   Eyes: Negative for itching and visual disturbance.  Respiratory: Negative for cough.   Cardiovascular: Negative for chest pain, palpitations and leg swelling.  Gastrointestinal: Positive for abdominal pain and abdominal distention. Negative for nausea, diarrhea and blood in stool.  Genitourinary: Negative for frequency and hematuria.  Musculoskeletal: Negative for back pain, joint swelling, gait problem and neck pain.  Skin: Negative for rash.  Neurological: Negative for dizziness, tremors, speech difficulty and weakness.  Psychiatric/Behavioral: Negative for sleep disturbance, dysphoric mood and agitation. The patient is not nervous/anxious.  Objective:  BP 178/88 mmHg  Pulse 50  Temp(Src) 98 F (36.7 C) (Oral)  Wt 182 lb (82.555 kg)  SpO2 96%  BP Readings from Last 3 Encounters:  04/22/15 178/88  02/25/15 140/88  02/14/15 140/88    Wt Readings from Last 3 Encounters:  04/22/15 182 lb (82.555 kg)  02/25/15 180 lb (81.647 kg)  02/14/15 182 lb 9.6 oz (82.827 kg)    Physical Exam  Constitutional: He is oriented to person, place, and time. He appears well-developed. No distress.  NAD  HENT:  Mouth/Throat: Oropharynx is clear and moist.  Eyes: Conjunctivae are  normal. Pupils are equal, round, and reactive to light.  Neck: Normal range of motion. No JVD present. No thyromegaly present.  Cardiovascular: Normal rate, regular rhythm, normal heart sounds and intact distal pulses.  Exam reveals no gallop and no friction rub.   No murmur heard. Pulmonary/Chest: Effort normal and breath sounds normal. No respiratory distress. He has no wheezes. He has no rales. He exhibits no tenderness.  Abdominal: Soft. Bowel sounds are normal. He exhibits mass. He exhibits no distension. There is no tenderness. There is no rebound and no guarding.  Musculoskeletal: Normal range of motion. He exhibits no edema or tenderness.  Lymphadenopathy:    He has no cervical adenopathy.  Neurological: He is alert and oriented to person, place, and time. He has normal reflexes. No cranial nerve deficit. He exhibits normal muscle tone. He displays a negative Romberg sign. Coordination and gait normal.  Skin: Skin is warm and dry. No rash noted.  Psychiatric: He has a normal mood and affect. His behavior is normal. Judgment and thought content normal.  a 20 cm bulge in the RUQ - sensitive. No HSM  Lab Results  Component Value Date   WBC 4.5 02/11/2015   HGB 14.5 02/11/2015   HCT 43.2 02/11/2015   PLT 187 02/11/2015   GLUCOSE 108* 02/11/2015   CHOL 179 12/26/2013   TRIG 240.0* 12/26/2013   HDL 37.30* 12/26/2013   LDLDIRECT 104.5 12/26/2013   LDLCALC 83 09/15/2012   ALT 18 02/11/2015   AST 21 02/11/2015   NA 141 02/11/2015   K 4.9 02/11/2015   CL 110 02/11/2015   CREATININE 1.58* 02/11/2015   BUN 14 02/11/2015   CO2 23 02/11/2015   TSH 0.864 02/11/2015   PSA 1.76 12/26/2013   INR 1.37 02/12/2014   HGBA1C 5.4 05/31/2011    No results found.  Assessment & Plan:   There are no diagnoses linked to this encounter. I am having Omar Benson maintain his multivitamin, EPINEPHrine, hydrocortisone cream, calcium elemental as carbonate, aspirin EC, allopurinol, levothyroxine,  nitroGLYCERIN, pantoprazole, tiotropium, vardenafil, albuterol, Vitamin D, b complex vitamins, MAGNESIUM PO, amiodarone, diazepam, and ondansetron.  No orders of the defined types were placed in this encounter.     Follow-up: No Follow-up on file.  Walker Kehr, MD

## 2015-04-22 NOTE — Assessment & Plan Note (Addendum)
12/16 new ?etiology Diagn options discussed: CT vs [place]. Elev creat may influence the test choice CT abd ordered Labs

## 2015-04-22 NOTE — Progress Notes (Signed)
Pre visit review using our clinic review tool, if applicable. No additional management support is needed unless otherwise documented below in the visit note. 

## 2015-05-09 ENCOUNTER — Ambulatory Visit
Admission: RE | Admit: 2015-05-09 | Discharge: 2015-05-09 | Disposition: A | Discharge status: Home / Self Care | Payer: Medicare Other | Source: Ambulatory Visit | Attending: Internal Medicine | Admitting: Internal Medicine

## 2015-05-09 DIAGNOSIS — K5900 Constipation, unspecified: Secondary | ICD-10-CM | POA: Diagnosis not present

## 2015-05-09 DIAGNOSIS — R1011 Right upper quadrant pain: Secondary | ICD-10-CM | POA: Diagnosis not present

## 2015-05-09 MED ORDER — IOPAMIDOL (ISOVUE-300) INJECTION 61%
75.0000 mL | Freq: Once | INTRAVENOUS | Status: AC | PRN
  Administered 2015-05-09: 75 mL via INTRAVENOUS

## 2015-05-19 DIAGNOSIS — I200 Unstable angina: Secondary | ICD-10-CM

## 2015-05-19 HISTORY — DX: Unstable angina: I20.0

## 2015-05-21 ENCOUNTER — Other Ambulatory Visit: Payer: Self-pay | Admitting: Internal Medicine

## 2015-06-19 ENCOUNTER — Encounter (HOSPITAL_COMMUNITY): Payer: Self-pay | Admitting: Emergency Medicine

## 2015-06-19 ENCOUNTER — Emergency Department (HOSPITAL_COMMUNITY): Payer: Medicare Other

## 2015-06-19 ENCOUNTER — Inpatient Hospital Stay (HOSPITAL_COMMUNITY)
Admission: EM | Admit: 2015-06-19 | Discharge: 2015-06-21 | DRG: 282 | Disposition: A | Discharge status: Home / Self Care | Payer: Medicare Other | Attending: Interventional Cardiology | Admitting: Interventional Cardiology

## 2015-06-19 DIAGNOSIS — Z87891 Personal history of nicotine dependence: Secondary | ICD-10-CM | POA: Diagnosis not present

## 2015-06-19 DIAGNOSIS — E039 Hypothyroidism, unspecified: Secondary | ICD-10-CM | POA: Diagnosis present

## 2015-06-19 DIAGNOSIS — I2510 Atherosclerotic heart disease of native coronary artery without angina pectoris: Secondary | ICD-10-CM | POA: Diagnosis present

## 2015-06-19 DIAGNOSIS — I252 Old myocardial infarction: Secondary | ICD-10-CM | POA: Diagnosis not present

## 2015-06-19 DIAGNOSIS — K219 Gastro-esophageal reflux disease without esophagitis: Secondary | ICD-10-CM | POA: Diagnosis present

## 2015-06-19 DIAGNOSIS — M109 Gout, unspecified: Secondary | ICD-10-CM | POA: Diagnosis present

## 2015-06-19 DIAGNOSIS — Z79899 Other long term (current) drug therapy: Secondary | ICD-10-CM | POA: Diagnosis not present

## 2015-06-19 DIAGNOSIS — R079 Chest pain, unspecified: Secondary | ICD-10-CM | POA: Diagnosis not present

## 2015-06-19 DIAGNOSIS — I25110 Atherosclerotic heart disease of native coronary artery with unstable angina pectoris: Secondary | ICD-10-CM | POA: Diagnosis present

## 2015-06-19 DIAGNOSIS — I129 Hypertensive chronic kidney disease with stage 1 through stage 4 chronic kidney disease, or unspecified chronic kidney disease: Secondary | ICD-10-CM | POA: Diagnosis present

## 2015-06-19 DIAGNOSIS — N183 Chronic kidney disease, stage 3 unspecified: Secondary | ICD-10-CM | POA: Diagnosis present

## 2015-06-19 DIAGNOSIS — I200 Unstable angina: Secondary | ICD-10-CM | POA: Diagnosis present

## 2015-06-19 DIAGNOSIS — E785 Hyperlipidemia, unspecified: Secondary | ICD-10-CM | POA: Diagnosis present

## 2015-06-19 DIAGNOSIS — F419 Anxiety disorder, unspecified: Secondary | ICD-10-CM | POA: Diagnosis present

## 2015-06-19 DIAGNOSIS — I4510 Unspecified right bundle-branch block: Secondary | ICD-10-CM | POA: Diagnosis present

## 2015-06-19 DIAGNOSIS — I714 Abdominal aortic aneurysm, without rupture, unspecified: Secondary | ICD-10-CM | POA: Diagnosis present

## 2015-06-19 DIAGNOSIS — Z7982 Long term (current) use of aspirin: Secondary | ICD-10-CM

## 2015-06-19 DIAGNOSIS — I739 Peripheral vascular disease, unspecified: Secondary | ICD-10-CM | POA: Diagnosis present

## 2015-06-19 DIAGNOSIS — R001 Bradycardia, unspecified: Secondary | ICD-10-CM | POA: Diagnosis present

## 2015-06-19 DIAGNOSIS — I214 Non-ST elevation (NSTEMI) myocardial infarction: Secondary | ICD-10-CM | POA: Diagnosis not present

## 2015-06-19 DIAGNOSIS — M1990 Unspecified osteoarthritis, unspecified site: Secondary | ICD-10-CM | POA: Diagnosis present

## 2015-06-19 DIAGNOSIS — J449 Chronic obstructive pulmonary disease, unspecified: Secondary | ICD-10-CM | POA: Diagnosis present

## 2015-06-19 DIAGNOSIS — I480 Paroxysmal atrial fibrillation: Secondary | ICD-10-CM | POA: Diagnosis present

## 2015-06-19 DIAGNOSIS — I10 Essential (primary) hypertension: Secondary | ICD-10-CM | POA: Diagnosis present

## 2015-06-19 DIAGNOSIS — M549 Dorsalgia, unspecified: Secondary | ICD-10-CM

## 2015-06-19 DIAGNOSIS — Z951 Presence of aortocoronary bypass graft: Secondary | ICD-10-CM | POA: Diagnosis not present

## 2015-06-19 DIAGNOSIS — I723 Aneurysm of iliac artery: Secondary | ICD-10-CM | POA: Diagnosis not present

## 2015-06-19 HISTORY — DX: Unstable angina: I20.0

## 2015-06-19 LAB — T4, FREE: FREE T4: 1.63 ng/dL — AB (ref 0.61–1.12)

## 2015-06-19 LAB — I-STAT TROPONIN, ED
TROPONIN I, POC: 0 ng/mL (ref 0.00–0.08)
Troponin i, poc: 0 ng/mL (ref 0.00–0.08)

## 2015-06-19 LAB — CBC WITH DIFFERENTIAL/PLATELET
BASOS ABS: 0 10*3/uL (ref 0.0–0.1)
Basophils Relative: 1 %
EOS ABS: 0.4 10*3/uL (ref 0.0–0.7)
Eosinophils Relative: 8 %
HCT: 43.5 % (ref 39.0–52.0)
HEMOGLOBIN: 14.7 g/dL (ref 13.0–17.0)
LYMPHS PCT: 29 %
Lymphs Abs: 1.3 10*3/uL (ref 0.7–4.0)
MCH: 32.3 pg (ref 26.0–34.0)
MCHC: 33.8 g/dL (ref 30.0–36.0)
MCV: 95.6 fL (ref 78.0–100.0)
Monocytes Absolute: 0.5 10*3/uL (ref 0.1–1.0)
Monocytes Relative: 11 %
NEUTROS PCT: 51 %
Neutro Abs: 2.4 10*3/uL (ref 1.7–7.7)
Platelets: 182 10*3/uL (ref 150–400)
RBC: 4.55 MIL/uL (ref 4.22–5.81)
RDW: 13.9 % (ref 11.5–15.5)
WBC: 4.6 10*3/uL (ref 4.0–10.5)

## 2015-06-19 LAB — CBC
HCT: 43.3 % (ref 39.0–52.0)
HEMOGLOBIN: 14.9 g/dL (ref 13.0–17.0)
MCH: 33 pg (ref 26.0–34.0)
MCHC: 34.4 g/dL (ref 30.0–36.0)
MCV: 95.8 fL (ref 78.0–100.0)
Platelets: 196 10*3/uL (ref 150–400)
RBC: 4.52 MIL/uL (ref 4.22–5.81)
RDW: 14 % (ref 11.5–15.5)
WBC: 5.4 10*3/uL (ref 4.0–10.5)

## 2015-06-19 LAB — TROPONIN I
TROPONIN I: 0.38 ng/mL — AB (ref <0.031)
TROPONIN I: 1.74 ng/mL — AB (ref <0.031)

## 2015-06-19 LAB — BASIC METABOLIC PANEL
Anion gap: 11 (ref 5–15)
BUN: 21 mg/dL — ABNORMAL HIGH (ref 6–20)
CHLORIDE: 107 mmol/L (ref 101–111)
CO2: 22 mmol/L (ref 22–32)
Calcium: 9.7 mg/dL (ref 8.9–10.3)
Creatinine, Ser: 1.84 mg/dL — ABNORMAL HIGH (ref 0.61–1.24)
GFR calc non Af Amer: 33 mL/min — ABNORMAL LOW (ref >60)
GFR, EST AFRICAN AMERICAN: 39 mL/min — AB (ref >60)
GLUCOSE: 153 mg/dL — AB (ref 65–99)
Potassium: 4.6 mmol/L (ref 3.5–5.1)
SODIUM: 140 mmol/L (ref 135–145)

## 2015-06-19 LAB — CREATININE, SERUM
CREATININE: 1.8 mg/dL — AB (ref 0.61–1.24)
GFR calc non Af Amer: 34 mL/min — ABNORMAL LOW (ref >60)
GFR, EST AFRICAN AMERICAN: 40 mL/min — AB (ref >60)

## 2015-06-19 LAB — TSH: TSH: 1.654 u[IU]/mL (ref 0.350–4.500)

## 2015-06-19 LAB — HEPATIC FUNCTION PANEL
ALT: 18 U/L (ref 17–63)
AST: 22 U/L (ref 15–41)
Albumin: 3.9 g/dL (ref 3.5–5.0)
Alkaline Phosphatase: 112 U/L (ref 38–126)
BILIRUBIN INDIRECT: 0.6 mg/dL (ref 0.3–0.9)
Bilirubin, Direct: 0.2 mg/dL (ref 0.1–0.5)
TOTAL PROTEIN: 7.3 g/dL (ref 6.5–8.1)
Total Bilirubin: 0.8 mg/dL (ref 0.3–1.2)

## 2015-06-19 LAB — MAGNESIUM: MAGNESIUM: 2.1 mg/dL (ref 1.7–2.4)

## 2015-06-19 MED ORDER — AMLODIPINE BESYLATE 5 MG PO TABS: 10.0000 mg | ORAL_TABLET | Freq: Every day | ORAL | Status: DC

## 2015-06-19 MED ORDER — HEPARIN (PORCINE) IN NACL 100-0.45 UNIT/ML-% IJ SOLN
1000.0000 [IU]/h | INTRAMUSCULAR | Status: DC
  Administered 2015-06-20: 1000 [IU]/h via INTRAVENOUS
  Filled 2015-06-19: qty 250

## 2015-06-19 MED ORDER — ALBUTEROL SULFATE HFA 108 (90 BASE) MCG/ACT IN AERS: 2.0000 | INHALATION_SPRAY | Freq: Four times a day (QID) | RESPIRATORY_TRACT | Status: DC | PRN

## 2015-06-19 MED ORDER — LEVOTHYROXINE SODIUM 25 MCG PO TABS
125.0000 ug | ORAL_TABLET | Freq: Every day | ORAL | Status: DC
  Administered 2015-06-20 – 2015-06-21 (×2): 125 ug via ORAL
  Filled 2015-06-19 (×2): qty 1

## 2015-06-19 MED ORDER — TIOTROPIUM BROMIDE MONOHYDRATE 18 MCG IN CAPS
18.0000 ug | ORAL_CAPSULE | Freq: Every day | RESPIRATORY_TRACT | Status: DC
Start: 2015-06-20 — End: 2015-06-21
  Administered 2015-06-20 – 2015-06-21 (×2): 18 ug via RESPIRATORY_TRACT
  Filled 2015-06-19: qty 5

## 2015-06-19 MED ORDER — NITROGLYCERIN 0.4 MG SL SUBL
0.4000 mg | SUBLINGUAL_TABLET | SUBLINGUAL | Status: DC | PRN
Start: 2015-06-19 — End: 2015-06-21

## 2015-06-19 MED ORDER — ACETAMINOPHEN 325 MG PO TABS
650.0000 mg | ORAL_TABLET | ORAL | Status: DC | PRN
  Administered 2015-06-20 – 2015-06-21 (×4): 650 mg via ORAL
  Filled 2015-06-19 (×4): qty 2

## 2015-06-19 MED ORDER — PANTOPRAZOLE SODIUM 40 MG PO TBEC
40.0000 mg | DELAYED_RELEASE_TABLET | Freq: Every day | ORAL | Status: DC
  Administered 2015-06-19 – 2015-06-21 (×3): 40 mg via ORAL
  Filled 2015-06-19 (×2): qty 1

## 2015-06-19 MED ORDER — HEPARIN SODIUM (PORCINE) 5000 UNIT/ML IJ SOLN
5000.0000 [IU] | Freq: Three times a day (TID) | INTRAMUSCULAR | Status: DC
  Administered 2015-06-19 (×2): 5000 [IU] via SUBCUTANEOUS
  Filled 2015-06-19 (×2): qty 1

## 2015-06-19 MED ORDER — PRAVASTATIN SODIUM 20 MG PO TABS
20.0000 mg | ORAL_TABLET | Freq: Every day | ORAL | Status: DC
  Filled 2015-06-19 (×3): qty 1

## 2015-06-19 MED ORDER — SIMVASTATIN 10 MG PO TABS: 10.0000 mg | ORAL_TABLET | Freq: Every day | ORAL | Status: DC

## 2015-06-19 MED ORDER — VITAMIN D 1000 UNITS PO TABS
2000.0000 [IU] | ORAL_TABLET | Freq: Every day | ORAL | Status: DC
  Administered 2015-06-20 – 2015-06-21 (×2): 2000 [IU] via ORAL
  Filled 2015-06-19 (×3): qty 2

## 2015-06-19 MED ORDER — MORPHINE SULFATE (PF) 4 MG/ML IV SOLN
4.0000 mg | Freq: Once | INTRAVENOUS | Status: AC
  Administered 2015-06-19: 4 mg via INTRAVENOUS
  Filled 2015-06-19: qty 1

## 2015-06-19 MED ORDER — SODIUM CHLORIDE 0.9 % IV SOLN
INTRAVENOUS | Status: DC
  Administered 2015-06-19: 10 mL/h via INTRAVENOUS

## 2015-06-19 MED ORDER — AMIODARONE HCL 200 MG PO TABS
100.0000 mg | ORAL_TABLET | Freq: Every day | ORAL | Status: DC
Start: 2015-06-19 — End: 2015-06-21
  Administered 2015-06-19 – 2015-06-21 (×3): 100 mg via ORAL
  Filled 2015-06-19 (×3): qty 1

## 2015-06-19 MED ORDER — HEPARIN BOLUS VIA INFUSION
2000.0000 [IU] | Freq: Once | INTRAVENOUS | Status: AC
  Administered 2015-06-20: 2000 [IU] via INTRAVENOUS
  Filled 2015-06-19: qty 2000

## 2015-06-19 MED ORDER — NITROGLYCERIN IN D5W 200-5 MCG/ML-% IV SOLN: 15.0000 ug/min | INTRAVENOUS | Status: DC

## 2015-06-19 MED ORDER — ALLOPURINOL 300 MG PO TABS
300.0000 mg | ORAL_TABLET | Freq: Every day | ORAL | Status: DC
  Administered 2015-06-19 – 2015-06-21 (×3): 300 mg via ORAL
  Filled 2015-06-19 (×2): qty 1

## 2015-06-19 MED ORDER — ALPRAZOLAM 0.25 MG PO TABS: 0.2500 mg | ORAL_TABLET | Freq: Two times a day (BID) | ORAL | Status: DC | PRN

## 2015-06-19 MED ORDER — ONDANSETRON HCL 4 MG/2ML IJ SOLN: 4.0000 mg | Freq: Four times a day (QID) | INTRAMUSCULAR | Status: DC | PRN

## 2015-06-19 MED ORDER — ADULT MULTIVITAMIN W/MINERALS CH
1.0000 | ORAL_TABLET | Freq: Every day | ORAL | Status: DC
  Administered 2015-06-19 – 2015-06-20 (×2): 1 via ORAL
  Filled 2015-06-19 (×3): qty 1

## 2015-06-19 MED ORDER — ASPIRIN EC 81 MG PO TBEC
81.0000 mg | DELAYED_RELEASE_TABLET | Freq: Every day | ORAL | Status: DC
  Administered 2015-06-20 – 2015-06-21 (×2): 81 mg via ORAL
  Filled 2015-06-19 (×2): qty 1

## 2015-06-19 MED ORDER — NITROGLYCERIN IN D5W 200-5 MCG/ML-% IV SOLN
5.0000 ug/min | INTRAVENOUS | Status: DC
  Administered 2015-06-19: 5 ug/min via INTRAVENOUS
  Filled 2015-06-19: qty 250

## 2015-06-19 MED ORDER — ALBUTEROL SULFATE (2.5 MG/3ML) 0.083% IN NEBU: 2.5000 mg | INHALATION_SOLUTION | Freq: Four times a day (QID) | RESPIRATORY_TRACT | Status: DC | PRN

## 2015-06-19 MED ORDER — IOHEXOL 350 MG/ML SOLN
80.0000 mL | Freq: Once | INTRAVENOUS | Status: AC | PRN
  Administered 2015-06-19: 80 mL via INTRAVENOUS

## 2015-06-19 MED ORDER — AMLODIPINE BESYLATE 5 MG PO TABS
5.0000 mg | ORAL_TABLET | Freq: Every day | ORAL | Status: DC
  Administered 2015-06-19 – 2015-06-21 (×3): 5 mg via ORAL
  Filled 2015-06-19 (×3): qty 1

## 2015-06-19 MED ORDER — NITROGLYCERIN 0.4 MG SL SUBL: 0.4000 mg | SUBLINGUAL_TABLET | SUBLINGUAL | Status: DC | PRN

## 2015-06-19 NOTE — ED Notes (Signed)
Pt reports new onset upper back pain at 1030 that he states is the same feeling he had when his heart attack in January 3-4 years ago. Pt also reports a tight feeling under his tongue. Pt reports nausea and weakness as well and report she feels worse than he did the last time. Pt is not on blood thinners but did take 4 81mg  ASA and 2 nitro with no relief to pain.

## 2015-06-19 NOTE — Progress Notes (Signed)
Patient arrived to the floor 1855 via stretcher w/ ED RN Baldo Ash. Leads attached to cardiac monitor, CCMD called to inform box 2W30. Alert, oriented, denies pain - IV of Nitroglycerin and NS infusing.

## 2015-06-19 NOTE — ED Notes (Signed)
Latanya Presser, RN calls to address pain level before PT can be brought to 2W. Pain level will be reviewed with PT and will call Bevely Palmer back

## 2015-06-19 NOTE — ED Provider Notes (Addendum)
CSN: [id number]     Arrival date & time 06/19/15  1116 History   First MD Initiated Contact with Patient 06/19/15 1223     Chief Complaint  Patient presents with  . Chest Pain   HPI Pt started having pain between his shoulder blade at 1030 while sitting at a desk.  He also has pain at the base of his tongue and his neck.  He took aspirin and ntg.  He also developed some nausea and weakness.  Pt has history of stemi.  This feel similar to prior episodes.  No shortness of breath.  No nausea.   Past Medical History  Diagnosis Date  . Hypothyroidism 12/05/2008  . HYPERLIPIDEMIA 09/19/2008  . GOUT 09/19/2008  . ANEMIA-IRON DEFICIENCY 09/19/2008  . HYPERTENSION 09/19/2008  . CAD 12/05/2008    s/p CABG; NSTEMI 05/2011 - LHC 05/25/11: LAD occluded, LIMA-LAD patent, ostial circumflex 50%, AV circumflex 70%, SVG-OM2 with extensive disease with thrombus (culprit vessel), RCA 80% and occluded, SVG-intermediate patent, SVG-PDA/PL A patent.  Given that the culprit vessel was a subtotally occluded heavily thrombotic graft to a smaller OM2, medical therapy was recommended.;   . Atrial fibrillation ([place]) 04/19/2008    amiodarone rx;  Echocardiogram 05/26/11: No wall motion abnormalities, mild LVH, EF 60%.  . Atrial flutter ([place]) 12/05/2008    s/p RFCA  . BRADYCARDIA 12/05/2008  . AAA 09/19/2008  . PERIPHERAL VASCULAR DISEASE 09/19/2008  . GERD 09/19/2008  . DIVERTICULOSIS, COLON 09/19/2008  . RENAL INSUFFICIENCY 09/19/2008  . LOW BACK PAIN 09/19/2008  . PSA, INCREASED 09/19/2008  . COLONIC POLYPS, HX OF 09/19/2008    adenomatous polyps 07/2010  . GASTROINTESTINAL HEMORRHAGE, HX OF 04/19/2008  . NEPHROLITHIASIS, HX OF 01/11/2009  . CORONARY ARTERY BYPASS GRAFT, HX OF 12/05/2008    A. LIMA-LAD, VG-RI, VG-OM2, VG-RPD/RPL;  B. 05/2011 - NSTEMI - CATH WITH 4/5 PATENT GRAFTS AND NEW THROMBUS IN DISTAL VG-OM2 - MED RX  . ANXIETY 06/13/2010  . Arthritis   . COPD (chronic obstructive pulmonary disease) ([place])   . Myocardial infarction ([place])   .  Allergy   . Benign esophageal stricture 09/2075    dilated during EGD  . Erectile dysfunction 09/13/2012  . CKD (chronic kidney disease) 09/19/2008    Qualifier: Diagnosis of  By: Jenny Reichmann MD, Hunt Oris    Past Surgical History  Procedure Laterality Date  . Aorto-femoral bypass x5 - stress test neg 9/08    . Coronary artery bypass graft    . S/p afib ablation  2004  . Cardiac catheterization  05/25/2011  . Colon surgery    . Esophagogastroduodenoscopy  04/28/2012    Procedure: ESOPHAGOGASTRODUODENOSCOPY (EGD);  Surgeon: [name], MD;  Location: [facility];  Service: Endoscopy;  Laterality: N/A;  . Enteroscopy N/A 02/13/2014    Procedure: ENTEROSCOPY;  Surgeon: [name], MD;  Location: [place];  Service: Gastroenterology;  Laterality: N/A;  super slim  . Left heart catheterization with coronary/graft angiogram  05/25/2011    Procedure: LEFT HEART CATHETERIZATION WITH [name];  Surgeon: [name], MD;  Location: [facility] CATH LAB;  Service: Cardiovascular;;   Family History  Problem Relation Age of Onset  . Diabetes Father   . Diabetes Sister   . Multiple myeloma Mother   . Cancer Mother   . Other Mother     varicose veins  . Heart disease Paternal Uncle   . Heart disease Maternal Uncle   . Lymphoma Sister     half-sister  Social History  Substance Use Topics  . Smoking status: Former Smoker -- 4.00 packs/day for 31 years    Types: Cigarettes    Quit date: 03/19/1983  . Smokeless tobacco: Never Used  . Alcohol Use: 6.0 oz/week    10 Shots of liquor per week    Review of Systems  Constitutional: Negative for fever.  Respiratory: Negative for cough.   All other systems reviewed and are negative.     Allergies  Bee venom; Promethazine hcl; Doxycycline hyclate; Lipitor; Other; Tetracycline; Crestor; and Tetanus toxoid  Home Medications   Prior to Admission medications   Medication Sig Start Date End Date Taking? Authorizing Provider   albuterol (PROVENTIL HFA;VENTOLIN HFA) 108 (90 BASE) MCG/ACT inhaler Inhale 2 puffs into the lungs every 6 (six) hours as needed for wheezing or shortness of breath.   Yes Historical Provider, MD  allopurinol (ZYLOPRIM) 300 MG tablet take 1 tablet by mouth once daily 05/22/15  Yes Biagio Borg, MD  amiodarone (PACERONE) 200 MG tablet Take 0.5 tablets (100 mg total) by mouth daily. 02/14/15  Yes Evans Lance, MD  aspirin EC 81 MG tablet Take 1 tablet (81 mg total) by mouth daily. Patient taking differently: Take 162 mg by mouth daily.  02/15/14  Yes Samella Parr, NP  b complex vitamins tablet Take 1 tablet by mouth daily.   Yes Historical Provider, MD  calcium elemental as carbonate (TUMS ULTRA 1000) 400 MG tablet Chew 1,000 mg by mouth daily as needed for heartburn.   Yes Historical Provider, MD  Cholecalciferol (VITAMIN D) 2000 UNITS CAPS Take 2,000 Units by mouth daily.    Yes Historical Provider, MD  EPINEPHrine (EPIPEN) 0.3 mg/0.3 mL SOAJ injection Inject 0.3 mLs (0.3 mg total) into the muscle once. 02/01/13  Yes Gay Filler Copland, MD  hydrocortisone cream 0.5 % Apply 1 application topically daily.    Yes Historical Provider, MD  levothyroxine (SYNTHROID, LEVOTHROID) 125 MCG tablet take 1 tablet by mouth once daily 05/22/15  Yes Biagio Borg, MD  MAGNESIUM PO Take 1 tablet by mouth daily.    Yes Historical Provider, MD  Multiple Vitamin (MULTIVITAMIN) tablet Take 1 tablet by mouth at bedtime.    Yes Historical Provider, MD  nitroGLYCERIN (NITROSTAT) 0.4 MG SL tablet Place 1 tablet (0.4 mg total) under the tongue every 5 (five) minutes as needed for chest pain. 06/27/14 03/17/16 Yes Biagio Borg, MD  pantoprazole (PROTONIX) 40 MG tablet take 1 tablet by mouth once daily 05/22/15  Yes Biagio Borg, MD  tiotropium (SPIRIVA HANDIHALER) 18 MCG inhalation capsule PLACE 1 CAPSULE (18 MCG TOTAL) INTO INHALER AND INHALE DAILY. 06/27/14  Yes Biagio Borg, MD  vardenafil (LEVITRA) 20 MG tablet Take 1 tablet (20  mg total) by mouth as needed for erectile dysfunction. 06/27/14  Yes Biagio Borg, MD   BP 139/77 mmHg  Pulse 54  Resp 14  SpO2 96% Physical Exam  Constitutional: He appears well-developed and well-nourished. No distress.  HENT:  Head: Normocephalic and atraumatic.  Right Ear: External ear normal.  Left Ear: External ear normal.  Eyes: Conjunctivae are normal. Right eye exhibits no discharge. Left eye exhibits no discharge. No scleral icterus.  Neck: Neck supple. No tracheal deviation present.  Cardiovascular: Normal rate, regular rhythm and normal heart sounds.   Pulmonary/Chest: Effort normal and breath sounds normal. No stridor. No respiratory distress. He has no rales.  Abdominal: He exhibits no distension. There is no tenderness. There is  no rebound.  Musculoskeletal: He exhibits no edema.  Neurological: He is alert. Cranial nerve deficit: no gross deficits.  Skin: Skin is warm and dry. No rash noted.  Psychiatric: He has a normal mood and affect.  Nursing note and vitals reviewed.   ED Course  Procedures (including critical care time) Labs Review Labs Reviewed  BASIC METABOLIC PANEL - Abnormal; Notable for the following:    Glucose, Bld 153 (*)    BUN 21 (*)    Creatinine, Ser 1.84 (*)    GFR calc non Af Amer 33 (*)    GFR calc Af Amer 39 (*)    All other components within normal limits  CBC WITH DIFFERENTIAL/PLATELET  Randolm Idol, ED  Randolm Idol, ED    Imaging Review Dg Chest 2 View  06/19/2015  CLINICAL DATA:  Chest pain EXAM: CHEST  2 VIEW COMPARISON:  02/12/2015. FINDINGS: The lungs are clear wiithout focal pneumonia, edema, pneumothorax or pleural effusion. Stable linear scarring left lung base. The cardiopericardial silhouette is within normal limits for size. Patient is status post CABG. Superior endplate compression fracture noted at L1. IMPRESSION: No active cardiopulmonary disease. Electronically Signed   By: Misty Stanley M.D.   On: 06/19/2015 12:04    Ct Angio Chest Aorta W/cm &/or Wo/cm  06/19/2015  CLINICAL DATA:  Sharp chest and back pain.  Nausea and weakness. EXAM: CT ANGIOGRAPHY CHEST CTA ABDOMEN AND PELVIS WITHOUT AND WITH CONTRAST TECHNIQUE: Multidetector CT imaging of the chest, abdomen and pelvis was performed using the standard protocol during bolus administration of intravenous contrast. Multiplanar CT image reconstructions and MIPs were obtained to evaluate the vascular anatomy. CONTRAST:  23m OMNIPAQUE IOHEXOL 350 MG/ML SOLN COMPARISON:  Chest radiograph 06/19/2015, CT of the chest 09/22/2010 FINDINGS: CTA CHEST FINDINGS Status post CABG. Right main coronary artery and left circumflex grafts are patent. Calcified atherosclerotic disease of the native coronary arteries noted. Moderate in severity calcified and noncalcified plaque of the aorta and tortuosity is seen, without evidence of dissection. There is no evidence of pulmonary embolus. No axillary lymphadenopathy is seen. The visualized portions of the thyroid gland are unremarkable in appearance. The heart is enlarged with preferential right heart enlargement. No pericardial effusion. Mild left apical subpleural scarring. Lingular linear subpleural thickening likely also corresponds to scarring. There is no evidence of significant focal lung parenchymal consolidation, pleural effusion or pneumothorax. No masses are identified ; no abnormal focal contrast enhancement is seen. No acute osseous abnormalities are seen. CT ABDOMEN and PELVIS FINDINGS The liver, spleen, adrenals, pancreas, kidneys are unremarkable. There is no evidence of bowel obstruction, enteritis, colitis, diverticulitis, nor appendicitis. The appendix is identified and is unremarkable. A moderate amount of fecal retention is appreciated within the colon. There is no evidence of abdominal aortic aneurysm. There is calcified and nonaggressive of plaque along the abdominal aorta and iliac arteries. Evidence of soft plaque  ulceration is seen within the mid right common iliac artery ( image 218/ series 6 and image 80/sequence 10). Mild fusiform dilation of the right common iliac artery measuring 18 mm in maximum diameter is seen at the same level. The celiac, SMA, IMA, portal vein, SMV are opacified. Coarse calcifications are seen at the takeoff of celiac trunk, SMA, bilateral renal arteries, without flow limiting stenosis. There is no evidence of abdominal or pelvic free fluid, loculated fluid collections, masses, nor adenopathy scant mural calcifications identified within the aorta and mesenteric vessels. There is no evidence of abdominal wall nor inguinal hernia.  There is mild colonic diverticulosis without evidence of diverticulitis the appendix is normal. The urinary bladder is distended. Prostate gland appears mildly prominent and abutting the posterior bladder wall. There is no evidence of aggressive appearing osseous lesions. There is mild S shape spine scoliosis. Multilevel osteoarthritic changes are seen, worse at T12-L1, with there is a superior endplate compression deformity with approximately 40% height loss, likely degenerative. L1-L2, L2-L3 also demonstrate advanced osteoarthritic changes. Significant posterior facet arthropathy of the lower lumbosacral spine. Review of the MIP images confirms the above findings. IMPRESSION: No evidence of aortic dissection or pulmonary embolus. Mild cardiomegaly. Post CABG with patent graft to the main right coronary artery and left circumflex artery. Tortuosity and atherosclerotic disease of the aorta with calcified and noncalcified plaque. Bulky calcifications at the takeoff of the major aortic abdominal branches without evidence of flow limiting stenosis. An area of mild fusiform dilation of the mid right iliac artery and focal soft plaque ulceration. No acute abnormalities within the solid abdominal organs. Diffuse colonic diverticulosis without evidence of diverticulitis. Mild  prominence of the prostate gland, impinging on the posterior wall of the urinary bladder. Correlation to PCAs recommended. Multilevel osteoarthritic changes of the spine. Likely degenerative superior endplate L1 compression fracture with approximately 40% height loss. Electronically Signed   By: Fidela Salisbury M.D.   On: 06/19/2015 14:12   Ct Angio Abd/pel W/ And/or W/o  06/19/2015  CLINICAL DATA:  Sharp chest and back pain.  Nausea and weakness. EXAM: CT ANGIOGRAPHY CHEST CTA ABDOMEN AND PELVIS WITHOUT AND WITH CONTRAST TECHNIQUE: Multidetector CT imaging of the chest, abdomen and pelvis was performed using the standard protocol during bolus administration of intravenous contrast. Multiplanar CT image reconstructions and MIPs were obtained to evaluate the vascular anatomy. CONTRAST:  23m OMNIPAQUE IOHEXOL 350 MG/ML SOLN COMPARISON:  Chest radiograph 06/19/2015, CT of the chest 09/22/2010 FINDINGS: CTA CHEST FINDINGS Status post CABG. Right main coronary artery and left circumflex grafts are patent. Calcified atherosclerotic disease of the native coronary arteries noted. Moderate in severity calcified and noncalcified plaque of the aorta and tortuosity is seen, without evidence of dissection. There is no evidence of pulmonary embolus. No axillary lymphadenopathy is seen. The visualized portions of the thyroid gland are unremarkable in appearance. The heart is enlarged with preferential right heart enlargement. No pericardial effusion. Mild left apical subpleural scarring. Lingular linear subpleural thickening likely also corresponds to scarring. There is no evidence of significant focal lung parenchymal consolidation, pleural effusion or pneumothorax. No masses are identified ; no abnormal focal contrast enhancement is seen. No acute osseous abnormalities are seen. CT ABDOMEN and PELVIS FINDINGS The liver, spleen, adrenals, pancreas, kidneys are unremarkable. There is no evidence of bowel obstruction,  enteritis, colitis, diverticulitis, nor appendicitis. The appendix is identified and is unremarkable. A moderate amount of fecal retention is appreciated within the colon. There is no evidence of abdominal aortic aneurysm. There is calcified and nonaggressive of plaque along the abdominal aorta and iliac arteries. Evidence of soft plaque ulceration is seen within the mid right common iliac artery ( image 218/ series 6 and image 80/sequence 10). Mild fusiform dilation of the right common iliac artery measuring 18 mm in maximum diameter is seen at the same level. The celiac, SMA, IMA, portal vein, SMV are opacified. Coarse calcifications are seen at the takeoff of celiac trunk, SMA, bilateral renal arteries, without flow limiting stenosis. There is no evidence of abdominal or pelvic free fluid, loculated fluid collections, masses, nor adenopathy scant mural  calcifications identified within the aorta and mesenteric vessels. There is no evidence of abdominal wall nor inguinal hernia. There is mild colonic diverticulosis without evidence of diverticulitis the appendix is normal. The urinary bladder is distended. Prostate gland appears mildly prominent and abutting the posterior bladder wall. There is no evidence of aggressive appearing osseous lesions. There is mild S shape spine scoliosis. Multilevel osteoarthritic changes are seen, worse at T12-L1, with there is a superior endplate compression deformity with approximately 40% height loss, likely degenerative. L1-L2, L2-L3 also demonstrate advanced osteoarthritic changes. Significant posterior facet arthropathy of the lower lumbosacral spine. Review of the MIP images confirms the above findings. IMPRESSION: No evidence of aortic dissection or pulmonary embolus. Mild cardiomegaly. Post CABG with patent graft to the main right coronary artery and left circumflex artery. Tortuosity and atherosclerotic disease of the aorta with calcified and noncalcified plaque. Bulky  calcifications at the takeoff of the major aortic abdominal branches without evidence of flow limiting stenosis. An area of mild fusiform dilation of the mid right iliac artery and focal soft plaque ulceration. No acute abnormalities within the solid abdominal organs. Diffuse colonic diverticulosis without evidence of diverticulitis. Mild prominence of the prostate gland, impinging on the posterior wall of the urinary bladder. Correlation to PCAs recommended. Multilevel osteoarthritic changes of the spine. Likely degenerative superior endplate L1 compression fracture with approximately 40% height loss. Electronically Signed   By: Fidela Salisbury M.D.   On: 06/19/2015 14:12   I have personally reviewed and evaluated these images and lab results as part of my medical decision-making.   EKG Interpretation   Date/Time:  Wednesday June 19 2015 11:25:01 EST Ventricular Rate:  68 PR Interval:  210 QRS Duration: 146 QT Interval:  438 QTC Calculation: 465 R Axis:   -87 Text Interpretation:  Sinus rhythm with 1st degree A-V block Left axis  deviation Right bundle branch block T wave abnormality, consider lateral  ischemia , increased  from last tracing Abnormal ECG Confirmed by Deijah Spikes   MD-J, Boneta Standre ([id number]) on 06/19/2015 12:23:08 PM     Medications  morphine 4 MG/ML injection 4 mg (not administered)  nitroGLYCERIN 50 mg in dextrose 5 % 250 mL (0.2 mg/mL) infusion (not administered)  morphine 4 MG/ML injection 4 mg (4 mg Intravenous Given 06/19/15 1258)  iohexol (OMNIPAQUE) 350 MG/ML injection 80 mL (80 mLs Intravenous Contrast Given 06/19/15 1324)  1500  Pain better but not resolved.  Will add ntg drip.  Give another dose of morphine  MDM   Final diagnoses:  Chest pain, unspecified chest pain type    Patient presented to the emergency room with complaints of chest and back pain. Pain is primarily in the back and feels similar to prior anginal symptoms. I was concerned about the possibility of an  aortic dissection considering his pain presentation. CT angiogram was performed which does not show any evidence of aneurysm.  Initial cardiac enzymes are normal however the patient feels is similar to his prior angina. He does have known history of coronary artery disease.  I will consult with his cardiologist for possible admission and further evaluation.    Dorie Rank, MD 06/19/15 1456  Dorie Rank, MD 08/12/15 [phone]

## 2015-06-19 NOTE — ED Notes (Signed)
PT used two SL nitro at home at 1055 and 1115 with no relief. PT also took 324 mg of ASA at home.

## 2015-06-19 NOTE — Progress Notes (Signed)
Willette Brace, RN as she had stated his chest pain = 1, will not be pain free. Stated per CN, patient can not transfer to 2W with active chest pain, even if it is only a 1. Stated she'd ask patient if he is now pain free. I reminded her to be sure MD aware reports pain = 1.

## 2015-06-19 NOTE — ED Notes (Signed)
MD at bedside. 

## 2015-06-19 NOTE — H&P (Addendum)
Omar Benson is an 81 y.o. male.    Primary Cardiologist:Dr. Lovena Le  PCP: [name], MD  Chief Complaint: presented to ER with chest pain similar to MI pain- new RBBB on EKG.   HPI: 81 year old male with hx CAD with CABG in 2004.  Last cath 2013 with patent LIMA to LAD, VG to intermediate, VG to PDA and PLA, subtotally occluded heavily thrombotic graft to smaller OM2, native second ramus with 50% narrowing.    Last echo 2013 with MI EF 60% no wall motion abnormality.   Other hx with a flutter s/p ablation and hx of a fib in the interim but maintaining SR.  Hx GI AVM. Small distal AAA.  Only PVD is iliac stenosis of 50%.  + COPD was followed by Dr. [name].  Hx back pain.    Today presented to ER after developing upper back pain and then pain under tongue and into jaw no associated symptoms of SOB or nausea but + weakness.  No irregular HR though he has skipped beats at times.  He took a total of 2 sl NTG at home without much help and 4 baby ASA.    EKG with new RBBB with lat ST depression possible from the bundle.  His last meal was around 10 am with pecan roll.  Now on IV NTG jaw pain present but better and he just had IV morphine.  CT of chest without dissection.  Denies any recent GI bleeding and hgb stable.  Past Medical History  Diagnosis Date  . Hypothyroidism 12/05/2008  . HYPERLIPIDEMIA 09/19/2008  . GOUT 09/19/2008  . ANEMIA-IRON DEFICIENCY 09/19/2008  . HYPERTENSION 09/19/2008  . CAD 12/05/2008    s/p CABG; NSTEMI 05/2011 - LHC 05/25/11: LAD occluded, LIMA-LAD patent, ostial circumflex 50%, AV circumflex 70%, SVG-OM2 with extensive disease with thrombus (culprit vessel), RCA 80% and occluded, SVG-intermediate patent, SVG-PDA/PL A patent.  Given that the culprit vessel was a subtotally occluded heavily thrombotic graft to a smaller OM2, medical therapy was recommended.;   . Atrial fibrillation ([place]) 04/19/2008    amiodarone rx;  Echocardiogram 05/26/11: No wall motion abnormalities,  mild LVH, EF 60%.  . Atrial flutter ([name]) 12/05/2008    s/p RFCA  . BRADYCARDIA 12/05/2008  . AAA 09/19/2008  . PERIPHERAL VASCULAR DISEASE 09/19/2008  . GERD 09/19/2008  . DIVERTICULOSIS, COLON 09/19/2008  . RENAL INSUFFICIENCY 09/19/2008  . LOW BACK PAIN 09/19/2008  . PSA, INCREASED 09/19/2008  . COLONIC POLYPS, HX OF 09/19/2008    adenomatous polyps 07/2010  . GASTROINTESTINAL HEMORRHAGE, HX OF 04/19/2008  . NEPHROLITHIASIS, HX OF 01/11/2009  . CORONARY ARTERY BYPASS GRAFT, HX OF 12/05/2008    A. LIMA-LAD, VG-RI, VG-OM2, VG-RPD/RPL;  B. 05/2011 - NSTEMI - CATH WITH 4/5 PATENT GRAFTS AND NEW THROMBUS IN DISTAL VG-OM2 - MED RX  . ANXIETY 06/13/2010  . Arthritis   . COPD (chronic obstructive pulmonary disease) ([place])   . Myocardial infarction ([place])   . Allergy   . Benign esophageal stricture 09/2075    dilated during EGD  . Erectile dysfunction 09/13/2012  . CKD (chronic kidney disease) 09/19/2008    Qualifier: Diagnosis of  By: Jenny Reichmann MD, Hunt Oris   . Unstable angina [facility]) 06/19/2015    Past Surgical History  Procedure Laterality Date  . Aorto-femoral bypass x5 - stress test neg 9/08    . Coronary artery bypass graft    . S/p afib ablation  2004  .  Cardiac catheterization  05/25/2011  . Colon surgery    . Esophagogastroduodenoscopy  04/28/2012    Procedure: ESOPHAGOGASTRODUODENOSCOPY (EGD);  Surgeon: [name], MD;  Location: [place];  Service: Endoscopy;  Laterality: N/A;  . Enteroscopy N/A 02/13/2014    Procedure: ENTEROSCOPY;  Surgeon: [name], MD;  Location: [place];  Service: Gastroenterology;  Laterality: N/A;  super slim  . Left heart catheterization with coronary/graft angiogram  05/25/2011    Procedure: LEFT HEART CATHETERIZATION WITH [name];  Surgeon: [name], MD;  Location: [facility] CATH LAB;  Service: Cardiovascular;;    Family History  Problem Relation Age of Onset  . Diabetes Father   . Diabetes Sister   . Multiple myeloma Mother   . Cancer Mother     . Other Mother     varicose veins  . Heart disease Paternal Uncle   . Heart disease Maternal Uncle   . Lymphoma Sister     half-sister   Social History:  reports that he quit smoking about 32 years ago. His smoking use included Cigarettes. He has a 124 pack-year smoking history. He has never used smokeless tobacco. He reports that he drinks about 6.0 oz of alcohol per week. He reports that he does not use illicit drugs.  Allergies:  Allergies  Allergen Reactions  . Bee Venom Anaphylaxis    Only yellow jackets   . Promethazine Hcl Other (See Comments)    syncope  . Doxycycline Hyclate Other (See Comments)    esophagitis  . Lipitor [Atorvastatin Calcium] Other (See Comments)    Myalgia/myopathy  . Other Diarrhea and Nausea And Vomiting    Seafood  . Tetracycline Other (See Comments)    Esophagitis  . Crestor [Rosuvastatin] Other (See Comments)    Myalgia/myopathy  . Tetanus Toxoid Rash   OUTPATIENT MEDICATIONS: No current facility-administered medications on file prior to encounter.   Current Outpatient Prescriptions on File Prior to Encounter  Medication Sig Dispense Refill  . albuterol (PROVENTIL HFA;VENTOLIN HFA) 108 (90 BASE) MCG/ACT inhaler Inhale 2 puffs into the lungs every 6 (six) hours as needed for wheezing or shortness of breath.    . allopurinol (ZYLOPRIM) 300 MG tablet take 1 tablet by mouth once daily 90 tablet 0  . amiodarone (PACERONE) 200 MG tablet Take 0.5 tablets (100 mg total) by mouth daily. 45 tablet 3  . aspirin EC 81 MG tablet Take 1 tablet (81 mg total) by mouth daily. (Patient taking differently: Take 162 mg by mouth daily. )    . b complex vitamins tablet Take 1 tablet by mouth daily.    . calcium elemental as carbonate (TUMS ULTRA 1000) 400 MG tablet Chew 1,000 mg by mouth daily as needed for heartburn.    . Cholecalciferol (VITAMIN D) 2000 UNITS CAPS Take 2,000 Units by mouth daily.     Marland Kitchen EPINEPHrine (EPIPEN) 0.3 mg/0.3 mL SOAJ injection Inject 0.3  mLs (0.3 mg total) into the muscle once. 2 Device 2  . hydrocortisone cream 0.5 % Apply 1 application topically daily.     Marland Kitchen levothyroxine (SYNTHROID, LEVOTHROID) 125 MCG tablet take 1 tablet by mouth once daily 90 tablet 0  . MAGNESIUM PO Take 1 tablet by mouth daily.     . Multiple Vitamin (MULTIVITAMIN) tablet Take 1 tablet by mouth at bedtime.     . nitroGLYCERIN (NITROSTAT) 0.4 MG SL tablet Place 1 tablet (0.4 mg total) under the tongue every 5 (five) minutes as needed for chest pain. 25  tablet 5  . pantoprazole (PROTONIX) 40 MG tablet take 1 tablet by mouth once daily 90 tablet 0  . tiotropium (SPIRIVA HANDIHALER) 18 MCG inhalation capsule PLACE 1 CAPSULE (18 MCG TOTAL) INTO INHALER AND INHALE DAILY. 90 capsule 3  . vardenafil (LEVITRA) 20 MG tablet Take 1 tablet (20 mg total) by mouth as needed for erectile dysfunction. 10 tablet 11     Results for orders placed or performed during the hospital encounter of 06/19/15 (from the past 48 hour(s))  Basic metabolic panel     Status: Abnormal   Collection Time: 06/19/15 11:24 AM  Result Value Ref Range   Sodium 140 135 - 145 mmol/L   Potassium 4.6 3.5 - 5.1 mmol/L   Chloride 107 101 - 111 mmol/L   CO2 22 22 - 32 mmol/L   Glucose, Bld 153 (H) 65 - 99 mg/dL   BUN 21 (H) 6 - 20 mg/dL   Creatinine, Ser 1.84 (H) 0.61 - 1.24 mg/dL   Calcium 9.7 8.9 - 10.3 mg/dL   GFR calc non Af Amer 33 (L) >60 mL/min   GFR calc Af Amer 39 (L) >60 mL/min    Comment: (NOTE) The eGFR has been calculated using the CKD EPI equation. This calculation has not been validated in all clinical situations. eGFR's persistently <60 mL/min signify possible Chronic Kidney Disease.    Anion gap 11 5 - 15  CBC with Differential     Status: None   Collection Time: 06/19/15 11:24 AM  Result Value Ref Range   WBC 4.6 4.0 - 10.5 K/uL   RBC 4.55 4.22 - 5.81 MIL/uL   Hemoglobin 14.7 13.0 - 17.0 g/dL   HCT 43.5 39.0 - 52.0 %   MCV 95.6 78.0 - 100.0 fL   MCH 32.3 26.0 -  34.0 pg   MCHC 33.8 30.0 - 36.0 g/dL   RDW 13.9 11.5 - 15.5 %   Platelets 182 150 - 400 K/uL   Neutrophils Relative % 51 %   Neutro Abs 2.4 1.7 - 7.7 K/uL   Lymphocytes Relative 29 %   Lymphs Abs 1.3 0.7 - 4.0 K/uL   Monocytes Relative 11 %   Monocytes Absolute 0.5 0.1 - 1.0 K/uL   Eosinophils Relative 8 %   Eosinophils Absolute 0.4 0.0 - 0.7 K/uL   Basophils Relative 1 %   Basophils Absolute 0.0 0.0 - 0.1 K/uL  I-stat troponin, ED (not at [facility], [facility])     Status: None   Collection Time: 06/19/15 11:36 AM  Result Value Ref Range   Troponin i, poc 0.00 0.00 - 0.08 ng/mL   Comment 3            Comment: Due to the release kinetics of cTnI, a negative result within the first hours of the onset of symptoms does not rule out myocardial infarction with certainty. If myocardial infarction is still suspected, repeat the test at appropriate intervals.   I-stat troponin, ED     Status: None   Collection Time: 06/19/15  3:39 PM  Result Value Ref Range   Troponin i, poc 0.00 0.00 - 0.08 ng/mL   Comment 3            Comment: Due to the release kinetics of cTnI, a negative result within the first hours of the onset of symptoms does not rule out myocardial infarction with certainty. If myocardial infarction is still suspected, repeat the test at appropriate intervals.    Dg Chest 2 View  06/19/2015  CLINICAL DATA:  Chest pain EXAM: CHEST  2 VIEW COMPARISON:  02/12/2015. FINDINGS: The lungs are clear wiithout focal pneumonia, edema, pneumothorax or pleural effusion. Stable linear scarring left lung base. The cardiopericardial silhouette is within normal limits for size. Patient is status post CABG. Superior endplate compression fracture noted at L1. IMPRESSION: No active cardiopulmonary disease. Electronically Signed   By: Misty Stanley M.D.   On: 06/19/2015 12:04   Ct Angio Chest Aorta W/cm &/or Wo/cm  06/19/2015  CLINICAL DATA:  Sharp chest and back pain.  Nausea and weakness. EXAM: CT  ANGIOGRAPHY CHEST CTA ABDOMEN AND PELVIS WITHOUT AND WITH CONTRAST TECHNIQUE: Multidetector CT imaging of the chest, abdomen and pelvis was performed using the standard protocol during bolus administration of intravenous contrast. Multiplanar CT image reconstructions and MIPs were obtained to evaluate the vascular anatomy. CONTRAST:  5m OMNIPAQUE IOHEXOL 350 MG/ML SOLN COMPARISON:  Chest radiograph 06/19/2015, CT of the chest 09/22/2010 FINDINGS: CTA CHEST FINDINGS Status post CABG. Right main coronary artery and left circumflex grafts are patent. Calcified atherosclerotic disease of the native coronary arteries noted. Moderate in severity calcified and noncalcified plaque of the aorta and tortuosity is seen, without evidence of dissection. There is no evidence of pulmonary embolus. No axillary lymphadenopathy is seen. The visualized portions of the thyroid gland are unremarkable in appearance. The heart is enlarged with preferential right heart enlargement. No pericardial effusion. Mild left apical subpleural scarring. Lingular linear subpleural thickening likely also corresponds to scarring. There is no evidence of significant focal lung parenchymal consolidation, pleural effusion or pneumothorax. No masses are identified ; no abnormal focal contrast enhancement is seen. No acute osseous abnormalities are seen. CT ABDOMEN and PELVIS FINDINGS The liver, spleen, adrenals, pancreas, kidneys are unremarkable. There is no evidence of bowel obstruction, enteritis, colitis, diverticulitis, nor appendicitis. The appendix is identified and is unremarkable. A moderate amount of fecal retention is appreciated within the colon. There is no evidence of abdominal aortic aneurysm. There is calcified and nonaggressive of plaque along the abdominal aorta and iliac arteries. Evidence of soft plaque ulceration is seen within the mid right common iliac artery ( image 218/ series 6 and image 80/sequence 10). Mild fusiform dilation  of the right common iliac artery measuring 18 mm in maximum diameter is seen at the same level. The celiac, SMA, IMA, portal vein, SMV are opacified. Coarse calcifications are seen at the takeoff of celiac trunk, SMA, bilateral renal arteries, without flow limiting stenosis. There is no evidence of abdominal or pelvic free fluid, loculated fluid collections, masses, nor adenopathy scant mural calcifications identified within the aorta and mesenteric vessels. There is no evidence of abdominal wall nor inguinal hernia. There is mild colonic diverticulosis without evidence of diverticulitis the appendix is normal. The urinary bladder is distended. Prostate gland appears mildly prominent and abutting the posterior bladder wall. There is no evidence of aggressive appearing osseous lesions. There is mild S shape spine scoliosis. Multilevel osteoarthritic changes are seen, worse at T12-L1, with there is a superior endplate compression deformity with approximately 40% height loss, likely degenerative. L1-L2, L2-L3 also demonstrate advanced osteoarthritic changes. Significant posterior facet arthropathy of the lower lumbosacral spine. Review of the MIP images confirms the above findings. IMPRESSION: No evidence of aortic dissection or pulmonary embolus. Mild cardiomegaly. Post CABG with patent graft to the main right coronary artery and left circumflex artery. Tortuosity and atherosclerotic disease of the aorta with calcified and noncalcified plaque. Bulky calcifications at the takeoff of the  major aortic abdominal branches without evidence of flow limiting stenosis. An area of mild fusiform dilation of the mid right iliac artery and focal soft plaque ulceration. No acute abnormalities within the solid abdominal organs. Diffuse colonic diverticulosis without evidence of diverticulitis. Mild prominence of the prostate gland, impinging on the posterior wall of the urinary bladder. Correlation to PCAs recommended. Multilevel  osteoarthritic changes of the spine. Likely degenerative superior endplate L1 compression fracture with approximately 40% height loss. Electronically Signed   By: Fidela Salisbury M.D.   On: 06/19/2015 14:12   Ct Angio Abd/pel W/ And/or W/o  06/19/2015  CLINICAL DATA:  Sharp chest and back pain.  Nausea and weakness. EXAM: CT ANGIOGRAPHY CHEST CTA ABDOMEN AND PELVIS WITHOUT AND WITH CONTRAST TECHNIQUE: Multidetector CT imaging of the chest, abdomen and pelvis was performed using the standard protocol during bolus administration of intravenous contrast. Multiplanar CT image reconstructions and MIPs were obtained to evaluate the vascular anatomy. CONTRAST:  34m OMNIPAQUE IOHEXOL 350 MG/ML SOLN COMPARISON:  Chest radiograph 06/19/2015, CT of the chest 09/22/2010 FINDINGS: CTA CHEST FINDINGS Status post CABG. Right main coronary artery and left circumflex grafts are patent. Calcified atherosclerotic disease of the native coronary arteries noted. Moderate in severity calcified and noncalcified plaque of the aorta and tortuosity is seen, without evidence of dissection. There is no evidence of pulmonary embolus. No axillary lymphadenopathy is seen. The visualized portions of the thyroid gland are unremarkable in appearance. The heart is enlarged with preferential right heart enlargement. No pericardial effusion. Mild left apical subpleural scarring. Lingular linear subpleural thickening likely also corresponds to scarring. There is no evidence of significant focal lung parenchymal consolidation, pleural effusion or pneumothorax. No masses are identified ; no abnormal focal contrast enhancement is seen. No acute osseous abnormalities are seen. CT ABDOMEN and PELVIS FINDINGS The liver, spleen, adrenals, pancreas, kidneys are unremarkable. There is no evidence of bowel obstruction, enteritis, colitis, diverticulitis, nor appendicitis. The appendix is identified and is unremarkable. A moderate amount of fecal retention  is appreciated within the colon. There is no evidence of abdominal aortic aneurysm. There is calcified and nonaggressive of plaque along the abdominal aorta and iliac arteries. Evidence of soft plaque ulceration is seen within the mid right common iliac artery ( image 218/ series 6 and image 80/sequence 10). Mild fusiform dilation of the right common iliac artery measuring 18 mm in maximum diameter is seen at the same level. The celiac, SMA, IMA, portal vein, SMV are opacified. Coarse calcifications are seen at the takeoff of celiac trunk, SMA, bilateral renal arteries, without flow limiting stenosis. There is no evidence of abdominal or pelvic free fluid, loculated fluid collections, masses, nor adenopathy scant mural calcifications identified within the aorta and mesenteric vessels. There is no evidence of abdominal wall nor inguinal hernia. There is mild colonic diverticulosis without evidence of diverticulitis the appendix is normal. The urinary bladder is distended. Prostate gland appears mildly prominent and abutting the posterior bladder wall. There is no evidence of aggressive appearing osseous lesions. There is mild S shape spine scoliosis. Multilevel osteoarthritic changes are seen, worse at T12-L1, with there is a superior endplate compression deformity with approximately 40% height loss, likely degenerative. L1-L2, L2-L3 also demonstrate advanced osteoarthritic changes. Significant posterior facet arthropathy of the lower lumbosacral spine. Review of the MIP images confirms the above findings. IMPRESSION: No evidence of aortic dissection or pulmonary embolus. Mild cardiomegaly. Post CABG with patent graft to the main right coronary artery and left circumflex artery. Tortuosity  and atherosclerotic disease of the aorta with calcified and noncalcified plaque. Bulky calcifications at the takeoff of the major aortic abdominal branches without evidence of flow limiting stenosis. An area of mild fusiform  dilation of the mid right iliac artery and focal soft plaque ulceration. No acute abnormalities within the solid abdominal organs. Diffuse colonic diverticulosis without evidence of diverticulitis. Mild prominence of the prostate gland, impinging on the posterior wall of the urinary bladder. Correlation to PCAs recommended. Multilevel osteoarthritic changes of the spine. Likely degenerative superior endplate L1 compression fracture with approximately 40% height loss. Electronically Signed   By: Fidela Salisbury M.D.   On: 06/19/2015 14:12    ROS: General:no colds or fevers, no weight changes Skin:no rashes or ulcers HEENT:no blurred vision, no congestion CV:see HPI PUL:see HPI GI:no diarrhea constipation or melena, no indigestion GU:no hematuria, no dysuria MS:no joint pain, no claudication Neuro:no syncope, no lightheadedness- recently with vertigo Endo:no diabetes, + thyroid disease   Blood pressure 165/79, pulse 55, resp. rate 16, SpO2 98 %. PE: General:Pleasant affect, NAD Skin:Warm and dry, brisk capillary refill HEENT:normocephalic, sclera clear, mucus membranes moist Neck:supple, no JVD, no bruits  Heart:S1S2 RRR without murmur, gallup, rub or click Lungs:clear without rales, rhonchi, or wheezes KNL:ZJQB, non tender, + BS, do not palpate liver spleen or masses Ext:no lower ext edema, 2+ pedal pulses, 2+ radial pulses Neuro:alert and oriented X 3, MAE, follows commands, + facial symmetry    Assessment/Plan Principal Problem:   Unstable angina (HCC)- began this am with upper back pain and jaw pain, now with IV NTG some improvement along with morphine.  EKG with new RBBB, troponin is neg.  Will continue IV NTG.  No BB due to brady and new RBBB.  Hold arb due to elevated Cr.  Add amlodipine.     Active Problems:   Hyperlipidemia LDL goal <70- has been intolerant to statins. Will check lipids add low dose zocor for now    Essential hypertension- elevated today on IV NTG will add  amlodipine    Coronary atherosclerosis, CABG 2004- last cath in 2013   Aneurysm of abdominal vessel ([place])- recently checked 2016    CKD (chronic kidney disease)- stage 3    Hx PAF maintaining SR on no anticoagulation - history of AVMs on eliquis.    [place] Nurse Practitioner Certified [place] Pager [phone] or after 5pm or weekends call [phone] 06/19/2015, 4:09 PM   I have examined the patient and reviewed assessment and plan and discussed with patient.  Agree with above as stated.  Troponin negative.  BP high.  He is resistant to starting a BP med but this may help reduce strain on the heart.  Rule out for MI.  If he rules out. Consider medical therapy vs. Stress testing.  Would like to avoid cath given his renal insufficiency.  Patent graft to RCA noted on CT.  Unlikely that LIMA to LAD would have problem.  New RBBB on ECG may be age related.  If troponin turns positive, would start IV heparin.  I think he does have HTN, although he does not want to take long term meds.   PAF- prior GI bleeding.   AAA, needs to be followed.  Omar Rout S.

## 2015-06-19 NOTE — ED Notes (Signed)
PT reports he has no pain at this time, he feels as though his "ears are full."

## 2015-06-19 NOTE — Progress Notes (Signed)
CRITICAL VALUE ALERT  Critical value received:  Troponin 1.74  Date of notification:  05/20/15  Time of notification:  2308  Critical value read back:Yes.    Nurse who received alert:  Jacquelynn Cree  MD notified (1st page):Dr.  Aundra Dubin  Time of first page:  2326  MD notified (2nd page):  Time of second page:  Responding MD:  Dr. Aundra Dubin  Time MD responded:  2326

## 2015-06-19 NOTE — ED Notes (Signed)
Latanya Presser, RN accepts report at this time

## 2015-06-19 NOTE — Progress Notes (Signed)
ANTICOAGULATION CONSULT NOTE - Initial Consult  Pharmacy Consult for Heparin  Indication: chest pain/ACS, elevated troponin   Allergies  Allergen Reactions  . Bee Venom Anaphylaxis    Only yellow jackets   . Promethazine Hcl Other (See Comments)    syncope  . Doxycycline Hyclate Other (See Comments)    esophagitis  . Lipitor [Atorvastatin Calcium] Other (See Comments)    Myalgia/myopathy  . Other Diarrhea and Nausea And Vomiting    Seafood  . Tetracycline Other (See Comments)    Esophagitis  . Crestor [Rosuvastatin] Other (See Comments)    Myalgia/myopathy  . Tetanus Toxoid Rash    Patient Measurements: Height: 5\' 9"  (175.3 cm) Weight: 182 lb 15.7 oz (83 kg) IBW/kg (Calculated) : 70.7  Vital Signs: Temp: 97.8 F (36.6 C) (02/01 1941) Temp Source: Oral (02/01 1941) BP: 123/78 mmHg (02/01 1941) Pulse Rate: 58 (02/01 1941)  Labs:  Recent Labs  06/19/15 1124 06/19/15 1915 06/19/15 2220  HGB 14.7 14.9  --   HCT 43.5 43.3  --   PLT 182 196  --   CREATININE 1.84* 1.80*  --   TROPONINI  --  0.38* 1.74*    Estimated Creatinine Clearance: 33.8 mL/min (by C-G formula based on Cr of 1.8).   Medical History: Past Medical History  Diagnosis Date  . Hypothyroidism 12/05/2008  . HYPERLIPIDEMIA 09/19/2008  . GOUT 09/19/2008  . ANEMIA-IRON DEFICIENCY 09/19/2008  . HYPERTENSION 09/19/2008  . CAD 12/05/2008    s/p CABG; NSTEMI 05/2011 - LHC 05/25/11: LAD occluded, LIMA-LAD patent, ostial circumflex 50%, AV circumflex 70%, SVG-OM2 with extensive disease with thrombus (culprit vessel), RCA 80% and occluded, SVG-intermediate patent, SVG-PDA/PL A patent.  Given that the culprit vessel was a subtotally occluded heavily thrombotic graft to a smaller OM2, medical therapy was recommended.;   . Atrial fibrillation ([place]) 04/19/2008    amiodarone rx;  Echocardiogram 05/26/11: No wall motion abnormalities, mild LVH, EF 60%.  . Atrial flutter ([place]) 12/05/2008    s/p RFCA  . BRADYCARDIA 12/05/2008  .  AAA 09/19/2008  . PERIPHERAL VASCULAR DISEASE 09/19/2008  . GERD 09/19/2008  . DIVERTICULOSIS, COLON 09/19/2008  . RENAL INSUFFICIENCY 09/19/2008  . LOW BACK PAIN 09/19/2008  . PSA, INCREASED 09/19/2008  . COLONIC POLYPS, HX OF 09/19/2008    adenomatous polyps 07/2010  . GASTROINTESTINAL HEMORRHAGE, HX OF 04/19/2008  . NEPHROLITHIASIS, HX OF 01/11/2009  . CORONARY ARTERY BYPASS GRAFT, HX OF 12/05/2008    A. LIMA-LAD, VG-RI, VG-OM2, VG-RPD/RPL;  B. 05/2011 - NSTEMI - CATH WITH 4/5 PATENT GRAFTS AND NEW THROMBUS IN DISTAL VG-OM2 - MED RX  . ANXIETY 06/13/2010  . Arthritis   . COPD (chronic obstructive pulmonary disease) ([place])   . Myocardial infarction ([place])   . Allergy   . Benign esophageal stricture 09/2075    dilated during EGD  . Erectile dysfunction 09/13/2012  . CKD (chronic kidney disease) 09/19/2008    Qualifier: Diagnosis of  By: Jenny Reichmann MD, Hunt Oris     Assessment: Troponin is trending up, starting heparin per pharmacy, CBC good, noted renal dysfunction, other labs/meds reviewed.   Goal of Therapy:  Heparin level 0.3-0.7 units/ml Monitor platelets by anticoagulation protocol: Yes   Plan:  -Heparin 2000 units BOLUS (reduced bolus with recent subcutaneous heparin injection) -Start heparin drip at 1000 units/hr -0800 HL -Daily CBC/HL -Monitor for bleeding  Narda Bonds 06/19/2015,11:54 PM

## 2015-06-20 ENCOUNTER — Encounter (HOSPITAL_COMMUNITY)
Admission: EM | Disposition: A | Discharge status: Home / Self Care | Payer: Self-pay | Source: Home / Self Care | Attending: Interventional Cardiology

## 2015-06-20 ENCOUNTER — Encounter (HOSPITAL_COMMUNITY): Payer: Self-pay | Admitting: Cardiology

## 2015-06-20 DIAGNOSIS — I214 Non-ST elevation (NSTEMI) myocardial infarction: Principal | ICD-10-CM

## 2015-06-20 HISTORY — PX: CARDIAC CATHETERIZATION: SHX172

## 2015-06-20 LAB — TROPONIN I
TROPONIN I: 2.59 ng/mL — AB (ref <0.031)
Troponin I: 6.2 ng/mL (ref <0.031)

## 2015-06-20 LAB — BASIC METABOLIC PANEL
ANION GAP: 8 (ref 5–15)
BUN: 19 mg/dL (ref 6–20)
CALCIUM: 9.5 mg/dL (ref 8.9–10.3)
CO2: 28 mmol/L (ref 22–32)
Chloride: 107 mmol/L (ref 101–111)
Creatinine, Ser: 1.81 mg/dL — ABNORMAL HIGH (ref 0.61–1.24)
GFR, EST AFRICAN AMERICAN: 40 mL/min — AB (ref >60)
GFR, EST NON AFRICAN AMERICAN: 34 mL/min — AB (ref >60)
Glucose, Bld: 109 mg/dL — ABNORMAL HIGH (ref 65–99)
Potassium: 5 mmol/L (ref 3.5–5.1)
SODIUM: 143 mmol/L (ref 135–145)

## 2015-06-20 LAB — CBC
HCT: 40.6 % (ref 39.0–52.0)
HEMOGLOBIN: 13.6 g/dL (ref 13.0–17.0)
MCH: 32 pg (ref 26.0–34.0)
MCHC: 33.5 g/dL (ref 30.0–36.0)
MCV: 95.5 fL (ref 78.0–100.0)
Platelets: 190 10*3/uL (ref 150–400)
RBC: 4.25 MIL/uL (ref 4.22–5.81)
RDW: 14 % (ref 11.5–15.5)
WBC: 5.4 10*3/uL (ref 4.0–10.5)

## 2015-06-20 LAB — HEPARIN LEVEL (UNFRACTIONATED): Heparin Unfractionated: 0.52 IU/mL (ref 0.30–0.70)

## 2015-06-20 LAB — LIPID PANEL
CHOL/HDL RATIO: 3.6 ratio
CHOLESTEROL: 159 mg/dL (ref 0–200)
HDL: 44 mg/dL (ref >40)
LDL CALC: 85 mg/dL (ref 0–99)
TRIGLYCERIDES: 151 mg/dL — AB (ref <150)
VLDL: 30 mg/dL (ref 0–40)

## 2015-06-20 LAB — PROTIME-INR
INR: 1.14 (ref 0.00–1.49)
PROTHROMBIN TIME: 14.8 s (ref 11.6–15.2)

## 2015-06-20 LAB — HEMOGLOBIN A1C
HEMOGLOBIN A1C: 5.7 % — AB (ref 4.8–5.6)
MEAN PLASMA GLUCOSE: 117 mg/dL

## 2015-06-20 SURGERY — CORONARY/GRAFT ANGIOGRAPHY

## 2015-06-20 MED ORDER — HEPARIN SODIUM (PORCINE) 1000 UNIT/ML IJ SOLN
INTRAMUSCULAR | Status: AC
  Filled 2015-06-20: qty 1

## 2015-06-20 MED ORDER — HEPARIN (PORCINE) IN NACL 2-0.9 UNIT/ML-% IJ SOLN
INTRAMUSCULAR | Status: AC
  Filled 2015-06-20: qty 1000

## 2015-06-20 MED ORDER — VERAPAMIL HCL 2.5 MG/ML IV SOLN
INTRA_ARTERIAL | Status: DC | PRN
  Administered 2015-06-20: 10 mL via INTRA_ARTERIAL

## 2015-06-20 MED ORDER — VERAPAMIL HCL 2.5 MG/ML IV SOLN
INTRAVENOUS | Status: AC
  Filled 2015-06-20: qty 2

## 2015-06-20 MED ORDER — HEPARIN (PORCINE) IN NACL 100-0.45 UNIT/ML-% IJ SOLN
1150.0000 [IU]/h | INTRAMUSCULAR | Status: AC
  Administered 2015-06-20: 1000 [IU]/h via INTRAVENOUS
  Filled 2015-06-20: qty 250

## 2015-06-20 MED ORDER — SODIUM CHLORIDE 0.9% FLUSH: 3.0000 mL | INTRAVENOUS | Status: DC | PRN

## 2015-06-20 MED ORDER — IOHEXOL 350 MG/ML SOLN
INTRAVENOUS | Status: DC | PRN
  Administered 2015-06-20: 80 mL via INTRA_ARTERIAL

## 2015-06-20 MED ORDER — SODIUM CHLORIDE 0.9 % WEIGHT BASED INFUSION: 3.0000 mL/kg/h | INTRAVENOUS | Status: DC

## 2015-06-20 MED ORDER — HEPARIN SODIUM (PORCINE) 5000 UNIT/ML IJ SOLN: 5000.0000 [IU] | Freq: Three times a day (TID) | INTRAMUSCULAR | Status: DC

## 2015-06-20 MED ORDER — MIDAZOLAM HCL 2 MG/2ML IJ SOLN
INTRAMUSCULAR | Status: DC | PRN
  Administered 2015-06-20 (×2): 1 mg via INTRAVENOUS

## 2015-06-20 MED ORDER — MIDAZOLAM HCL 2 MG/2ML IJ SOLN
INTRAMUSCULAR | Status: AC
  Filled 2015-06-20: qty 2

## 2015-06-20 MED ORDER — LIDOCAINE HCL (PF) 1 % IJ SOLN
INTRAMUSCULAR | Status: AC
  Filled 2015-06-20: qty 30

## 2015-06-20 MED ORDER — LIDOCAINE HCL (PF) 1 % IJ SOLN
INTRAMUSCULAR | Status: DC | PRN
  Administered 2015-06-20: 2 mL via INTRADERMAL

## 2015-06-20 MED ORDER — FENTANYL CITRATE (PF) 100 MCG/2ML IJ SOLN
INTRAMUSCULAR | Status: AC
  Filled 2015-06-20: qty 2

## 2015-06-20 MED ORDER — SODIUM CHLORIDE 0.9 % WEIGHT BASED INFUSION: 1.0000 mL/kg/h | INTRAVENOUS | Status: AC

## 2015-06-20 MED ORDER — SODIUM CHLORIDE 0.9 % WEIGHT BASED INFUSION
1.0000 mL/kg/h | INTRAVENOUS | Status: DC
  Administered 2015-06-20: 1 mL/kg/h via INTRAVENOUS

## 2015-06-20 MED ORDER — SODIUM CHLORIDE 0.9 % IV SOLN: 250.0000 mL | INTRAVENOUS | Status: DC | PRN

## 2015-06-20 MED ORDER — HEPARIN SODIUM (PORCINE) 1000 UNIT/ML IJ SOLN
INTRAMUSCULAR | Status: DC | PRN
  Administered 2015-06-20: 4000 [IU] via INTRAVENOUS

## 2015-06-20 MED ORDER — HEPARIN (PORCINE) IN NACL 2-0.9 UNIT/ML-% IJ SOLN
INTRAMUSCULAR | Status: DC | PRN
  Administered 2015-06-20: 1000 mL

## 2015-06-20 MED ORDER — ISOSORBIDE MONONITRATE ER 60 MG PO TB24
60.0000 mg | ORAL_TABLET | Freq: Every day | ORAL | Status: DC
  Administered 2015-06-20 – 2015-06-21 (×2): 60 mg via ORAL
  Filled 2015-06-20 (×2): qty 1

## 2015-06-20 MED ORDER — SODIUM CHLORIDE 0.9% FLUSH: 3.0000 mL | Freq: Two times a day (BID) | INTRAVENOUS | Status: DC

## 2015-06-20 MED ORDER — ASPIRIN 81 MG PO CHEW: 81.0000 mg | CHEWABLE_TABLET | ORAL | Status: AC

## 2015-06-20 MED ORDER — FENTANYL CITRATE (PF) 100 MCG/2ML IJ SOLN
INTRAMUSCULAR | Status: DC | PRN
  Administered 2015-06-20 (×2): 25 ug via INTRAVENOUS

## 2015-06-20 MED ORDER — ASPIRIN 81 MG PO CHEW: 81.0000 mg | CHEWABLE_TABLET | ORAL | Status: DC

## 2015-06-20 MED ORDER — SODIUM CHLORIDE 0.9% FLUSH
3.0000 mL | Freq: Two times a day (BID) | INTRAVENOUS | Status: DC
  Administered 2015-06-21: 3 mL via INTRAVENOUS

## 2015-06-20 SURGICAL SUPPLY — 13 items
CATH INFINITI 5 FR IM (CATHETERS) ×3 IMPLANT
CATH INFINITI 5 FR LCB (CATHETERS) ×3 IMPLANT
CATH INFINITI 5 FR RCB (CATHETERS) ×3 IMPLANT
CATH INFINITI 5FR MULTPACK ANG (CATHETERS) ×3 IMPLANT
DEVICE RAD COMP TR BAND LRG (VASCULAR PRODUCTS) ×3 IMPLANT
GLIDESHEATH SLEND SS 6F .021 (SHEATH) ×3 IMPLANT
KIT HEART LEFT (KITS) ×3 IMPLANT
PACK CARDIAC CATHETERIZATION (CUSTOM PROCEDURE TRAY) ×3 IMPLANT
SYR MEDRAD MARK V 150ML (SYRINGE) ×3 IMPLANT
TRANSDUCER W/STOPCOCK (MISCELLANEOUS) ×3 IMPLANT
TUBING CIL FLEX 10 FLL-RA (TUBING) ×3 IMPLANT
WIRE HI TORQ VERSACORE-J 145CM (WIRE) ×3 IMPLANT
WIRE SAFE-T 1.5MM-J .035X260CM (WIRE) ×3 IMPLANT

## 2015-06-20 NOTE — Interval H&P Note (Signed)
History and Physical Interval Note:  06/20/2015 11:16 AM  Rudy Jew  has presented today for surgery, with the diagnosis of cp  The various methods of treatment have been discussed with the patient and family. After consideration of risks, benefits and other options for treatment, the patient has consented to  Procedure(s): Left Heart Cath and Cors/Grafts Angiography (N/A) as a surgical intervention .  The patient's history has been reviewed, patient examined, no change in status, stable for surgery.  I have reviewed the patient's chart and labs.  Questions were answered to the patient's satisfaction.    Cath Lab Visit (complete for each Cath Lab visit)  Clinical Evaluation Leading to the Procedure:   ACS: Yes.    Non-ACS:    Anginal Classification: CCS IV  Anti-ischemic medical therapy: Maximal Therapy (2 or more classes of medications)  Non-Invasive Test Results: No non-invasive testing performed  Prior CABG: Previous CABG       Collier Salina [facility] 06/20/2015 11:16 AM

## 2015-06-20 NOTE — Progress Notes (Signed)
Utilization review completed.  

## 2015-06-20 NOTE — Progress Notes (Signed)
ANTICOAGULATION CONSULT NOTE - Follow Up Consult  Pharmacy Consult for Heparin Indication: NSTEMI  Allergies  Allergen Reactions  . Bee Venom Anaphylaxis    Only yellow jackets   . Promethazine Hcl Other (See Comments)    syncope  . Doxycycline Hyclate Other (See Comments)    esophagitis  . Lipitor [Atorvastatin Calcium] Other (See Comments)    Myalgia/myopathy  . Other Diarrhea and Nausea And Vomiting    Seafood  . Tetracycline Other (See Comments)    Esophagitis  . Crestor [Rosuvastatin] Other (See Comments)    Myalgia/myopathy  . Tetanus Toxoid Rash    Patient Measurements: Height: 5\' 9"  (175.3 cm) Weight: 175 lb 8 oz (79.606 kg) IBW/kg (Calculated) : 70.7 Heparin Dosing Weight: 79.6 kg  Vital Signs: Temp: 97.8 F (36.6 C) (02/02 1550) Temp Source: Oral (02/02 1500) BP: 110/60 mmHg (02/02 1550) Pulse Rate: 56 (02/02 1550)  Labs:  Recent Labs  06/19/15 1124 06/19/15 1915 06/19/15 2220 06/20/15 0354 06/20/15 0420 06/20/15 0747  HGB 14.7 14.9  --  13.6  --   --   HCT 43.5 43.3  --  40.6  --   --   PLT 182 196  --  190  --   --   LABPROT  --   --   --  14.8  --   --   INR  --   --   --  1.14  --   --   HEPARINUNFRC  --   --   --   --   --  0.52  CREATININE 1.84* 1.80*  --  1.81*  --   --   TROPONINI  --  0.38* 1.74*  --  6.20*  --     Estimated Creatinine Clearance: 33.6 mL/min (by C-G formula based on Cr of 1.81).   Medications:  Scheduled:  . allopurinol  300 mg Oral Daily  . amiodarone  100 mg Oral Daily  . amLODipine  5 mg Oral Daily  . aspirin EC  81 mg Oral Daily  . cholecalciferol  2,000 Units Oral Daily  . heparin  5,000 Units Subcutaneous 3 times per day  . isosorbide mononitrate  60 mg Oral Daily  . levothyroxine  125 mcg Oral QAC breakfast  . multivitamin with minerals  1 tablet Oral QHS  . pantoprazole  40 mg Oral Daily  . pravastatin  20 mg Oral q1800  . sodium chloride flush  3 mL Intravenous Q12H  . tiotropium  18 mcg Inhalation  Daily   Infusions:  . sodium chloride 10 mL/hr (06/19/15 1656)    Assessment: 81 yo M admitted 2/1 with NSTEMI.  Pt is s/p cardiac cath today with no significant changes from previous cath in 2013.  Plan medical management.  Per PA will restart heparin and continue x 12 hours post-procedure.  TR band removed at 1600.  To restart heparin in 6 hours (2200 tonight)  Prior to cath pt was on heparin at 1000 units/hr with therapeutic level.  Goal of Therapy:  Heparin level 0.3-0.7 units/ml Monitor platelets by anticoagulation protocol: Yes   Plan:  Restart heparin at 1000 units/hr at 2200 tonight.   Heparin level and CBC with AM labs. Continue x 12 hours - stop time of 1000 AM 2/3.  [employer], Pharm.D., BCPS Clinical Pharmacist Pager [phone] 06/20/2015 6:33 PM

## 2015-06-20 NOTE — H&P (View-Only) (Signed)
SUBJECTIVE:  No chet pain.  Troponin positive overnight.  Heparin started.   OBJECTIVE:   Vitals:   Filed Vitals:   06/19/15 1830 06/19/15 1941 06/20/15 0610 06/20/15 0809  BP: 123/78 123/78 151/75   Pulse: 60 58 51   Temp:  97.8 F (36.6 C) 97.8 F (36.6 C)   TempSrc:  Oral Oral   Resp: 16 17 17    Height:  5\' 9"  (1.753 m)    Weight:  182 lb 15.7 oz (83 kg)  175 lb 7.8 oz (79.6 kg)  SpO2: 96% 96% 95% 95%   I&O's:  No intake or output data in the 24 hours ending 06/20/15 0933 TELEMETRY: Reviewed telemetry pt in NSR:     PHYSICAL EXAM General: Well developed, well nourished, in no acute distress Head:   Normal cephalic and atramatic  Lungs:   Clear bilaterally to auscultation. Heart:   HRRR S1 S2  No JVD.   Abdomen: abdomen soft and non-tender Msk:  Back normal,  Normal strength and tone for age. Extremities:   No edema.   Neuro: Alert and oriented. Psych:  Normal affect, responds appropriately Skin: No rash   LABS: Basic Metabolic Panel:  Recent Labs  06/19/15 1124 06/19/15 1915 06/20/15 0354  NA 140  --  143  K 4.6  --  5.0  CL 107  --  107  CO2 22  --  28  GLUCOSE 153*  --  109*  BUN 21*  --  19  CREATININE 1.84* 1.80* 1.81*  CALCIUM 9.7  --  9.5  MG  --  2.1  --    Liver Function Tests:  Recent Labs  06/19/15 1915  AST 22  ALT 18  ALKPHOS 112  BILITOT 0.8  PROT 7.3  ALBUMIN 3.9   No results for input(s): LIPASE, AMYLASE in the last 72 hours. CBC:  Recent Labs  06/19/15 1124 06/19/15 1915 06/20/15 0354  WBC 4.6 5.4 5.4  NEUTROABS 2.4  --   --   HGB 14.7 14.9 13.6  HCT 43.5 43.3 40.6  MCV 95.6 95.8 95.5  PLT 182 196 190   Cardiac Enzymes:  Recent Labs  06/19/15 1915 06/19/15 2220 06/20/15 0420  TROPONINI 0.38* 1.74* 6.20*   BNP: Invalid input(s): POCBNP D-Dimer: No results for input(s): DDIMER in the last 72 hours. Hemoglobin A1C:  Recent Labs  06/19/15 1915  HGBA1C 5.7*   Fasting Lipid Panel:  Recent Labs  06/20/15 0354  CHOL 159  HDL 44  LDLCALC 85  TRIG 151*  CHOLHDL 3.6   Thyroid Function Tests:  Recent Labs  06/19/15 1915  TSH 1.654   Anemia Panel: No results for input(s): VITAMINB12, FOLATE, FERRITIN, TIBC, IRON, RETICCTPCT in the last 72 hours. Coag Panel:   Lab Results  Component Value Date   INR 1.14 06/20/2015   INR 1.37 02/12/2014   INR 1.31 04/28/2012    RADIOLOGY: Dg Chest 2 View  06/19/2015  CLINICAL DATA:  Chest pain EXAM: CHEST  2 VIEW COMPARISON:  02/12/2015. FINDINGS: The lungs are clear wiithout focal pneumonia, edema, pneumothorax or pleural effusion. Stable linear scarring left lung base. The cardiopericardial silhouette is within normal limits for size. Patient is status post CABG. Superior endplate compression fracture noted at L1. IMPRESSION: No active cardiopulmonary disease. Electronically Signed   By: Misty Stanley M.D.   On: 06/19/2015 12:04   Ct Angio Chest Aorta W/cm &/or Wo/cm  06/19/2015  CLINICAL DATA:  Sharp chest and back pain.  Nausea and weakness. EXAM: CT ANGIOGRAPHY CHEST CTA ABDOMEN AND PELVIS WITHOUT AND WITH CONTRAST TECHNIQUE: Multidetector CT imaging of the chest, abdomen and pelvis was performed using the standard protocol during bolus administration of intravenous contrast. Multiplanar CT image reconstructions and MIPs were obtained to evaluate the vascular anatomy. CONTRAST:  36mL OMNIPAQUE IOHEXOL 350 MG/ML SOLN COMPARISON:  Chest radiograph 06/19/2015, CT of the chest 09/22/2010 FINDINGS: CTA CHEST FINDINGS Status post CABG. Right main coronary artery and left circumflex grafts are patent. Calcified atherosclerotic disease of the native coronary arteries noted. Moderate in severity calcified and noncalcified plaque of the aorta and tortuosity is seen, without evidence of dissection. There is no evidence of pulmonary embolus. No axillary lymphadenopathy is seen. The visualized portions of the thyroid gland are unremarkable in appearance. The  heart is enlarged with preferential right heart enlargement. No pericardial effusion. Mild left apical subpleural scarring. Lingular linear subpleural thickening likely also corresponds to scarring. There is no evidence of significant focal lung parenchymal consolidation, pleural effusion or pneumothorax. No masses are identified ; no abnormal focal contrast enhancement is seen. No acute osseous abnormalities are seen. CT ABDOMEN and PELVIS FINDINGS The liver, spleen, adrenals, pancreas, kidneys are unremarkable. There is no evidence of bowel obstruction, enteritis, colitis, diverticulitis, nor appendicitis. The appendix is identified and is unremarkable. A moderate amount of fecal retention is appreciated within the colon. There is no evidence of abdominal aortic aneurysm. There is calcified and nonaggressive of plaque along the abdominal aorta and iliac arteries. Evidence of soft plaque ulceration is seen within the mid right common iliac artery ( image 218/ series 6 and image 80/sequence 10). Mild fusiform dilation of the right common iliac artery measuring 18 mm in maximum diameter is seen at the same level. The celiac, SMA, IMA, portal vein, SMV are opacified. Coarse calcifications are seen at the takeoff of celiac trunk, SMA, bilateral renal arteries, without flow limiting stenosis. There is no evidence of abdominal or pelvic free fluid, loculated fluid collections, masses, nor adenopathy scant mural calcifications identified within the aorta and mesenteric vessels. There is no evidence of abdominal wall nor inguinal hernia. There is mild colonic diverticulosis without evidence of diverticulitis the appendix is normal. The urinary bladder is distended. Prostate gland appears mildly prominent and abutting the posterior bladder wall. There is no evidence of aggressive appearing osseous lesions. There is mild S shape spine scoliosis. Multilevel osteoarthritic changes are seen, worse at T12-L1, with there is a  superior endplate compression deformity with approximately 40% height loss, likely degenerative. L1-L2, L2-L3 also demonstrate advanced osteoarthritic changes. Significant posterior facet arthropathy of the lower lumbosacral spine. Review of the MIP images confirms the above findings. IMPRESSION: No evidence of aortic dissection or pulmonary embolus. Mild cardiomegaly. Post CABG with patent graft to the main right coronary artery and left circumflex artery. Tortuosity and atherosclerotic disease of the aorta with calcified and noncalcified plaque. Bulky calcifications at the takeoff of the major aortic abdominal branches without evidence of flow limiting stenosis. An area of mild fusiform dilation of the mid right iliac artery and focal soft plaque ulceration. No acute abnormalities within the solid abdominal organs. Diffuse colonic diverticulosis without evidence of diverticulitis. Mild prominence of the prostate gland, impinging on the posterior wall of the urinary bladder. Correlation to PCAs recommended. Multilevel osteoarthritic changes of the spine. Likely degenerative superior endplate L1 compression fracture with approximately 40% height loss. Electronically Signed   By: Fidela Salisbury M.D.   On: 06/19/2015 14:12  Ct Angio Abd/pel W/ And/or W/o  06/19/2015  CLINICAL DATA:  Sharp chest and back pain.  Nausea and weakness. EXAM: CT ANGIOGRAPHY CHEST CTA ABDOMEN AND PELVIS WITHOUT AND WITH CONTRAST TECHNIQUE: Multidetector CT imaging of the chest, abdomen and pelvis was performed using the standard protocol during bolus administration of intravenous contrast. Multiplanar CT image reconstructions and MIPs were obtained to evaluate the vascular anatomy. CONTRAST:  67mL OMNIPAQUE IOHEXOL 350 MG/ML SOLN COMPARISON:  Chest radiograph 06/19/2015, CT of the chest 09/22/2010 FINDINGS: CTA CHEST FINDINGS Status post CABG. Right main coronary artery and left circumflex grafts are patent. Calcified atherosclerotic  disease of the native coronary arteries noted. Moderate in severity calcified and noncalcified plaque of the aorta and tortuosity is seen, without evidence of dissection. There is no evidence of pulmonary embolus. No axillary lymphadenopathy is seen. The visualized portions of the thyroid gland are unremarkable in appearance. The heart is enlarged with preferential right heart enlargement. No pericardial effusion. Mild left apical subpleural scarring. Lingular linear subpleural thickening likely also corresponds to scarring. There is no evidence of significant focal lung parenchymal consolidation, pleural effusion or pneumothorax. No masses are identified ; no abnormal focal contrast enhancement is seen. No acute osseous abnormalities are seen. CT ABDOMEN and PELVIS FINDINGS The liver, spleen, adrenals, pancreas, kidneys are unremarkable. There is no evidence of bowel obstruction, enteritis, colitis, diverticulitis, nor appendicitis. The appendix is identified and is unremarkable. A moderate amount of fecal retention is appreciated within the colon. There is no evidence of abdominal aortic aneurysm. There is calcified and nonaggressive of plaque along the abdominal aorta and iliac arteries. Evidence of soft plaque ulceration is seen within the mid right common iliac artery ( image 218/ series 6 and image 80/sequence 10). Mild fusiform dilation of the right common iliac artery measuring 18 mm in maximum diameter is seen at the same level. The celiac, SMA, IMA, portal vein, SMV are opacified. Coarse calcifications are seen at the takeoff of celiac trunk, SMA, bilateral renal arteries, without flow limiting stenosis. There is no evidence of abdominal or pelvic free fluid, loculated fluid collections, masses, nor adenopathy scant mural calcifications identified within the aorta and mesenteric vessels. There is no evidence of abdominal wall nor inguinal hernia. There is mild colonic diverticulosis without evidence of  diverticulitis the appendix is normal. The urinary bladder is distended. Prostate gland appears mildly prominent and abutting the posterior bladder wall. There is no evidence of aggressive appearing osseous lesions. There is mild S shape spine scoliosis. Multilevel osteoarthritic changes are seen, worse at T12-L1, with there is a superior endplate compression deformity with approximately 40% height loss, likely degenerative. L1-L2, L2-L3 also demonstrate advanced osteoarthritic changes. Significant posterior facet arthropathy of the lower lumbosacral spine. Review of the MIP images confirms the above findings. IMPRESSION: No evidence of aortic dissection or pulmonary embolus. Mild cardiomegaly. Post CABG with patent graft to the main right coronary artery and left circumflex artery. Tortuosity and atherosclerotic disease of the aorta with calcified and noncalcified plaque. Bulky calcifications at the takeoff of the major aortic abdominal branches without evidence of flow limiting stenosis. An area of mild fusiform dilation of the mid right iliac artery and focal soft plaque ulceration. No acute abnormalities within the solid abdominal organs. Diffuse colonic diverticulosis without evidence of diverticulitis. Mild prominence of the prostate gland, impinging on the posterior wall of the urinary bladder. Correlation to PCAs recommended. Multilevel osteoarthritic changes of the spine. Likely degenerative superior endplate L1 compression fracture with approximately 40%  height loss. Electronically Signed   By: Fidela Salisbury M.D.   On: 06/19/2015 14:12      ASSESSMENT: / PLAN:   1) NSTEMI:  Plan for cath today.  Discussed with th epatient and his wife.  They are agreeable.  All questions answered.  Continue IV heparin.  Known thrombotic occlusion of an OM graft in 2013.  Medically managed at that time.    HTN: Amlodipine started.   CRI: Aggressive hydration started.   Jettie Booze, MD  06/20/2015    9:33 AM

## 2015-06-20 NOTE — Progress Notes (Signed)
Noticed that Troponin is now 6.20 (drawn 2/2 at 0420).  Did not receive call from the lab.  Notified Dr. Aundra Dubin via text page.  Omar Benson

## 2015-06-20 NOTE — Progress Notes (Signed)
TR band removed. Pt tolerated it well. Vital signs taken. Will continue to monitor.

## 2015-06-20 NOTE — Progress Notes (Signed)
SUBJECTIVE:  No chet pain.  Troponin positive overnight.  Heparin started.   OBJECTIVE:   Vitals:   Filed Vitals:   06/19/15 1830 06/19/15 1941 06/20/15 0610 06/20/15 0809  BP: 123/78 123/78 151/75   Pulse: 60 58 51   Temp:  97.8 F (36.6 C) 97.8 F (36.6 C)   TempSrc:  Oral Oral   Resp: 16 17 17    Height:  5\' 9"  (1.753 m)    Weight:  182 lb 15.7 oz (83 kg)  175 lb 7.8 oz (79.6 kg)  SpO2: 96% 96% 95% 95%   I&O's:  No intake or output data in the 24 hours ending 06/20/15 0933 TELEMETRY: Reviewed telemetry pt in NSR:     PHYSICAL EXAM General: Well developed, well nourished, in no acute distress Head:   Normal cephalic and atramatic  Lungs:   Clear bilaterally to auscultation. Heart:   HRRR S1 S2  No JVD.   Abdomen: abdomen soft and non-tender Msk:  Back normal,  Normal strength and tone for age. Extremities:   No edema.   Neuro: Alert and oriented. Psych:  Normal affect, responds appropriately Skin: No rash   LABS: Basic Metabolic Panel:  Recent Labs  06/19/15 1124 06/19/15 1915 06/20/15 0354  NA 140  --  143  K 4.6  --  5.0  CL 107  --  107  CO2 22  --  28  GLUCOSE 153*  --  109*  BUN 21*  --  19  CREATININE 1.84* 1.80* 1.81*  CALCIUM 9.7  --  9.5  MG  --  2.1  --    Liver Function Tests:  Recent Labs  06/19/15 1915  AST 22  ALT 18  ALKPHOS 112  BILITOT 0.8  PROT 7.3  ALBUMIN 3.9   No results for input(s): LIPASE, AMYLASE in the last 72 hours. CBC:  Recent Labs  06/19/15 1124 06/19/15 1915 06/20/15 0354  WBC 4.6 5.4 5.4  NEUTROABS 2.4  --   --   HGB 14.7 14.9 13.6  HCT 43.5 43.3 40.6  MCV 95.6 95.8 95.5  PLT 182 196 190   Cardiac Enzymes:  Recent Labs  06/19/15 1915 06/19/15 2220 06/20/15 0420  TROPONINI 0.38* 1.74* 6.20*   BNP: Invalid input(s): POCBNP D-Dimer: No results for input(s): DDIMER in the last 72 hours. Hemoglobin A1C:  Recent Labs  06/19/15 1915  HGBA1C 5.7*   Fasting Lipid Panel:  Recent Labs  06/20/15 0354  CHOL 159  HDL 44  LDLCALC 85  TRIG 151*  CHOLHDL 3.6   Thyroid Function Tests:  Recent Labs  06/19/15 1915  TSH 1.654   Anemia Panel: No results for input(s): VITAMINB12, FOLATE, FERRITIN, TIBC, IRON, RETICCTPCT in the last 72 hours. Coag Panel:   Lab Results  Component Value Date   INR 1.14 06/20/2015   INR 1.37 02/12/2014   INR 1.31 04/28/2012    RADIOLOGY: Dg Chest 2 View  06/19/2015  CLINICAL DATA:  Chest pain EXAM: CHEST  2 VIEW COMPARISON:  02/12/2015. FINDINGS: The lungs are clear wiithout focal pneumonia, edema, pneumothorax or pleural effusion. Stable linear scarring left lung base. The cardiopericardial silhouette is within normal limits for size. Patient is status post CABG. Superior endplate compression fracture noted at L1. IMPRESSION: No active cardiopulmonary disease. Electronically Signed   By: Misty Stanley M.D.   On: 06/19/2015 12:04   Ct Angio Chest Aorta W/cm &/or Wo/cm  06/19/2015  CLINICAL DATA:  Sharp chest and back pain.  Nausea and weakness. EXAM: CT ANGIOGRAPHY CHEST CTA ABDOMEN AND PELVIS WITHOUT AND WITH CONTRAST TECHNIQUE: Multidetector CT imaging of the chest, abdomen and pelvis was performed using the standard protocol during bolus administration of intravenous contrast. Multiplanar CT image reconstructions and MIPs were obtained to evaluate the vascular anatomy. CONTRAST:  43mL OMNIPAQUE IOHEXOL 350 MG/ML SOLN COMPARISON:  Chest radiograph 06/19/2015, CT of the chest 09/22/2010 FINDINGS: CTA CHEST FINDINGS Status post CABG. Right main coronary artery and left circumflex grafts are patent. Calcified atherosclerotic disease of the native coronary arteries noted. Moderate in severity calcified and noncalcified plaque of the aorta and tortuosity is seen, without evidence of dissection. There is no evidence of pulmonary embolus. No axillary lymphadenopathy is seen. The visualized portions of the thyroid gland are unremarkable in appearance. The  heart is enlarged with preferential right heart enlargement. No pericardial effusion. Mild left apical subpleural scarring. Lingular linear subpleural thickening likely also corresponds to scarring. There is no evidence of significant focal lung parenchymal consolidation, pleural effusion or pneumothorax. No masses are identified ; no abnormal focal contrast enhancement is seen. No acute osseous abnormalities are seen. CT ABDOMEN and PELVIS FINDINGS The liver, spleen, adrenals, pancreas, kidneys are unremarkable. There is no evidence of bowel obstruction, enteritis, colitis, diverticulitis, nor appendicitis. The appendix is identified and is unremarkable. A moderate amount of fecal retention is appreciated within the colon. There is no evidence of abdominal aortic aneurysm. There is calcified and nonaggressive of plaque along the abdominal aorta and iliac arteries. Evidence of soft plaque ulceration is seen within the mid right common iliac artery ( image 218/ series 6 and image 80/sequence 10). Mild fusiform dilation of the right common iliac artery measuring 18 mm in maximum diameter is seen at the same level. The celiac, SMA, IMA, portal vein, SMV are opacified. Coarse calcifications are seen at the takeoff of celiac trunk, SMA, bilateral renal arteries, without flow limiting stenosis. There is no evidence of abdominal or pelvic free fluid, loculated fluid collections, masses, nor adenopathy scant mural calcifications identified within the aorta and mesenteric vessels. There is no evidence of abdominal wall nor inguinal hernia. There is mild colonic diverticulosis without evidence of diverticulitis the appendix is normal. The urinary bladder is distended. Prostate gland appears mildly prominent and abutting the posterior bladder wall. There is no evidence of aggressive appearing osseous lesions. There is mild S shape spine scoliosis. Multilevel osteoarthritic changes are seen, worse at T12-L1, with there is a  superior endplate compression deformity with approximately 40% height loss, likely degenerative. L1-L2, L2-L3 also demonstrate advanced osteoarthritic changes. Significant posterior facet arthropathy of the lower lumbosacral spine. Review of the MIP images confirms the above findings. IMPRESSION: No evidence of aortic dissection or pulmonary embolus. Mild cardiomegaly. Post CABG with patent graft to the main right coronary artery and left circumflex artery. Tortuosity and atherosclerotic disease of the aorta with calcified and noncalcified plaque. Bulky calcifications at the takeoff of the major aortic abdominal branches without evidence of flow limiting stenosis. An area of mild fusiform dilation of the mid right iliac artery and focal soft plaque ulceration. No acute abnormalities within the solid abdominal organs. Diffuse colonic diverticulosis without evidence of diverticulitis. Mild prominence of the prostate gland, impinging on the posterior wall of the urinary bladder. Correlation to PCAs recommended. Multilevel osteoarthritic changes of the spine. Likely degenerative superior endplate L1 compression fracture with approximately 40% height loss. Electronically Signed   By: Fidela Salisbury M.D.   On: 06/19/2015 14:12  Ct Angio Abd/pel W/ And/or W/o  06/19/2015  CLINICAL DATA:  Sharp chest and back pain.  Nausea and weakness. EXAM: CT ANGIOGRAPHY CHEST CTA ABDOMEN AND PELVIS WITHOUT AND WITH CONTRAST TECHNIQUE: Multidetector CT imaging of the chest, abdomen and pelvis was performed using the standard protocol during bolus administration of intravenous contrast. Multiplanar CT image reconstructions and MIPs were obtained to evaluate the vascular anatomy. CONTRAST:  67mL OMNIPAQUE IOHEXOL 350 MG/ML SOLN COMPARISON:  Chest radiograph 06/19/2015, CT of the chest 09/22/2010 FINDINGS: CTA CHEST FINDINGS Status post CABG. Right main coronary artery and left circumflex grafts are patent. Calcified atherosclerotic  disease of the native coronary arteries noted. Moderate in severity calcified and noncalcified plaque of the aorta and tortuosity is seen, without evidence of dissection. There is no evidence of pulmonary embolus. No axillary lymphadenopathy is seen. The visualized portions of the thyroid gland are unremarkable in appearance. The heart is enlarged with preferential right heart enlargement. No pericardial effusion. Mild left apical subpleural scarring. Lingular linear subpleural thickening likely also corresponds to scarring. There is no evidence of significant focal lung parenchymal consolidation, pleural effusion or pneumothorax. No masses are identified ; no abnormal focal contrast enhancement is seen. No acute osseous abnormalities are seen. CT ABDOMEN and PELVIS FINDINGS The liver, spleen, adrenals, pancreas, kidneys are unremarkable. There is no evidence of bowel obstruction, enteritis, colitis, diverticulitis, nor appendicitis. The appendix is identified and is unremarkable. A moderate amount of fecal retention is appreciated within the colon. There is no evidence of abdominal aortic aneurysm. There is calcified and nonaggressive of plaque along the abdominal aorta and iliac arteries. Evidence of soft plaque ulceration is seen within the mid right common iliac artery ( image 218/ series 6 and image 80/sequence 10). Mild fusiform dilation of the right common iliac artery measuring 18 mm in maximum diameter is seen at the same level. The celiac, SMA, IMA, portal vein, SMV are opacified. Coarse calcifications are seen at the takeoff of celiac trunk, SMA, bilateral renal arteries, without flow limiting stenosis. There is no evidence of abdominal or pelvic free fluid, loculated fluid collections, masses, nor adenopathy scant mural calcifications identified within the aorta and mesenteric vessels. There is no evidence of abdominal wall nor inguinal hernia. There is mild colonic diverticulosis without evidence of  diverticulitis the appendix is normal. The urinary bladder is distended. Prostate gland appears mildly prominent and abutting the posterior bladder wall. There is no evidence of aggressive appearing osseous lesions. There is mild S shape spine scoliosis. Multilevel osteoarthritic changes are seen, worse at T12-L1, with there is a superior endplate compression deformity with approximately 40% height loss, likely degenerative. L1-L2, L2-L3 also demonstrate advanced osteoarthritic changes. Significant posterior facet arthropathy of the lower lumbosacral spine. Review of the MIP images confirms the above findings. IMPRESSION: No evidence of aortic dissection or pulmonary embolus. Mild cardiomegaly. Post CABG with patent graft to the main right coronary artery and left circumflex artery. Tortuosity and atherosclerotic disease of the aorta with calcified and noncalcified plaque. Bulky calcifications at the takeoff of the major aortic abdominal branches without evidence of flow limiting stenosis. An area of mild fusiform dilation of the mid right iliac artery and focal soft plaque ulceration. No acute abnormalities within the solid abdominal organs. Diffuse colonic diverticulosis without evidence of diverticulitis. Mild prominence of the prostate gland, impinging on the posterior wall of the urinary bladder. Correlation to PCAs recommended. Multilevel osteoarthritic changes of the spine. Likely degenerative superior endplate L1 compression fracture with approximately 40%  height loss. Electronically Signed   By: Fidela Salisbury M.D.   On: 06/19/2015 14:12      ASSESSMENT: / PLAN:   1) NSTEMI:  Plan for cath today.  Discussed with th epatient and his wife.  They are agreeable.  All questions answered.  Continue IV heparin.  Known thrombotic occlusion of an OM graft in 2013.  Medically managed at that time.    HTN: Amlodipine started.   CRI: Aggressive hydration started.   Jettie Booze, MD  06/20/2015    9:33 AM

## 2015-06-21 ENCOUNTER — Encounter (HOSPITAL_COMMUNITY): Payer: Self-pay | Admitting: Physician Assistant

## 2015-06-21 ENCOUNTER — Telehealth: Payer: Self-pay | Admitting: Internal Medicine

## 2015-06-21 DIAGNOSIS — E785 Hyperlipidemia, unspecified: Secondary | ICD-10-CM

## 2015-06-21 LAB — HEPARIN LEVEL (UNFRACTIONATED): Heparin Unfractionated: 0.26 IU/mL — ABNORMAL LOW (ref 0.30–0.70)

## 2015-06-21 LAB — TROPONIN I
TROPONIN I: 2.49 ng/mL — AB (ref <0.031)
Troponin I: 1.79 ng/mL (ref <0.031)

## 2015-06-21 LAB — CBC
HCT: 38.1 % — ABNORMAL LOW (ref 39.0–52.0)
Hemoglobin: 13.1 g/dL (ref 13.0–17.0)
MCH: 32.7 pg (ref 26.0–34.0)
MCHC: 34.4 g/dL (ref 30.0–36.0)
MCV: 95 fL (ref 78.0–100.0)
PLATELETS: 155 10*3/uL (ref 150–400)
RBC: 4.01 MIL/uL — ABNORMAL LOW (ref 4.22–5.81)
RDW: 14 % (ref 11.5–15.5)
WBC: 4.4 10*3/uL (ref 4.0–10.5)

## 2015-06-21 MED ORDER — ISOSORBIDE MONONITRATE ER 60 MG PO TB24: 60.0000 mg | ORAL_TABLET | Freq: Every day | ORAL | Status: DC

## 2015-06-21 MED ORDER — AMLODIPINE BESYLATE 5 MG PO TABS: 5.0000 mg | ORAL_TABLET | Freq: Every day | ORAL | Status: DC

## 2015-06-21 MED ORDER — PRAVASTATIN SODIUM 20 MG PO TABS: 20.0000 mg | ORAL_TABLET | Freq: Every day | ORAL | Status: DC

## 2015-06-21 NOTE — Discharge Instructions (Signed)

## 2015-06-21 NOTE — Progress Notes (Signed)
SUBJECTIVE:  No further chest pain  OBJECTIVE:   Vitals:   Filed Vitals:   06/20/15 1546 06/20/15 1550 06/20/15 1953 06/21/15 0503  BP: 101/56 110/60 107/62 112/62  Pulse: 54 56 56 82  Temp:  97.8 F (36.6 C) 98.1 F (36.7 C) 97.9 F (36.6 C)  TempSrc:   Oral Oral  Resp:      Height:      Weight:      SpO2:  97% 96% 94%   I&O's:   Intake/Output Summary (Last 24 hours) at 06/21/15 1005 Last data filed at 06/21/15 0758  Gross per 24 hour  Intake    480 ml  Output      0 ml  Net    480 ml   TELEMETRY: Reviewed telemetry pt in sinus bradycardia:     PHYSICAL EXAM General: Well developed, well nourished, in no acute distress Head:   Normal cephalic and atramatic  Lungs:   Clear bilaterally to auscultation. Heart:   HRRR S1 S2  No JVD.   Abdomen: abdomen soft and non-tender Msk:  Back normal,  Normal strength and tone for age. Extremities:   No edema.  2+ left radial pulse. No hematoma Neuro: Alert and oriented. Psych:  Normal affect, responds appropriately Skin: No rash   LABS: Basic Metabolic Panel:  Recent Labs  06/19/15 1124 06/19/15 1915 06/20/15 0354  NA 140  --  143  K 4.6  --  5.0  CL 107  --  107  CO2 22  --  28  GLUCOSE 153*  --  109*  BUN 21*  --  19  CREATININE 1.84* 1.80* 1.81*  CALCIUM 9.7  --  9.5  MG  --  2.1  --    Liver Function Tests:  Recent Labs  06/19/15 1915  AST 22  ALT 18  ALKPHOS 112  BILITOT 0.8  PROT 7.3  ALBUMIN 3.9   No results for input(s): LIPASE, AMYLASE in the last 72 hours. CBC:  Recent Labs  06/19/15 1124  06/20/15 0354 06/21/15 0643  WBC 4.6  < > 5.4 4.4  NEUTROABS 2.4  --   --   --   HGB 14.7  < > 13.6 13.1  HCT 43.5  < > 40.6 38.1*  MCV 95.6  < > 95.5 95.0  PLT 182  < > 190 155  < > = values in this interval not displayed. Cardiac Enzymes:  Recent Labs  06/20/15 1856 06/21/15 0004 06/21/15 0643  TROPONINI 2.59* 2.49* 1.79*   BNP: Invalid input(s): POCBNP D-Dimer: No results for  input(s): DDIMER in the last 72 hours. Hemoglobin A1C:  Recent Labs  06/19/15 1915  HGBA1C 5.7*   Fasting Lipid Panel:  Recent Labs  06/20/15 0354  CHOL 159  HDL 44  LDLCALC 85  TRIG 151*  CHOLHDL 3.6   Thyroid Function Tests:  Recent Labs  06/19/15 1915  TSH 1.654   Anemia Panel: No results for input(s): VITAMINB12, FOLATE, FERRITIN, TIBC, IRON, RETICCTPCT in the last 72 hours. Coag Panel:   Lab Results  Component Value Date   INR 1.14 06/20/2015   INR 1.37 02/12/2014   INR 1.31 04/28/2012    RADIOLOGY: Dg Chest 2 View  06/19/2015  CLINICAL DATA:  Chest pain EXAM: CHEST  2 VIEW COMPARISON:  02/12/2015. FINDINGS: The lungs are clear wiithout focal pneumonia, edema, pneumothorax or pleural effusion. Stable linear scarring left lung base. The cardiopericardial silhouette is within normal limits for size. Patient is  status post CABG. Superior endplate compression fracture noted at L1. IMPRESSION: No active cardiopulmonary disease. Electronically Signed   By: Misty Stanley M.D.   On: 06/19/2015 12:04   Ct Angio Chest Aorta W/cm &/or Wo/cm  06/19/2015  CLINICAL DATA:  Sharp chest and back pain.  Nausea and weakness. EXAM: CT ANGIOGRAPHY CHEST CTA ABDOMEN AND PELVIS WITHOUT AND WITH CONTRAST TECHNIQUE: Multidetector CT imaging of the chest, abdomen and pelvis was performed using the standard protocol during bolus administration of intravenous contrast. Multiplanar CT image reconstructions and MIPs were obtained to evaluate the vascular anatomy. CONTRAST:  24mL OMNIPAQUE IOHEXOL 350 MG/ML SOLN COMPARISON:  Chest radiograph 06/19/2015, CT of the chest 09/22/2010 FINDINGS: CTA CHEST FINDINGS Status post CABG. Right main coronary artery and left circumflex grafts are patent. Calcified atherosclerotic disease of the native coronary arteries noted. Moderate in severity calcified and noncalcified plaque of the aorta and tortuosity is seen, without evidence of dissection. There is no evidence  of pulmonary embolus. No axillary lymphadenopathy is seen. The visualized portions of the thyroid gland are unremarkable in appearance. The heart is enlarged with preferential right heart enlargement. No pericardial effusion. Mild left apical subpleural scarring. Lingular linear subpleural thickening likely also corresponds to scarring. There is no evidence of significant focal lung parenchymal consolidation, pleural effusion or pneumothorax. No masses are identified ; no abnormal focal contrast enhancement is seen. No acute osseous abnormalities are seen. CT ABDOMEN and PELVIS FINDINGS The liver, spleen, adrenals, pancreas, kidneys are unremarkable. There is no evidence of bowel obstruction, enteritis, colitis, diverticulitis, nor appendicitis. The appendix is identified and is unremarkable. A moderate amount of fecal retention is appreciated within the colon. There is no evidence of abdominal aortic aneurysm. There is calcified and nonaggressive of plaque along the abdominal aorta and iliac arteries. Evidence of soft plaque ulceration is seen within the mid right common iliac artery ( image 218/ series 6 and image 80/sequence 10). Mild fusiform dilation of the right common iliac artery measuring 18 mm in maximum diameter is seen at the same level. The celiac, SMA, IMA, portal vein, SMV are opacified. Coarse calcifications are seen at the takeoff of celiac trunk, SMA, bilateral renal arteries, without flow limiting stenosis. There is no evidence of abdominal or pelvic free fluid, loculated fluid collections, masses, nor adenopathy scant mural calcifications identified within the aorta and mesenteric vessels. There is no evidence of abdominal wall nor inguinal hernia. There is mild colonic diverticulosis without evidence of diverticulitis the appendix is normal. The urinary bladder is distended. Prostate gland appears mildly prominent and abutting the posterior bladder wall. There is no evidence of aggressive  appearing osseous lesions. There is mild S shape spine scoliosis. Multilevel osteoarthritic changes are seen, worse at T12-L1, with there is a superior endplate compression deformity with approximately 40% height loss, likely degenerative. L1-L2, L2-L3 also demonstrate advanced osteoarthritic changes. Significant posterior facet arthropathy of the lower lumbosacral spine. Review of the MIP images confirms the above findings. IMPRESSION: No evidence of aortic dissection or pulmonary embolus. Mild cardiomegaly. Post CABG with patent graft to the main right coronary artery and left circumflex artery. Tortuosity and atherosclerotic disease of the aorta with calcified and noncalcified plaque. Bulky calcifications at the takeoff of the major aortic abdominal branches without evidence of flow limiting stenosis. An area of mild fusiform dilation of the mid right iliac artery and focal soft plaque ulceration. No acute abnormalities within the solid abdominal organs. Diffuse colonic diverticulosis without evidence of diverticulitis. Mild prominence of  the prostate gland, impinging on the posterior wall of the urinary bladder. Correlation to PCAs recommended. Multilevel osteoarthritic changes of the spine. Likely degenerative superior endplate L1 compression fracture with approximately 40% height loss. Electronically Signed   By: Fidela Salisbury M.D.   On: 06/19/2015 14:12   Ct Angio Abd/pel W/ And/or W/o  06/19/2015  CLINICAL DATA:  Sharp chest and back pain.  Nausea and weakness. EXAM: CT ANGIOGRAPHY CHEST CTA ABDOMEN AND PELVIS WITHOUT AND WITH CONTRAST TECHNIQUE: Multidetector CT imaging of the chest, abdomen and pelvis was performed using the standard protocol during bolus administration of intravenous contrast. Multiplanar CT image reconstructions and MIPs were obtained to evaluate the vascular anatomy. CONTRAST:  22mL OMNIPAQUE IOHEXOL 350 MG/ML SOLN COMPARISON:  Chest radiograph 06/19/2015, CT of the chest  09/22/2010 FINDINGS: CTA CHEST FINDINGS Status post CABG. Right main coronary artery and left circumflex grafts are patent. Calcified atherosclerotic disease of the native coronary arteries noted. Moderate in severity calcified and noncalcified plaque of the aorta and tortuosity is seen, without evidence of dissection. There is no evidence of pulmonary embolus. No axillary lymphadenopathy is seen. The visualized portions of the thyroid gland are unremarkable in appearance. The heart is enlarged with preferential right heart enlargement. No pericardial effusion. Mild left apical subpleural scarring. Lingular linear subpleural thickening likely also corresponds to scarring. There is no evidence of significant focal lung parenchymal consolidation, pleural effusion or pneumothorax. No masses are identified ; no abnormal focal contrast enhancement is seen. No acute osseous abnormalities are seen. CT ABDOMEN and PELVIS FINDINGS The liver, spleen, adrenals, pancreas, kidneys are unremarkable. There is no evidence of bowel obstruction, enteritis, colitis, diverticulitis, nor appendicitis. The appendix is identified and is unremarkable. A moderate amount of fecal retention is appreciated within the colon. There is no evidence of abdominal aortic aneurysm. There is calcified and nonaggressive of plaque along the abdominal aorta and iliac arteries. Evidence of soft plaque ulceration is seen within the mid right common iliac artery ( image 218/ series 6 and image 80/sequence 10). Mild fusiform dilation of the right common iliac artery measuring 18 mm in maximum diameter is seen at the same level. The celiac, SMA, IMA, portal vein, SMV are opacified. Coarse calcifications are seen at the takeoff of celiac trunk, SMA, bilateral renal arteries, without flow limiting stenosis. There is no evidence of abdominal or pelvic free fluid, loculated fluid collections, masses, nor adenopathy scant mural calcifications identified within the  aorta and mesenteric vessels. There is no evidence of abdominal wall nor inguinal hernia. There is mild colonic diverticulosis without evidence of diverticulitis the appendix is normal. The urinary bladder is distended. Prostate gland appears mildly prominent and abutting the posterior bladder wall. There is no evidence of aggressive appearing osseous lesions. There is mild S shape spine scoliosis. Multilevel osteoarthritic changes are seen, worse at T12-L1, with there is a superior endplate compression deformity with approximately 40% height loss, likely degenerative. L1-L2, L2-L3 also demonstrate advanced osteoarthritic changes. Significant posterior facet arthropathy of the lower lumbosacral spine. Review of the MIP images confirms the above findings. IMPRESSION: No evidence of aortic dissection or pulmonary embolus. Mild cardiomegaly. Post CABG with patent graft to the main right coronary artery and left circumflex artery. Tortuosity and atherosclerotic disease of the aorta with calcified and noncalcified plaque. Bulky calcifications at the takeoff of the major aortic abdominal branches without evidence of flow limiting stenosis. An area of mild fusiform dilation of the mid right iliac artery and focal soft plaque ulceration.  No acute abnormalities within the solid abdominal organs. Diffuse colonic diverticulosis without evidence of diverticulitis. Mild prominence of the prostate gland, impinging on the posterior wall of the urinary bladder. Correlation to PCAs recommended. Multilevel osteoarthritic changes of the spine. Likely degenerative superior endplate L1 compression fracture with approximately 40% height loss. Electronically Signed   By: Fidela Salisbury M.D.   On: 06/19/2015 14:12      ASSESSMENT: / PLAN:    1) NSTEMI: medically treated. Troponin had the typical rise and fall of an acute coronary syndrome. Thrombotic graft which was present in 2013, still noted on catheterization done  yesterday. The etiology of his non-STEMI is unclear. There was no culprit vessel that appeared to be an easy target for revascularization. Will add amlodipine 5 mg daily to help with BP and to help relieve angina a well.  He is intolerant of statins.  Lipids are reasonably well controlled.   Amiodarone for paroxysmal atrial fibrillation. He has been in sinus bradycardia here. Would not add additional beta blocker.  He is quite active on his own. I asked him to increase his activity slowly after this event.  Jettie Booze, MD  06/21/2015  10:05 AM

## 2015-06-21 NOTE — Care Management Important Message (Signed)
Important Message  Patient Details  Name: Omar Benson MRN: [id number] Date of Birth: 1935-04-01   Medicare Important Message Given:  Yes    Nathen May 06/21/2015, 11:37 AM

## 2015-06-21 NOTE — Telephone Encounter (Signed)
06-21-15 2 week TCM fu appt per Rosaria Ferries with [facility] 07-05-15

## 2015-06-21 NOTE — Progress Notes (Signed)
Order received to discharge.  Telemetry removed and CCMD notified.  IV's to bilateral forearm's removed with catheters intact.  Discharge education given with wife at bedside.  Handout given on Pt new medication amlodipine.  All questions answered.  Pt denies chest pain or sob.  Pt stable at discharge, no s/s of distress.

## 2015-06-21 NOTE — Discharge Summary (Signed)
CARDIOLOGY DISCHARGE SUMMARY   Patient ID: Omar Benson MRN: [id number] DOB/AGE: 79/04/1935 71 y.o.  Admit date: 06/19/2015 Discharge date: 06/21/2015  PCP: [name], MD Primary Cardiologist: Dr. Lovena Le  Primary Discharge Diagnosis:  Unstable angina Secondary Discharge Diagnosis:    Hyperlipidemia LDL goal <70   Essential hypertension   Coronary atherosclerosis, CABG 2004   Aneurysm of abdominal vessel ([place])   CKD (chronic kidney disease) III   Procedures: Cardiac catheterization, coronary arteriogram, LIMA arteriogram, vein graft angiogram, CT angiogram of the chest abdomen and pelvis  Hospital Course: Omar Benson is a 81 y.o. male with a history of CABG in 2004. Last cath 2013 with patent LIMA to LAD, VG-RI, VG to PDA and PLA, subtotally occluded heavily thrombotic graft to smaller OM2, native second ramus with 50% narrowing. EF 60% no wall motion abnormality 2013. Hx aflutter w/ ablation, afib, AVM, sm AAA, COPD.   Omar Benson came to the emergency room on 06/19/2015 with upper back pain, pain under the tongue and into his jaw. He was weak. His ECG showed a new right bundle branch block with lateral ST depression. He was treated with IV nitroglycerin and morphine. His labs were stable. CT of the chest showed no dissection. He was admitted for further evaluation and treatment.  His troponins were elevated consistent with a non-STEMI. He was taken to the cath lab on 06/20/2015, results are below. He had severe disease in the SVG-OM 2, but medical therapy is the only option. He tolerated the procedure well.  His blood pressure was elevated. He was started on amlodipine 5 mg and BP improved. Imdur 60 mg was added for anti-anginal effects. He had not been on a statin due to S.E. From Lipitor and Crestor. He was started on Pravachol, hopefully will do well with this. LFTs are OK at baseline.  On 06/21/2015, he was seen by Dr. Beau Fanny and all data were reviewed. He is ambulating without  chest pain or shortness of breath. His vital signs are stable. His creatinine was up slightly on admission but unchanged after the procedures. This will be followed as an outpatient. No further inpatient workup is indicated and he is considered stable for discharge, to follow up as an outpatient.   BP 112/62 mmHg  Pulse 82  Temp(Src) 97.9 F (36.6 C) (Oral)  Resp 18  Ht 5\' 9"  (1.753 m)  Wt 175 lb 8 oz (79.606 kg)  BMI 25.90 kg/m2  SpO2 94% General: Well developed, well nourished, male in no acute distress Head: Eyes PERRLA, No xanthomas.   Normocephalic and atraumatic  Lungs: Clear bilaterally to auscultation. Heart: HRRR S1 S2, with M, no RG.  Pulses are 2+ & equal. No carotid bruit. No JVD. Abdomen: Bowel sounds are present, abdomen soft and non-tender without masses or  hernias noted. Msk: Normal strength and tone for age. Extremities: No clubbing, cyanosis or edema.    Skin:  No rashes or lesions noted. Neuro: Alert and oriented X 3. Psych:  Good affect, responds appropriately  Labs:   Lab Results  Component Value Date   WBC 4.4 06/21/2015   HGB 13.1 06/21/2015   HCT 38.1* 06/21/2015   MCV 95.0 06/21/2015   PLT 155 06/21/2015     Recent Labs Lab 06/19/15 1915 06/20/15 0354  NA  --  143  K  --  5.0  CL  --  107  CO2  --  28  BUN  --  19  CREATININE 1.80*  1.81*  CALCIUM  --  9.5  PROT 7.3  --   BILITOT 0.8  --   ALKPHOS 112  --   ALT 18  --   AST 22  --   GLUCOSE  --  109*    Recent Labs  06/20/15 1856 06/21/15 0004 06/21/15 0643  TROPONINI 2.59* 2.49* 1.79*   Lipid Panel     Component Value Date/Time   CHOL 159 06/20/2015 0354   TRIG 151* 06/20/2015 0354   HDL 44 06/20/2015 0354   CHOLHDL 3.6 06/20/2015 0354   VLDL 30 06/20/2015 0354   LDLCALC 85 06/20/2015 0354   LDLDIRECT 104.5 12/26/2013 1510    B NATRIURETIC PEPTIDE  Date/Time Value Ref Range Status  02/11/2015 07:30 PM 112.2* 0.0 - 100.0 pg/mL Final   PRO B NATRIURETIC PEPTIDE (BNP)    Date/Time Value Ref Range Status  09/23/2010 05:33 PM 139.0* 0.0 - 100.0 pg/mL Final    Recent Labs  06/20/15 0354  INR 1.14      Radiology: Dg Chest 2 View 06/19/2015  CLINICAL DATA:  Chest pain EXAM: CHEST  2 VIEW COMPARISON:  02/12/2015. FINDINGS: The lungs are clear wiithout focal pneumonia, edema, pneumothorax or pleural effusion. Stable linear scarring left lung base. The cardiopericardial silhouette is within normal limits for size. Patient is status post CABG. Superior endplate compression fracture noted at L1. IMPRESSION: No active cardiopulmonary disease. Electronically Signed   By: Misty Stanley M.D.   On: 06/19/2015 12:04   Ct Angio Chest Aorta W/cm &/or Wo/cm 06/19/2015  CLINICAL DATA:  Sharp chest and back pain.  Nausea and weakness. EXAM: CT ANGIOGRAPHY CHEST CTA ABDOMEN AND PELVIS WITHOUT AND WITH CONTRAST TECHNIQUE: Multidetector CT imaging of the chest, abdomen and pelvis was performed using the standard protocol during bolus administration of intravenous contrast. Multiplanar CT image reconstructions and MIPs were obtained to evaluate the vascular anatomy. CONTRAST:  46mL OMNIPAQUE IOHEXOL 350 MG/ML SOLN COMPARISON:  Chest radiograph 06/19/2015, CT of the chest 09/22/2010 FINDINGS: CTA CHEST FINDINGS Status post CABG. Right main coronary artery and left circumflex grafts are patent. Calcified atherosclerotic disease of the native coronary arteries noted. Moderate in severity calcified and noncalcified plaque of the aorta and tortuosity is seen, without evidence of dissection. There is no evidence of pulmonary embolus. No axillary lymphadenopathy is seen. The visualized portions of the thyroid gland are unremarkable in appearance. The heart is enlarged with preferential right heart enlargement. No pericardial effusion. Mild left apical subpleural scarring. Lingular linear subpleural thickening likely also corresponds to scarring. There is no evidence of significant focal lung  parenchymal consolidation, pleural effusion or pneumothorax. No masses are identified ; no abnormal focal contrast enhancement is seen. No acute osseous abnormalities are seen. CT ABDOMEN and PELVIS FINDINGS The liver, spleen, adrenals, pancreas, kidneys are unremarkable. There is no evidence of bowel obstruction, enteritis, colitis, diverticulitis, nor appendicitis. The appendix is identified and is unremarkable. A moderate amount of fecal retention is appreciated within the colon. There is no evidence of abdominal aortic aneurysm. There is calcified and nonaggressive of plaque along the abdominal aorta and iliac arteries. Evidence of soft plaque ulceration is seen within the mid right common iliac artery ( image 218/ series 6 and image 80/sequence 10). Mild fusiform dilation of the right common iliac artery measuring 18 mm in maximum diameter is seen at the same level. The celiac, SMA, IMA, portal vein, SMV are opacified. Coarse calcifications are seen at the takeoff of celiac trunk, SMA, bilateral renal  arteries, without flow limiting stenosis. There is no evidence of abdominal or pelvic free fluid, loculated fluid collections, masses, nor adenopathy scant mural calcifications identified within the aorta and mesenteric vessels. There is no evidence of abdominal wall nor inguinal hernia. There is mild colonic diverticulosis without evidence of diverticulitis the appendix is normal. The urinary bladder is distended. Prostate gland appears mildly prominent and abutting the posterior bladder wall. There is no evidence of aggressive appearing osseous lesions. There is mild S shape spine scoliosis. Multilevel osteoarthritic changes are seen, worse at T12-L1, with there is a superior endplate compression deformity with approximately 40% height loss, likely degenerative. L1-L2, L2-L3 also demonstrate advanced osteoarthritic changes. Significant posterior facet arthropathy of the lower lumbosacral spine. Review of the MIP  images confirms the above findings. IMPRESSION: No evidence of aortic dissection or pulmonary embolus. Mild cardiomegaly. Post CABG with patent graft to the main right coronary artery and left circumflex artery. Tortuosity and atherosclerotic disease of the aorta with calcified and noncalcified plaque. Bulky calcifications at the takeoff of the major aortic abdominal branches without evidence of flow limiting stenosis. An area of mild fusiform dilation of the mid right iliac artery and focal soft plaque ulceration. No acute abnormalities within the solid abdominal organs. Diffuse colonic diverticulosis without evidence of diverticulitis. Mild prominence of the prostate gland, impinging on the posterior wall of the urinary bladder. Correlation to PCAs recommended. Multilevel osteoarthritic changes of the spine. Likely degenerative superior endplate L1 compression fracture with approximately 40% height loss. Electronically Signed   By: Fidela Salisbury M.D.   On: 06/19/2015 14:12   Cardiac Cath: 06/20/2015  Prox RCA to Dist RCA lesion, 100% stenosed.  Ost LAD to Prox LAD lesion, 100% stenosed.  Ost Ramus to Ramus lesion, 30% stenosed.  Prox Cx to Mid Cx lesion, 100% stenosed.  SVG was injected .  The SVG to the OM is diffusely and severely diseased. There is a long segment of 70% stenosis in the proximal SVG. In the mid graft there is a severe 90% ulcerated lesion with a large filling defect consistent with thrombus. The distal graft has an aneurysmal segment with severe 90% disease and more filling defects consistent with thrombus.  Origin to Prox Graft lesion, 70% stenosed.  Mid Graft to Dist Graft lesion, 90% stenosed.  SVG was injected is normal in caliber, and is anatomically normal.  was injected is normal in caliber, and is anatomically normal.  1st Diag lesion, 50% stenosed.  LIMA was injected is normal in caliber, and is anatomically normal. 1. Severe 3 vessel occlusive CAD 2.  Patent LIMA to the LAD 3. Patent SVG to the first diagonal. 4. Patent SVG to the RCA 5. Severely diseased SVG to the OM.  Angiographic findings are unchanged from cardiac cath in 2013. The severely diseased OM graft was the same then. It is not suitable for PCI due to extensive and complex disease. Recommend continued medical therapy.   EKG: 06/21/2015 S brady, L axis Vent. rate 50 BPM PR interval 202 ms QRS duration 112 ms QT/QTc 510/464 ms   FOLLOW UP PLANS AND APPOINTMENTS Allergies  Allergen Reactions  . Bee Venom Anaphylaxis    Only yellow jackets   . Promethazine Hcl Other (See Comments)    syncope  . Doxycycline Hyclate Other (See Comments)    esophagitis  . Lipitor [Atorvastatin Calcium] Other (See Comments)    Myalgia/myopathy  . Other Diarrhea and Nausea And Vomiting    Seafood  . Tetracycline Other (  See Comments)    Esophagitis  . Crestor [Rosuvastatin] Other (See Comments)    Myalgia/myopathy  . Tetanus Toxoid Rash     Medication List    TAKE these medications        albuterol 108 (90 Base) MCG/ACT inhaler  Commonly known as:  PROVENTIL HFA;VENTOLIN HFA  Inhale 2 puffs into the lungs every 6 (six) hours as needed for wheezing or shortness of breath.     allopurinol 300 MG tablet  Commonly known as:  ZYLOPRIM  take 1 tablet by mouth once daily     amiodarone 200 MG tablet  Commonly known as:  PACERONE  Take 0.5 tablets (100 mg total) by mouth daily.     amLODipine 5 MG tablet  Commonly known as:  NORVASC  Take 1 tablet (5 mg total) by mouth daily.     aspirin EC 81 MG tablet  Take 1 tablet (81 mg total) by mouth daily.     b complex vitamins tablet  Take 1 tablet by mouth daily.     EPINEPHrine 0.3 mg/0.3 mL Soaj injection  Commonly known as:  EPIPEN  Inject 0.3 mLs (0.3 mg total) into the muscle once.     hydrocortisone cream 0.5 %  Apply 1 application topically daily.     isosorbide mononitrate 60 MG 24 hr tablet  Commonly known as:   IMDUR  Take 1 tablet (60 mg total) by mouth daily.     levothyroxine 125 MCG tablet  Commonly known as:  SYNTHROID, LEVOTHROID  take 1 tablet by mouth once daily     MAGNESIUM PO  Take 1 tablet by mouth daily.     multivitamin tablet  Take 1 tablet by mouth at bedtime.     nitroGLYCERIN 0.4 MG SL tablet  Commonly known as:  NITROSTAT  Place 1 tablet (0.4 mg total) under the tongue every 5 (five) minutes as needed for chest pain.     pantoprazole 40 MG tablet  Commonly known as:  PROTONIX  take 1 tablet by mouth once daily     pravastatin 20 MG tablet  Commonly known as:  PRAVACHOL  Take 1 tablet (20 mg total) by mouth daily at 6 PM.  Notes to Patient:  This is a weaker statin but will still help. It is hoped your body will tolerate it. Start with 1 tablet 2 x week and increase to 1 tablet daily if you do OK with it. Let us know if you get side effects.     tiotropium 18 MCG inhalation capsule  Commonly known as:  SPIRIVA HANDIHALER  PLACE 1 CAPSULE (18 MCG TOTAL) INTO INHALER AND INHALE DAILY.     TUMS ULTRA 1000 400 MG chewable tablet  Generic drug:  calcium elemental as carbonate  Chew 1,000 mg by mouth daily as needed for heartburn.     vardenafil 20 MG tablet  Commonly known as:  LEVITRA  Take 1 tablet (20 mg total) by mouth as needed for erectile dysfunction.     Vitamin D 2000 units Caps  Take 2,000 Units by mouth daily.        Discharge Instructions    Diet - low sodium heart healthy    Complete by:  As directed      Increase activity slowly    Complete by:  As directed           Follow-up Information    Follow up with Cristopher Peru, MD.   Specialty:  Cardiology  Why:  The office will call.   Contact information:   [id number] [address] [phone]       BRING ALL MEDICATIONS WITH YOU TO FOLLOW UP APPOINTMENTS  Time spent with patient to include physician time: 78 min Signed: Rosaria Ferries, PA-C 06/21/2015,  10:20 AM Co-Sign MD  I have examined the patient and reviewed assessment and plan and discussed with patient.  Agree with above as stated.    NSTEMI: medically treated. Troponin had the typical rise and fall of an acute coronary syndrome. Thrombotic graft which was present in 2013, still noted on catheterization done yesterday. The etiology of his non-STEMI is unclear. There was no culprit vessel that appeared to be an easy target for revascularization. Will add amlodipine 5 mg daily to help with BP and to help relieve angina a well. He is intolerant of statins. Lipids are reasonably well controlled. HTN, hyperlipidemia. CKD.  Amiodarone for paroxysmal atrial fibrillation. He has been in sinus bradycardia here. Would not add additional beta blocker.  He is quite active on his own. I asked him to increase his activity slowly after this event.  Tanner Vigna S.

## 2015-06-21 NOTE — Progress Notes (Signed)
[facility] for Heparin  Indication: chest pain/ACS, elevated troponin   Allergies  Allergen Reactions  . Bee Venom Anaphylaxis    Only yellow jackets   . Promethazine Hcl Other (See Comments)    syncope  . Doxycycline Hyclate Other (See Comments)    esophagitis  . Lipitor [Atorvastatin Calcium] Other (See Comments)    Myalgia/myopathy  . Other Diarrhea and Nausea And Vomiting    Seafood  . Tetracycline Other (See Comments)    Esophagitis  . Crestor [Rosuvastatin] Other (See Comments)    Myalgia/myopathy  . Tetanus Toxoid Rash    Patient Measurements: Height: 5\' 9"  (175.3 cm) Weight: 175 lb 8 oz (79.606 kg) IBW/kg (Calculated) : 70.7  Vital Signs: Temp: 97.9 F (36.6 C) (02/03 0503) Temp Source: Oral (02/03 0503) BP: 112/62 mmHg (02/03 0503) Pulse Rate: 82 (02/03 0503)  Labs:  Recent Labs  06/19/15 1124 06/19/15 1915  06/20/15 0354  06/20/15 0747 06/20/15 1856 06/21/15 0004 06/21/15 0643  HGB 14.7 14.9  --  13.6  --   --   --   --  13.1  HCT 43.5 43.3  --  40.6  --   --   --   --  38.1*  PLT 182 196  --  190  --   --   --   --  155  LABPROT  --   --   --  14.8  --   --   --   --   --   INR  --   --   --  1.14  --   --   --   --   --   HEPARINUNFRC  --   --   --   --   --  0.52  --   --  0.26*  CREATININE 1.84* 1.80*  --  1.81*  --   --   --   --   --   TROPONINI  --  0.38*  < >  --   < >  --  2.59* 2.49* 1.79*  < > = values in this interval not displayed.  Estimated Creatinine Clearance: 33.6 mL/min (by C-G formula based on Cr of 1.81).   Medical History: Past Medical History  Diagnosis Date  . Hypothyroidism 12/05/2008  . HYPERLIPIDEMIA 09/19/2008  . GOUT 09/19/2008  . ANEMIA-IRON DEFICIENCY 09/19/2008  . HYPERTENSION 09/19/2008  . CAD 12/05/2008    s/p CABG; NSTEMI 05/2011 - [facility] 05/25/11: LAD occluded, LIMA-LAD patent, ostial circumflex 50%, AV circumflex 70%, SVG-OM2 with extensive disease with thrombus (culprit vessel),  RCA 80% and occluded, SVG-intermediate patent, SVG-PDA/PL A patent.  Given that the culprit vessel was a subtotally occluded heavily thrombotic graft to a smaller OM2, medical therapy was recommended.;   . Atrial fibrillation ([place]) 04/19/2008    amiodarone rx;  Echocardiogram 05/26/11: No wall motion abnormalities, mild LVH, EF 60%.  . Atrial flutter ([place]) 12/05/2008    s/p RFCA  . BRADYCARDIA 12/05/2008  . AAA 09/19/2008  . PERIPHERAL VASCULAR DISEASE 09/19/2008  . GERD 09/19/2008  . DIVERTICULOSIS, COLON 09/19/2008  . RENAL INSUFFICIENCY 09/19/2008  . LOW BACK PAIN 09/19/2008  . PSA, INCREASED 09/19/2008  . COLONIC POLYPS, HX OF 09/19/2008    adenomatous polyps 07/2010  . GASTROINTESTINAL HEMORRHAGE, HX OF 04/19/2008  . NEPHROLITHIASIS, HX OF 01/11/2009  . CORONARY ARTERY BYPASS GRAFT, HX OF 12/05/2008    A. LIMA-LAD, VG-RI, VG-OM2, VG-RPD/RPL;  B. 05/2011 - NSTEMI - CATH  WITH 4/5 PATENT GRAFTS AND NEW THROMBUS IN DISTAL VG-OM2 - MED RX  . ANXIETY 06/13/2010  . Arthritis   . COPD (chronic obstructive pulmonary disease) ([place])   . Myocardial infarction ([place])   . Allergy   . Benign esophageal stricture 09/2075    dilated during EGD  . Erectile dysfunction 09/13/2012  . CKD (chronic kidney disease) 09/19/2008    Qualifier: Diagnosis of  By: Jenny Reichmann MD, Hunt Oris     Assessment: Heparin for elevated trop, heparin re-started s/p cath>>medical management recommended, heparin level after re-start is low at 0.26  Goal of Therapy:  Heparin level 0.3-0.7 units/ml Monitor platelets by anticoagulation protocol: Yes   Plan:  -Inc heparin drip to 1150 units/hr -1500 HL -Daily CBC/HL -Monitor for bleeding  Narda Bonds 06/21/2015,7:56 AM

## 2015-06-24 NOTE — Telephone Encounter (Signed)
Patient contacted regarding discharge from [facility] on 06/21/2015.  Patient understands to follow up with provider Lyda Jester on 07/05/2015 at 2:00 PM at [facility] office. Patient understands discharge instructions? yes Patient understands medications and regiment? yes Patient understands to bring all medications to this visit? yes   The pt prefers to see Dr Lovena Le for his post hospital follow-up.  I reviewed the MD schedule and Dr Lovena Le is in the office but did not have any open ROV slots available.  I made the pt aware that Brittainy will consult Dr Lovena Le if needed during the 07/05/15 appointment.

## 2015-06-26 ENCOUNTER — Encounter: Payer: Self-pay | Admitting: Cardiology

## 2015-06-27 ENCOUNTER — Ambulatory Visit (INDEPENDENT_AMBULATORY_CARE_PROVIDER_SITE_OTHER): Payer: Medicare Other | Admitting: Family Medicine

## 2015-06-27 ENCOUNTER — Encounter: Payer: Self-pay | Admitting: Family Medicine

## 2015-06-27 VITALS — BP 118/64 | HR 67 | Temp 98.5°F | Ht 69.0 in | Wt 177.7 lb

## 2015-06-27 DIAGNOSIS — N184 Chronic kidney disease, stage 4 (severe): Secondary | ICD-10-CM | POA: Diagnosis not present

## 2015-06-27 DIAGNOSIS — J069 Acute upper respiratory infection, unspecified: Secondary | ICD-10-CM

## 2015-06-27 DIAGNOSIS — J438 Other emphysema: Secondary | ICD-10-CM

## 2015-06-27 LAB — POCT RAPID STREP A (OFFICE): Rapid Strep A Screen: NEGATIVE

## 2015-06-27 LAB — POCT INFLUENZA A/B
INFLUENZA B, POC: POSITIVE — AB
Influenza A, POC: POSITIVE — AB

## 2015-06-27 MED ORDER — BENZONATATE 100 MG PO CAPS: 100.0000 mg | ORAL_CAPSULE | Freq: Two times a day (BID) | ORAL | Status: DC | PRN

## 2015-06-27 MED ORDER — AMOXICILLIN-POT CLAVULANATE 875-125 MG PO TABS: 1.0000 | ORAL_TABLET | Freq: Two times a day (BID) | ORAL | Status: DC

## 2015-06-27 NOTE — Progress Notes (Signed)
Pre visit review using our clinic review tool, if applicable. No additional management support is needed unless otherwise documented below in the visit note. 

## 2015-06-27 NOTE — Progress Notes (Signed)
HPI:  Acute visit for:  Sore throat -started: 4-5 days ago -symptoms: lots of thick white nasal congestion, sore throat, productive cough, sinus pressure and pain which he feels is worsening, diarrhea 2 days ago, fevers - up to 100.2  a few nights ago, now low grade, body aches -denies: SOB, CP, NV, tooth pain -has tried: nothing -sick contacts/travel/risks: denies flu exposure, tick exposure or or Ebola risks -Hx of: CAD and reports in hospital (released 1 week ago) for unstable angina - per dicharge note cath showed severe disease but "medical therapy is the only option".  He improved with medications and reports no CP since. Wife reports patient in hospital next to him had flu and they want to check on this. He is concerned he has strep throat.  ROS: See pertinent positives and negatives per HPI.  Past Medical History  Diagnosis Date  . Hypothyroidism 12/05/2008  . HYPERLIPIDEMIA 09/19/2008  . GOUT 09/19/2008  . ANEMIA-IRON DEFICIENCY 09/19/2008  . HYPERTENSION 09/19/2008  . CAD 12/05/2008    s/p CABG; NSTEMI 05/2011 - LHC 05/25/11: LAD occluded, LIMA-LAD patent, ostial circumflex 50%, AV circumflex 70%, SVG-OM2 with extensive disease with thrombus (culprit vessel), RCA 80% and occluded, SVG-intermediate patent, SVG-PDA/PL A patent.  Given that the culprit vessel was a subtotally occluded heavily thrombotic graft to a smaller OM2, medical therapy was recommended.;   . Atrial fibrillation ([place]) 04/19/2008    amiodarone rx;  Echocardiogram 05/26/11: No wall motion abnormalities, mild LVH, EF 60%.  . Atrial flutter ([place]) 12/05/2008    s/p RFCA  . BRADYCARDIA 12/05/2008  . AAA 09/19/2008  . PERIPHERAL VASCULAR DISEASE 09/19/2008  . GERD 09/19/2008  . DIVERTICULOSIS, COLON 09/19/2008  . RENAL INSUFFICIENCY 09/19/2008  . LOW BACK PAIN 09/19/2008  . PSA, INCREASED 09/19/2008  . COLONIC POLYPS, HX OF 09/19/2008    adenomatous polyps 07/2010  . GASTROINTESTINAL HEMORRHAGE, HX OF 04/19/2008  . NEPHROLITHIASIS, HX OF  01/11/2009  . CORONARY ARTERY BYPASS GRAFT, HX OF 12/05/2008    A. LIMA-LAD, VG-RI, VG-OM2, VG-RPD/RPL;  B. 05/2011 - NSTEMI - CATH WITH 4/5 PATENT GRAFTS AND NEW THROMBUS IN DISTAL VG-OM2 - MED RX  . ANXIETY 06/13/2010  . Arthritis   . COPD (chronic obstructive pulmonary disease) ([place])   . Myocardial infarction ([place])   . Allergy   . Benign esophageal stricture 09/2075    dilated during EGD  . Erectile dysfunction 09/13/2012  . CKD (chronic kidney disease) 09/19/2008    Qualifier: Diagnosis of  By: Jenny Reichmann MD, Hunt Oris   . Unstable angina pectoris [facility]) 05/2015    Cath 02/02 w/ patent LIMA-LAD, SVG-D1, SVG-RCA, severe dz in SVG-OM, med rx    Past Surgical History  Procedure Laterality Date  . Coronary artery bypass graft  2004 X 5  . S/p afib ablation  2004  . Cardiac catheterization  05/25/2011  . Colon surgery    . Esophagogastroduodenoscopy  04/28/2012    Procedure: ESOPHAGOGASTRODUODENOSCOPY (EGD);  Surgeon: [name], MD;  Location: [place];  Service: Endoscopy;  Laterality: N/A;  . Enteroscopy N/A 02/13/2014    Procedure: ENTEROSCOPY;  Surgeon: [name], MD;  Location: [place];  Service: Gastroenterology;  Laterality: N/A;  super slim  . Left heart catheterization with coronary/graft angiogram  05/25/2011    Procedure: LEFT HEART CATHETERIZATION WITH [name];  Surgeon: [name], MD;  Location: [facility] CATH LAB;  Service: Cardiovascular;;  . Cardiac catheterization N/A 06/20/2015    Procedure: Coronary/Graft Angiography;  Surgeon: Peter M Martinique, MD; LAD, CFX & RCA 100%; LIMA-LAD, SVG-D1 & SVG-RCA OK; SVG-OM severe dz, med rx     Family History  Problem Relation Age of Onset  . Diabetes Father   . Diabetes Sister   . Multiple myeloma Mother   . Cancer Mother   . Other Mother     varicose veins  . Heart disease Paternal Uncle   . Heart disease Maternal Uncle   . Lymphoma Sister     half-sister    Social History   Social History  . Marital  Status: Married    Spouse Name: [name]  . Number of Children: N/A  . Years of Education: N/A   Occupational History  . self employeed - Contractor    Social History Main Topics  . Smoking status: Former Smoker -- 4.00 packs/day for 31 years    Types: Cigarettes    Quit date: 03/19/1983  . Smokeless tobacco: Never Used  . Alcohol Use: 6.0 oz/week    10 Shots of liquor per week  . Drug Use: No  . Sexual Activity: Yes   Other Topics Concern  . None   Social History Narrative   Married lives with his wife     Current outpatient prescriptions:  .  albuterol (PROVENTIL HFA;VENTOLIN HFA) 108 (90 BASE) MCG/ACT inhaler, Inhale 2 puffs into the lungs every 6 (six) hours as needed for wheezing or shortness of breath., Disp: , Rfl:  .  allopurinol (ZYLOPRIM) 300 MG tablet, take 1 tablet by mouth once daily, Disp: 90 tablet, Rfl: 0 .  amiodarone (PACERONE) 200 MG tablet, Take 0.5 tablets (100 mg total) by mouth daily., Disp: 45 tablet, Rfl: 3 .  aspirin EC 81 MG tablet, Take 1 tablet (81 mg total) by mouth daily. (Patient taking differently: Take 162 mg by mouth daily. ), Disp: , Rfl:  .  b complex vitamins tablet, Take 1 tablet by mouth daily., Disp: , Rfl:  .  calcium elemental as carbonate (TUMS ULTRA 1000) 400 MG tablet, Chew 1,000 mg by mouth daily as needed for heartburn., Disp: , Rfl:  .  Cholecalciferol (VITAMIN D) 2000 UNITS CAPS, Take 2,000 Units by mouth daily. , Disp: , Rfl:  .  EPINEPHrine (EPIPEN) 0.3 mg/0.3 mL SOAJ injection, Inject 0.3 mLs (0.3 mg total) into the muscle once., Disp: 2 Device, Rfl: 2 .  isosorbide mononitrate (IMDUR) 60 MG 24 hr tablet, Take 1 tablet (60 mg total) by mouth daily., Disp: 30 tablet, Rfl: 11 .  levothyroxine (SYNTHROID, LEVOTHROID) 125 MCG tablet, take 1 tablet by mouth once daily, Disp: 90 tablet, Rfl: 0 .  MAGNESIUM PO, Take 1 tablet by mouth daily. , Disp: , Rfl:  .  Multiple Vitamin (MULTIVITAMIN) tablet, Take 1 tablet by mouth at bedtime.  , Disp: , Rfl:  .  nitroGLYCERIN (NITROSTAT) 0.4 MG SL tablet, Place 1 tablet (0.4 mg total) under the tongue every 5 (five) minutes as needed for chest pain., Disp: 25 tablet, Rfl: 5 .  pantoprazole (PROTONIX) 40 MG tablet, take 1 tablet by mouth once daily, Disp: 90 tablet, Rfl: 0 .  tiotropium (SPIRIVA HANDIHALER) 18 MCG inhalation capsule, PLACE 1 CAPSULE (18 MCG TOTAL) INTO INHALER AND INHALE DAILY., Disp: 90 capsule, Rfl: 3 .  vardenafil (LEVITRA) 20 MG tablet, Take 1 tablet (20 mg total) by mouth as needed for erectile dysfunction., Disp: 10 tablet, Rfl: 11 .  amLODipine (NORVASC) 5 MG tablet, Take 1 tablet (5 mg total) by mouth daily. (  Patient not taking: Reported on 06/27/2015), Disp: 30 tablet, Rfl: 11 .  amoxicillin-clavulanate (AUGMENTIN) 875-125 MG tablet, Take 1 tablet by mouth 2 (two) times daily., Disp: 14 tablet, Rfl: 0 .  benzonatate (TESSALON) 100 MG capsule, Take 1 capsule (100 mg total) by mouth 2 (two) times daily as needed for cough., Disp: 20 capsule, Rfl: 0 .  hydrocortisone cream 0.5 %, Apply 1 application topically daily. Reported on 06/27/2015, Disp: , Rfl:  .  pravastatin (PRAVACHOL) 20 MG tablet, Take 1 tablet (20 mg total) by mouth daily at 6 PM. (Patient not taking: Reported on 06/27/2015), Disp: 30 tablet, Rfl: 11  EXAM:  Filed Vitals:   06/27/15 1116  BP: 118/64  Pulse: 67  Temp: 98.5 F (36.9 C)    Body mass index is 26.23 kg/(m^2).  GENERAL: vitals reviewed and listed above, alert, oriented, appears well hydrated and in no acute distress  HEENT: atraumatic, conjunttiva clear, no obvious abnormalities on inspection of external nose and ears, normal appearance of ear canals and TMs, thick nasal congestion, mild post oropharyngeal erythema with PND, no tonsillar edema or exudate, no sinus TTP  NECK: no obvious masses on inspection  LUNGS: clear to auscultation bilaterally, no wheezes, rales or rhonchi, good air movement, O2 97% on room air  CV: HRRR, no  peripheral edema  MS: moves all extremities without noticeable abnormality  PSYCH: pleasant and cooperative, no obvious depression or anxiety  ASSESSMENT AND PLAN:  Discussed the following assessment and plan:  Acute upper respiratory infection - Plan: POC Influenza A/B, POCT rapid strep A  Other emphysema (HCC)  Chronic kidney disease (CKD), stage IV (severe) (HCC)  -Rapid flu positive - will cover with Levaquin for any 2ndary sinusitis given thick white mucus and sinus discomfort. Opted for Augmentin given his allergies, medications and comorbidities. Discussed tamiflu and he opted not to take - likely would not be very helpful given delay. We discussed treatment side effects, likely course, transmission, and signs of developing a serious illness. Advised to seek care care immediately if symptoms worsen or persist or new concerns arise.    Patient Instructions  Influenza, Adult Influenza ("the flu") is a viral infection of the respiratory tract. It occurs more often in winter months because people spend more time in close contact with one another. Influenza can make you feel very sick. Influenza easily spreads from person to person (contagious). CAUSES  Influenza is caused by a virus that infects the respiratory tract. You can catch the virus by breathing in droplets from an infected person's cough or sneeze. You can also catch the virus by touching something that was recently contaminated with the virus and then touching your mouth, nose, or eyes. RISKS AND COMPLICATIONS You may be at risk for a more severe case of influenza if you smoke cigarettes, have diabetes, have chronic heart disease (such as heart failure) or lung disease (such as asthma), or if you have a weakened immune system. Elderly people and pregnant women are also at risk for more serious infections. The most common problem of influenza is a lung infection (pneumonia). Sometimes, this problem can require emergency  medical care and may be life threatening. SIGNS AND SYMPTOMS  Symptoms typically last 4 to 10 days and may include:  Fever.  Chills.  Headache, body aches, and muscle aches.  Sore throat.  Chest discomfort and cough.  Poor appetite.  Weakness or feeling tired.  Dizziness.  Nausea or vomiting. DIAGNOSIS  Diagnosis of influenza is often made  based on your history and a physical exam. A nose or throat swab test can be done to confirm the diagnosis. TREATMENT  In mild cases, influenza goes away on its own. Treatment is directed at relieving symptoms. For more severe cases, your health care provider may prescribe antiviral medicines to shorten the sickness. Antibiotic medicines are not effective because the infection is caused by a virus, not by bacteria. HOME CARE INSTRUCTIONS  Take medicines only as directed by your health care provider.  Use a cool mist humidifier to make breathing easier.  Get plenty of rest until your temperature returns to normal. This usually takes 3 to 4 days.  Drink enough fluid to keep your urine clear or pale yellow.  Cover yourmouth and nosewhen coughing or sneezing,and wash your handswellto prevent thevirusfrom spreading.  Stay homefromwork orschool untilthe fever is gonefor at least 62fll day. PREVENTION  An annual influenza vaccination (flu shot) is the best way to avoid getting influenza. An annual flu shot is now routinely recommended for all adults in the [place]:  You experiencechest pain, yourcough worsens,or you producemore mucus.  Youhave nausea,vomiting, ordiarrhea.  Your fever returns or gets worse. SEEK IMMEDIATE MEDICAL CARE IF:  You havetrouble breathing, you become short of breath,or your skin ornails becomebluish.  You have severe painor stiffnessin the neck.  You develop a sudden headache, or pain in the face or ear.  You have nausea or vomiting that you cannot control. MAKE  SURE YOU:   Understand these instructions.  Will watch your condition.  Will get help right away if you are not doing well or get worse.   This information is not intended to replace advice given to you by your health care provider. Make sure you discuss any questions you have with your health care provider.   Document Released: 05/01/2000 Document Revised: 05/25/2014 Document Reviewed: 08/03/2011 Elsevier Interactive Patient Education 2016 [place]

## 2015-06-27 NOTE — Patient Instructions (Signed)

## 2015-06-29 ENCOUNTER — Encounter: Payer: Self-pay | Admitting: Internal Medicine

## 2015-06-29 ENCOUNTER — Ambulatory Visit (INDEPENDENT_AMBULATORY_CARE_PROVIDER_SITE_OTHER): Payer: Medicare Other | Admitting: Internal Medicine

## 2015-06-29 VITALS — BP 128/86 | HR 56 | Temp 98.1°F | Wt 177.0 lb

## 2015-06-29 DIAGNOSIS — F411 Generalized anxiety disorder: Secondary | ICD-10-CM

## 2015-06-29 DIAGNOSIS — I10 Essential (primary) hypertension: Secondary | ICD-10-CM

## 2015-06-29 DIAGNOSIS — R05 Cough: Secondary | ICD-10-CM | POA: Insufficient documentation

## 2015-06-29 DIAGNOSIS — R059 Cough, unspecified: Secondary | ICD-10-CM | POA: Insufficient documentation

## 2015-06-29 NOTE — Progress Notes (Signed)
Subjective:    Patient ID: Omar Benson, male    DOB: [birth year], 81 y.o.   MRN: [id number]  HPI  Here to f/u recent tx for cough/URI seen 2/9 with rx for augmentin and tessalon, no here after noted several episodes of early am prod cough with small blood, not clear if post nasal in origin vs hemoptysis, though has had no nose bleeds.  Only occurred this am, none yesterday, none after his episodes this am later in the day, not assoc with worsening sinus pain, pressure, congestion, ear pain, ST, worsening cough frequency, wheezing, sob or doe, or CP.  Pt denies orthopnea, PND, increased LE swelling, palpitations, dizziness or syncope. Pt denies new neurological symptoms such as new headache, or facial or extremity weakness or numbness   Pt denies polydipsia, polyuria.  On asa 81 mg daily, no anticoagulant.  Very worried he may be bleeding in the lungs or something bad getting worse. No prior hx of same.  Overall URI symptoms some improved Past Medical History  Diagnosis Date  . Hypothyroidism 12/05/2008  . HYPERLIPIDEMIA 09/19/2008  . GOUT 09/19/2008  . ANEMIA-IRON DEFICIENCY 09/19/2008  . HYPERTENSION 09/19/2008  . CAD 12/05/2008    s/p CABG; NSTEMI 05/2011 - LHC 05/25/11: LAD occluded, LIMA-LAD patent, ostial circumflex 50%, AV circumflex 70%, SVG-OM2 with extensive disease with thrombus (culprit vessel), RCA 80% and occluded, SVG-intermediate patent, SVG-PDA/PL A patent.  Given that the culprit vessel was a subtotally occluded heavily thrombotic graft to a smaller OM2, medical therapy was recommended.;   . Atrial fibrillation ([place]) 04/19/2008    amiodarone rx;  Echocardiogram 05/26/11: No wall motion abnormalities, mild LVH, EF 60%.  . Atrial flutter ([place]) 12/05/2008    s/p RFCA  . BRADYCARDIA 12/05/2008  . AAA 09/19/2008  . PERIPHERAL VASCULAR DISEASE 09/19/2008  . GERD 09/19/2008  . DIVERTICULOSIS, COLON 09/19/2008  . RENAL INSUFFICIENCY 09/19/2008  . LOW BACK PAIN 09/19/2008  . PSA, INCREASED 09/19/2008  . COLONIC  POLYPS, HX OF 09/19/2008    adenomatous polyps 07/2010  . GASTROINTESTINAL HEMORRHAGE, HX OF 04/19/2008  . NEPHROLITHIASIS, HX OF 01/11/2009  . CORONARY ARTERY BYPASS GRAFT, HX OF 12/05/2008    A. LIMA-LAD, VG-RI, VG-OM2, VG-RPD/RPL;  B. 05/2011 - NSTEMI - CATH WITH 4/5 PATENT GRAFTS AND NEW THROMBUS IN DISTAL VG-OM2 - MED RX  . ANXIETY 06/13/2010  . Arthritis   . COPD (chronic obstructive pulmonary disease) ([place])   . Myocardial infarction ([place])   . Allergy   . Benign esophageal stricture 09/2075    dilated during EGD  . Erectile dysfunction 09/13/2012  . CKD (chronic kidney disease) 09/19/2008    Qualifier: Diagnosis of  By: Jenny Reichmann MD, Hunt Oris   . Unstable angina pectoris [facility]) 05/2015    Cath 02/02 w/ patent LIMA-LAD, SVG-D1, SVG-RCA, severe dz in SVG-OM, med rx   Past Surgical History  Procedure Laterality Date  . Coronary artery bypass graft  2004 X 5  . S/p afib ablation  2004  . Cardiac catheterization  05/25/2011  . Colon surgery    . Esophagogastroduodenoscopy  04/28/2012    Procedure: ESOPHAGOGASTRODUODENOSCOPY (EGD);  Surgeon: [name], MD;  Location: [place];  Service: Endoscopy;  Laterality: N/A;  . Enteroscopy N/A 02/13/2014    Procedure: ENTEROSCOPY;  Surgeon: [name], MD;  Location: [place];  Service: Gastroenterology;  Laterality: N/A;  super slim  . Left heart catheterization with coronary/graft angiogram  05/25/2011    Procedure: LEFT HEART CATHETERIZATION WITH CORONARY/GRAFT  ANGIOGRAM;  Surgeon: [name], MD;  Location: [facility] CATH LAB;  Service: Cardiovascular;;  . Cardiac catheterization N/A 06/20/2015    Procedure: Coronary/Graft Angiography;  Surgeon: Peter M Martinique, MD; LAD, CFX & RCA 100%; LIMA-LAD, SVG-D1 & SVG-RCA OK; SVG-OM severe dz, med rx     reports that he quit smoking about 32 years ago. His smoking use included Cigarettes. He has a 124 pack-year smoking history. He has never used smokeless tobacco. He reports that he drinks about 6.0 oz of  alcohol per week. He reports that he does not use illicit drugs. family history includes Cancer in his mother; Diabetes in his father and sister; Heart disease in his maternal uncle and paternal uncle; Lymphoma in his sister; Multiple myeloma in his mother; Other in his mother. Allergies  Allergen Reactions  . Bee Venom Anaphylaxis    Only yellow jackets   . Promethazine Hcl Other (See Comments)    syncope  . Doxycycline Hyclate Other (See Comments)    esophagitis  . Lipitor [Atorvastatin Calcium] Other (See Comments)    Myalgia/myopathy  . Other Diarrhea and Nausea And Vomiting    Seafood  . Tetracycline Other (See Comments)    Esophagitis  . Crestor [Rosuvastatin] Other (See Comments)    Myalgia/myopathy  . Tetanus Toxoid Rash   Current Outpatient Prescriptions on File Prior to Visit  Medication Sig Dispense Refill  . albuterol (PROVENTIL HFA;VENTOLIN HFA) 108 (90 BASE) MCG/ACT inhaler Inhale 2 puffs into the lungs every 6 (six) hours as needed for wheezing or shortness of breath.    . allopurinol (ZYLOPRIM) 300 MG tablet take 1 tablet by mouth once daily 90 tablet 0  . amiodarone (PACERONE) 200 MG tablet Take 0.5 tablets (100 mg total) by mouth daily. 45 tablet 3  . amLODipine (NORVASC) 5 MG tablet Take 1 tablet (5 mg total) by mouth daily. 30 tablet 11  . amoxicillin-clavulanate (AUGMENTIN) 875-125 MG tablet Take 1 tablet by mouth 2 (two) times daily. 14 tablet 0  . aspirin EC 81 MG tablet Take 1 tablet (81 mg total) by mouth daily. (Patient taking differently: Take 162 mg by mouth daily. )    . b complex vitamins tablet Take 1 tablet by mouth daily.    . calcium elemental as carbonate (TUMS ULTRA 1000) 400 MG tablet Chew 1,000 mg by mouth daily as needed for heartburn.    . Cholecalciferol (VITAMIN D) 2000 UNITS CAPS Take 2,000 Units by mouth daily.     Marland Kitchen EPINEPHrine (EPIPEN) 0.3 mg/0.3 mL SOAJ injection Inject 0.3 mLs (0.3 mg total) into the muscle once. 2 Device 2  .  hydrocortisone cream 0.5 % Apply 1 application topically daily. Reported on 06/27/2015    . isosorbide mononitrate (IMDUR) 60 MG 24 hr tablet Take 1 tablet (60 mg total) by mouth daily. 30 tablet 11  . levothyroxine (SYNTHROID, LEVOTHROID) 125 MCG tablet take 1 tablet by mouth once daily 90 tablet 0  . MAGNESIUM PO Take 1 tablet by mouth daily.     . Multiple Vitamin (MULTIVITAMIN) tablet Take 1 tablet by mouth at bedtime.     . nitroGLYCERIN (NITROSTAT) 0.4 MG SL tablet Place 1 tablet (0.4 mg total) under the tongue every 5 (five) minutes as needed for chest pain. 25 tablet 5  . pantoprazole (PROTONIX) 40 MG tablet take 1 tablet by mouth once daily 90 tablet 0  . pravastatin (PRAVACHOL) 20 MG tablet Take 1 tablet (20 mg total) by mouth daily at 6 PM.  30 tablet 11  . tiotropium (SPIRIVA HANDIHALER) 18 MCG inhalation capsule PLACE 1 CAPSULE (18 MCG TOTAL) INTO INHALER AND INHALE DAILY. 90 capsule 3  . vardenafil (LEVITRA) 20 MG tablet Take 1 tablet (20 mg total) by mouth as needed for erectile dysfunction. 10 tablet 11  . benzonatate (TESSALON) 100 MG capsule Take 1 capsule (100 mg total) by mouth 2 (two) times daily as needed for cough. (Patient not taking: Reported on 06/29/2015) 20 capsule 0   No current facility-administered medications on file prior to visit.   Review of Systems  Constitutional: Negative for unusual diaphoresis or night sweats HENT: Negative for ringing in ear or discharge Eyes: Negative for double vision or worsening visual disturbance.  Respiratory: Negative for choking and stridor.   Gastrointestinal: Negative for vomiting or other signifcant bowel change Genitourinary: Negative for hematuria or change in urine volume.  Musculoskeletal: Negative for other MSK pain or swelling Skin: Negative for color change and worsening wound.  Neurological: Negative for tremors and numbness other than noted  Psychiatric/Behavioral: Negative for decreased concentration or agitation other  than above       Objective:   Physical Exam BP 128/86 mmHg  Pulse 56  Temp(Src) 98.1 F (36.7 C) (Oral)  Wt 177 lb (80.287 kg)  SpO2 96% VS noted, non toxic Constitutional: Pt appears in no significant distress HENT: Head: NCAT.  Right Ear: External ear normal.  Left Ear: External ear normal.  Bilat tm's with mild erythema.  Max sinus areas non tender.  Pharynx with mild erythema, no exudate No epistaxis Eyes: . Pupils are equal, round, and reactive to light. Conjunctivae and EOM are normal Neck: Normal range of motion. Neck supple.  Cardiovascular: Normal rate and regular rhythm.   Pulmonary/Chest: Effort normal and breath sounds decreased without rales or wheezing.  Neurological: Pt is alert. Not confused , motor grossly intact Skin: Skin is warm. No rash, no LE edema Psychiatric: Pt behavior is normal. No agitation. 1-2+ nervous    Assessment & Plan:

## 2015-06-29 NOTE — Progress Notes (Signed)
Pre visit review using our clinic review tool, if applicable. No additional management support is needed unless otherwise documented below in the visit note. 

## 2015-06-29 NOTE — Patient Instructions (Signed)
OK to take the OTC Zyrtec and mucinex as needed, as discussed today  Please continue all other medications as before, and refills have been done if requested.  Please have the pharmacy call with any other refills you may need.  Please keep your appointments with your specialists as you may have planned  If not better, please have the Chest Xray done on Monday Feb 13 (no appointment, just go to basement xray dept)

## 2015-06-30 NOTE — Assessment & Plan Note (Signed)
Pt quite unnerved by the blood in sputum this am, but I have attempted to reassure, ok to cont same tx, and encouraged the cxr as way of ruling out obvious pna, abscess or overt malignancy

## 2015-06-30 NOTE — Assessment & Plan Note (Addendum)
Etiology unclear, most likely URI improving, now with probable bronchitic symptoms assoc with cough and small blood with morning sputum production, possibly now improved later in the day, not assoc with worsening fever, pain, sob, wheezing;  Not on anticoagulant, overall fairly typical for infectious related lower resp tract symptoms, but cant completely r/o other, for cxr, cont augmentin/cough control,  to f/u any worsening symptoms or concerns; would certainly consider pulm referral if persists and/or CT chest

## 2015-06-30 NOTE — Assessment & Plan Note (Signed)
stable overall by history and exam, recent data reviewed with pt, and pt to continue medical treatment as before,  to f/u any worsening symptoms or concerns BP Readings from Last 3 Encounters:  06/29/15 128/86  06/27/15 118/64  06/21/15 112/62

## 2015-07-05 ENCOUNTER — Ambulatory Visit (INDEPENDENT_AMBULATORY_CARE_PROVIDER_SITE_OTHER): Payer: Medicare Other | Admitting: Cardiology

## 2015-07-05 ENCOUNTER — Encounter: Payer: Self-pay | Admitting: Cardiology

## 2015-07-05 VITALS — BP 130/80 | HR 60 | Ht 69.0 in | Wt 181.0 lb

## 2015-07-05 DIAGNOSIS — I25810 Atherosclerosis of coronary artery bypass graft(s) without angina pectoris: Secondary | ICD-10-CM | POA: Diagnosis not present

## 2015-07-05 NOTE — Progress Notes (Signed)
07/05/2015 Omar Benson   1935-01-24  [id number]  Primary Physician Omar Cower, MD Primary Cardiologist: Dr. Lovena Benson   Reason for Visit/CC: Omar Benson F/u for CAD   HPI:  Omar Benson is a 81 y.o. male with a history of CABG in 2004, HTN, HLD and PAF, on amdiodaone. Cath in 2013 with patent LIMA to LAD, VG-RI, VG to PDA and PLA, subtotally occluded heavily thrombotic graft to smaller OM2, native second ramus with 50% narrowing. EF 60% no wall motion abnormality 2013. Hx aflutter w/ ablation, afib, AVM, sm AAA, COPD.   Omar Benson came to the emergency room on 06/19/2015 with upper back pain, pain under the tongue and into his jaw. He was weak. His ECG showed a new right bundle branch block with lateral ST depression. He was treated with IV nitroglycerin and morphine. His labs were stable. CT of the chest showed no dissection. He was admitted for further evaluation and treatment.  His troponins were elevated consistent with a non-STEMI. He was taken to the cath lab on 06/20/2015. He had severe disease in the SVG-OM 2, but medical therapy is the only option. He tolerated the procedure well.  His blood pressure was elevated. He was started on amlodipine 5 mg and BP improved. Imdur 60 mg was added for anti-anginal effects. He had not been on a statin due to S.E. From Lipitor and Crestor. He was started on Pravachol.   He presents to clinic today for post Benson f/u. He reports that he has done well. He denies any recurrent CP. No dyspnea. No complications following his cath. He has not been fully compliant with all of his meds. He stopped amlodipine and Imdur due to low BP. He also has not taken Pravastatin due to side effects. He denies any CP, neck or jaw pain. He has not had to use SL NTG. He notes regular exercise. He walks on the treadmill for 30 min a day.     Current Outpatient Prescriptions  Medication Sig Dispense Refill  . albuterol (PROVENTIL HFA;VENTOLIN HFA) 108 (90 BASE) MCG/ACT  inhaler Inhale 2 puffs into the lungs every 6 (six) hours as needed for wheezing or shortness of breath.    . allopurinol (ZYLOPRIM) 300 MG tablet take 1 tablet by mouth once daily 90 tablet 0  . amiodarone (PACERONE) 200 MG tablet Take 0.5 tablets (100 mg total) by mouth daily. 45 tablet 3  . aspirin 81 MG tablet Take 162 mg by mouth daily.    Marland Kitchen b complex vitamins tablet Take 1 tablet by mouth daily.    . calcium elemental as carbonate (TUMS ULTRA 1000) 400 MG tablet Chew 1,000 mg by mouth daily as needed for heartburn.    . Cholecalciferol (VITAMIN D) 2000 UNITS CAPS Take 2,000 Units by mouth daily.     Marland Kitchen EPINEPHrine (EPIPEN) 0.3 mg/0.3 mL SOAJ injection Inject 0.3 mLs (0.3 mg total) into the muscle once. 2 Device 2  . hydrocortisone cream 0.5 % Apply 1 application topically daily. Reported on 06/27/2015    . levothyroxine (SYNTHROID, LEVOTHROID) 125 MCG tablet take 1 tablet by mouth once daily 90 tablet 0  . MAGNESIUM PO Take 1 tablet by mouth daily.     . Multiple Vitamin (MULTIVITAMIN) tablet Take 1 tablet by mouth at bedtime.     . nitroGLYCERIN (NITROSTAT) 0.4 MG SL tablet Place 1 tablet (0.4 mg total) under the tongue every 5 (five) minutes as needed for chest pain. 25 tablet 5  .  pantoprazole (PROTONIX) 40 MG tablet take 1 tablet by mouth once daily 90 tablet 0  . tiotropium (SPIRIVA HANDIHALER) 18 MCG inhalation capsule PLACE 1 CAPSULE (18 MCG TOTAL) INTO INHALER AND INHALE DAILY. 90 capsule 3  . vardenafil (LEVITRA) 20 MG tablet Take 1 tablet (20 mg total) by mouth as needed for erectile dysfunction. 10 tablet 11   No current facility-administered medications for this visit.    Allergies  Allergen Reactions  . Bee Venom Anaphylaxis    Only yellow jackets   . Promethazine Hcl Other (See Comments)    syncope  . Doxycycline Hyclate Other (See Comments)    esophagitis  . Lipitor [Atorvastatin Calcium] Other (See Comments)    Myalgia/myopathy  . Other Diarrhea and Nausea And  Vomiting    Seafood  . Tetracycline Other (See Comments)    Esophagitis  . Crestor [Rosuvastatin] Other (See Comments)    Myalgia/myopathy  . Tetanus Toxoid Rash    Social History   Social History  . Marital Status: Married    Spouse Name: Omar Benson  . Number of Children: N/A  . Years of Education: N/A   Occupational History  . self employeed - Contractor    Social History Main Topics  . Smoking status: Former Smoker -- 4.00 packs/day for 31 years    Types: Cigarettes    Quit date: 03/19/1983  . Smokeless tobacco: Never Used  . Alcohol Use: 6.0 oz/week    10 Shots of liquor per week  . Drug Use: No  . Sexual Activity: Yes   Other Topics Concern  . Not on file   Social History Narrative   Married lives with his wife     Review of Systems: General: negative for chills, fever, night sweats or weight changes.  Cardiovascular: negative for chest pain, dyspnea on exertion, edema, orthopnea, palpitations, paroxysmal nocturnal dyspnea or shortness of breath Dermatological: negative for rash Respiratory: negative for cough or wheezing Urologic: negative for hematuria Abdominal: negative for nausea, vomiting, diarrhea, bright red blood per rectum, melena, or hematemesis Neurologic: negative for visual changes, syncope, or dizziness All other systems reviewed and are otherwise negative except as noted above.    Blood pressure 130/80, pulse 60, height 5\' 9"  (1.753 m), weight 181 lb (82.101 kg).  General appearance: alert, cooperative and no distress Neck: no carotid bruit and no JVD Lungs: clear to auscultation bilaterally Heart: regular rate and rhythm, S1, S2 normal, no murmur, click, rub or gallop Extremities: no LEE Pulses: 2+ and symmetric Skin: warm and dry Neurologic: Grossly normal  EKG not performed.   ASSESSMENT AND PLAN:   1.CAD: s/p CABG in 2004. Recent admission for NSTEMI. LHC showed severe disease in the SVG-OM, not amendable to PCI.  Medical  management with ASA, nitrate, CCB and statin recommended but patient non compliant with amlodipine and Imdur because of concerns for hypotension. I recommended that he try at least low dose Imdur. He also refuses to take Pravastatin due to side effects. He has been intolerant to Lipitor and Crestor.  Unable to tolerate BBs due to issues with bradycardia. Luckily, he has remained pain free since discharge. Patient instructed to notify our office if any recurrent pain, if so can try restarting Imdur at a low dose.   2. PAF: on amiodarone. HR is well controlled. Denies any breakthrough atrial fibrillation.   3. HTN: BP is well controlled at 130/80.   4. HLD: intolerant to statins. Patient encouraged to continue regular exercise and monitor diet. He  walks on treadmill 30 min/day w/o limitations.   PLAN  F/u with Dr. Lovena Benson in 4-6 weeks.   Lyda Jester PA-C 07/05/2015 2:13 PM

## 2015-07-05 NOTE — Patient Instructions (Addendum)
Medication Instructions:   Your physician recommends that you continue on your current medications as directed. Please refer to the Current Medication list given to you today.   If you need a refill on your cardiac medications before your next appointment, please call your pharmacy.  Labwork: NONE ORDER TODAY    Testing/Procedures:  NONE ORDER TODAY    Follow-Up: Dr Lovena Le   4 TO 6 WEEKS  OR  NEXT AVAILBLE    Any Other Special Instructions Will Be Listed Below (If Applicable).                                                                                                                                                 \

## 2015-07-18 ENCOUNTER — Encounter: Payer: Self-pay | Admitting: Internal Medicine

## 2015-08-04 ENCOUNTER — Other Ambulatory Visit: Payer: Self-pay | Admitting: Internal Medicine

## 2015-08-06 ENCOUNTER — Other Ambulatory Visit: Payer: Self-pay | Admitting: Internal Medicine

## 2015-08-09 ENCOUNTER — Ambulatory Visit (INDEPENDENT_AMBULATORY_CARE_PROVIDER_SITE_OTHER): Payer: Medicare Other | Admitting: Physician Assistant

## 2015-08-09 VITALS — BP 134/68 | HR 62 | Temp 98.1°F | Resp 18 | Ht 69.0 in | Wt 182.4 lb

## 2015-08-09 DIAGNOSIS — W57XXXA Bitten or stung by nonvenomous insect and other nonvenomous arthropods, initial encounter: Secondary | ICD-10-CM | POA: Diagnosis not present

## 2015-08-09 DIAGNOSIS — S70362A Insect bite (nonvenomous), left thigh, initial encounter: Secondary | ICD-10-CM

## 2015-08-09 DIAGNOSIS — I25810 Atherosclerosis of coronary artery bypass graft(s) without angina pectoris: Secondary | ICD-10-CM | POA: Diagnosis not present

## 2015-08-09 NOTE — Progress Notes (Signed)
Omar Benson  MRN: [id number] DOB: [birth year]  Subjective:   The patient is a 81 year old male who presents with a tick bite. [place]. 3 days ago he noticed irritation and itching on his upper medial left thigh and discovered a tick. He used tape and took the tick out, which he identified as a deer tick. He has hx of multiple deer tick bites over the past two years, however this one feels different. He states it is increasing in redness, size and pain. He has treated with area with neosporin and hydrogen peroxide with minimal relief. Denies recent fever, body aches, nausea, dizziness.  He feels like the tick was on less than 6 hours.     Patient Active Problem List   Diagnosis Date Noted  . Cough 06/29/2015  . Unstable angina ([place]) 06/19/2015  . Abdominal mass, RUQ (right upper quadrant) 04/22/2015  . Benign paroxysmal positional vertigo 02/25/2015  . Former smoker 06/30/2014  . Acute back pain 05/08/2014  . Angiodysplasia of small intestine, except duodenum with bleeding 02/13/2014  . Acute GI bleeding 02/12/2014  . Acute blood loss anemia 02/12/2014  . Skin lesion of face 12/31/2013  . Enlarged salivary gland 12/31/2013  . Right shoulder pain 06/27/2013  . Rash and nonspecific skin eruption 06/27/2013  . Erectile dysfunction 09/13/2012  . Acute sinus infection 08/22/2012  . Allergic rhinitis, cause unspecified 08/22/2012  . GI AVM (gastrointestinal arteriovenous vascular malformation) 04/28/2012  . Stricture and stenosis of esophagus 04/28/2012  . GI bleed 04/26/2012  . Non-STEMI (non-ST elevated myocardial infarction) ([place]) 05/24/2011  . COPD (chronic obstructive pulmonary disease) ([place]) 09/08/2010  . Hypothyroid 08/21/2010  . Preventative health care 08/21/2010  . GASTRITIS 07/21/2010  . Anxiety state 06/13/2010  . NEPHROLITHIASIS, HX OF 01/11/2009  . Coronary atherosclerosis, CABG 2004 12/05/2008  . ATRIAL FLUTTER, CHRONIC  12/05/2008  . Sinus node dysfunction ([place]) 12/05/2008  . Hyperlipidemia LDL goal <70 09/19/2008  . GOUT 09/19/2008  . Iron deficiency anemia 09/19/2008  . Essential hypertension 09/19/2008  . Aneurysm of abdominal vessel ([place]) 09/19/2008  . PERIPHERAL VASCULAR DISEASE 09/19/2008  . GERD 09/19/2008  . DIVERTICULOSIS, COLON 09/19/2008  . CKD (chronic kidney disease) 09/19/2008  . LOW BACK PAIN 09/19/2008  . PSA, INCREASED 09/19/2008  . COLONIC POLYPS, HX OF 09/19/2008  . Atrial fibrillation ([place]) 04/19/2008  . GASTROINTESTINAL HEMORRHAGE, HX OF 04/19/2008    Current Outpatient Prescriptions on File Prior to Visit  Medication Sig Dispense Refill  . allopurinol (ZYLOPRIM) 300 MG tablet take 1 tablet by mouth once daily 90 tablet 0  . amiodarone (PACERONE) 200 MG tablet TAKE 1/2 TABLET BY MOUTH DAILY 45 tablet 3  . aspirin 81 MG tablet Take 162 mg by mouth daily.    Marland Kitchen b complex vitamins tablet Take 1 tablet by mouth daily.    . calcium elemental as carbonate (TUMS ULTRA 1000) 400 MG tablet Chew 1,000 mg by mouth daily as needed for heartburn.    . Cholecalciferol (VITAMIN D) 2000 UNITS CAPS Take 2,000 Units by mouth daily.     Marland Kitchen EPINEPHrine (EPIPEN) 0.3 mg/0.3 mL SOAJ injection Inject 0.3 mLs (0.3 mg total) into the muscle once. 2 Device 2  . hydrocortisone cream 0.5 % Apply 1 application topically daily. Reported on 06/27/2015    . levothyroxine (SYNTHROID, LEVOTHROID) 125 MCG tablet take 1 tablet by mouth once daily 90 tablet 0  . MAGNESIUM PO Take 1  tablet by mouth daily.     . Multiple Vitamin (MULTIVITAMIN) tablet Take 1 tablet by mouth at bedtime.     . nitroGLYCERIN (NITROSTAT) 0.4 MG SL tablet Place 1 tablet (0.4 mg total) under the tongue every 5 (five) minutes as needed for chest pain. 25 tablet 5  . pantoprazole (PROTONIX) 40 MG tablet take 1 tablet by mouth once daily 90 tablet 0  . tiotropium (SPIRIVA HANDIHALER) 18 MCG inhalation capsule PLACE 1 CAPSULE (18 MCG TOTAL) INTO  INHALER AND INHALE DAILY. 90 capsule 3  . albuterol (PROVENTIL HFA;VENTOLIN HFA) 108 (90 BASE) MCG/ACT inhaler Inhale 2 puffs into the lungs every 6 (six) hours as needed for wheezing or shortness of breath. Reported on 08/09/2015    . vardenafil (LEVITRA) 20 MG tablet Take 1 tablet (20 mg total) by mouth as needed for erectile dysfunction. (Patient not taking: Reported on 08/09/2015) 10 tablet 11   No current facility-administered medications on file prior to visit.    Allergies  Allergen Reactions  . Bee Venom Anaphylaxis    Only yellow jackets   . Promethazine Hcl Other (See Comments)    syncope  . Doxycycline Hyclate Other (See Comments)    esophagitis  . Lipitor [Atorvastatin Calcium] Other (See Comments)    Myalgia/myopathy  . Other Diarrhea and Nausea And Vomiting    Seafood  . Tetracycline Other (See Comments)    Esophagitis  . Crestor [Rosuvastatin] Other (See Comments)    Myalgia/myopathy  . Tetanus Toxoid Rash    Review of Systems  Constitutional: Negative for fever and chills.  Skin: Positive for rash.   Objective:  BP 134/68 mmHg  Pulse 62  Temp(Src) 98.1 F (36.7 C)  Resp 18  Ht 5\' 9"  (1.753 m)  Wt 182 lb 6.4 oz (82.736 kg)  BMI 26.92 kg/m2  SpO2 95%  Physical Exam  Constitutional: He is oriented to person, place, and time and well-developed, well-nourished, and in no distress.  HENT:  Head: Normocephalic and atraumatic.  Right Ear: External ear normal.  Left Ear: External ear normal.  Eyes: Conjunctivae are normal.  Neck: Normal range of motion.  Pulmonary/Chest: Effort normal.  Neurological: He is alert and oriented to person, place, and time. Gait normal.  Skin: Skin is warm and dry.  Left posterior mid thigh - 2cm vasculitic rash that is not raised, non-blanching with central indurated area without fluctuance - no remaining tick parts visible  Psychiatric: Mood, memory, affect and judgment normal.    Assessment and Plan :  Tick bite of thigh,  left, initial encounter   D/w pt due to tick being attached less than 36h and greater than 72h since removal prophylaxis is not warranted.  He will monitor for symptoms and RTC in 10-14 days if he develops any symptoms of Lymes disease which we discussed with him.  His questions were answered and he agrees with the above plan.  Windell Hummingbird PA-C  [facility] 08/09/2015 7:59 PM

## 2015-08-09 NOTE — Patient Instructions (Addendum)
IF you received an x-ray today, you will receive an invoice from [facility]. Please contact [facility] at [phone] with questions or concerns regarding your invoice.   IF you received labwork today, you will receive an invoice from [employer]. Please contact [facility] at [phone] with questions or concerns regarding your invoice.   Our billing staff will not be able to assist you with questions regarding bills from these companies.  You will be contacted with the lab results as soon as they are available. The fastest way to get your results is to activate your My Chart account. Instructions are located on the last page of this paperwork. If you have not heard from [place] regarding the results in 2 weeks, please contact this office.     Deer ticLyme Disease Lyme disease is an infection that affects many parts of the body, including the skin, joints, and nervous system. CAUSES Lyme disease is caused by bacteria called Borrelia burgdorferi. You can get Lyme disease by being bitten by an infected tick. The tick must be attached to your skin for at least 36 hours to transmit the infection. Deer often carry infected ticks. RISK FACTORS  Living in or visiting [place], or the [place].  Spending time in wooded or grassy areas.  Being outdoors with exposed skin.  Failing to remove a tick from your skin within 3-4 days. SIGNS AND SYMPTOMS  A round, red rash that comes out from the center of the tick bite. This is the first sign of infection. The center of the rash may be blood colored or have tiny blisters.  Fatigue.  Headache.  Chills and fever.  General achiness.  Joint pain, often in the knee.  Swollen lymph glands. DIAGNOSIS Lyme disease is diagnosed with a medical history, physical exam, and blood test. TREATMENT The main treatment is antibiotic medicine, usually taken by mouth. The length  of treatment depends on how soon after a tick bite you begin taking the medicine. In some cases, treatment is necessary for several weeks. If the infection is severe, IV antibiotics may be necessary. HOME CARE INSTRUCTIONS  Take your antibiotic medicine as directed by your health care provider. Finish the antibiotic even if you start to feel better.  You may take a probiotic in between doses of your antibiotic to help avoid stomach upset or diarrhea.  Check with your health care provider before supplementing your treatment. Many alternative therapies have not been proven and may be harmful to you.  Keep all follow-up visits as directed by your health care provider. This is important. PREVENTION Reinfection is possible with another tick bite by an infected tick. Take these precautions to prevent an infection:  Cover your skin with light-colored clothing when outdoors in the spring and summer months.  Spray clothing and skin with bug spray. The spray should be 20-30% DEET.  Avoided wooded, grassy, and shaded areas.  Remove yard litter, brush, trash, and plants that attract deer and rodents.  Check yourself for ticks when you come indoors.  Wash clothing worn each day.  Check your pets for ticks before they come inside.  If you find a tick:  Remove it with tweezers.  Clean your hands and the bite area with rubbing alcohol or soap and water. Pregnant women should take special care to avoid tick bites because the infection can be passed along to the fetus. SEEK MEDICAL CARE IF:  You have symptoms after treatment.  You have removed a tick and want to bring it to your health care provider for testing. SEEK IMMEDIATE MEDICAL CARE IF:  You have an irregular heartbeat.  You have nerve pain.  Your face feels numb. MAKE SURE YOU:  Understand these instructions.  Will watch your condition.  Will get help right away if you are not doing well or get worse.   This information is  not intended to replace advice given to you by your health care provider. Make sure you discuss any questions you have with your health care provider.   Document Released: 08/10/2000 Document Revised: 05/25/2014 Document Reviewed: 09/19/2013 Elsevier Interactive Patient Education 2016 [place] Information Ticks are insects that attach themselves to the skin and draw blood for food. There are various types of ticks. Common types include wood ticks and deer ticks. Most ticks live in shrubs and grassy areas. Ticks can climb onto your body when you make contact with leaves or grass where the tick is waiting. The most common places on the body for ticks to attach themselves are the scalp, neck, armpits, waist, and groin. Most tick bites are harmless, but sometimes ticks carry germs that cause diseases. These germs can be spread to a person during the tick's feeding process. The chance of a disease spreading through a tick bite depends on:   The type of tick.  Time of year.   How long the tick is attached.   Geographic location.  HOW CAN YOU PREVENT TICK BITES? Take these steps to help prevent tick bites when you are outdoors:  Wear protective clothing. Long sleeves and long pants are best.   Wear white clothes so you can see ticks more easily.  Tuck your pant legs into your socks.   If walking on a trail, stay in the middle of the trail to avoid brushing against bushes.  Avoid walking through areas with long grass.  Put insect repellent on all exposed skin and along boot tops, pant legs, and sleeve cuffs.   Check clothing, hair, and skin repeatedly and before going inside.   Brush off any ticks that are not attached.  Take a shower or bath as soon as possible after being outdoors.  WHAT IS THE PROPER WAY TO REMOVE A TICK? Ticks should be removed as soon as possible to help prevent diseases caused by tick bites.  If latex gloves are available, put them on  before trying to remove a tick.   Using fine-point tweezers, grasp the tick as close to the skin as possible. You may also use curved forceps or a tick removal tool. Grasp the tick as close to its head as possible. Avoid grasping the tick on its body.  Pull gently with steady upward pressure until the tick lets go. Do not twist the tick or jerk it suddenly. This may break off the tick's head or mouth parts.  Do not squeeze or crush the tick's body. This could force disease-carrying fluids from the tick into your body.   After the tick is removed, wash the bite area and your hands with soap and water or other disinfectant such as alcohol.  Apply a small amount of antiseptic cream or ointment to the bite site.   Wash and disinfect any instruments that were used.  Do not try to remove a tick by applying a hot match, petroleum jelly, or fingernail polish to the tick. These methods do not work and may increase the chances of disease being  spread from the tick bite.  WHEN SHOULD YOU SEEK MEDICAL CARE? Contact your health care provider if you are unable to remove a tick from your skin or if a part of the tick breaks off and is stuck in the skin.  After a tick bite, you need to be aware of signs and symptoms that could be related to diseases spread by ticks. Contact your health care provider if you develop any of the following in the days or weeks after the tick bite:  Unexplained fever.  Rash. A circular rash that appears days or weeks after the tick bite may indicate the possibility of Lyme disease. The rash may resemble a target with a bull's-eye and may occur at a different part of your body than the tick bite.  Redness and swelling in the area of the tick bite.   Tender, swollen lymph glands.   Diarrhea.   Weight loss.   Cough.   Fatigue.   Muscle, joint, or bone pain.   Abdominal pain.   Headache.   Lethargy or a change in your level of consciousness.  Difficulty  walking or moving your legs.   Numbness in the legs.   Paralysis.  Shortness of breath.   Confusion.   Repeated vomiting.    This information is not intended to replace advice given to you by your health care provider. Make sure you discuss any questions you have with your health care provider.   Document Released: 05/01/2000 Document Revised: 05/25/2014 Document Reviewed: 10/12/2012 Elsevier Interactive Patient Education [employer].

## 2015-08-09 NOTE — Progress Notes (Signed)
Subjective:     Patient ID: Omar Benson, male   DOB: 10-08-1934, 81 y.o.   MRN: [id number]  HPI The patient is a 80 year old male who presents with a tick bite. [place]. 3 days ago he noticed irritation and itching on his upper medial left thigh and discovered a tick. He used tape and took the tick out, which he identified as a deer tick. He has hx of multiple deer tick bites over the past two years, however this one feels different. He states it is increasing in redness, size and pain. He has treated with area with neosporin and hydrogen peroxide with minimal relief. Denies recent fever, body aches, nausea, dizziness.  Review of Systems All pertinent ROS as above in HPI    Objective:   Physical Exam  Constitutional: He appears well-developed and well-nourished. No distress.  HENT:  Head: Normocephalic and atraumatic.  Cardiovascular: Intact distal pulses.   Skin: Skin is warm and dry.  1cm X 3 cm red/purple purpura area with petechiae on the medial aspect of left thigh, with 1cm indurated area in the center       Assessment:     The patient is a 81 year old male who presents with tick bite. He is outside of the doxycycline prophylaxis window and states the deer tick was not in his skin longer than 36 hours, therefore likely lyme infection is low. Will not treat at this time, educated patient on s&s of lymes and to come back to office for testing if needed.    Plan:     1. Tick bite of thigh, left, initial encounter - Cold compresses on area  - Continue to use neosporin and hydrogen peroxide for relief as needed

## 2015-08-13 ENCOUNTER — Encounter (INDEPENDENT_AMBULATORY_CARE_PROVIDER_SITE_OTHER): Payer: Self-pay

## 2015-08-16 ENCOUNTER — Ambulatory Visit (INDEPENDENT_AMBULATORY_CARE_PROVIDER_SITE_OTHER): Payer: Medicare Other | Admitting: Internal Medicine

## 2015-08-16 ENCOUNTER — Encounter: Payer: Self-pay | Admitting: Internal Medicine

## 2015-08-16 VITALS — BP 128/70 | HR 58 | Ht 69.0 in | Wt 180.8 lb

## 2015-08-16 DIAGNOSIS — I480 Paroxysmal atrial fibrillation: Secondary | ICD-10-CM | POA: Diagnosis not present

## 2015-08-16 DIAGNOSIS — I25810 Atherosclerosis of coronary artery bypass graft(s) without angina pectoris: Secondary | ICD-10-CM

## 2015-08-16 MED ORDER — AMIODARONE HCL 200 MG PO TABS: 100.0000 mg | ORAL_TABLET | Freq: Every day | ORAL | Status: DC

## 2015-08-16 NOTE — Patient Instructions (Signed)
Medication Instructions:  Your physician recommends that you continue on your current medications as directed. Please refer to the Current Medication list given to you today.   Labwork: None ordered   Testing/Procedures: None ordered   Follow-Up: Your physician wants you to follow-up in: 9 months with Dr Taylor You will receive a reminder letter in the mail two months in advance. If you don't receive a letter, please call our office to schedule the follow-up appointment.   Any Other Special Instructions Will Be Listed Below (If Applicable).     If you need a refill on your cardiac medications before your next appointment, please call your pharmacy.   

## 2015-08-16 NOTE — Progress Notes (Signed)
HPI Omar Benson returns today for followup. He is a very pleasant 81 year old man with ischemic heart disease, status post bypass surgery, status post stenting, who also has a history of atrial flutter status post catheter ablation and a history of atrial fibrillation. In the interim, the patient has had an admit with a NSTEMI and underwent cardiac cath demonstrating no change in coronary anatomy. He has all coronary arteries occluded, and all but one of his grafts is patent. He has had no recurrent symptoms. No chest pain or syncope. He has many questions about his meds.  Allergies  Allergen Reactions  . Bee Venom Anaphylaxis    Only yellow jackets   . Promethazine Hcl Other (See Comments)    syncope  . Doxycycline Hyclate Other (See Comments)    esophagitis  . Lipitor [Atorvastatin Calcium] Other (See Comments)    Myalgia/myopathy  . Other Diarrhea and Nausea And Vomiting    Seafood  . Tetracycline Other (See Comments)    Esophagitis  . Crestor [Rosuvastatin] Other (See Comments)    Myalgia/myopathy  . Tetanus Toxoid Rash     Current Outpatient Prescriptions  Medication Sig Dispense Refill  . albuterol (PROVENTIL HFA;VENTOLIN HFA) 108 (90 BASE) MCG/ACT inhaler Inhale 2 puffs into the lungs every 6 (six) hours as needed for wheezing or shortness of breath. Reported on 08/09/2015    . allopurinol (ZYLOPRIM) 300 MG tablet take 1 tablet by mouth once daily 90 tablet 0  . amiodarone (PACERONE) 200 MG tablet TAKE 1/2 TABLET BY MOUTH DAILY 45 tablet 3  . aspirin 81 MG tablet Take 162 mg by mouth daily.    Marland Kitchen b complex vitamins tablet Take 1 tablet by mouth daily.    . calcium elemental as carbonate (TUMS ULTRA 1000) 400 MG tablet Chew 1,000 mg by mouth daily as needed for heartburn.    . Cholecalciferol (VITAMIN D) 2000 UNITS CAPS Take 2,000 Units by mouth daily.     Marland Kitchen EPINEPHrine (EPIPEN) 0.3 mg/0.3 mL SOAJ injection Inject 0.3 mLs (0.3 mg total) into the muscle once. 2 Device 2  .  hydrocortisone cream 0.5 % Apply 1 application topically daily. Reported on 06/27/2015    . levothyroxine (SYNTHROID, LEVOTHROID) 125 MCG tablet take 1 tablet by mouth once daily 90 tablet 0  . MAGNESIUM PO Take 1 tablet by mouth daily.     . Multiple Vitamin (MULTIVITAMIN) tablet Take 1 tablet by mouth at bedtime.     . nitroGLYCERIN (NITROSTAT) 0.4 MG SL tablet Place 1 tablet (0.4 mg total) under the tongue every 5 (five) minutes as needed for chest pain. 25 tablet 5  . pantoprazole (PROTONIX) 40 MG tablet take 1 tablet by mouth once daily 90 tablet 0  . tiotropium (SPIRIVA HANDIHALER) 18 MCG inhalation capsule PLACE 1 CAPSULE (18 MCG TOTAL) INTO INHALER AND INHALE DAILY. 90 capsule 3   No current facility-administered medications for this visit.     Past Medical History  Diagnosis Date  . Hypothyroidism 12/05/2008  . HYPERLIPIDEMIA 09/19/2008  . GOUT 09/19/2008  . ANEMIA-IRON DEFICIENCY 09/19/2008  . HYPERTENSION 09/19/2008  . CAD 12/05/2008    s/p CABG; NSTEMI 05/2011 - LHC 05/25/11: LAD occluded, LIMA-LAD patent, ostial circumflex 50%, AV circumflex 70%, SVG-OM2 with extensive disease with thrombus (culprit vessel), RCA 80% and occluded, SVG-intermediate patent, SVG-PDA/PL A patent.  Given that the culprit vessel was a subtotally occluded heavily thrombotic graft to a smaller OM2, medical therapy was recommended.;   . Atrial  fibrillation (Omar Benson) 04/19/2008    amiodarone rx;  Echocardiogram 05/26/11: No wall motion abnormalities, mild LVH, EF 60%.  . Atrial flutter (Omar Benson) 12/05/2008    s/p RFCA  . BRADYCARDIA 12/05/2008  . AAA 09/19/2008  . PERIPHERAL VASCULAR DISEASE 09/19/2008  . GERD 09/19/2008  . DIVERTICULOSIS, COLON 09/19/2008  . RENAL INSUFFICIENCY 09/19/2008  . LOW BACK PAIN 09/19/2008  . PSA, INCREASED 09/19/2008  . COLONIC POLYPS, HX OF 09/19/2008    adenomatous polyps 07/2010  . GASTROINTESTINAL HEMORRHAGE, HX OF 04/19/2008  . NEPHROLITHIASIS, HX OF 01/11/2009  . CORONARY ARTERY BYPASS GRAFT, HX OF  12/05/2008    A. LIMA-LAD, VG-RI, VG-OM2, VG-RPD/RPL;  B. 05/2011 - NSTEMI - CATH WITH 4/5 PATENT GRAFTS AND NEW THROMBUS IN DISTAL VG-OM2 - MED RX  . ANXIETY 06/13/2010  . Arthritis   . COPD (chronic obstructive pulmonary disease) (Omar Benson)   . Myocardial infarction (Omar Benson)   . Allergy   . Benign esophageal stricture 09/2075    dilated during EGD  . Erectile dysfunction 09/13/2012  . CKD (chronic kidney disease) 09/19/2008    Qualifier: Diagnosis of  By: Jenny Reichmann MD, Hunt Oris   . Unstable angina pectoris [facility]) 05/2015    Cath 02/02 w/ patent LIMA-LAD, SVG-D1, SVG-RCA, severe dz in SVG-OM, med rx    ROS:   All systems reviewed and negative except as noted in the HPI.   Past Surgical History  Procedure Laterality Date  . Coronary artery bypass graft  2004 X 5  . S/p afib ablation  2004  . Cardiac catheterization  05/25/2011  . Colon surgery    . Esophagogastroduodenoscopy  04/28/2012    Procedure: ESOPHAGOGASTRODUODENOSCOPY (EGD);  Surgeon: [name], MD;  Location: [place];  Service: Endoscopy;  Laterality: N/A;  . Enteroscopy N/A 02/13/2014    Procedure: ENTEROSCOPY;  Surgeon: [name], MD;  Location: [place];  Service: Gastroenterology;  Laterality: N/A;  super slim  . Left heart catheterization with coronary/graft angiogram  05/25/2011    Procedure: LEFT HEART CATHETERIZATION WITH [name];  Surgeon: [name], MD;  Location: [facility] CATH LAB;  Service: Cardiovascular;;  . Cardiac catheterization N/A 06/20/2015    Procedure: Coronary/Graft Angiography;  Surgeon: Peter M Martinique, MD; LAD, CFX & RCA 100%; LIMA-LAD, SVG-D1 & SVG-RCA OK; SVG-OM severe dz, med rx      Family History  Problem Relation Age of Onset  . Diabetes Father   . Diabetes Sister   . Multiple myeloma Mother   . Cancer Mother   . Other Mother     varicose veins  . Heart disease Paternal Uncle   . Heart disease Maternal Uncle   . Lymphoma Sister     half-sister  . Heart attack Maternal  Uncle   . Heart attack Paternal Grandmother   . Heart attack Paternal Grandfather   . Hypertension Neg Hx   . Stroke Neg Hx      Social History   Social History  . Marital Status: Married    Spouse Name: Omar Benson  . Number of Children: N/A  . Years of Education: N/A   Occupational History  . self employeed - Contractor    Social History Main Topics  . Smoking status: Former Smoker -- 4.00 packs/day for 31 years    Types: Cigarettes    Quit date: 03/19/1983  . Smokeless tobacco: Never Used  . Alcohol Use: 6.0 oz/week    10 Shots of liquor per week  . Drug Use: No  .  Sexual Activity: Yes   Other Topics Concern  . Not on file   Social History Narrative   Married lives with his wife     BP 128/70 mmHg  Pulse 58  Ht _0  (1.753 m)  Wt 180 lb 12.8 oz (82.01 kg)  BMI 26.69 kg/m2  Physical Exam:  Well appearing 81 year old man,NAD HEENT: Unremarkable Neck:  No JVD, no thyromegally Back:  No CVA tenderness Lungs:  Clear with no wheezes, rales, or rhonchi. HEART:  Regular brady rhythm, no murmurs, no rubs, no clicks Abd:  soft, positive bowel sounds, no organomegally, no rebound, no guarding Ext:  2 plus pulses, no edema, no cyanosis, no clubbing Skin:  No rashes no nodules Neuro:  CN II through XII intact, motor grossly intact  EKG - sinus bradycardia with left axis deviation and RBBB   Assess/Plan: 1. CAD - he is currently asymptomatic. I discussed long acting nitrates. He does not have chronic angina and I recommended he hold off on the medication. 2. PAF - he is maintaining NSR very nicely. He will continue amiodarone. He is not a candidate for systemic anti-coagulation due to his h/o GI bleeding 3. HTN - his blood pressure is not elevated at home but has been in the office. 4. CoPD - he will continue his bronchodilators. He has a 30 pack year smoking history. He no longer smokes.   Mikle Bosworth.D.

## 2015-08-27 ENCOUNTER — Ambulatory Visit (INDEPENDENT_AMBULATORY_CARE_PROVIDER_SITE_OTHER): Payer: Medicare Other | Admitting: Physician Assistant

## 2015-08-27 ENCOUNTER — Ambulatory Visit (INDEPENDENT_AMBULATORY_CARE_PROVIDER_SITE_OTHER): Payer: Medicare Other

## 2015-08-27 VITALS — BP 116/72 | HR 50 | Temp 97.0°F | Resp 18 | Ht 67.0 in | Wt 179.0 lb

## 2015-08-27 DIAGNOSIS — R0781 Pleurodynia: Secondary | ICD-10-CM

## 2015-08-27 DIAGNOSIS — S20211A Contusion of right front wall of thorax, initial encounter: Secondary | ICD-10-CM

## 2015-08-27 DIAGNOSIS — I25810 Atherosclerosis of coronary artery bypass graft(s) without angina pectoris: Secondary | ICD-10-CM

## 2015-08-27 DIAGNOSIS — S299XXA Unspecified injury of thorax, initial encounter: Secondary | ICD-10-CM | POA: Diagnosis not present

## 2015-08-27 MED ORDER — MELOXICAM 7.5 MG PO TABS: 7.5000 mg | ORAL_TABLET | Freq: Every day | ORAL | Status: DC

## 2015-08-27 NOTE — Progress Notes (Signed)
[facility] [address] [place] [address] [phone]  Date:  08/27/2015   Name:  Omar Benson   DOB:  1935/04/24   MRN:  [id number]  PCP:  Omar Cower, MD    History of Present Illness:  Omar Benson is a 81 y.o. male patient who presents to [facility] for right rib pain.   Patient was mowing lawn a few days ago when he reached down to move a twig.  His rib hit the arm rest and he had felt a pop.  Since then there is pain with standing, walking, and deep inspiration.  The pain does not radiate anywhere but at the right side of ribs.  He denies any blood in stool or urine.  No darkened stool.  No dysuria.  He has no shortness of breath or unusual trouble with his breathing.       Patient Active Problem List   Diagnosis Date Noted  . Cough 06/29/2015  . Unstable angina ([place]) 06/19/2015  . Abdominal mass, RUQ (right upper quadrant) 04/22/2015  . Benign paroxysmal positional vertigo 02/25/2015  . Former smoker 06/30/2014  . Acute back pain 05/08/2014  . Angiodysplasia of small intestine, except duodenum with bleeding 02/13/2014  . Acute GI bleeding 02/12/2014  . Acute blood loss anemia 02/12/2014  . Skin lesion of face 12/31/2013  . Enlarged salivary gland 12/31/2013  . Right shoulder pain 06/27/2013  . Rash and nonspecific skin eruption 06/27/2013  . Erectile dysfunction 09/13/2012  . Acute sinus infection 08/22/2012  . Allergic rhinitis, cause unspecified 08/22/2012  . GI AVM (gastrointestinal arteriovenous vascular malformation) 04/28/2012  . Stricture and stenosis of esophagus 04/28/2012  . GI bleed 04/26/2012  . Non-STEMI (non-ST elevated myocardial infarction) (Omar Benson) 05/24/2011  . COPD (chronic obstructive pulmonary disease) (Omar Benson) 09/08/2010  . Hypothyroid 08/21/2010  . Preventative health care 08/21/2010  . GASTRITIS 07/21/2010  . Anxiety state 06/13/2010  . NEPHROLITHIASIS, HX OF 01/11/2009  . Coronary atherosclerosis, CABG 2004 12/05/2008  . ATRIAL  FLUTTER, CHRONIC 12/05/2008  . Sinus node dysfunction ([place]) 12/05/2008  . Hyperlipidemia LDL goal <70 09/19/2008  . GOUT 09/19/2008  . Iron deficiency anemia 09/19/2008  . Essential hypertension 09/19/2008  . Aneurysm of abdominal vessel ([place]) 09/19/2008  . PERIPHERAL VASCULAR DISEASE 09/19/2008  . GERD 09/19/2008  . DIVERTICULOSIS, COLON 09/19/2008  . CKD (chronic kidney disease) 09/19/2008  . LOW BACK PAIN 09/19/2008  . PSA, INCREASED 09/19/2008  . COLONIC POLYPS, HX OF 09/19/2008  . Atrial fibrillation ([place]) 04/19/2008  . GASTROINTESTINAL HEMORRHAGE, HX OF 04/19/2008    Past Medical History  Diagnosis Date  . Hypothyroidism 12/05/2008  . HYPERLIPIDEMIA 09/19/2008  . GOUT 09/19/2008  . ANEMIA-IRON DEFICIENCY 09/19/2008  . HYPERTENSION 09/19/2008  . CAD 12/05/2008    s/p CABG; NSTEMI 05/2011 - LHC 05/25/11: LAD occluded, LIMA-LAD patent, ostial circumflex 50%, AV circumflex 70%, SVG-OM2 with extensive disease with thrombus (culprit vessel), RCA 80% and occluded, SVG-intermediate patent, SVG-PDA/PL A patent.  Given that the culprit vessel was a subtotally occluded heavily thrombotic graft to a smaller OM2, medical therapy was recommended.;   . Atrial fibrillation (Omar Benson) 04/19/2008    amiodarone rx;  Echocardiogram 05/26/11: No wall motion abnormalities, mild LVH, EF 60%.  . Atrial flutter (Omar Benson) 12/05/2008    s/p RFCA  . BRADYCARDIA 12/05/2008  . AAA 09/19/2008  . PERIPHERAL VASCULAR DISEASE 09/19/2008  . GERD 09/19/2008  . DIVERTICULOSIS, COLON 09/19/2008  . RENAL INSUFFICIENCY 09/19/2008  . LOW BACK  PAIN 09/19/2008  . PSA, INCREASED 09/19/2008  . COLONIC POLYPS, HX OF 09/19/2008    adenomatous polyps 07/2010  . GASTROINTESTINAL HEMORRHAGE, HX OF 04/19/2008  . NEPHROLITHIASIS, HX OF 01/11/2009  . CORONARY ARTERY BYPASS GRAFT, HX OF 12/05/2008    A. LIMA-LAD, VG-RI, VG-OM2, VG-RPD/RPL;  B. 05/2011 - NSTEMI - CATH WITH 4/5 PATENT GRAFTS AND NEW THROMBUS IN DISTAL VG-OM2 - MED RX  . ANXIETY 06/13/2010  . Arthritis    . COPD (chronic obstructive pulmonary disease) ([place])   . Myocardial infarction (Omar Benson)   . Allergy   . Benign esophageal stricture 09/2075    dilated during EGD  . Erectile dysfunction 09/13/2012  . CKD (chronic kidney disease) 09/19/2008    Qualifier: Diagnosis of  By: Jenny Reichmann MD, Hunt Oris   . Unstable angina pectoris [facility]) 05/2015    Cath 02/02 w/ patent LIMA-LAD, SVG-D1, SVG-RCA, severe dz in SVG-OM, med rx    Past Surgical History  Procedure Laterality Date  . Coronary artery bypass graft  2004 X 5  . S/p afib ablation  2004  . Cardiac catheterization  05/25/2011  . Colon surgery    . Esophagogastroduodenoscopy  04/28/2012    Procedure: ESOPHAGOGASTRODUODENOSCOPY (EGD);  Surgeon: [name], MD;  Location: [place];  Service: Endoscopy;  Laterality: N/A;  . Enteroscopy N/A 02/13/2014    Procedure: ENTEROSCOPY;  Surgeon: [name], MD;  Location: [place];  Service: Gastroenterology;  Laterality: N/A;  super slim  . Left heart catheterization with coronary/graft angiogram  05/25/2011    Procedure: LEFT HEART CATHETERIZATION WITH [name];  Surgeon: [name], MD;  Location: [facility] CATH LAB;  Service: Cardiovascular;;  . Cardiac catheterization N/A 06/20/2015    Procedure: Coronary/Graft Angiography;  Surgeon: Peter M Martinique, MD; LAD, CFX & RCA 100%; LIMA-LAD, SVG-D1 & SVG-RCA OK; SVG-OM severe dz, med rx     Social History  Substance Use Topics  . Smoking status: Former Smoker -- 4.00 packs/day for 31 years    Types: Cigarettes    Quit date: 03/19/1983  . Smokeless tobacco: Never Used  . Alcohol Use: 6.0 oz/week    10 Shots of liquor per week    Family History  Problem Relation Age of Onset  . Diabetes Father   . Diabetes Sister   . Multiple myeloma Mother   . Cancer Mother   . Other Mother     varicose veins  . Heart disease Paternal Uncle   . Heart disease Maternal Uncle   . Lymphoma Sister     half-sister  . Heart attack Maternal Uncle   .  Heart attack Paternal Grandmother   . Heart attack Paternal Grandfather   . Hypertension Neg Hx   . Stroke Neg Hx     Allergies  Allergen Reactions  . Bee Venom Anaphylaxis    Only yellow jackets   . Promethazine Hcl Other (See Comments)    syncope  . Doxycycline Hyclate Other (See Comments)    esophagitis  . Lipitor [Atorvastatin Calcium] Other (See Comments)    Myalgia/myopathy  . Other Diarrhea and Nausea And Vomiting    Seafood  . Tetracycline Other (See Comments)    Esophagitis  . Crestor [Rosuvastatin] Other (See Comments)    Myalgia/myopathy  . Tetanus Toxoid Rash    Medication list has been reviewed and updated.  Current Outpatient Prescriptions on File Prior to Visit  Medication Sig Dispense Refill  . albuterol (PROVENTIL HFA;VENTOLIN HFA) 108 (90 BASE) MCG/ACT inhaler Inhale  2 puffs into the lungs every 6 (six) hours as needed for wheezing or shortness of breath. Reported on 08/09/2015    . allopurinol (ZYLOPRIM) 300 MG tablet take 1 tablet by mouth once daily 90 tablet 0  . amiodarone (PACERONE) 200 MG tablet Take 0.5 tablets (100 mg total) by mouth daily. 45 tablet 3  . aspirin 81 MG tablet Take 162 mg by mouth daily.    Marland Kitchen b complex vitamins tablet Take 1 tablet by mouth daily.    . calcium elemental as carbonate (TUMS ULTRA 1000) 400 MG tablet Chew 1,000 mg by mouth daily as needed for heartburn.    . Cholecalciferol (VITAMIN D) 2000 UNITS CAPS Take 2,000 Units by mouth daily.     Marland Kitchen EPINEPHrine (EPIPEN) 0.3 mg/0.3 mL SOAJ injection Inject 0.3 mLs (0.3 mg total) into the muscle once. 2 Device 2  . hydrocortisone cream 0.5 % Apply 1 application topically daily. Reported on 06/27/2015    . levothyroxine (SYNTHROID, LEVOTHROID) 125 MCG tablet take 1 tablet by mouth once daily 90 tablet 0  . MAGNESIUM PO Take 1 tablet by mouth daily.     . Multiple Vitamin (MULTIVITAMIN) tablet Take 1 tablet by mouth at bedtime.     . nitroGLYCERIN (NITROSTAT) 0.4 MG SL tablet Place 1  tablet (0.4 mg total) under the tongue every 5 (five) minutes as needed for chest pain. 25 tablet 5  . pantoprazole (PROTONIX) 40 MG tablet take 1 tablet by mouth once daily 90 tablet 0  . tiotropium (SPIRIVA HANDIHALER) 18 MCG inhalation capsule PLACE 1 CAPSULE (18 MCG TOTAL) INTO INHALER AND INHALE DAILY. 90 capsule 3   No current facility-administered medications on file prior to visit.    ROS ROS otherwise unremarkable unless listed above.   Physical Examination: BP 116/72 mmHg  Pulse 50  Temp(Src) 97 F (36.1 C) (Oral)  Resp 18  Ht 5' 7" (1.702 m)  Wt 179 lb (81.194 kg)  BMI 28.03 kg/m2  SpO2 98% Ideal Body Weight: Weight in (lb) to have BMI = 25: 159.3  Physical Exam  Constitutional: He is oriented to person, place, and time. He appears well-developed and well-nourished. No distress.  HENT:  Head: Normocephalic and atraumatic.  Eyes: Conjunctivae and EOM are normal. Pupils are equal, round, and reactive to light.  Cardiovascular: Normal rate.   Pulmonary/Chest: Effort normal. No apnea. No respiratory distress. He has no decreased breath sounds. He has no wheezes. He has no rhonchi.  Musculoskeletal:  Right lateral side with yellowing ecchymosis.  Right lateral costal border pain upon palpation.  palpating the right side costal border at the base proximal causes great pain.  No tenderness along the abdomen.  Normal bowel sounds.  Neurological: He is alert and oriented to person, place, and time.  Skin: Skin is warm and dry. He is not diaphoretic.  Psychiatric: He has a normal mood and affect. His behavior is normal.   Dg Ribs Unilateral W/chest Right  08/27/2015  CLINICAL DATA:  Pain after hitting right side of chest on lawnmower EXAM: RIGHT RIBS AND CHEST - 3+ VIEW COMPARISON:  Chest radiograph June 19, 2015; chest CT June 19, 2015 FINDINGS: Frontal chest as well as bilateral oblique views were obtained. There is mild scarring in the left mid lower lung zones. There is  no edema or consolidation. Heart size and pulmonary vascularity are normal. Patient is status post coronary artery bypass grafting. No adenopathy apparent. No pneumothorax or pleural effusion. No rib fracture evident. IMPRESSION:  No demonstrable rib fracture. No edema or consolidation. Mild scarring on the left. Electronically Signed   By: Lowella Grip III M.D.   On: 08/27/2015 17:12   Assessment and Plan: NOCHUM FENTER is a 81 y.o. male who is here today for cc of rib pain. No fractures detected.   --i have advised that we start an anti-inflammatory at a low dose.  He has a hx of GI bleed--while he was on the xarelto.  He is on a baby aspirin at this time. --advised ice and appropriate breathing.  He will rtc in 7-10 days if no improvement.   Rib pain on right side - Plan: DG Ribs Unilateral W/Chest Right, meloxicam (MOBIC) 7.5 MG tablet   Ivar Drape, PA-C [facility] 08/27/2015 4:11 PM

## 2015-08-27 NOTE — Patient Instructions (Addendum)
IF you received an x-ray today, you will receive an invoice from [facility]. Please contact [facility] at [phone] with questions or concerns regarding your invoice.   IF you received labwork today, you will receive an invoice from [employer]. Please contact [facility] at [phone] with questions or concerns regarding your invoice.   Our billing staff will not be able to assist you with questions regarding bills from these companies.  You will be contacted with the lab results as soon as they are available. The fastest way to get your results is to activate your My Chart account. Instructions are located on the last page of this paperwork. If you have not heard from [place] regarding the results in 2 weeks, please contact this office.    Please ice the area three times per day for 15 mintues.   I would like you to make sure that you are hydrating well and eating well. You can use tylenol for your pain. Do not take the mobic with naproxen or ibuprofen.  This will be your anti-inflammatory. Contusion A contusion is a deep bruise. Contusions are the result of a blunt injury to tissues and muscle fibers under the skin. The injury causes bleeding under the skin. The skin overlying the contusion may turn blue, purple, or yellow. Minor injuries will give you a painless contusion, but more severe contusions may stay painful and swollen for a few weeks.  CAUSES  This condition is usually caused by a blow, trauma, or direct force to an area of the body. SYMPTOMS  Symptoms of this condition include:  Swelling of the injured area.  Pain and tenderness in the injured area.  Discoloration. The area may have redness and then turn blue, purple, or yellow. DIAGNOSIS  This condition is diagnosed based on a physical exam and medical history. An X-ray, CT scan, or MRI may be needed to determine if there are any associated injuries, such as broken bones  (fractures). TREATMENT  Specific treatment for this condition depends on what area of the body was injured. In general, the best treatment for a contusion is resting, icing, applying pressure to (compression), and elevating the injured area. This is often called the RICE strategy. Over-the-counter anti-inflammatory medicines may also be recommended for pain control.  HOME CARE INSTRUCTIONS   Rest the injured area.  If directed, apply ice to the injured area:  Put ice in a plastic bag.  Place a towel between your skin and the bag.  Leave the ice on for 20 minutes, 2-3 times per day.  If directed, apply light compression to the injured area using an elastic bandage. Make sure the bandage is not wrapped too tightly. Remove and reapply the bandage as directed by your health care provider.  If possible, raise (elevate) the injured area above the level of your heart while you are sitting or lying down.  Take over-the-counter and prescription medicines only as told by your health care provider. SEEK MEDICAL CARE IF:  Your symptoms do not improve after several days of treatment.  Your symptoms get worse.  You have difficulty moving the injured area. SEEK IMMEDIATE MEDICAL CARE IF:   You have severe pain.  You have numbness in a hand or foot.  Your hand or foot turns pale or cold.   This information is not intended to replace advice given to you by your health care provider. Make sure you discuss any questions you have with your health care  provider.   Document Released: 02/11/2005 Document Revised: 01/23/2015 Document Reviewed: 09/19/2014 Elsevier Interactive Patient Education [employer].

## 2015-09-04 ENCOUNTER — Other Ambulatory Visit: Payer: Self-pay | Admitting: *Deleted

## 2015-09-04 MED ORDER — TIOTROPIUM BROMIDE MONOHYDRATE 18 MCG IN CAPS: ORAL_CAPSULE | RESPIRATORY_TRACT | Status: DC

## 2015-09-04 NOTE — Telephone Encounter (Signed)
Received call pt states he is needing refill on his spiriva. Verified insurance inform will send to [facility]...Johny Chess

## 2015-09-05 ENCOUNTER — Other Ambulatory Visit: Payer: Self-pay | Admitting: Internal Medicine

## 2015-09-23 ENCOUNTER — Ambulatory Visit (INDEPENDENT_AMBULATORY_CARE_PROVIDER_SITE_OTHER): Payer: Medicare Other | Admitting: Pulmonary Disease

## 2015-09-23 DIAGNOSIS — J449 Chronic obstructive pulmonary disease, unspecified: Secondary | ICD-10-CM

## 2015-09-24 NOTE — Progress Notes (Signed)
Patient canceled

## 2015-11-06 ENCOUNTER — Other Ambulatory Visit: Payer: Self-pay

## 2015-11-06 MED ORDER — ALLOPURINOL 300 MG PO TABS: 300.0000 mg | ORAL_TABLET | Freq: Every day | ORAL | Status: DC

## 2015-11-12 ENCOUNTER — Other Ambulatory Visit: Payer: Self-pay | Admitting: *Deleted

## 2015-11-12 MED ORDER — ALLOPURINOL 300 MG PO TABS: 300.0000 mg | ORAL_TABLET | Freq: Every day | ORAL | Status: DC

## 2015-11-14 ENCOUNTER — Other Ambulatory Visit: Payer: Self-pay | Admitting: *Deleted

## 2015-11-14 MED ORDER — LEVOTHYROXINE SODIUM 125 MCG PO TABS: 125.0000 ug | ORAL_TABLET | Freq: Every day | ORAL | Status: DC

## 2015-11-14 MED ORDER — PANTOPRAZOLE SODIUM 40 MG PO TBEC: 40.0000 mg | DELAYED_RELEASE_TABLET | Freq: Every day | ORAL | Status: DC

## 2016-02-13 ENCOUNTER — Other Ambulatory Visit: Payer: Self-pay | Admitting: Internal Medicine

## 2016-02-14 ENCOUNTER — Encounter: Payer: Self-pay | Admitting: Family Medicine

## 2016-02-14 ENCOUNTER — Ambulatory Visit (INDEPENDENT_AMBULATORY_CARE_PROVIDER_SITE_OTHER): Payer: Medicare Other | Admitting: Family Medicine

## 2016-02-14 VITALS — BP 140/80 | HR 54 | Temp 97.6°F | Ht 67.0 in | Wt 182.1 lb

## 2016-02-14 DIAGNOSIS — I25810 Atherosclerosis of coronary artery bypass graft(s) without angina pectoris: Secondary | ICD-10-CM

## 2016-02-14 DIAGNOSIS — M109 Gout, unspecified: Secondary | ICD-10-CM | POA: Diagnosis not present

## 2016-02-14 DIAGNOSIS — M25561 Pain in right knee: Secondary | ICD-10-CM

## 2016-02-14 MED ORDER — ALLOPURINOL 300 MG PO TABS: 300.0000 mg | ORAL_TABLET | Freq: Every day | ORAL | 0 refills | Status: DC

## 2016-02-14 NOTE — Patient Instructions (Signed)
BEFORE YOU LEAVE: -follow up: in 4 weeks -IT band exercises  Heat 15 minutes twice daily  Topical sports creams  If you can take tylenol, may use this for pain per instructions as needed  Do the exercises provided 4 days per week.  Follow up sooner if worsening or new concerns.

## 2016-02-14 NOTE — Progress Notes (Signed)
HPI:  Omar Benson is a very pleasant 81 year old here for an acute visit for right knee pain. He reports this pain started about a week ago. He has been doing remodeling on the house and has been up-and-down waters quite a bit. The pain is in the lateral knee and IT band region, is intermittent, occurs only with specific movements. He denies known injury, trauma, redness, swelling, rash, weakness, numbness, clicking, locking, giving away, malaise or fevers. Rest, avoiding aggravating movements and a knee brace helped a little bit. He has a history of gout, but does not feel that this is a gout flare. He requests a 30 day refill on his allopurinol, as he reports he is almost out of this and his PCP is not in the office.  ROS: See pertinent positives and negatives per HPI.  Past Medical History:  Diagnosis Date  . AAA 09/19/2008  . Allergy   . ANEMIA-IRON DEFICIENCY 09/19/2008  . ANXIETY 06/13/2010  . Arthritis   . Atrial fibrillation ([place]) 04/19/2008   amiodarone rx;  Echocardiogram 05/26/11: No wall motion abnormalities, mild LVH, EF 60%.  . Atrial flutter ([place]) 12/05/2008   s/p RFCA  . Benign esophageal stricture 09/2075   dilated during EGD  . BRADYCARDIA 12/05/2008  . CAD 12/05/2008   s/p CABG; NSTEMI 05/2011 - LHC 05/25/11: LAD occluded, LIMA-LAD patent, ostial circumflex 50%, AV circumflex 70%, SVG-OM2 with extensive disease with thrombus (culprit vessel), RCA 80% and occluded, SVG-intermediate patent, SVG-PDA/PL A patent.  Given that the culprit vessel was a subtotally occluded heavily thrombotic graft to a smaller OM2, medical therapy was recommended.;   . CKD (chronic kidney disease) 09/19/2008   Qualifier: Diagnosis of  By: Jenny Reichmann MD, [place], HX OF 09/19/2008   adenomatous polyps 07/2010  . COPD (chronic obstructive pulmonary disease) ([place])   . CORONARY ARTERY BYPASS GRAFT, HX OF 12/05/2008   A. LIMA-LAD, VG-RI, VG-OM2, VG-RPD/RPL;  B. 05/2011 - NSTEMI - CATH WITH 4/5 PATENT GRAFTS  AND NEW THROMBUS IN DISTAL VG-OM2 - MED RX  . DIVERTICULOSIS, COLON 09/19/2008  . Erectile dysfunction 09/13/2012  . GASTROINTESTINAL HEMORRHAGE, HX OF 04/19/2008  . GERD 09/19/2008  . GOUT 09/19/2008  . HYPERLIPIDEMIA 09/19/2008  . HYPERTENSION 09/19/2008  . Hypothyroidism 12/05/2008  . LOW BACK PAIN 09/19/2008  . Myocardial infarction ([place])   . NEPHROLITHIASIS, HX OF 01/11/2009  . PERIPHERAL VASCULAR DISEASE 09/19/2008  . PSA, INCREASED 09/19/2008  . RENAL INSUFFICIENCY 09/19/2008  . Unstable angina pectoris ([place]) 05/2015   Cath 02/02 w/ patent LIMA-LAD, SVG-D1, SVG-RCA, severe dz in SVG-OM, med rx    Past Surgical History:  Procedure Laterality Date  . CARDIAC CATHETERIZATION  05/25/2011  . CARDIAC CATHETERIZATION N/A 06/20/2015   Procedure: Coronary/Graft Angiography;  Surgeon: Peter M Martinique, MD; LAD, CFX & RCA 100%; LIMA-LAD, SVG-D1 & SVG-RCA OK; SVG-OM severe dz, med rx   . COLON SURGERY    . CORONARY ARTERY BYPASS GRAFT  2004 X 5  . ENTEROSCOPY N/A 02/13/2014   Procedure: ENTEROSCOPY;  Surgeon: [name], MD;  Location: [facility] ENDOSCOPY;  Service: Gastroenterology;  Laterality: N/A;  super slim  . ESOPHAGOGASTRODUODENOSCOPY  04/28/2012   Procedure: ESOPHAGOGASTRODUODENOSCOPY (EGD);  Surgeon: [name], MD;  Location: [place];  Service: Endoscopy;  Laterality: N/A;  . LEFT HEART CATHETERIZATION WITH CORONARY/GRAFT ANGIOGRAM  05/25/2011   Procedure: LEFT HEART CATHETERIZATION WITH [name];  Surgeon: [name], MD;  Location: [facility] CATH LAB;  Service: Cardiovascular;;  .  s/p afib ablation  2004    Family History  Problem Relation Age of Onset  . Diabetes Father   . Diabetes Sister   . Multiple myeloma Mother   . Cancer Mother   . Other Mother     varicose veins  . Heart disease Paternal Uncle   . Heart disease Maternal Uncle   . Lymphoma Sister     half-sister  . Heart attack Maternal Uncle   . Heart attack Paternal Grandmother   . Heart attack Paternal  Grandfather   . Hypertension Neg Hx   . Stroke Neg Hx     Social History   Social History  . Marital status: Married    Spouse name: [name]  . Number of children: N/A  . Years of education: N/A   Occupational History  . 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 Smoking status: Former Smoker    Packs/day: 4.00    Years: 31.00    Types: Cigarettes    Quit date: 03/19/1983  . Smokeless tobacco: Never Used  . Alcohol use 6.0 oz/week    10 Shots of liquor per week  . Drug use: No  . Sexual activity: Yes   Other Topics Concern  . None   Social History Narrative   Married lives with his wife     Current Outpatient Prescriptions:  .  albuterol (PROVENTIL HFA;VENTOLIN HFA) 108 (90 BASE) MCG/ACT inhaler, Inhale 2 puffs into the lungs every 6 (six) hours as needed for wheezing or shortness of breath. Reported on 08/09/2015, Disp: , Rfl:  .  allopurinol (ZYLOPRIM) 300 MG tablet, Take 1 tablet (300 mg total) by mouth daily., Disp: 30 tablet, Rfl: 0 .  amiodarone (PACERONE) 200 MG tablet, Take 0.5 tablets (100 mg total) by mouth daily., Disp: 45 tablet, Rfl: 3 .  aspirin 81 MG tablet, Take 162 mg by mouth daily., Disp: , Rfl:  .  b complex vitamins tablet, Take 1 tablet by mouth daily., Disp: , Rfl:  .  calcium elemental as carbonate (TUMS ULTRA 1000) 400 MG tablet, Chew 1,000 mg by mouth daily as needed for heartburn., Disp: , Rfl:  .  Cholecalciferol (VITAMIN D) 2000 UNITS CAPS, Take 2,000 Units by mouth daily. , Disp: , Rfl:  .  EPINEPHrine (EPIPEN) 0.3 mg/0.3 mL SOAJ injection, Inject 0.3 mLs (0.3 mg total) into the muscle once., Disp: 2 Device, Rfl: 2 .  hydrocortisone cream 0.5 %, Apply 1 application topically daily. Reported on 06/27/2015, Disp: , Rfl:  .  levothyroxine (SYNTHROID, LEVOTHROID) 125 MCG tablet, Take 1 tablet (125 mcg total) by mouth daily., Disp: 90 tablet, Rfl: 1 .  MAGNESIUM PO, Take 1 tablet by mouth daily. , Disp: , Rfl:  .   Multiple Vitamin (MULTIVITAMIN) tablet, Take 1 tablet by mouth at bedtime. , Disp: , Rfl:  .  nitroGLYCERIN (NITROSTAT) 0.4 MG SL tablet, PLACE 1 TABLET UNDER THE TONGUE EVERY 5 MINUTES AS NEEDED FOR CHEST PAIN FOR UP TO 3 DOSES., Disp: 25 tablet, Rfl: 5 .  pantoprazole (PROTONIX) 40 MG tablet, Take 1 tablet (40 mg total) by mouth daily., Disp: 90 tablet, Rfl: 1 .  tiotropium (SPIRIVA HANDIHALER) 18 MCG inhalation capsule, PLACE 1 CAPSULE (18 MCG TOTAL) INTO INHALER AND INHALE DAILY., Disp: 90 capsule, Rfl: 3  EXAM:  Vitals:   02/14/16 1406  BP: 140/80  Pulse: (!) 54  Temp: 97.6 F (36.4 C)    Body mass index is 28.52  kg/m.  GENERAL: vitals reviewed and listed above, alert, oriented, appears well hydrated and in no acute distress  HEENT: atraumatic, conjunttiva clear, no obvious abnormalities on inspection of external nose and ears  NECK: no obvious masses on inspection  LUNGS: clear to auscultation bilaterally, no wheezes, rales or rhonchi, good air movement  CV: HRRR, no peripheral edema  MS: moves all extremities without noticeable abnormality Normal inspection of both knees with no swelling appreciated. No rash or redness. Normal strength and sensitivity to light touch in the impacted extremity. Tenderness to palpation along the distal portion of the right IT band. No other significant tenderness on exam of the thigh, knee or leg. Special testing reveals negative Lockman, anterior/posterior drawer, [place]. No laxity on valgus and varus testing of the knee, some pain with varus stress. Positive patellar crepitus. Mild VMO atrophy. Neurovascularly intact distally.  PSYCH: pleasant and cooperative, no obvious depression or anxiety  ASSESSMENT AND PLAN:  Discussed the following assessment and plan: More than 50% of over 25 minutes spent in total in caring for this patient was spent face-to-face with the patient, counseling and/or coordinating care.   Right knee pain -we  discussed possible serious and likely etiologies, workup and treatment, treatment risks and return precautions - suspect primary issue is IT band pathology, also with some OA, weak VMO. -after this discussion, Tennyson opted for HEP, topical sports creams, prn analgesic - he uses caution with nsaids due to renal disease. -he is not interested in anything that is sedating, muscle relaxer or opiods - this Is good. -refill is allopurinol as requested -follow up advised 4 weeks -of course, we advised Shaarav  to return or notify a doctor immediately if symptoms worsen or persist or new concerns arise.   Patient Instructions  BEFORE YOU LEAVE: -follow up: in 4 weeks -IT band exercises  Heat 15 minutes twice daily  Topical sports creams  If you can take tylenol, may use this for pain per instructions as needed  Do the exercises provided 4 days per week.  Follow up sooner if worsening or new concerns.    Colin Benton R., DO

## 2016-02-14 NOTE — Progress Notes (Signed)
Pre visit review using our clinic review tool, if applicable. No additional management support is needed unless otherwise documented below in the visit note. 

## 2016-02-18 ENCOUNTER — Telehealth: Payer: Self-pay | Admitting: Internal Medicine

## 2016-02-18 NOTE — Telephone Encounter (Signed)
LM for pt to sched flu/pneumonia vac

## 2016-03-03 ENCOUNTER — Encounter: Payer: Self-pay | Admitting: Internal Medicine

## 2016-03-04 ENCOUNTER — Encounter: Payer: Self-pay | Admitting: Internal Medicine

## 2016-03-04 ENCOUNTER — Ambulatory Visit (INDEPENDENT_AMBULATORY_CARE_PROVIDER_SITE_OTHER): Payer: Medicare Other | Admitting: Internal Medicine

## 2016-03-04 VITALS — BP 152/84 | HR 54 | Ht 67.0 in | Wt 184.2 lb

## 2016-03-04 DIAGNOSIS — I25810 Atherosclerosis of coronary artery bypass graft(s) without angina pectoris: Secondary | ICD-10-CM | POA: Diagnosis not present

## 2016-03-04 DIAGNOSIS — I480 Paroxysmal atrial fibrillation: Secondary | ICD-10-CM | POA: Diagnosis not present

## 2016-03-04 NOTE — Progress Notes (Signed)
HPI Mr. Berrios returns today for followup. He is a very pleasant 81 year old man with ischemic heart disease, status post bypass surgery, status post stenting, who also has a history of atrial flutter status post catheter ablation and a history of atrial fibrillation. In the interim, the patient has had problems with palpitations and dyspnea with exertion along with fatigue. He does not have syncope. No angina. Allergies  Allergen Reactions  . Bee Venom Anaphylaxis    Only yellow jackets   . Promethazine Hcl Other (See Comments)    syncope  . Doxycycline Hyclate Other (See Comments)    esophagitis  . Lipitor [Atorvastatin Calcium] Other (See Comments)    Myalgia/myopathy  . Other Diarrhea and Nausea And Vomiting    Seafood  . Tetracycline Other (See Comments)    Esophagitis  . Crestor [Rosuvastatin] Other (See Comments)    Myalgia/myopathy  . Tetanus Toxoid Rash     Current Outpatient Prescriptions  Medication Sig Dispense Refill  . albuterol (PROVENTIL HFA;VENTOLIN HFA) 108 (90 BASE) MCG/ACT inhaler Inhale 2 puffs into the lungs every 6 (six) hours as needed for wheezing or shortness of breath. Reported on 08/09/2015    . allopurinol (ZYLOPRIM) 300 MG tablet Take 1 tablet (300 mg total) by mouth daily. 30 tablet 0  . amiodarone (PACERONE) 200 MG tablet Take 0.5 tablets (100 mg total) by mouth daily. 45 tablet 3  . aspirin 81 MG tablet Take 162 mg by mouth daily.    Marland Kitchen b complex vitamins tablet Take 1 tablet by mouth daily.    . calcium elemental as carbonate (TUMS ULTRA 1000) 400 MG tablet Chew 1,000 mg by mouth daily as needed for heartburn.    . Cholecalciferol (VITAMIN D) 2000 UNITS CAPS Take 2,000 Units by mouth daily.     Marland Kitchen EPINEPHrine (EPIPEN) 0.3 mg/0.3 mL SOAJ injection Inject 0.3 mLs (0.3 mg total) into the muscle once. 2 Device 2  . hydrocortisone cream 0.5 % Apply 1 application topically daily. Reported on 06/27/2015    . levothyroxine (SYNTHROID, LEVOTHROID) 125 MCG  tablet Take 1 tablet (125 mcg total) by mouth daily. 90 tablet 1  . MAGNESIUM PO Take 1 tablet by mouth daily.     . Multiple Vitamin (MULTIVITAMIN) tablet Take 1 tablet by mouth at bedtime.     . nitroGLYCERIN (NITROSTAT) 0.4 MG SL tablet PLACE 1 TABLET UNDER THE TONGUE EVERY 5 MINUTES AS NEEDED FOR CHEST PAIN FOR UP TO 3 DOSES. 25 tablet 5  . pantoprazole (PROTONIX) 40 MG tablet Take 1 tablet (40 mg total) by mouth daily. 90 tablet 1  . tiotropium (SPIRIVA HANDIHALER) 18 MCG inhalation capsule PLACE 1 CAPSULE (18 MCG TOTAL) INTO INHALER AND INHALE DAILY. 90 capsule 3   No current facility-administered medications for this visit.      Past Medical History:  Diagnosis Date  . AAA 09/19/2008  . Allergy   . ANEMIA-IRON DEFICIENCY 09/19/2008  . ANXIETY 06/13/2010  . Arthritis   . Atrial fibrillation ([place]) 04/19/2008   amiodarone rx;  Echocardiogram 05/26/11: No wall motion abnormalities, mild LVH, EF 60%.  . Atrial flutter ([place]) 12/05/2008   s/p RFCA  . Benign esophageal stricture 09/2075   dilated during EGD  . BRADYCARDIA 12/05/2008  . CAD 12/05/2008   s/p CABG; NSTEMI 05/2011 - LHC 05/25/11: LAD occluded, LIMA-LAD patent, ostial circumflex 50%, AV circumflex 70%, SVG-OM2 with extensive disease with thrombus (culprit vessel), RCA 80% and occluded, SVG-intermediate patent, SVG-PDA/PL A patent.  Given that the culprit vessel was a subtotally occluded heavily thrombotic graft to a smaller OM2, medical therapy was recommended.;   . CKD (chronic kidney disease) 09/19/2008   Qualifier: Diagnosis of  By: Jenny Reichmann MD, [place], HX OF 09/19/2008   adenomatous polyps 07/2010  . COPD (chronic obstructive pulmonary disease) ([place])   . CORONARY ARTERY BYPASS GRAFT, HX OF 12/05/2008   A. LIMA-LAD, VG-RI, VG-OM2, VG-RPD/RPL;  B. 05/2011 - NSTEMI - CATH WITH 4/5 PATENT GRAFTS AND NEW THROMBUS IN DISTAL VG-OM2 - MED RX  . DIVERTICULOSIS, COLON 09/19/2008  . Erectile dysfunction 09/13/2012  . GASTROINTESTINAL  HEMORRHAGE, HX OF 04/19/2008  . GERD 09/19/2008  . GOUT 09/19/2008  . HYPERLIPIDEMIA 09/19/2008  . HYPERTENSION 09/19/2008  . Hypothyroidism 12/05/2008  . LOW BACK PAIN 09/19/2008  . Myocardial infarction   . NEPHROLITHIASIS, HX OF 01/11/2009  . PERIPHERAL VASCULAR DISEASE 09/19/2008  . PSA, INCREASED 09/19/2008  . RENAL INSUFFICIENCY 09/19/2008  . Unstable angina pectoris ([place]) 05/2015   Cath 02/02 w/ patent LIMA-LAD, SVG-D1, SVG-RCA, severe dz in SVG-OM, med rx    ROS:   All systems reviewed and negative except as noted in the HPI.   Past Surgical History:  Procedure Laterality Date  . CARDIAC CATHETERIZATION  05/25/2011  . CARDIAC CATHETERIZATION N/A 06/20/2015   Procedure: Coronary/Graft Angiography;  Surgeon: Peter M Martinique, MD; LAD, CFX & RCA 100%; LIMA-LAD, SVG-D1 & SVG-RCA OK; SVG-OM severe dz, med rx   . COLON SURGERY    . CORONARY ARTERY BYPASS GRAFT  2004 X 5  . ENTEROSCOPY N/A 02/13/2014   Procedure: ENTEROSCOPY;  Surgeon: [name], MD;  Location: [facility] ENDOSCOPY;  Service: Gastroenterology;  Laterality: N/A;  super slim  . ESOPHAGOGASTRODUODENOSCOPY  04/28/2012   Procedure: ESOPHAGOGASTRODUODENOSCOPY (EGD);  Surgeon: [name], MD;  Location: [place];  Service: Endoscopy;  Laterality: N/A;  . LEFT HEART CATHETERIZATION WITH CORONARY/GRAFT ANGIOGRAM  05/25/2011   Procedure: LEFT HEART CATHETERIZATION WITH [name];  Surgeon: [name], MD;  [facility] C[facility] LAB;  Service: Cardiovascular;;  . s/p afib ablation  2004     Family History  Problem Relation Age of Onset  . Multiple myeloma Mother   . Cancer Mother   . Other Mother     varicose veins  . Diabetes Father   . Diabetes Sister   . Heart disease Paternal Uncle   . Heart disease Maternal Uncle   . Lymphoma Sister     half-sister  . Heart attack Maternal Uncle   . Heart attack Paternal Grandmother   . Heart attack Paternal Grandfather   . Hypertension Neg Hx   . Stroke Neg Hx       Social History   Social History  . Marital status: Married    Spouse name: [name]  . Number of children: N/A  . Years of education: N/A   Occupational History  . 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 Smoking status: Former Smoker    Packs/day: 4.00    Years: 31.00    Types: Cigarettes    Quit date: 03/19/1983  . Smokeless tobacco: Never Used  . Alcohol use 6.0 oz/week    10 Shots of liquor per week  . Drug use: No  . Sexual activity: Yes   Other Topics Concern  . Not on file   Social History Narrative   Married lives with his wife  BP (!) 152/84   Pulse (!) 54   Ht '5\' 7"'$  (1.702 m)   Wt 184 lb 4 oz (83.6 kg)   BMI 28.86 kg/m   Physical Exam:  Well appearing 81 year old man,NAD HEENT: Unremarkable Neck:  6 cm JVD, no thyromegally Back:  No CVA tenderness Lungs:  Clear with no wheezes, rales, or rhonchi. HEART:  Regular brady rhythm, no murmurs, no rubs, no clicks Abd:  soft, positive bowel sounds, no organomegally, no rebound, no guarding Ext:  2 plus pulses, no edema, no cyanosis, no clubbing Skin:  No rashes no nodules Neuro:  CN II through XII intact, motor grossly intact  EKG - sinus bradycardia with left axis deviation   Assess/Plan: 1. CAD - he is currently asymptomatic except for fatigue and sob. No angina. Would consider an exercise test in the future. 2. PAF - he is maintaining NSR very nicely. He will continue amiodarone at low dose. He is not a candidate for systemic anti-coagulation due to his h/o GI bleeding 3. HTN - his blood pressure is not elevated at home but has been in the office. 4. CoPD - he will continue his bronchodilators. He has a 30 pack year smoking history. He no longer smokes.  5. Palpitations - unclear if he is having AVWB or PVC's or recurrent atrial fib. He feels like his heart is skipping when he exercises. He will wear a 48 hour holter monitor. Additional rec's will be made based  on the results of the monitor.  Mikle Bosworth.D.

## 2016-03-04 NOTE — Patient Instructions (Addendum)
Medication Instructions:  Your physician recommends that you continue on your current medications as directed. Please refer to the Current Medication list given to you today.   Labwork: None Ordered   Testing/Procedures: Your physician has recommended that you wear a 48 hour holter monitor. Holter monitors are medical devices that record the heart's electrical activity. Doctors most often [place] these monitors to diagnose arrhythmias. Arrhythmias are problems with the speed or rhythm of the heartbeat. The monitor is a small, portable device. You can wear one while you do your normal daily activities. This is usually used to diagnose what is causing palpitations/syncope (passing out). --please schedule asap    Follow-Up: Follow-up to be determined after results of event monitor.   Any Other Special Instructions Will Be Listed Below (If Applicable). After reviewing results of event monitor, may possibly schedule a stress test.    If you need a refill on your cardiac medications before your next appointment, please call your pharmacy.

## 2016-03-05 ENCOUNTER — Encounter: Payer: Self-pay | Admitting: Internal Medicine

## 2016-03-06 ENCOUNTER — Ambulatory Visit (INDEPENDENT_AMBULATORY_CARE_PROVIDER_SITE_OTHER): Payer: Medicare Other

## 2016-03-06 DIAGNOSIS — I480 Paroxysmal atrial fibrillation: Secondary | ICD-10-CM

## 2016-03-17 ENCOUNTER — Telehealth: Payer: Self-pay | Admitting: Internal Medicine

## 2016-03-17 NOTE — Telephone Encounter (Signed)
Follow Up:    Pt would like his  holter monitor results from 03-06-16 please.

## 2016-03-19 NOTE — Telephone Encounter (Signed)
Hit by error

## 2016-03-19 NOTE — Telephone Encounter (Signed)
F/u Message ° °Pt returning RN call. Please call back to discuss  °

## 2016-03-19 NOTE — Telephone Encounter (Signed)
F/u  Patient is returning a call from [facility]

## 2016-03-31 NOTE — Telephone Encounter (Signed)
Received incoming call from pt. Reviewed holter monitor results with pt. Informed I will follow-up with pt after receiving f/u recommendation from Dr. Lovena Le.

## 2016-03-31 NOTE — Telephone Encounter (Signed)
Called, left msg on home and mobile number. Requested return call to discuss holter monitor results.

## 2016-04-02 DIAGNOSIS — D1801 Hemangioma of skin and subcutaneous tissue: Secondary | ICD-10-CM | POA: Diagnosis not present

## 2016-04-02 DIAGNOSIS — Z23 Encounter for immunization: Secondary | ICD-10-CM | POA: Diagnosis not present

## 2016-04-02 DIAGNOSIS — L821 Other seborrheic keratosis: Secondary | ICD-10-CM | POA: Diagnosis not present

## 2016-04-02 DIAGNOSIS — L814 Other melanin hyperpigmentation: Secondary | ICD-10-CM | POA: Diagnosis not present

## 2016-04-02 DIAGNOSIS — D225 Melanocytic nevi of trunk: Secondary | ICD-10-CM | POA: Diagnosis not present

## 2016-04-02 DIAGNOSIS — L570 Actinic keratosis: Secondary | ICD-10-CM | POA: Diagnosis not present

## 2016-04-03 NOTE — Telephone Encounter (Signed)
Called pt. Informed Dr. Lovena Le recommends pt to f/u in 6 months. Informed our office will send him a letter 2 months prior to that time. Informed pt will need to call our office to schedule. Pt verbalized understanding and agreed with plan.

## 2016-04-03 NOTE — Telephone Encounter (Signed)
New message  Pt is calling and still waiting for return call from 10/31  Waiting for f/u recommendations from Dr. Lovena Le  Please call back today

## 2016-04-13 ENCOUNTER — Ambulatory Visit (INDEPENDENT_AMBULATORY_CARE_PROVIDER_SITE_OTHER): Payer: Medicare Other | Admitting: Family Medicine

## 2016-04-13 ENCOUNTER — Encounter: Payer: Self-pay | Admitting: Family Medicine

## 2016-04-13 VITALS — BP 120/60 | HR 60 | Temp 98.1°F | Ht 67.0 in | Wt 186.9 lb

## 2016-04-13 DIAGNOSIS — K5732 Diverticulitis of large intestine without perforation or abscess without bleeding: Secondary | ICD-10-CM

## 2016-04-13 DIAGNOSIS — I25810 Atherosclerosis of coronary artery bypass graft(s) without angina pectoris: Secondary | ICD-10-CM

## 2016-04-13 DIAGNOSIS — R1032 Left lower quadrant pain: Secondary | ICD-10-CM | POA: Diagnosis not present

## 2016-04-13 LAB — POCT URINALYSIS DIPSTICK
BILIRUBIN UA: NEGATIVE
Blood, UA: NEGATIVE
Glucose, UA: NEGATIVE
Ketones, UA: NEGATIVE
LEUKOCYTES UA: NEGATIVE
NITRITE UA: NEGATIVE
PH UA: 6
Protein, UA: NEGATIVE
Spec Grav, UA: >=1.03
Urobilinogen, UA: 0.2

## 2016-04-13 MED ORDER — AMOXICILLIN-POT CLAVULANATE 875-125 MG PO TABS
1.0000 | ORAL_TABLET | Freq: Two times a day (BID) | ORAL | 0 refills | Status: DC
Start: 2016-04-13 — End: 2016-05-27

## 2016-04-13 NOTE — Progress Notes (Signed)
HPI:  Omar Benson is a pleasant 81 year old with an extensive past medical history listed below, here for an acute visit for abdominal pain: -Started about 3-4 days ago -Pain is just below the umbilicus and the left lower abdomen -Pain is mild to moderate, fairly constant, nagging - he reports it feels identical to his prior episodes of diverticulitis -Denies: Fevers, malaise, nausea, vomiting, chest pain, shortness of breath, diarrhea, constipation, melena, hematochezia, dysuria or any other symptoms -He endorses mild urinary frequency -He reports he has had this for 5 times, and has always been treated with an antibiotic with resolution of his symptoms -He does not wish for further evaluation with labs or CT scan - reports he cannot do contrast given his kidney function and a dye allergy -He was given Augmentin for this the last time it occurred, and he reports his symptoms completely resolved  ROS: See pertinent positives and negatives per HPI.  Past Medical History:  Diagnosis Date  . AAA 09/19/2008  . Allergy   . ANEMIA-IRON DEFICIENCY 09/19/2008  . ANXIETY 06/13/2010  . Arthritis   . Atrial fibrillation ([place]) 04/19/2008   amiodarone rx;  Echocardiogram 05/26/11: No wall motion abnormalities, mild LVH, EF 60%.  . Atrial flutter ([place]) 12/05/2008   s/p RFCA  . Benign esophageal stricture 09/2075   dilated during EGD  . BRADYCARDIA 12/05/2008  . CAD 12/05/2008   s/p CABG; NSTEMI 05/2011 - LHC 05/25/11: LAD occluded, LIMA-LAD patent, ostial circumflex 50%, AV circumflex 70%, SVG-OM2 with extensive disease with thrombus (culprit vessel), RCA 80% and occluded, SVG-intermediate patent, SVG-PDA/PL A patent.  Given that the culprit vessel was a subtotally occluded heavily thrombotic graft to a smaller OM2, medical therapy was recommended.;   . CKD (chronic kidney disease) 09/19/2008   Qualifier: Diagnosis of  By: Jenny Reichmann MD, [place], HX OF 09/19/2008   adenomatous polyps 07/2010  . COPD  (chronic obstructive pulmonary disease) ([place])   . CORONARY ARTERY BYPASS GRAFT, HX OF 12/05/2008   A. LIMA-LAD, VG-RI, VG-OM2, VG-RPD/RPL;  B. 05/2011 - NSTEMI - CATH WITH 4/5 PATENT GRAFTS AND NEW THROMBUS IN DISTAL VG-OM2 - MED RX  . DIVERTICULOSIS, COLON 09/19/2008  . Erectile dysfunction 09/13/2012  . GASTROINTESTINAL HEMORRHAGE, HX OF 04/19/2008  . GERD 09/19/2008  . GOUT 09/19/2008  . HYPERLIPIDEMIA 09/19/2008  . HYPERTENSION 09/19/2008  . Hypothyroidism 12/05/2008  . LOW BACK PAIN 09/19/2008  . Myocardial infarction   . NEPHROLITHIASIS, HX OF 01/11/2009  . PERIPHERAL VASCULAR DISEASE 09/19/2008  . PSA, INCREASED 09/19/2008  . RENAL INSUFFICIENCY 09/19/2008  . Unstable angina pectoris ([place]) 05/2015   Cath 02/02 w/ patent LIMA-LAD, SVG-D1, SVG-RCA, severe dz in SVG-OM, med rx    Past Surgical History:  Procedure Laterality Date  . CARDIAC CATHETERIZATION  05/25/2011  . CARDIAC CATHETERIZATION N/A 06/20/2015   Procedure: Coronary/Graft Angiography;  Surgeon: Peter M Martinique, MD; LAD, CFX & RCA 100%; LIMA-LAD, SVG-D1 & SVG-RCA OK; SVG-OM severe dz, med rx   . COLON SURGERY    . CORONARY ARTERY BYPASS GRAFT  2004 X 5  . ENTEROSCOPY N/A 02/13/2014   Procedure: ENTEROSCOPY;  Surgeon: [name], MD;  Location: [facility] ENDOSCOPY;  Service: Gastroenterology;  Laterality: N/A;  super slim  . ESOPHAGOGASTRODUODENOSCOPY  04/28/2012   Procedure: ESOPHAGOGASTRODUODENOSCOPY (EGD);  Surgeon: [name], MD;  Location: [facility];  Service: Endoscopy;  Laterality: N/A;  . LEFT HEART CATHETERIZATION WITH CORONARY/GRAFT ANGIOGRAM  05/25/2011   Procedure: LEFT HEART CATHETERIZATION  WITH [name];  Surgeon: [name], MD;  Location: [facility] CATH LAB;  Service: Cardiovascular;;  . s/p afib ablation  2004    Family History  Problem Relation Age of Onset  . Multiple myeloma Mother   . Cancer Mother   . Other Mother     varicose veins  . Diabetes Father   . Diabetes Sister   . Heart disease Paternal  Uncle   . Heart disease Maternal Uncle   . Lymphoma Sister     half-sister  . Heart attack Maternal Uncle   . Heart attack Paternal Grandmother   . Heart attack Paternal Grandfather   . Hypertension Neg Hx   . Stroke Neg Hx     Social History   Social History  . Marital status: Married    Spouse name: [name]  . Number of children: N/A  . Years of education: N/A   Occupational History  . 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 Smoking status: Former Smoker    Packs/day: 4.00    Years: 31.00    Types: Cigarettes    Quit date: 03/19/1983  . Smokeless tobacco: Never Used  . Alcohol use 6.0 oz/week    10 Shots of liquor per week  . Drug use: No  . Sexual activity: Yes   Other Topics Concern  . None   Social History Narrative   Married lives with his wife     Current Outpatient Prescriptions:  .  albuterol (PROVENTIL HFA;VENTOLIN HFA) 108 (90 BASE) MCG/ACT inhaler, Inhale 2 puffs into the lungs every 6 (six) hours as needed for wheezing or shortness of breath. Reported on 08/09/2015, Disp: , Rfl:  .  allopurinol (ZYLOPRIM) 300 MG tablet, Take 1 tablet (300 mg total) by mouth daily., Disp: 30 tablet, Rfl: 0 .  amiodarone (PACERONE) 200 MG tablet, Take 0.5 tablets (100 mg total) by mouth daily., Disp: 45 tablet, Rfl: 3 .  aspirin 81 MG tablet, Take 162 mg by mouth daily., Disp: , Rfl:  .  b complex vitamins tablet, Take 1 tablet by mouth daily., Disp: , Rfl:  .  calcium elemental as carbonate (TUMS ULTRA 1000) 400 MG tablet, Chew 1,000 mg by mouth daily as needed for heartburn., Disp: , Rfl:  .  Cholecalciferol (VITAMIN D) 2000 UNITS CAPS, Take 2,000 Units by mouth daily. , Disp: , Rfl:  .  EPINEPHrine (EPIPEN) 0.3 mg/0.3 mL SOAJ injection, Inject 0.3 mLs (0.3 mg total) into the muscle once., Disp: 2 Device, Rfl: 2 .  hydrocortisone cream 0.5 %, Apply 1 application topically daily. Reported on 06/27/2015, Disp: , Rfl:  .  levothyroxine  (SYNTHROID, LEVOTHROID) 125 MCG tablet, Take 1 tablet (125 mcg total) by mouth daily., Disp: 90 tablet, Rfl: 1 .  MAGNESIUM PO, Take 1 tablet by mouth daily. , Disp: , Rfl:  .  Multiple Vitamin (MULTIVITAMIN) tablet, Take 1 tablet by mouth at bedtime. , Disp: , Rfl:  .  nitroGLYCERIN (NITROSTAT) 0.4 MG SL tablet, PLACE 1 TABLET UNDER THE TONGUE EVERY 5 MINUTES AS NEEDED FOR CHEST PAIN FOR UP TO 3 DOSES., Disp: 25 tablet, Rfl: 5 .  pantoprazole (PROTONIX) 40 MG tablet, Take 1 tablet (40 mg total) by mouth daily., Disp: 90 tablet, Rfl: 1 .  tiotropium (SPIRIVA HANDIHALER) 18 MCG inhalation capsule, PLACE 1 CAPSULE (18 MCG TOTAL) INTO INHALER AND INHALE DAILY., Disp: 90 capsule, Rfl: 3 .  amoxicillin-clavulanate (AUGMENTIN) 875-125 MG tablet, Take 1 tablet  by mouth 2 (two) times daily., Disp: 14 tablet, Rfl: 0  EXAM:  Vitals:   04/13/16 1401  BP: 120/60  Pulse: 60  Temp: 98.1 F (36.7 C)    Body mass index is 29.27 kg/m.  GENERAL: vitals reviewed and listed above, alert, oriented, appears well hydrated and in no acute distress  HEENT: atraumatic, conjunttiva clear, no obvious abnormalities on inspection of external nose and ears  NECK: no obvious masses on inspection  LUNGS: clear to auscultation bilaterally, no wheezes, rales or rhonchi, good air movement  CV: HRRR, no peripheral edema  ABD: BS+, soft, LLQ TTP w/out rebound or guarding  MS: moves all extremities without noticeable abnormality  PSYCH: pleasant and cooperative, no obvious depression or anxiety  ASSESSMENT AND PLAN:  Discussed the following assessment and plan:  Left lower quadrant pain  Diverticulitis of colon  -we discussed possible serious and likely etiologies, workup and treatment, treatment risks and return precautions - he is convinced this is diverticulitis -after this discussion, Jeovani opted for empiric tx diverticulitis at home -did convince him to do urine check, though abx should cover uti as  well -follow up advised with PCP in 5-7 days to ensure resolution -of course, we advised [place]  to return or notify a doctor immediately if symptoms worsen or persist or new concerns arise.   Patient Instructions  Schedule follow up with your doctor in 1 week.  Start the antibiotic.  Liquid diet for 1 day then slowly advance as tolerated.  Seek care immediately if worsening, new symptoms or not improving on treatment.   Diverticulitis Diverticulitis is when small pockets that have formed in your colon (large intestine) become infected or swollen. Follow these instructions at home:  Follow your doctor's instructions.  Follow a special diet if told by your doctor.  When you feel better, your doctor may tell you to change your diet. You may be told to eat a lot of fiber. Fruits and vegetables are good sources of fiber. Fiber makes it easier to poop (have bowel movements).  Take supplements or probiotics as told by your doctor.  Only take medicines as told by your doctor.  Keep all follow-up visits with your doctor. Contact a doctor if:  Your pain does not get better.  You have a hard time eating food.  You are not pooping like normal. Get help right away if:  Your pain gets worse.  Your problems do not get better.  Your problems suddenly get worse.  You have a fever.  You keep throwing up (vomiting).  You have bloody or black, tarry poop (stool). This information is not intended to replace advice given to you by your health care provider. Make sure you discuss any questions you have with your health care provider. Document Released: 10/21/2007 Document Revised: 10/10/2015 Document Reviewed: 03/29/2013 Elsevier Interactive Patient Education  2017 [place]., DO

## 2016-04-13 NOTE — Addendum Note (Signed)
Addended by: Agnes Lawrence on: 04/13/2016 02:40 PM   Modules accepted: Orders

## 2016-04-13 NOTE — Patient Instructions (Signed)
Schedule follow up with your doctor in 1 week.  Start the antibiotic.  Liquid diet for 1 day then slowly advance as tolerated.  Seek care immediately if worsening, new symptoms or not improving on treatment.   Diverticulitis Diverticulitis is when small pockets that have formed in your colon (large intestine) become infected or swollen. Follow these instructions at home:  Follow your doctor's instructions.  Follow a special diet if told by your doctor.  When you feel better, your doctor may tell you to change your diet. You may be told to eat a lot of fiber. Fruits and vegetables are good sources of fiber. Fiber makes it easier to poop (have bowel movements).  Take supplements or probiotics as told by your doctor.  Only take medicines as told by your doctor.  Keep all follow-up visits with your doctor. Contact a doctor if:  Your pain does not get better.  You have a hard time eating food.  You are not pooping like normal. Get help right away if:  Your pain gets worse.  Your problems do not get better.  Your problems suddenly get worse.  You have a fever.  You keep throwing up (vomiting).  You have bloody or black, tarry poop (stool). This information is not intended to replace advice given to you by your health care provider. Make sure you discuss any questions you have with your health care provider. Document Released: 10/21/2007 Document Revised: 10/10/2015 Document Reviewed: 03/29/2013 Elsevier Interactive Patient Education  2017 [employer].

## 2016-04-13 NOTE — Progress Notes (Signed)
Pre visit review using our clinic review tool, if applicable. No additional management support is needed unless otherwise documented below in the visit note. 

## 2016-05-15 ENCOUNTER — Other Ambulatory Visit: Payer: Self-pay | Admitting: Internal Medicine

## 2016-05-22 ENCOUNTER — Other Ambulatory Visit: Payer: Self-pay | Admitting: Internal Medicine

## 2016-05-25 NOTE — Progress Notes (Addendum)
Subjective:   Omar Benson is a 82 y.o. male who presents for an Initial Medicare Annual Wellness Visit, accompanied by wife, Omar Benson.   The Patient was informed that the wellness visit is to identify future health risk and educate and initiate measures that can reduce risk for increased disease through the lifespan.   Review of Systems  No ROS.  Medicare Wellness Visit. Cardiac Risk Factors include: advanced age (>31mn, >>63women);dyslipidemia;family history of premature cardiovascular disease;hypertension;male gender   Sleep patterns: Sleeps about 6 hours nightly.   Home Safety/Smoke Alarms: Smoke and CO2 detectors in place.   Living environment; residence and Firearm Safety: Lives with wife and 5 cats in 2 story home, rarely accesses upstairs. Firearms locked away.  Seat Belt Safety/Bike Helmet: Wears seat belt.   Counseling:   Eye Exam-Last [facility] 11/2014[facility]  Male:   CCS- Last 07/21/10:Done per external report. Recall 5 years. Order placed.  PSA- Lab Results  Component Value Date   PSA 1.76 12/26/2013   PSA 2.15 06/27/2013   PSA 1.26 12/22/2012        Objective:    Today's Vitals   05/27/16 1512  BP: 138/64  Pulse: (!) 56  Resp: 18  SpO2: 96%  Weight: 186 lb 1.9 oz (84.4 kg)  Height: '5\' 7"'$  (1.702 m)   Body mass index is 29.15 kg/m.  Current Medications (verified) Outpatient Encounter Prescriptions as of 05/27/2016  Medication Sig  . albuterol (PROVENTIL HFA;VENTOLIN HFA) 108 (90 BASE) MCG/ACT inhaler Inhale 2 puffs into the lungs every 6 (six) hours as needed for wheezing or shortness of breath. Reported on 08/09/2015  . allopurinol (ZYLOPRIM) 300 MG tablet Take 1 tablet (300 mg total) by mouth daily.  .Marland Kitchenamiodarone (PACERONE) 200 MG tablet Take 0.5 tablets (100 mg total) by mouth daily.  .Marland Kitchenaspirin 81 MG tablet Take 162 mg by mouth daily.  .Marland Kitchenb complex vitamins tablet Take 1 tablet by mouth daily.    . calcium elemental as carbonate (TUMS ULTRA 1000) 400 MG tablet Chew 1,000 mg by mouth daily as needed for heartburn.  . Cholecalciferol (VITAMIN D) 2000 UNITS CAPS Take 2,000 Units by mouth daily.   .Marland KitchenEPINEPHrine (EPIPEN) 0.3 mg/0.3 mL SOAJ injection Inject 0.3 mLs (0.3 mg total) into the muscle once.  . hydrocortisone cream 0.5 % Apply 1 application topically daily. Reported on 06/27/2015  . levothyroxine (SYNTHROID, LEVOTHROID) 125 MCG tablet take 1 tablet by mouth once daily  . MAGNESIUM PO Take 1 tablet by mouth daily.   . Multiple Vitamin (MULTIVITAMIN) tablet Take 1 tablet by mouth at bedtime.   . nitroGLYCERIN (NITROSTAT) 0.4 MG SL tablet PLACE 1 TABLET UNDER THE TONGUE EVERY 5 MINUTES AS NEEDED FOR CHEST PAIN FOR UP TO 3 DOSES.  .Marland Kitchenpantoprazole (PROTONIX) 40 MG tablet Take 1 tablet (40 mg total) by mouth daily. Yearly physical due in February must see MD for refills  . tiotropium (SPIRIVA HANDIHALER) 18 MCG inhalation capsule PLACE 1 CAPSULE (18 MCG TOTAL) INTO INHALER AND INHALE DAILY.  . vitamin C (ASCORBIC ACID) 500 MG tablet Take 500 mg by mouth daily.  . [DISCONTINUED] amoxicillin-clavulanate (AUGMENTIN) 875-125 MG tablet Take 1 tablet by mouth 2 (two) times daily.  . [DISCONTINUED] pantoprazole (PROTONIX) 40 MG tablet Take 1 tablet (40 mg total) by mouth daily.   No facility-administered encounter medications on file as of 05/27/2016.     Allergies (verified) Bee  venom; Promethazine hcl; Doxycycline hyclate; Lipitor [atorvastatin calcium]; Other; Tetracycline; Crestor [rosuvastatin]; and Tetanus toxoid   History: Past Medical History:  Diagnosis Date  . AAA 09/19/2008  . Allergy   . ANEMIA-IRON DEFICIENCY 09/19/2008  . ANXIETY 06/13/2010  . Arthritis   . Atrial fibrillation ([place]) 04/19/2008   amiodarone rx;  Echocardiogram 05/26/11: No wall motion abnormalities, mild LVH, EF 60%.  . Atrial flutter ([place]) 12/05/2008   s/p RFCA  . Benign esophageal stricture 09/2075   dilated during  EGD  . BRADYCARDIA 12/05/2008  . CAD 12/05/2008   s/p CABG; NSTEMI 05/2011 - LHC 05/25/11: LAD occluded, LIMA-LAD patent, ostial circumflex 50%, AV circumflex 70%, SVG-OM2 with extensive disease with thrombus (culprit vessel), RCA 80% and occluded, SVG-intermediate patent, SVG-PDA/PL A patent.  Given that the culprit vessel was a subtotally occluded heavily thrombotic graft to a smaller OM2, medical therapy was recommended.;   . CKD (chronic kidney disease) 09/19/2008   Qualifier: Diagnosis of  By: Jenny Reichmann MD, [place], HX OF 09/19/2008   adenomatous polyps 07/2010  . COPD (chronic obstructive pulmonary disease) ([place])   . CORONARY ARTERY BYPASS GRAFT, HX OF 12/05/2008   A. LIMA-LAD, VG-RI, VG-OM2, VG-RPD/RPL;  B. 05/2011 - NSTEMI - CATH WITH 4/5 PATENT GRAFTS AND NEW THROMBUS IN DISTAL VG-OM2 - MED RX  . DIVERTICULOSIS, COLON 09/19/2008  . Erectile dysfunction 09/13/2012  . GASTROINTESTINAL HEMORRHAGE, HX OF 04/19/2008  . GERD 09/19/2008  . GOUT 09/19/2008  . HYPERLIPIDEMIA 09/19/2008  . HYPERTENSION 09/19/2008  . Hypothyroidism 12/05/2008  . LOW BACK PAIN 09/19/2008  . Myocardial infarction   . NEPHROLITHIASIS, HX OF 01/11/2009  . PERIPHERAL VASCULAR DISEASE 09/19/2008  . PSA, INCREASED 09/19/2008  . RENAL INSUFFICIENCY 09/19/2008  . Unstable angina pectoris ([place]) 05/2015   Cath 02/02 w/ patent LIMA-LAD, SVG-D1, SVG-RCA, severe dz in SVG-OM, med rx   Past Surgical History:  Procedure Laterality Date  . CARDIAC CATHETERIZATION  05/25/2011  . CARDIAC CATHETERIZATION N/A 06/20/2015   Procedure: Coronary/Graft Angiography;  Surgeon: Peter M Martinique, MD; LAD, CFX & RCA 100%; LIMA-LAD, SVG-D1 & SVG-RCA OK; SVG-OM severe dz, med rx   . COLON SURGERY    . CORONARY ARTERY BYPASS GRAFT  2004 X 5  . ENTEROSCOPY N/A 02/13/2014   Procedure: ENTEROSCOPY;  Surgeon: [name], MD;  Location: [facility] ENDOSCOPY;  Service: Gastroenterology;  Laterality: N/A;  super slim  . ESOPHAGOGASTRODUODENOSCOPY  04/28/2012    Procedure: ESOPHAGOGASTRODUODENOSCOPY (EGD);  Surgeon: [name], MD;  Location: [facility];  Service: Endoscopy;  Laterality: N/A;  . LEFT HEART CATHETERIZATION WITH CORONARY/GRAFT ANGIOGRAM  05/25/2011   Procedure: LEFT HEART CATHETERIZATION WITH [name];  Surgeon: [name], MD;  Location[facility] CATH LAB;  Service: Cardiovascular;;  . s/p afib ablation  2004   Family History  Problem Relation Age of Onset  . Multiple myeloma Mother   . Cancer Mother   . Other Mother     varicose veins  . Diabetes Father   . Diabetes Sister   . Heart disease Paternal Uncle   . Heart disease Maternal Uncle   . Lymphoma Sister     half-sister  . Heart attack Maternal Uncle   . Heart attack Paternal Grandmother   . Heart attack Paternal Grandfather   . Hypertension Neg Hx   . Stroke Neg Hx    Social History   Occupational History  . 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  Smoking status: Former Smoker    Packs/day: 4.00    Years: 31.00    Types: Cigarettes    Quit date: 03/19/1983  . Smokeless tobacco: Never Used  . Alcohol use 6.0 oz/week    10 Shots of liquor per week  . Drug use: No  . Sexual activity: Yes   Tobacco Counseling Counseling given: No   Activities of Daily Living In your present state of health, do you have any difficulty performing the following activities: 05/27/2016 06/19/2015  Hearing? N N  Vision? N N  Difficulty concentrating or making decisions? N N  Walking or climbing stairs? N N  Dressing or bathing? N N  Doing errands, shopping? N N  Preparing Food and eating ? N -  Using the Toilet? N -  In the past six months, have you accidently leaked urine? N -  Do you have problems with loss of bowel control? N -  Managing your Medications? N -  Managing your Finances? N -  Housekeeping or managing your Housekeeping? N -  Some recent data might be hidden    Immunizations and Health Maintenance There  is no immunization history for the selected administration types on file for this patient. Health Maintenance Due  Topic Date Due  . TETANUS/TDAP  09/14/1955  . ZOSTAVAX  09/13/1996  . PNA vac Low Risk Adult (1 of 2 - PCV13) 09/13/2001  . COLONOSCOPY  07/20/2013  . INFLUENZA VACCINE  12/17/2015    Patient Care Team: Biagio Borg, MD as PCP - General Evans Lance, MD (Cardiology) Carolan Clines, MD as Consulting Physician (Urology) Asher (Dentistry)  Indicate any recent Medical Services you may have received from other than [facility] providers in the past year (date may be approximate).    Assessment:   This is a routine wellness examination for [place]. Physical assessment deferred to PCP.   Hearing/Vision screen Hearing Screening Comments: C/o tinnitus. Reports hearing loss in left ear.  Vision Screening Comments: Wears glasses.   Dietary issues and exercise activities discussed: Current Exercise Habits: Home exercise routine (stays active throughout the day, repairs,), Exercise limited by: cardiac condition(s)   Diet (meal preparation, eat out, water intake, caffeinated beverages, dairy products, fruits and vegetables): Drinks boost, decaf pepsi, water and "shooters".   Breakfast: oatmeal, pecan roll Lunch: meat and vegetables Dinner: soup, sandwich  Discussed heart healthy diet and increasing water. Encouraged to remain active.    Goals      Patient Stated   . <enter goal here> (pt-stated)          Maintain current health and activity level.       Depression Screen PHQ 2/9 Scores 05/27/2016 08/27/2015 02/11/2015 12/26/2013  PHQ - 2 Score 0 0 0 0    Fall Risk Fall Risk  05/27/2016 08/27/2015 12/26/2013 06/27/2013 12/22/2012  Falls in the past year? No No No No No    Cognitive Function:       Ad8 score reviewed for issues:  Issues making decisions:no  Less interest in hobbies / activities:no  Repeats questions, stories (family complaining):no  Trouble using  ordinary gadgets (microwave, computer, phone):no  Forgets the month or year: no  Mismanaging finances: no  Remembering appts:no  Daily problems with thinking and/or memory:no Ad8 score is=0     Screening Tests Health Maintenance  Topic Date Due  . TETANUS/TDAP  09[birth year]14/1955  . ZOSTAVAX  09/13/1996  . PNA vac Low Risk Adult (1 of 2 - PCV13) 09/13/2001  . COLONOSCOPY  07/20/2013  . INFLUENZA VACCINE  12/17/2015        Plan:     Eat heart healthy diet (full of fruits, vegetables, whole grains, lean protein, water--limit salt, fat, and sugar intake) and increase physical activity as tolerated.  Continue doing brain stimulating activities (puzzles, reading, adult coloring books, staying active) to keep memory sharp.   Bring a copy of your advance directives to your next office visit.  Schedule colonoscopy.   **Pt requesting referral for new pulmonologist, appt with PCP scheduled for 06/24/16 for yearly follow up (fasting)**   During the course of the visit Kodi was educated and counseled about the following appropriate screening and preventive services:   Vaccines to include Pneumoccal, Influenza, Hepatitis B, Td, Zostavax, HCV  Colorectal cancer screening  Cardiovascular disease screening  Diabetes screening  Glaucoma screening  Nutrition counseling  Prostate cancer screening   Patient Instructions (the written plan) were given to the patient.   Gerilyn Nestle, RN   05/27/2016    Medical screening examination/treatment/procedure(s) were performed by non-physician practitioner and as supervising physician I was immediately available for consultation/collaboration. I agree with above. [name], MD

## 2016-05-25 NOTE — Progress Notes (Signed)
Pre visit review using our clinic review tool, if applicable. No additional management support is needed unless otherwise documented below in the visit note. 

## 2016-05-27 ENCOUNTER — Ambulatory Visit (INDEPENDENT_AMBULATORY_CARE_PROVIDER_SITE_OTHER): Payer: Medicare Other

## 2016-05-27 VITALS — BP 138/64 | HR 56 | Resp 18 | Ht 67.0 in | Wt 186.1 lb

## 2016-05-27 DIAGNOSIS — Z0000 Encounter for general adult medical examination without abnormal findings: Secondary | ICD-10-CM

## 2016-05-27 DIAGNOSIS — Z1211 Encounter for screening for malignant neoplasm of colon: Secondary | ICD-10-CM

## 2016-05-27 NOTE — Patient Instructions (Addendum)
Eat heart healthy diet (full of fruits, vegetables, whole grains, lean protein, water--limit salt, fat, and sugar intake) and increase physical activity as tolerated.  Continue doing brain stimulating activities (puzzles, reading, adult coloring books, staying active) to keep memory sharp.   Bring a copy of your advance directives to your next office visit.  Schedule colonoscopy.      Fall Prevention in the Home Introduction Falls can cause injuries. They can happen to people of all ages. There are many things you can do to make your home safe and to help prevent falls. What can I do on the outside of my home?  Regularly fix the edges of walkways and driveways and fix any cracks.  Remove anything that might make you trip as you walk through a door, such as a raised step or threshold.  Trim any bushes or trees on the path to your home.  Use bright outdoor lighting.  Clear any walking paths of anything that might make someone trip, such as rocks or tools.  Regularly check to see if handrails are loose or broken. Make sure that both sides of any steps have handrails.  Any raised decks and porches should have guardrails on the edges.  Have any leaves, snow, or ice cleared regularly.  Use sand or salt on walking paths during winter.  Clean up any spills in your garage right away. This includes oil or grease spills. What can I do in the bathroom?  Use night lights.  Install grab bars by the toilet and in the tub and shower. Do not use towel bars as grab bars.  Use non-skid mats or decals in the tub or shower.  If you need to sit down in the shower, use a plastic, non-slip stool.  Keep the floor dry. Clean up any water that spills on the floor as soon as it happens.  Remove soap buildup in the tub or shower regularly.  Attach bath mats securely with double-sided non-slip rug tape.  Do not have throw rugs and other things on the floor that can make you trip. What can I do  in the bedroom?  Use night lights.  Make sure that you have a light by your bed that is easy to reach.  Do not use any sheets or blankets that are too big for your bed. They should not hang down onto the floor.  Have a firm chair that has side arms. You can use this for support while you get dressed.  Do not have throw rugs and other things on the floor that can make you trip. What can I do in the kitchen?  Clean up any spills right away.  Avoid walking on wet floors.  Keep items that you use a lot in easy-to-reach places.  If you need to reach something above you, use a strong step stool that has a grab bar.  Keep electrical cords out of the way.  Do not use floor polish or wax that makes floors slippery. If you must use wax, use non-skid floor wax.  Do not have throw rugs and other things on the floor that can make you trip. What can I do with my stairs?  Do not leave any items on the stairs.  Make sure that there are handrails on both sides of the stairs and use them. Fix handrails that are broken or loose. Make sure that handrails are as long as the stairways.  Check any carpeting to make sure that it is  firmly attached to the stairs. Fix any carpet that is loose or worn.  Avoid having throw rugs at the top or bottom of the stairs. If you do have throw rugs, attach them to the floor with carpet tape.  Make sure that you have a light switch at the top of the stairs and the bottom of the stairs. If you do not have them, ask someone to add them for you. What else can I do to help prevent falls?  Wear shoes that:  Do not have high heels.  Have rubber bottoms.  Are comfortable and fit you well.  Are closed at the toe. Do not wear sandals.  If you use a stepladder:  Make sure that it is fully opened. Do not climb a closed stepladder.  Make sure that both sides of the stepladder are locked into place.  Ask someone to hold it for you, if possible.  Clearly mark  and make sure that you can see:  Any grab bars or handrails.  First and last steps.  Where the edge of each step is.  Use tools that help you move around (mobility aids) if they are needed. These include:  Canes.  Walkers.  Scooters.  Crutches.  Turn on the lights when you go into a dark area. Replace any light bulbs as soon as they burn out.  Set up your furniture so you have a clear path. Avoid moving your furniture around.  If any of your floors are uneven, fix them.  If there are any pets around you, be aware of where they are.  Review your medicines with your doctor. Some medicines can make you feel dizzy. This can increase your chance of falling. Ask your doctor what other things that you can do to help prevent falls. This information is not intended to replace advice given to you by your health care provider. Make sure you discuss any questions you have with your health care provider. Document Released: 02/28/2009 Document Revised: 10/10/2015 Document Reviewed: 06/08/2014  2017 Elsevier  Health Maintenance, Male A healthy lifestyle and preventative care can promote health and wellness.  Maintain regular health, dental, and eye exams.  Eat a healthy diet. Foods like vegetables, fruits, whole grains, low-fat dairy products, and lean protein foods contain the nutrients you need and are low in calories. Decrease your intake of foods high in solid fats, added sugars, and salt. Get information about a proper diet from your health care provider, if necessary.  Regular physical exercise is one of the most important things you can do for your health. Most adults should get at least 150 minutes of moderate-intensity exercise (any activity that increases your heart rate and causes you to sweat) each week. In addition, most adults need muscle-strengthening exercises on 2 or more days a week.   Maintain a healthy weight. The body mass index (BMI) is a screening tool to identify  possible weight problems. It provides an estimate of body fat based on height and weight. Your health care provider can find your BMI and can help you achieve or maintain a healthy weight. For males 20 years and older:  A BMI below 18.5 is considered underweight.  A BMI of 18.5 to 24.9 is normal.  A BMI of 25 to 29.9 is considered overweight.  A BMI of 30 and above is considered obese.  Maintain normal blood lipids and cholesterol by exercising and minimizing your intake of saturated fat. Eat a balanced diet with plenty of fruits  and vegetables. Blood tests for lipids and cholesterol should begin at age 37 and be repeated every 5 years. If your lipid or cholesterol levels are high, you are over age 49, or you are at high risk for heart disease, you may need your cholesterol levels checked more frequently.Ongoing high lipid and cholesterol levels should be treated with medicines if diet and exercise are not working.  If you smoke, find out from your health care provider how to quit. If you do not use tobacco, do not start.  Lung cancer screening is recommended for adults aged 42-80 years who are at high risk for developing lung cancer because of a history of smoking. A yearly low-dose CT scan of the lungs is recommended for people who have at least a 30-pack-year history of smoking and are current smokers or have quit within the past 15 years. A pack year of smoking is smoking an average of 1 pack of cigarettes a day for 1 year (for example, a 30-pack-year history of smoking could mean smoking 1 pack a day for 30 years or 2 packs a day for 15 years). Yearly screening should continue until the smoker has stopped smoking for at least 15 years. Yearly screening should be stopped for people who develop a health problem that would prevent them from having lung cancer treatment.  If you choose to drink alcohol, do not have more than 2 drinks per day. One drink is considered to be 12 oz (360 mL) of beer, 5  oz (150 mL) of wine, or 1.5 oz (45 mL) of liquor.  Avoid the use of street drugs. Do not share needles with anyone. Ask for help if you need support or instructions about stopping the use of drugs.  High blood pressure causes heart disease and increases the risk of stroke. High blood pressure is more likely to develop in:  People who have blood pressure in the end of the normal range (100-139/85-89 mm Hg).  People who are overweight or obese.  People who are African American.  If you are 65-3 years of age, have your blood pressure checked every 3-5 years. If you are 54 years of age or older, have your blood pressure checked every year. You should have your blood pressure measured twice-once when you are at a hospital or clinic, and once when you are not at a hospital or clinic. Record the average of the two measurements. To check your blood pressure when you are not at a hospital or clinic, you can use:  An automated blood pressure machine at a pharmacy.  A home blood pressure monitor.  If you are 69-32 years old, ask your health care provider if you should take aspirin to prevent heart disease.  Diabetes screening involves taking a blood sample to check your fasting blood sugar level. This should be done once every 3 years after age 75 if you are at a normal weight and without risk factors for diabetes. Testing should be considered at a younger age or be carried out more frequently if you are overweight and have at least 1 risk factor for diabetes.  Colorectal cancer can be detected and often prevented. Most routine colorectal cancer screening begins at the age of 73 and continues through age 70. However, your health care provider may recommend screening at an earlier age if you have risk factors for colon cancer. On a yearly basis, your health care provider may provide home test kits to check for hidden blood in the  stool. A small camera at the end of a tube may be used to directly examine  the colon (sigmoidoscopy or colonoscopy) to detect the earliest forms of colorectal cancer. Talk to your health care provider about this at age 37 when routine screening begins. A direct exam of the colon should be repeated every 5-10 years through age 27, unless early forms of precancerous polyps or small growths are found.  People who are at an increased risk for hepatitis B should be screened for this virus. You are considered at high risk for hepatitis B if:  You were born in a country where hepatitis B occurs often. Talk with your health care provider about which countries are considered high risk.  Your parents were born in a high-risk country and you have not received a shot to protect against hepatitis B (hepatitis B vaccine).  You have HIV or AIDS.  You use needles to inject street drugs.  You live with, or have sex with, someone who has hepatitis B.  You are a man who has sex with other men (MSM).  You get hemodialysis treatment.  You take certain medicines for conditions like cancer, organ transplantation, and autoimmune conditions.  Hepatitis C blood testing is recommended for all people born from [birth year] through 1963 and any individual with known risk factors for hepatitis C.  Healthy men should no longer receive prostate-specific antigen (PSA) blood tests as part of routine cancer screening. Talk to your health care provider about prostate cancer screening.  Testicular cancer screening is not recommended for adolescents or adult males who have no symptoms. Screening includes self-exam, a health care provider exam, and other screening tests. Consult with your health care provider about any symptoms you have or any concerns you have about testicular cancer.  Practice safe sex. Use condoms and avoid high-risk sexual practices to reduce the spread of sexually transmitted infections (STIs).  You should be screened for STIs, including gonorrhea and chlamydia if:  You are sexually  active and are younger than 24 years.  You are older than 24 years, and your health care provider tells you that you are at risk for this type of infection.  Your sexual activity has changed since you were last screened, and you are at an increased risk for chlamydia or gonorrhea. Ask your health care provider if you are at risk.  If you are at risk of being infected with HIV, it is recommended that you take a prescription medicine daily to prevent HIV infection. This is called pre-exposure prophylaxis (PrEP). You are considered at risk if:  You are a man who has sex with other men (MSM).  You are a heterosexual man who is sexually active with multiple partners.  You take drugs by injection.  You are sexually active with a partner who has HIV.  Talk with your health care provider about whether you are at high risk of being infected with HIV. If you choose to begin PrEP, you should first be tested for HIV. You should then be tested every 3 months for as long as you are taking PrEP.  Use sunscreen. Apply sunscreen liberally and repeatedly throughout the day. You should seek shade when your shadow is shorter than you. Protect yourself by wearing long sleeves, pants, a wide-brimmed hat, and sunglasses year round whenever you are outdoors.  Tell your health care provider of new moles or changes in moles, especially if there is a change in shape or color. Also, tell your health care  provider if a mole is larger than the size of a pencil eraser.  A one-time screening for abdominal aortic aneurysm (AAA) and surgical repair of large AAAs by ultrasound is recommended for men aged 17-75 years who are current or former smokers.  Stay current with your vaccines (immunizations). This information is not intended to replace advice given to you by your health care provider. Make sure you discuss any questions you have with your health care provider. Document Released: 10/31/2007 Document Revised: 05/25/2014  Document Reviewed: 02/05/2015 Elsevier Interactive Patient Education  2017 [employer].

## 2016-05-28 ENCOUNTER — Encounter: Payer: Self-pay | Admitting: Internal Medicine

## 2016-06-18 ENCOUNTER — Encounter: Payer: Self-pay | Admitting: Internal Medicine

## 2016-06-18 ENCOUNTER — Other Ambulatory Visit (INDEPENDENT_AMBULATORY_CARE_PROVIDER_SITE_OTHER): Payer: Medicare Other

## 2016-06-18 ENCOUNTER — Ambulatory Visit (INDEPENDENT_AMBULATORY_CARE_PROVIDER_SITE_OTHER): Payer: Medicare Other | Admitting: Internal Medicine

## 2016-06-18 VITALS — BP 138/80 | HR 51 | Temp 98.2°F | Resp 20 | Wt 187.0 lb

## 2016-06-18 DIAGNOSIS — E785 Hyperlipidemia, unspecified: Secondary | ICD-10-CM

## 2016-06-18 DIAGNOSIS — I10 Essential (primary) hypertension: Secondary | ICD-10-CM

## 2016-06-18 DIAGNOSIS — I714 Abdominal aortic aneurysm, without rupture, unspecified: Secondary | ICD-10-CM

## 2016-06-18 DIAGNOSIS — I480 Paroxysmal atrial fibrillation: Secondary | ICD-10-CM

## 2016-06-18 DIAGNOSIS — J438 Other emphysema: Secondary | ICD-10-CM

## 2016-06-18 DIAGNOSIS — N320 Bladder-neck obstruction: Secondary | ICD-10-CM | POA: Diagnosis not present

## 2016-06-18 DIAGNOSIS — E038 Other specified hypothyroidism: Secondary | ICD-10-CM

## 2016-06-18 LAB — LIPID PANEL
CHOL/HDL RATIO: 5
CHOLESTEROL: 180 mg/dL (ref 0–200)
HDL: 37.4 mg/dL — ABNORMAL LOW (ref >39.00)
NONHDL: 142.16
TRIGLYCERIDES: 263 mg/dL — AB (ref 0.0–149.0)
VLDL: 52.6 mg/dL — AB (ref 0.0–40.0)

## 2016-06-18 LAB — HEPATIC FUNCTION PANEL
ALT: 16 U/L (ref 0–53)
AST: 17 U/L (ref 0–37)
Albumin: 4 g/dL (ref 3.5–5.2)
Alkaline Phosphatase: 101 U/L (ref 39–117)
BILIRUBIN DIRECT: 0.2 mg/dL (ref 0.0–0.3)
BILIRUBIN TOTAL: 0.7 mg/dL (ref 0.2–1.2)
Total Protein: 6.9 g/dL (ref 6.0–8.3)

## 2016-06-18 LAB — BASIC METABOLIC PANEL
BUN: 24 mg/dL — AB (ref 6–23)
CHLORIDE: 107 meq/L (ref 96–112)
CO2: 30 mEq/L (ref 19–32)
CREATININE: 1.63 mg/dL — AB (ref 0.40–1.50)
Calcium: 9.5 mg/dL (ref 8.4–10.5)
GFR: 43.51 mL/min — AB (ref >60.00)
Glucose, Bld: 104 mg/dL — ABNORMAL HIGH (ref 70–99)
POTASSIUM: 5 meq/L (ref 3.5–5.1)
Sodium: 140 mEq/L (ref 135–145)

## 2016-06-18 LAB — CBC WITH DIFFERENTIAL/PLATELET
BASOS PCT: 0.9 % (ref 0.0–3.0)
Basophils Absolute: 0 10*3/uL (ref 0.0–0.1)
EOS ABS: 0.6 10*3/uL (ref 0.0–0.7)
EOS PCT: 12 % — AB (ref 0.0–5.0)
HCT: 40.5 % (ref 39.0–52.0)
HEMOGLOBIN: 13.7 g/dL (ref 13.0–17.0)
Lymphocytes Relative: 29.8 % (ref 12.0–46.0)
Lymphs Abs: 1.5 10*3/uL (ref 0.7–4.0)
MCHC: 33.7 g/dL (ref 30.0–36.0)
MCV: 95.9 fl (ref 78.0–100.0)
Monocytes Absolute: 0.6 10*3/uL (ref 0.1–1.0)
Monocytes Relative: 12.6 % — ABNORMAL HIGH (ref 3.0–12.0)
NEUTROS ABS: 2.3 10*3/uL (ref 1.4–7.7)
Neutrophils Relative %: 44.7 % (ref 43.0–77.0)
PLATELETS: 193 10*3/uL (ref 150.0–400.0)
RBC: 4.23 Mil/uL (ref 4.22–5.81)
RDW: 14.4 % (ref 11.5–15.5)
WBC: 5.1 10*3/uL (ref 4.0–10.5)

## 2016-06-18 LAB — URINALYSIS, ROUTINE W REFLEX MICROSCOPIC
Bilirubin Urine: NEGATIVE
Hgb urine dipstick: NEGATIVE
Ketones, ur: NEGATIVE
Leukocytes, UA: NEGATIVE
NITRITE: NEGATIVE
PH: 6 (ref 5.0–8.0)
RBC / HPF: NONE SEEN (ref 0–?)
SPECIFIC GRAVITY, URINE: 1.02 (ref 1.000–1.030)
Total Protein, Urine: NEGATIVE
Urine Glucose: NEGATIVE
Urobilinogen, UA: 0.2 (ref 0.0–1.0)

## 2016-06-18 LAB — PSA: PSA: 1.73 ng/mL (ref 0.10–4.00)

## 2016-06-18 LAB — LDL CHOLESTEROL, DIRECT: LDL DIRECT: 84 mg/dL

## 2016-06-18 LAB — TSH: TSH: 1.19 u[IU]/mL (ref 0.35–4.50)

## 2016-06-18 MED ORDER — LEVOTHYROXINE SODIUM 125 MCG PO TABS: 125.0000 ug | ORAL_TABLET | Freq: Every day | ORAL | 3 refills | Status: DC

## 2016-06-18 MED ORDER — AMIODARONE HCL 200 MG PO TABS: 100.0000 mg | ORAL_TABLET | Freq: Every day | ORAL | 0 refills | Status: DC

## 2016-06-18 MED ORDER — ALLOPURINOL 300 MG PO TABS
300.0000 mg | ORAL_TABLET | Freq: Every day | ORAL | 3 refills | Status: DC
Start: 2016-06-18 — End: 2017-05-07

## 2016-06-18 MED ORDER — VARDENAFIL HCL 20 MG PO TABS: 20.0000 mg | ORAL_TABLET | ORAL | 11 refills | Status: DC | PRN

## 2016-06-18 MED ORDER — ALBUTEROL SULFATE HFA 108 (90 BASE) MCG/ACT IN AERS: 2.0000 | INHALATION_SPRAY | Freq: Four times a day (QID) | RESPIRATORY_TRACT | 3 refills | Status: DC | PRN

## 2016-06-18 MED ORDER — PANTOPRAZOLE SODIUM 40 MG PO TBEC: 40.0000 mg | DELAYED_RELEASE_TABLET | Freq: Every day | ORAL | 3 refills | Status: DC

## 2016-06-18 MED ORDER — TIOTROPIUM BROMIDE MONOHYDRATE 18 MCG IN CAPS: ORAL_CAPSULE | RESPIRATORY_TRACT | 3 refills | Status: DC

## 2016-06-18 NOTE — Progress Notes (Signed)
Subjective:    Patient ID: Omar Benson, male    DOB: 09-03-1934, 82 y.o.   MRN: [id number]  HPI  Here for yearly f/u;  Overall doing ok;  Pt denies Chest pain, worsening SOB, DOE, wheezing, orthopnea, PND, worsening LE edema, palpitations, dizziness or syncope, but does ask for inhaler refills as seem to be helping.  Pt denies neurological change such as new headache, facial or extremity weakness.  Pt denies polydipsia, polyuria, or low sugar symptoms. Pt states overall good compliance with treatment and medications, good tolerability, and has been trying to follow appropriate diet.  Pt denies worsening depressive symptoms, suicidal ideation or panic. No fever, night sweats, wt loss, loss of appetite, or other constitutional symptoms.  Pt states good ability with ADL's, has low fall risk, home safety reviewed and adequate, no other significant changes in hearing or vision.  Due for AAA f/u u/s, also asks for pulm referral, but not previous MD as he had to wait > 30 min.  Colonoscoyp sched for march 2018.  Not on anticoagulant such as xarelto due to GI bleeding x 2.  Last AAA u/s was nov 2016.  Denies hyper or hypo thyroid symptoms such as voice, skin or hair change.  No other new history Past Medical History:  Diagnosis Date  . AAA 09/19/2008  . Allergy   . ANEMIA-IRON DEFICIENCY 09/19/2008  . ANXIETY 06/13/2010  . Arthritis   . Atrial fibrillation ([place]) 04/19/2008   amiodarone rx;  Echocardiogram 05/26/11: No wall motion abnormalities, mild LVH, EF 60%.  . Atrial flutter ([place]) 12/05/2008   s/p RFCA  . Benign esophageal stricture 09/2075   dilated during EGD  . BRADYCARDIA 12/05/2008  . CAD 12/05/2008   s/p CABG; NSTEMI 05/2011 - LHC 05/25/11: LAD occluded, LIMA-LAD patent, ostial circumflex 50%, AV circumflex 70%, SVG-OM2 with extensive disease with thrombus (culprit vessel), RCA 80% and occluded, SVG-intermediate patent, SVG-PDA/PL A patent.  Given that the culprit vessel was a subtotally occluded heavily  thrombotic graft to a smaller OM2, medical therapy was recommended.;   . CKD (chronic kidney disease) 09/19/2008   Qualifier: Diagnosis of  By: Jenny Reichmann MD, [place], HX OF 09/19/2008   adenomatous polyps 07/2010  . COPD (chronic obstructive pulmonary disease) ([place])   . CORONARY ARTERY BYPASS GRAFT, HX OF 12/05/2008   A. LIMA-LAD, VG-RI, VG-OM2, VG-RPD/RPL;  B. 05/2011 - NSTEMI - CATH WITH 4/5 PATENT GRAFTS AND NEW THROMBUS IN DISTAL VG-OM2 - MED RX  . DIVERTICULOSIS, COLON 09/19/2008  . Erectile dysfunction 09/13/2012  . GASTROINTESTINAL HEMORRHAGE, HX OF 04/19/2008  . GERD 09/19/2008  . GOUT 09/19/2008  . HYPERLIPIDEMIA 09/19/2008  . HYPERTENSION 09/19/2008  . Hypothyroidism 12/05/2008  . LOW BACK PAIN 09/19/2008  . Myocardial infarction   . NEPHROLITHIASIS, HX OF 01/11/2009  . PERIPHERAL VASCULAR DISEASE 09/19/2008  . PSA, INCREASED 09/19/2008  . RENAL INSUFFICIENCY 09/19/2008  . Unstable angina pectoris ([place]) 05/2015   Cath 02/02 w/ patent LIMA-LAD, SVG-D1, SVG-RCA, severe dz in SVG-OM, med rx   Past Surgical History:  Procedure Laterality Date  . CARDIAC CATHETERIZATION  05/25/2011  . CARDIAC CATHETERIZATION N/A 06/20/2015   Procedure: Coronary/Graft Angiography;  Surgeon: Peter M Martinique, MD; LAD, CFX & RCA 100%; LIMA-LAD, SVG-D1 & SVG-RCA OK; SVG-OM severe dz, med rx   . COLON SURGERY    . CORONARY ARTERY BYPASS GRAFT  2004 X 5  . ENTEROSCOPY N/A 02/13/2014   Procedure: ENTEROSCOPY;  Surgeon: [name],  MD;  Location: [facility];  Service: Gastroenterology;  Laterality: N/A;  super slim  . ESOPHAGOGASTRODUODENOSCOPY  04/28/2012   Procedure: ESOPHAGOGASTRODUODENOSCOPY (EGD);  Surgeon: [name], MD;  Location: [facility] [facility];  Service: Endoscopy;  Laterality: N/A;  . LEFT HEART CATHETERIZATION WITH CORONARY/GRAFT ANGIOGRAM  05/25/2011   Procedure: LEFT HEART CATHETERIZATION WITH [name];  Surgeon: [name], MD;  [facility] C[facility] LAB;  Service: Cardiovascular;;    . s/p afib ablation  2004    reports that he quit smoking about 33 years ago. His smoking use included Cigarettes. He has a 124.00 pack-year smoking history. He has never used smokeless tobacco. He reports that he drinks about 6.0 oz of alcohol per week . He reports that he does not use drugs. family history includes Cancer in his mother; Diabetes in his father and sister; Heart attack in his maternal uncle, paternal grandfather, and paternal grandmother; Heart disease in his maternal uncle and paternal uncle; Lymphoma in his sister; Multiple myeloma in his mother; Other in his mother. Allergies  Allergen Reactions  . Bee Venom Anaphylaxis    Only yellow jackets   . Promethazine Hcl Other (See Comments)    syncope  . Doxycycline Hyclate Other (See Comments)    esophagitis  . Lipitor [Atorvastatin Calcium] Other (See Comments)    Myalgia/myopathy  . Other Diarrhea and Nausea And Vomiting    Seafood  . Tetracycline Other (See Comments)    Esophagitis  . Crestor [Rosuvastatin] Other (See Comments)    Myalgia/myopathy  . Tetanus Toxoid Rash   Current Outpatient Prescriptions on File Prior to Visit  Medication Sig Dispense Refill  . aspirin 81 MG tablet Take 162 mg by mouth daily.    Marland Kitchen b complex vitamins tablet Take 1 tablet by mouth daily.    . calcium elemental as carbonate (TUMS ULTRA 1000) 400 MG tablet Chew 1,000 mg by mouth daily as needed for heartburn.    . Cholecalciferol (VITAMIN D) 2000 UNITS CAPS Take 2,000 Units by mouth daily.     Marland Kitchen EPINEPHrine (EPIPEN) 0.3 mg/0.3 mL SOAJ injection Inject 0.3 mLs (0.3 mg total) into the muscle once. 2 Device 2  . hydrocortisone cream 0.5 % Apply 1 application topically daily. Reported on 06/27/2015    . MAGNESIUM PO Take 1 tablet by mouth daily.     . Multiple Vitamin (MULTIVITAMIN) tablet Take 1 tablet by mouth at bedtime.     . nitroGLYCERIN (NITROSTAT) 0.4 MG SL tablet PLACE 1 TABLET UNDER THE TONGUE EVERY 5 MINUTES AS NEEDED FOR CHEST  PAIN FOR UP TO 3 DOSES. 25 tablet 5  . vitamin C (ASCORBIC ACID) 500 MG tablet Take 500 mg by mouth daily.     No current facility-administered medications on file prior to visit.    Review of Systems Constitutional: Negative for increased diaphoresis, or other activity, appetite or siginficant weight change other than noted HENT: Negative for worsening hearing loss, ear pain, facial swelling, mouth sores and neck stiffness.   Eyes: Negative for other worsening pain, redness or visual disturbance.  Respiratory: Negative for choking or stridor Cardiovascular: Negative for other chest pain and palpitations.  Gastrointestinal: Negative for worsening diarrhea, blood in stool, or abdominal distention Genitourinary: Negative for hematuria, flank pain or change in urine volume.  Musculoskeletal: Negative for myalgias or other joint complaints.  Skin: Negative for other color change and wound or drainage.  Neurological: Negative for syncope and numbness. other than noted Hematological: Negative for adenopathy. or other  swelling Psychiatric/Behavioral: Negative for hallucinations, SI, self-injury, decreased concentration or other worsening agitation.  All other system neg per pt    Objective:   Physical Exam BP 138/80   Pulse (!) 51   Temp 98.2 F (36.8 C) (Oral)   Resp 20   Wt 187 lb (84.8 kg)   SpO2 96%   BMI 29.29 kg/m  VS noted, not ill appaering Constitutional: Pt is oriented to person, place, and time. Appears well-developed and well-nourished, in no significant distress Head: Normocephalic and atraumatic  Eyes: Conjunctivae and EOM are normal. Pupils are equal, round, and reactive to light Right Ear: External ear normal.  Left Ear: External ear normal Nose: Nose normal.  Mouth/Throat: Oropharynx is clear and moist  Neck: Normal range of motion. Neck supple. No JVD present. No tracheal deviation present or significant neck LA or mass Cardiovascular: Normal rate, regular rhythm,  normal heart sounds and intact distal pulses.   Pulmonary/Chest: Effort normal and breath sounds decreased without rales or wheezing  Abdominal: Soft. Bowel sounds are normal. NT. No HSM , no pulsatile mass Musculoskeletal: Normal range of motion. Exhibits no edema Lymphadenopathy: Has no cervical adenopathy.  Neurological: Pt is alert and oriented to person, place, and time. Pt has normal reflexes. No cranial nerve deficit. Motor grossly intact Skin: Skin is warm and dry. No rash noted or new ulcers Psychiatric:  Has irrasicble mood and affect. Behavior is normal.  No other new exam findings    Assessment & Plan:

## 2016-06-18 NOTE — Assessment & Plan Note (Signed)
Asympt, for AAA f/u u/s, cont risk factor control efforts

## 2016-06-18 NOTE — Patient Instructions (Signed)
Please continue all other medications as before, and refills have been done if requested.  Please have the pharmacy call with any other refills you may need.  Please continue your efforts at being more active, low cholesterol diet, and weight control.  You are otherwise up to date with prevention measures today.  Please keep your appointments with your specialists as you may have planned  You will be contacted regarding the referral for: Aorta ultrasound  You will be contacted regarding the referral for: pulmonary  Please go to the LAB in the Basement (turn left off the elevator) for the tests to be done today  You will be contacted by phone if any changes need to be made immediately.  Otherwise, you will receive a letter about your results with an explanation, but please check with MyChart first.  Please remember to sign up for MyChart if you have not done so, as this will be important to you in the future with finding out test results, communicating by private email, and scheduling acute appointments online when needed.  Please return in 6 months, or sooner if needed

## 2016-06-18 NOTE — Assessment & Plan Note (Signed)
stable overall by history and exam, recent data reviewed with pt, and pt to continue medical treatment as before,  to f/u any worsening symptoms or concerns Lab Results  Component Value Date   TSH 1.654 06/19/2015

## 2016-06-18 NOTE — Assessment & Plan Note (Addendum)
stable overall by history and exam, recent data reviewed with pt, and pt to continue medical treatment as before,  to f/u any worsening symptoms or concerns @LASTSAO2 (3)@ For med refills  Note:  Total time for pt hx, exam, review of record with pt in the room, determination of diagnoses and plan for further eval and tx is > 40 min, with over 50% spent in coordination and counseling of patient

## 2016-06-18 NOTE — Progress Notes (Signed)
Pre visit review using our clinic review tool, if applicable. No additional management support is needed unless otherwise documented below in the visit note. 

## 2016-06-18 NOTE — Assessment & Plan Note (Signed)
Stable, recently in [place] per cardiology, holter neg for PAF, not anticoag candidate due to recurrent GI bleed on xarelto x 2

## 2016-06-18 NOTE — Assessment & Plan Note (Signed)
stable overall by history and exam, recent data reviewed with pt, and pt to continue medical treatment as before,  to f/u any worsening symptoms or concerns Lab Results  Component Value Date   LDLCALC 85 06/20/2015    

## 2016-06-18 NOTE — Assessment & Plan Note (Signed)
stable overall by history and exam, recent data reviewed with pt, and pt to continue medical treatment as before,  to f/u any worsening symptoms or concerns BP Readings from Last 3 Encounters:  06/18/16 138/80  05/27/16 138/64  04/13/16 120/60

## 2016-06-24 ENCOUNTER — Ambulatory Visit: Payer: Medicare Other | Admitting: Internal Medicine

## 2016-06-25 ENCOUNTER — Ambulatory Visit
Admission: RE | Admit: 2016-06-25 | Discharge: 2016-06-25 | Disposition: A | Discharge status: Home / Self Care | Payer: Medicare Other | Source: Ambulatory Visit | Attending: Internal Medicine | Admitting: Internal Medicine

## 2016-06-25 DIAGNOSIS — I714 Abdominal aortic aneurysm, without rupture, unspecified: Secondary | ICD-10-CM

## 2016-07-09 ENCOUNTER — Ambulatory Visit (INDEPENDENT_AMBULATORY_CARE_PROVIDER_SITE_OTHER): Payer: Medicare Other | Admitting: Family Medicine

## 2016-07-09 ENCOUNTER — Encounter: Payer: Self-pay | Admitting: Family Medicine

## 2016-07-09 ENCOUNTER — Ambulatory Visit: Payer: Medicare Other | Admitting: *Deleted

## 2016-07-09 VITALS — BP 100/62 | HR 56 | Temp 97.9°F | Ht 67.0 in | Wt 187.4 lb

## 2016-07-09 DIAGNOSIS — Z86010 Personal history of colonic polyps: Secondary | ICD-10-CM

## 2016-07-09 DIAGNOSIS — J988 Other specified respiratory disorders: Secondary | ICD-10-CM | POA: Diagnosis not present

## 2016-07-09 MED ORDER — AMOXICILLIN-POT CLAVULANATE 875-125 MG PO TABS: 1.0000 | ORAL_TABLET | Freq: Two times a day (BID) | ORAL | 0 refills | Status: DC

## 2016-07-09 MED ORDER — BENZONATATE 100 MG PO CAPS: 100.0000 mg | ORAL_CAPSULE | Freq: Two times a day (BID) | ORAL | 0 refills | Status: DC | PRN

## 2016-07-09 NOTE — Progress Notes (Signed)
Patient came to pre-visit feeling poorly. Stated that he had the flu last week and still had SOB and was very tired.  Spitting up green mucous.  Called his PCP and arranged a visit for today at 3:30pm. Patient will reschedule when her feels better.

## 2016-07-09 NOTE — Progress Notes (Signed)
HPI:  URI: -started: about 5 days ago -symptoms:nasal congestion, sore throat, PND, cough, sinus pressure, some green and yellow congestion, low grade temp around 100.2 at the highest the first few days -denies:fever > 100.4, SOB, wheezing, NVD, tooth pain, sinus pain, ear pain, body aches -has tried: flonase which helped some -sick contacts/travel/risks: no reported flu, strep or tick exposure -Hx of: allergies, COPD  ROS: See pertinent positives and negatives per HPI.  Past Medical History:  Diagnosis Date  . AAA 09/19/2008  . Allergy   . ANEMIA-IRON DEFICIENCY 09/19/2008  . ANXIETY 06/13/2010  . Arthritis   . Atrial fibrillation ([place]) 04/19/2008   amiodarone rx;  Echocardiogram 05/26/11: No wall motion abnormalities, mild LVH, EF 60%.  . Atrial flutter ([place]) 12/05/2008   s/p RFCA  . Benign esophageal stricture 09/2075   dilated during EGD  . BRADYCARDIA 12/05/2008  . CAD 12/05/2008   s/p CABG; NSTEMI 05/2011 - LHC 05/25/11: LAD occluded, LIMA-LAD patent, ostial circumflex 50%, AV circumflex 70%, SVG-OM2 with extensive disease with thrombus (culprit vessel), RCA 80% and occluded, SVG-intermediate patent, SVG-PDA/PL A patent.  Given that the culprit vessel was a subtotally occluded heavily thrombotic graft to a smaller OM2, medical therapy was recommended.;   . CKD (chronic kidney disease) 09/19/2008   Qualifier: Diagnosis of  By: Jenny Reichmann MD, [place], HX OF 09/19/2008   adenomatous polyps 07/2010  . COPD (chronic obstructive pulmonary disease) ([place])   . CORONARY ARTERY BYPASS GRAFT, HX OF 12/05/2008   A. LIMA-LAD, VG-RI, VG-OM2, VG-RPD/RPL;  B. 05/2011 - NSTEMI - CATH WITH 4/5 PATENT GRAFTS AND NEW THROMBUS IN DISTAL VG-OM2 - MED RX  . DIVERTICULOSIS, COLON 09/19/2008  . Erectile dysfunction 09/13/2012  . GASTROINTESTINAL HEMORRHAGE, HX OF 04/19/2008  . GERD 09/19/2008  . GOUT 09/19/2008  . HYPERLIPIDEMIA 09/19/2008  . HYPERTENSION 09/19/2008  . Hypothyroidism 12/05/2008  . LOW BACK PAIN  09/19/2008  . Myocardial infarction   . NEPHROLITHIASIS, HX OF 01/11/2009  . PERIPHERAL VASCULAR DISEASE 09/19/2008  . PSA, INCREASED 09/19/2008  . RENAL INSUFFICIENCY 09/19/2008  . Unstable angina pectoris ([place]) 05/2015   Cath 02/02 w/ patent LIMA-LAD, SVG-D1, SVG-RCA, severe dz in SVG-OM, med rx    Past Surgical History:  Procedure Laterality Date  . CARDIAC CATHETERIZATION  05/25/2011  . CARDIAC CATHETERIZATION N/A 06/20/2015   Procedure: Coronary/Graft Angiography;  Surgeon: Peter M Martinique, MD; LAD, CFX & RCA 100%; LIMA-LAD, SVG-D1 & SVG-RCA OK; SVG-OM severe dz, med rx   . COLON SURGERY    . CORONARY ARTERY BYPASS GRAFT  2004 X 5  . ENTEROSCOPY N/A 02/13/2014   Procedure: ENTEROSCOPY;  Surgeon: [name], MD;  Location: [facility] ENDOSCOPY;  Service: Gastroenterology;  Laterality: N/A;  super slim  . ESOPHAGOGASTRODUODENOSCOPY  04/28/2012   Procedure: ESOPHAGOGASTRODUODENOSCOPY (EGD);  Surgeon: [name], MD;  Location: [facility];  Service: Endoscopy;  Laterality: N/A;  . LEFT HEART CATHETERIZATION WITH CORONARY/GRAFT ANGIOGRAM  05/25/2011   Procedure: LEFT HEART CATHETERIZATION WITH [name];  Surgeon: [name], MD;  Location: [facility] CATH LAB;  Service: Cardiovascular;;  . s/p afib ablation  2004    Family History  Problem Relation Age of Onset  . Multiple myeloma Mother   . Cancer Mother   . Other Mother     varicose veins  . Diabetes Father   . Diabetes Sister   . Heart disease Paternal Uncle   . Heart disease Maternal Uncle   . Lymphoma Sister  half-sister  . Heart attack Maternal Uncle   . Heart attack Paternal Grandmother   . Heart attack Paternal Grandfather   . Hypertension Neg Hx   . Stroke Neg Hx     Social History   Social History  . Marital status: Married    Spouse name: [name]  . Number of children: N/A  . Years of education: N/A   Occupational History  . 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  Smoking status: Former Smoker    Packs/day: 4.00    Years: 31.00    Types: Cigarettes    Quit date: 03/19/1983  . Smokeless tobacco: Never Used  . Alcohol use 6.0 oz/week    10 Shots of liquor per week  . Drug use: No  . Sexual activity: Yes   Other Topics Concern  . None   Social History Narrative   Married lives with his wife     Current Outpatient Prescriptions:  .  albuterol (PROVENTIL HFA;VENTOLIN HFA) 108 (90 Base) MCG/ACT inhaler, Inhale 2 puffs into the lungs every 6 (six) hours as needed for wheezing or shortness of breath. Reported on 08/09/2015, Disp: 3 Inhaler, Rfl: 3 .  allopurinol (ZYLOPRIM) 300 MG tablet, Take 1 tablet (300 mg total) by mouth daily., Disp: 90 tablet, Rfl: 3 .  amiodarone (PACERONE) 200 MG tablet, Take 0.5 tablets (100 mg total) by mouth daily., Disp: 45 tablet, Rfl: 0 .  aspirin 81 MG tablet, Take 162 mg by mouth daily., Disp: , Rfl:  .  b complex vitamins tablet, Take 1 tablet by mouth daily., Disp: , Rfl:  .  calcium elemental as carbonate (TUMS ULTRA 1000) 400 MG tablet, Chew 1,000 mg by mouth daily as needed for heartburn., Disp: , Rfl:  .  Cholecalciferol (VITAMIN D) 2000 UNITS CAPS, Take 2,000 Units by mouth daily. , Disp: , Rfl:  .  EPINEPHrine (EPIPEN) 0.3 mg/0.3 mL SOAJ injection, Inject 0.3 mLs (0.3 mg total) into the muscle once., Disp: 2 Device, Rfl: 2 .  hydrocortisone cream 0.5 %, Apply 1 application topically daily. Reported on 06/27/2015, Disp: , Rfl:  .  levothyroxine (SYNTHROID, LEVOTHROID) 125 MCG tablet, Take 1 tablet (125 mcg total) by mouth daily., Disp: 90 tablet, Rfl: 3 .  MAGNESIUM PO, Take 1 tablet by mouth daily. , Disp: , Rfl:  .  Multiple Vitamin (MULTIVITAMIN) tablet, Take 1 tablet by mouth at bedtime. , Disp: , Rfl:  .  nitroGLYCERIN (NITROSTAT) 0.4 MG SL tablet, PLACE 1 TABLET UNDER THE TONGUE EVERY 5 MINUTES AS NEEDED FOR CHEST PAIN FOR UP TO 3 DOSES., Disp: 25 tablet, Rfl: 5 .  pantoprazole (PROTONIX) 40 MG tablet, Take 1  tablet (40 mg total) by mouth daily. Yearly physical due in February must see MD for refills, Disp: 90 tablet, Rfl: 3 .  tiotropium (SPIRIVA HANDIHALER) 18 MCG inhalation capsule, PLACE 1 CAPSULE (18 MCG TOTAL) INTO INHALER AND INHALE DAILY., Disp: 90 capsule, Rfl: 3 .  vardenafil (LEVITRA) 20 MG tablet, Take 1 tablet (20 mg total) by mouth as needed for erectile dysfunction., Disp: 5 tablet, Rfl: 11 .  vitamin C (ASCORBIC ACID) 500 MG tablet, Take 500 mg by mouth daily., Disp: , Rfl:  .  amoxicillin-clavulanate (AUGMENTIN) 875-125 MG tablet, Take 1 tablet by mouth 2 (two) times daily., Disp: 14 tablet, Rfl: 0 .  benzonatate (TESSALON) 100 MG capsule, Take 1 capsule (100 mg total) by mouth 2 (two) times daily as needed for  cough., Disp: 20 capsule, Rfl: 0  EXAM:  Vitals:   07/09/16 1528  BP: 100/62  Pulse: (!) 56  Temp: 97.9 F (36.6 C)    Body mass index is 29.35 kg/m.  GENERAL: vitals reviewed and listed above, alert, oriented, appears well hydrated and in no acute distress  HEENT: atraumatic, conjunttiva clear, no obvious abnormalities on inspection of external nose and ears, normal appearance of ear canals and TMs, clear nasal congestion, mild post oropharyngeal erythema with PND, no tonsillar edema or exudate, no sinus TTP  NECK: no obvious masses on inspection  LUNGS: clear to auscultation bilaterally, no wheezes, rales or rhonchi, good air movement  CV: HRRR, no peripheral edema  MS: moves all extremities without noticeable abnormality  PSYCH: pleasant and cooperative, no obvious depression or anxiety  ASSESSMENT AND PLAN:  Discussed the following assessment and plan:  Respiratory infection  -given HPI and exam findings today, a serious infection or illness is unlikely. We discussed potential etiologies, with VURI being most likely, and advised supportive care and monitoring. Discussed potential for mild flu, bronchitis, LRI, sinusitis all much less likely. Offered CXR  - declined. Tessalon for cough. Discussed prednisone and he prefers to avoid. Aug rx after discussion risks various abx to use if symptoms persist or sinus pain develops. We discussed treatment side effects, likely course, antibiotic misuse, transmission, and signs of developing a serious illness. Out of tx window for any benefit form tamiflu if was the flu. -of course, we advised to return or notify a doctor immediately if symptoms worsen or persist or new concerns arise.    Patient Instructions  Tessalon if needed for cough.  Antibiotic if not improving.  Seek care immediately if worsening, new concerns or not resolving as we discussed   Colin Benton R., DO

## 2016-07-09 NOTE — Progress Notes (Signed)
Pre visit review using our clinic review tool, if applicable. No additional management support is needed unless otherwise documented below in the visit note. 

## 2016-07-09 NOTE — Patient Instructions (Signed)
Tessalon if needed for cough.  Antibiotic if not improving.  Seek care immediately if worsening, new concerns or not resolving as we discussed

## 2016-07-21 ENCOUNTER — Ambulatory Visit (INDEPENDENT_AMBULATORY_CARE_PROVIDER_SITE_OTHER): Payer: Medicare Other | Admitting: Internal Medicine

## 2016-07-21 ENCOUNTER — Encounter: Payer: Self-pay | Admitting: Internal Medicine

## 2016-07-21 VITALS — BP 122/80 | HR 57 | Ht 69.0 in | Wt 188.6 lb

## 2016-07-21 DIAGNOSIS — J449 Chronic obstructive pulmonary disease, unspecified: Secondary | ICD-10-CM | POA: Diagnosis not present

## 2016-07-21 NOTE — Patient Instructions (Signed)
Start a regular exercise program like you did before and after 2 weeks stop spiriva and see what difference if makes (think of it like high octane fuel)   If not happy with the cost of spiriva and feel it really helps you then return with all your medications in hand with your formulary hand.   Regular pulmonary follow up is as needed

## 2016-07-21 NOTE — Assessment & Plan Note (Addendum)
PFT's 08/2010:  FEV1 2.89 (102%), but ratio decreased at 66%.  TLC and DLCO normal  Omar Benson 09/12/2014: FEV1 2.64 (83%), ratio 81% on spiriva with min curvature   He really has minimal copd and I doubt the spiriva is doing much so reasonable to try off to see if he notes any change in ex tol or tendency to AB exac in which case prn laba/ics like symbicort just used for flares might be more appropriate but regular pulmonary f/u is not because:  When respiratory symptoms begin or become refractory well after a patient reports complete smoking cessation,  Especially when this wasn't the case while they were smoking, a red flag is raised based on the work of Dr Kris Mouton which states:  if you quit smoking when your best day FEV1 is still well preserved (as his is)  it is highly unlikely you will progress to severe disease.  That is to say, once the smoking stops,  the symptoms should not suddenly erupt or markedly worsen.  If so, the differential diagnosis should include  obesity/deconditioning,  LPR/Reflux/Aspiration syndromes,  occult CHF, or  especially side effect of medications commonly used in this population (eg amiodarone, for which the best sign of toxicity is serial reduction is ex tolerance/ thus the recs in AVS)  I had an extended discussion with the patient reviewing all relevant studies completed to date and  lasting 25 minutes of a 40  minute transition of care  visit with pt not previously known to me   non-specific but potentially very serious refractory respiratory symptoms of unknown etiology (no significant copd on review of records  Each maintenance medication was reviewed in detail including most importantly the difference between maintenance and prns and under what circumstances the prns are to be triggered using an action plan format that is not reflected in the computer generated alphabetically organized AVS.    Please see AVS for specific instructions unique to this office visit that I  personally wrote and verbalized to the the pt in detail and then reviewed with pt  by my nurse highlighting any changes in therapy/plan of care  recommended at today's visit.

## 2016-07-21 NOTE — Progress Notes (Signed)
Subjective:     Patient ID: Omar Benson, male   DOB: 01/07/1935,     MRN: [id number]  HPI   46 yowm quit smoking 1984 prev followed by Dr [name] with minimal copd Final ov  09/12/14  spiriva DPI/ zyrtec      07/21/2016 transition of care   ov/Derrall Hicks re: COPD GOLD O/ maint on spiriva and amiodarone @ 200 mg daily  Chief Complaint  Patient presents with  . Pulmonary Consult    Former Dr [name] pt. He states that his breathing is at baseline. No co's today.    apparently started spiriva for " lump in throat / chest" but never really limited by breathing from desired activities including treadmill Stopped doing treadmill p recent uri/ resolved but not back to nl ex routine Some rhinitis x years/ watery / better on zyrtec/ worse in spring s assoc wheezing or aecopd pattern in spring  No obvious day to day or daytime variability or assoc excess/ purulent sputum or mucus plugs or hemoptysis or cp or chest tightness, subjective wheeze or overt sinus or hb symptoms. No unusual exp hx or h/o childhood pna/ asthma or knowledge of premature birth.  Sleeping ok without nocturnal  or early am exacerbation  of respiratory  c/o's or need for noct saba. Also denies any obvious fluctuation of symptoms with weather or environmental changes or other aggravating or alleviating factors except as outlined above   Current Medications, Allergies, Complete Past Medical History, Past Surgical History, Family History, and Social History were reviewed in [employer] record.  ROS  The following are not active complaints unless bolded sore throat, dysphagia, dental problems, itching, sneezing,  nasal congestion or excess/ purulent secretions, ear ache,   fever, chills, sweats, unintended wt loss, classically pleuritic or exertional cp,  orthopnea pnd or leg swelling, presyncope, palpitations, abdominal pain, anorexia, nausea, vomiting, diarrhea  or change in bowel or bladder habits, change in stools  or urine, dysuria,hematuria,  rash, arthralgias, visual complaints, headache, numbness, weakness or ataxia or problems with walking or coordination,  change in mood/affect or memory.               Review of Systems     Objective:   Physical Exam    anxious amb wm nad  Wt Readings from Last 3 Encounters:  07/21/16 188 lb 9.6 oz (85.5 kg)  07/09/16 187 lb 6.4 oz (85 kg)  06/18/16 187 lb (84.8 kg)    Vital signs reviewed - Note on arrival 02 sats  95% on RA     HEENT: nl dentition, turbinates bilaterally, and oropharynx. Nl external ear canals without cough reflex   NECK :  without JVD/Nodes/TM/ nl carotid upstrokes bilaterally   LUNGS: no acc muscle use,  Nl contour chest which is clear to A and P bilaterally without cough on insp or exp maneuvers   CV:  RRR  no s3 or murmur or increase in P2, and no edema   ABD:  soft and nontender with nl inspiratory excursion in the supine position. No bruits or organomegaly appreciated, bowel sounds nl  MS:  Nl gait/ ext warm without deformities, calf tenderness, cyanosis or clubbing No obvious joint restrictions   SKIN: warm and dry without lesions    NEURO:  alert, approp, nl sensorium with  no motor or cerebellar deficits apparent.     Assessment:

## 2016-07-23 ENCOUNTER — Encounter: Payer: Medicare Other | Admitting: Internal Medicine

## 2016-07-27 ENCOUNTER — Emergency Department (HOSPITAL_COMMUNITY): Payer: Medicare Other

## 2016-07-27 ENCOUNTER — Observation Stay (HOSPITAL_COMMUNITY)
Admission: EM | Admit: 2016-07-27 | Discharge: 2016-07-28 | Disposition: A | Discharge status: Home / Self Care | Payer: Medicare Other | Attending: Internal Medicine | Admitting: Internal Medicine

## 2016-07-27 ENCOUNTER — Encounter (HOSPITAL_COMMUNITY): Payer: Self-pay | Admitting: Emergency Medicine

## 2016-07-27 ENCOUNTER — Observation Stay (HOSPITAL_BASED_OUTPATIENT_CLINIC_OR_DEPARTMENT_OTHER): Payer: Medicare Other

## 2016-07-27 DIAGNOSIS — K5521 Angiodysplasia of colon with hemorrhage: Secondary | ICD-10-CM | POA: Diagnosis present

## 2016-07-27 DIAGNOSIS — D509 Iron deficiency anemia, unspecified: Secondary | ICD-10-CM | POA: Diagnosis present

## 2016-07-27 DIAGNOSIS — Z87891 Personal history of nicotine dependence: Secondary | ICD-10-CM | POA: Insufficient documentation

## 2016-07-27 DIAGNOSIS — I252 Old myocardial infarction: Secondary | ICD-10-CM | POA: Diagnosis not present

## 2016-07-27 DIAGNOSIS — R0789 Other chest pain: Secondary | ICD-10-CM | POA: Diagnosis not present

## 2016-07-27 DIAGNOSIS — E039 Hypothyroidism, unspecified: Secondary | ICD-10-CM | POA: Diagnosis not present

## 2016-07-27 DIAGNOSIS — I10 Essential (primary) hypertension: Secondary | ICD-10-CM

## 2016-07-27 DIAGNOSIS — N183 Chronic kidney disease, stage 3 unspecified: Secondary | ICD-10-CM | POA: Diagnosis present

## 2016-07-27 DIAGNOSIS — I25810 Atherosclerosis of coronary artery bypass graft(s) without angina pectoris: Secondary | ICD-10-CM | POA: Insufficient documentation

## 2016-07-27 DIAGNOSIS — Z951 Presence of aortocoronary bypass graft: Secondary | ICD-10-CM | POA: Diagnosis not present

## 2016-07-27 DIAGNOSIS — R079 Chest pain, unspecified: Secondary | ICD-10-CM | POA: Diagnosis not present

## 2016-07-27 DIAGNOSIS — K222 Esophageal obstruction: Secondary | ICD-10-CM | POA: Diagnosis not present

## 2016-07-27 DIAGNOSIS — I480 Paroxysmal atrial fibrillation: Secondary | ICD-10-CM | POA: Diagnosis present

## 2016-07-27 DIAGNOSIS — I129 Hypertensive chronic kidney disease with stage 1 through stage 4 chronic kidney disease, or unspecified chronic kidney disease: Secondary | ICD-10-CM | POA: Diagnosis not present

## 2016-07-27 DIAGNOSIS — E785 Hyperlipidemia, unspecified: Secondary | ICD-10-CM | POA: Diagnosis not present

## 2016-07-27 DIAGNOSIS — Z79899 Other long term (current) drug therapy: Secondary | ICD-10-CM | POA: Diagnosis not present

## 2016-07-27 DIAGNOSIS — J449 Chronic obstructive pulmonary disease, unspecified: Secondary | ICD-10-CM | POA: Insufficient documentation

## 2016-07-27 DIAGNOSIS — Z7982 Long term (current) use of aspirin: Secondary | ICD-10-CM | POA: Insufficient documentation

## 2016-07-27 DIAGNOSIS — I2510 Atherosclerotic heart disease of native coronary artery without angina pectoris: Secondary | ICD-10-CM | POA: Diagnosis present

## 2016-07-27 LAB — CBC WITH DIFFERENTIAL/PLATELET
BASOS PCT: 1 %
Basophils Absolute: 0 10*3/uL (ref 0.0–0.1)
EOS ABS: 0.5 10*3/uL (ref 0.0–0.7)
Eosinophils Relative: 8 %
HCT: 40.9 % (ref 39.0–52.0)
HEMOGLOBIN: 13.8 g/dL (ref 13.0–17.0)
Lymphocytes Relative: 35 %
Lymphs Abs: 2.2 10*3/uL (ref 0.7–4.0)
MCH: 32 pg (ref 26.0–34.0)
MCHC: 33.7 g/dL (ref 30.0–36.0)
MCV: 94.9 fL (ref 78.0–100.0)
MONOS PCT: 10 %
Monocytes Absolute: 0.6 10*3/uL (ref 0.1–1.0)
NEUTROS PCT: 46 %
Neutro Abs: 2.9 10*3/uL (ref 1.7–7.7)
Platelets: 186 10*3/uL (ref 150–400)
RBC: 4.31 MIL/uL (ref 4.22–5.81)
RDW: 14 % (ref 11.5–15.5)
WBC: 6.1 10*3/uL (ref 4.0–10.5)

## 2016-07-27 LAB — COMPREHENSIVE METABOLIC PANEL
ALBUMIN: 3.9 g/dL (ref 3.5–5.0)
ALK PHOS: 103 U/L (ref 38–126)
ALT: 28 U/L (ref 17–63)
ANION GAP: 9 (ref 5–15)
AST: 25 U/L (ref 15–41)
BUN: 20 mg/dL (ref 6–20)
CHLORIDE: 107 mmol/L (ref 101–111)
CO2: 23 mmol/L (ref 22–32)
Calcium: 9.4 mg/dL (ref 8.9–10.3)
Creatinine, Ser: 1.81 mg/dL — ABNORMAL HIGH (ref 0.61–1.24)
GFR calc Af Amer: 39 mL/min — ABNORMAL LOW (ref >60)
GFR calc non Af Amer: 34 mL/min — ABNORMAL LOW (ref >60)
GLUCOSE: 112 mg/dL — AB (ref 65–99)
POTASSIUM: 4.8 mmol/L (ref 3.5–5.1)
SODIUM: 139 mmol/L (ref 135–145)
Total Bilirubin: 0.6 mg/dL (ref 0.3–1.2)
Total Protein: 7.5 g/dL (ref 6.5–8.1)

## 2016-07-27 LAB — TROPONIN I

## 2016-07-27 LAB — I-STAT TROPONIN, ED: Troponin i, poc: 0 ng/mL (ref 0.00–0.08)

## 2016-07-27 LAB — ECHOCARDIOGRAM COMPLETE
Height: 69 in
WEIGHTICAEL: 2984 [oz_av]

## 2016-07-27 MED ORDER — MORPHINE SULFATE (PF) 4 MG/ML IV SOLN: 1.0000 mg | INTRAVENOUS | Status: DC | PRN

## 2016-07-27 MED ORDER — ONDANSETRON HCL 4 MG/2ML IJ SOLN
4.0000 mg | Freq: Once | INTRAMUSCULAR | Status: AC
  Administered 2016-07-27: 4 mg via INTRAVENOUS
  Filled 2016-07-27: qty 2

## 2016-07-27 MED ORDER — AMIODARONE HCL 100 MG PO TABS
100.0000 mg | ORAL_TABLET | Freq: Every day | ORAL | Status: DC
  Administered 2016-07-27: 100 mg via ORAL
  Filled 2016-07-27: qty 1

## 2016-07-27 MED ORDER — ONDANSETRON HCL 4 MG/2ML IJ SOLN: 4.0000 mg | Freq: Four times a day (QID) | INTRAMUSCULAR | Status: DC | PRN

## 2016-07-27 MED ORDER — LEVOTHYROXINE SODIUM 125 MCG PO TABS
125.0000 ug | ORAL_TABLET | Freq: Every day | ORAL | Status: DC
  Administered 2016-07-28: 125 ug via ORAL
  Filled 2016-07-27 (×2): qty 1

## 2016-07-27 MED ORDER — TIOTROPIUM BROMIDE MONOHYDRATE 18 MCG IN CAPS
18.0000 ug | ORAL_CAPSULE | Freq: Every day | RESPIRATORY_TRACT | Status: DC
  Filled 2016-07-27: qty 5

## 2016-07-27 MED ORDER — ALLOPURINOL 300 MG PO TABS
300.0000 mg | ORAL_TABLET | Freq: Every day | ORAL | Status: DC
  Administered 2016-07-27: 300 mg via ORAL
  Filled 2016-07-27: qty 1

## 2016-07-27 MED ORDER — PANTOPRAZOLE SODIUM 40 MG PO TBEC
40.0000 mg | DELAYED_RELEASE_TABLET | Freq: Every day | ORAL | Status: DC
  Administered 2016-07-27 – 2016-07-28 (×2): 40 mg via ORAL
  Filled 2016-07-27 (×2): qty 1

## 2016-07-27 MED ORDER — MORPHINE SULFATE (PF) 4 MG/ML IV SOLN
2.0000 mg | Freq: Once | INTRAVENOUS | Status: AC
  Administered 2016-07-27: 2 mg via INTRAVENOUS
  Filled 2016-07-27: qty 1

## 2016-07-27 MED ORDER — NITROGLYCERIN 0.4 MG SL SUBL
0.4000 mg | SUBLINGUAL_TABLET | SUBLINGUAL | Status: DC | PRN
  Administered 2016-07-27: 0.4 mg via SUBLINGUAL

## 2016-07-27 MED ORDER — ACETAMINOPHEN 325 MG PO TABS
650.0000 mg | ORAL_TABLET | ORAL | Status: DC | PRN
  Administered 2016-07-27 – 2016-07-28 (×3): 650 mg via ORAL
  Filled 2016-07-27 (×3): qty 2

## 2016-07-27 MED ORDER — AMIODARONE HCL 100 MG PO TABS
100.0000 mg | ORAL_TABLET | Freq: Every day | ORAL | Status: DC
  Filled 2016-07-27: qty 1

## 2016-07-27 MED ORDER — ALBUTEROL SULFATE (2.5 MG/3ML) 0.083% IN NEBU: 3.0000 mL | INHALATION_SOLUTION | Freq: Four times a day (QID) | RESPIRATORY_TRACT | Status: DC | PRN

## 2016-07-27 MED ORDER — NITROGLYCERIN 2 % TD OINT
0.5000 [in_us] | TOPICAL_OINTMENT | Freq: Four times a day (QID) | TRANSDERMAL | Status: DC
  Administered 2016-07-27 (×3): 0.5 [in_us] via TOPICAL
  Filled 2016-07-27: qty 30

## 2016-07-27 MED ORDER — ASPIRIN 81 MG PO CHEW
162.0000 mg | CHEWABLE_TABLET | Freq: Every day | ORAL | Status: DC
  Filled 2016-07-27: qty 2

## 2016-07-27 MED ORDER — ALLOPURINOL 300 MG PO TABS
300.0000 mg | ORAL_TABLET | Freq: Every day | ORAL | Status: DC
  Filled 2016-07-27: qty 1

## 2016-07-27 MED ORDER — NITROGLYCERIN 2 % TD OINT
1.0000 [in_us] | TOPICAL_OINTMENT | Freq: Four times a day (QID) | TRANSDERMAL | Status: DC
  Administered 2016-07-27: 1 [in_us] via TOPICAL
  Filled 2016-07-27: qty 1

## 2016-07-27 MED ORDER — ASPIRIN 81 MG PO CHEW: 162.0000 mg | CHEWABLE_TABLET | Freq: Every day | ORAL | Status: DC

## 2016-07-27 MED ORDER — ENOXAPARIN SODIUM 40 MG/0.4ML ~~LOC~~ SOLN
40.0000 mg | SUBCUTANEOUS | Status: DC
  Administered 2016-07-27: 40 mg via SUBCUTANEOUS
  Filled 2016-07-27 (×2): qty 0.4

## 2016-07-27 NOTE — Progress Notes (Signed)
  Echocardiogram 2D Echocardiogram has been performed.  Omar Benson 07/27/2016, 3:27 PM

## 2016-07-27 NOTE — H&P (Signed)
History and Physical    ASAR EVILSIZER TDS:[id number] DOB: [birth year] DOA: 07/27/2016   PCP: [name], MD   Patient coming from/Resides with: Private residence/wife  Admission status: Observation/telemetry -it may be medically necessary to stay a minimum 2 midnights to rule out impending and/or unexpected changes in physiologic status that may differ from initial evaluation performed in the ER and/or at time of admission therefore consider reevaluation of admission status in 24 hours.   Chief Complaint: Chest pressure  HPI: Omar Benson is a 82 y.o. male with medical history significant for CAD status post CABG in 2004, cardiac catheterization was completed February 2017 with severely diseased SVG to OM if medical therapy recommended, atrial fibrillation on amiodarone, no anticoagulation secondary to GI bleeding/AVMs small bowel, dyslipidemia, hypothyroidism, hypertension, chronic kidney disease stage III, iron deficiency anemia, reflux and history of esophageal stricture. Patient presents with chest pressure involving the anterior chest just beneath the throat. He also is reporting a soreness in the chest primarily in the right anterior shoulder region that is reproducible with palpation applied at time of active range of motion. Patient has been experiencing the symptoms on and off for about a week. He has not had any shortness of breath, diaphoresis, chest discomfort during or after eating, no cough, chills or fever. He reports that the symptoms began at night and seem to be worse upon awakening that after he gets up and moves around appeared to improved. Symptoms are migratory times radiate into the mid back. He has been using BenGay with some improvement in symptoms. He did take nitroglycerin and aspirin at home without any resolution of the pain. Nitropaste has been applied in the ER and currently patient is pain-free except for the aforementioned pain reproducible with palpation.  ED Course:    Vital Signs: BP 152/75   Pulse (!) 50   Resp 17   Ht '5\' 9"'$  (1.753 m)   Wt 81.6 kg (180 lb)   SpO2 96%   BMI 26.58 kg/m  PCXR: Neg Lab data: Sodium 139, potassium 4.8, chloride 107, CO2 23, glucose 112, BUN 20, creatinine 1.8, LFTs normal, poc opponent 0.00, white count 6100 with normal differential, hemoglobin 13.8, platelets 186,000 Medications and treatments: Sublingual nitroglycerin 0.41, morphine 2 mg IV 1, Zofran 4 mg IV 1, nitroglycerin paste 1 inch 1  Review of Systems:  In addition to the HPI above,  No Fever-chills, myalgias or other constitutional symptoms No Headache, changes with Vision or hearing, new weakness, tingling, numbness in any extremity, dizziness, dysarthria or word finding difficulty, gait disturbance or imbalance, tremors or seizure activity No problems swallowing food or Liquids, indigestion/reflux, choking or coughing while eating, abdominal pain with or after eating No Cough or Shortness of Breath, orthopnea or DOE No Abdominal pain, N/V, melena,hematochezia, dark tarry stools, constipation No dysuria, malodorous urine, hematuria or flank pain No new skin rashes, lesions, masses or bruises, No new joint pains, aches, swelling or redness No recent unintentional weight gain or loss No polyuria, polydypsia or polyphagia   Past Medical History:  Diagnosis Date  . AAA 09/19/2008  . Allergy   . ANEMIA-IRON DEFICIENCY 09/19/2008  . ANXIETY 06/13/2010  . Arthritis   . Atrial fibrillation ([place]) 04/19/2008   amiodarone rx;  Echocardiogram 05/26/11: No wall motion abnormalities, mild LVH, EF 60%.  . Atrial flutter ([place]) 12/05/2008   s/p RFCA  . Benign esophageal stricture 09/2075   dilated during EGD  . BRADYCARDIA 12/05/2008  . CAD 12/05/2008  s/p CABG; NSTEMI 05/2011 - LHC 05/25/11: LAD occluded, LIMA-LAD patent, ostial circumflex 50%, AV circumflex 70%, SVG-OM2 with extensive disease with thrombus (culprit vessel), RCA 80% and occluded, SVG-intermediate patent,  SVG-PDA/PL A patent.  Given that the culprit vessel was a subtotally occluded heavily thrombotic graft to a smaller OM2, medical therapy was recommended.;   . CKD (chronic kidney disease) 09/19/2008   Qualifier: Diagnosis of  By: Jenny Reichmann MD, [place], HX OF 09/19/2008   adenomatous polyps 07/2010  . COPD (chronic obstructive pulmonary disease) ([place])   . CORONARY ARTERY BYPASS GRAFT, HX OF 12/05/2008   A. LIMA-LAD, VG-RI, VG-OM2, VG-RPD/RPL;  B. 05/2011 - NSTEMI - CATH WITH 4/5 PATENT GRAFTS AND NEW THROMBUS IN DISTAL VG-OM2 - MED RX  . DIVERTICULOSIS, COLON 09/19/2008  . Erectile dysfunction 09/13/2012  . GASTROINTESTINAL HEMORRHAGE, HX OF 04/19/2008  . GERD 09/19/2008  . GOUT 09/19/2008  . HYPERLIPIDEMIA 09/19/2008  . HYPERTENSION 09/19/2008  . Hypothyroidism 12/05/2008  . LOW BACK PAIN 09/19/2008  . Myocardial infarction   . NEPHROLITHIASIS, HX OF 01/11/2009  . PERIPHERAL VASCULAR DISEASE 09/19/2008  . PSA, INCREASED 09/19/2008  . RENAL INSUFFICIENCY 09/19/2008  . Unstable angina pectoris ([place]) 05/2015   Cath 02/02 w/ patent LIMA-LAD, SVG-D1, SVG-RCA, severe dz in SVG-OM, med rx    Past Surgical History:  Procedure Laterality Date  . CARDIAC CATHETERIZATION  05/25/2011  . CARDIAC CATHETERIZATION N/A 06/20/2015   Procedure: Coronary/Graft Angiography;  Surgeon: Peter M Martinique, MD; LAD, CFX & RCA 100%; LIMA-LAD, SVG-D1 & SVG-RCA OK; SVG-OM severe dz, med rx   . COLON SURGERY    . CORONARY ARTERY BYPASS GRAFT  2004 X 5  . ENTEROSCOPY N/A 02/13/2014   Procedure: ENTEROSCOPY;  Surgeon: [name], MD;  Location: [facility];  Service: Gastroenterology;  Laterality: N/A;  super slim  . ESOPHAGOGASTRODUODENOSCOPY  04/28/2012   Procedure: ESOPHAGOGASTRODUODENOSCOPY (EGD);  Surgeon: [name], MD;  Location: [facility];  Service: Endoscopy;  Laterality: N/A;  . LEFT HEART CATHETERIZATION WITH CORONARY/GRAFT ANGIOGRAM  05/25/2011   Procedure: LEFT HEART CATHETERIZATION WITH [name];  Surgeon: [name], MD;  Location: [facility] CATH LAB;  Service: Cardiovascular;;  . s/p afib ablation  2004    Social History   Social History  . Marital status: Married    Spouse name: [name]  . Number of children: N/A  . Years of education: N/A   Occupational History  . 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 Smoking status: Former Smoker    Packs/day: 4.00    Years: 31.00    Types: Cigarettes    Quit date: 03/19/1983  . Smokeless tobacco: Never Used  . Alcohol use 6.0 oz/week    10 Shots of liquor per week  . Drug use: No  . Sexual activity: Yes   Other Topics Concern  . Not on file   Social History Narrative   Married lives with his wife    Mobility: Without assistive devices Work history: Not obtained   Allergies  Allergen Reactions  . Bee Venom Anaphylaxis    Only yellow jackets   . Promethazine Hcl Other (See Comments)    syncope  . Doxycycline Hyclate Other (See Comments)    esophagitis  . Lipitor [Atorvastatin Calcium] Other (See Comments)    Myalgia/myopathy  . Other Diarrhea and Nausea And Vomiting    Seafood  . Tetracycline Other (See Comments)    Esophagitis  .  Crestor [Rosuvastatin] Other (See Comments)    Myalgia/myopathy  . Tetanus Toxoid Rash    Family History  Problem Relation Age of Onset  . Multiple myeloma Mother   . Cancer Mother   . Other Mother     varicose veins  . Diabetes Father   . Diabetes Sister   . Heart disease Paternal Uncle   . Heart disease Maternal Uncle   . Lymphoma Sister     half-sister  . Heart attack Maternal Uncle   . Heart attack Paternal Grandmother   . Heart attack Paternal Grandfather   . Hypertension Neg Hx   . Stroke Neg Hx      Prior to Admission medications   Medication Sig Start Date End Date Taking? Authorizing Provider  albuterol (PROVENTIL HFA;VENTOLIN HFA) 108 (90 Base) MCG/ACT inhaler Inhale 2 puffs into the lungs every 6 (six) hours as  needed for wheezing or shortness of breath. Reported on 08/09/2015 06/18/16  Yes Biagio Borg, MD  allopurinol (ZYLOPRIM) 300 MG tablet Take 1 tablet (300 mg total) by mouth daily. 06/18/16  Yes Biagio Borg, MD  amiodarone (PACERONE) 200 MG tablet Take 0.5 tablets (100 mg total) by mouth daily. 06/18/16  Yes Biagio Borg, MD  aspirin 81 MG tablet Take 162 mg by mouth daily.   Yes Historical Provider, MD  b complex vitamins tablet Take 1 tablet by mouth daily.   Yes Historical Provider, MD  Cholecalciferol (VITAMIN D) 2000 UNITS CAPS Take 2,000 Units by mouth daily.    Yes Historical Provider, MD  EPINEPHrine (EPIPEN) 0.3 mg/0.3 mL SOAJ injection Inject 0.3 mLs (0.3 mg total) into the muscle once. 02/01/13  Yes Gay Filler Copland, MD  levothyroxine (SYNTHROID, LEVOTHROID) 125 MCG tablet Take 1 tablet (125 mcg total) by mouth daily. 06/18/16  Yes Biagio Borg, MD  MAGNESIUM PO Take 1 tablet by mouth daily.    Yes Historical Provider, MD  Multiple Vitamin (MULTIVITAMIN) tablet Take 1 tablet by mouth at bedtime.    Yes Historical Provider, MD  nitroGLYCERIN (NITROSTAT) 0.4 MG SL tablet PLACE 1 TABLET UNDER THE TONGUE EVERY 5 MINUTES AS NEEDED FOR CHEST PAIN FOR UP TO 3 DOSES. 09/05/15  Yes Biagio Borg, MD  pantoprazole (PROTONIX) 40 MG tablet Take 1 tablet (40 mg total) by mouth daily. Yearly physical due in February must see MD for refills 06/18/16  Yes Biagio Borg, MD  tiotropium (SPIRIVA HANDIHALER) 18 MCG inhalation capsule PLACE 1 CAPSULE (18 MCG TOTAL) INTO INHALER AND INHALE DAILY. 06/18/16  Yes Biagio Borg, MD  vardenafil (LEVITRA) 20 MG tablet Take 1 tablet (20 mg total) by mouth as needed for erectile dysfunction. 06/18/16 06/18/17 Yes Biagio Borg, MD  vitamin C (ASCORBIC ACID) 500 MG tablet Take 500 mg by mouth daily.   Yes Historical Provider, MD    Physical Exam: Vitals:   07/27/16 0645 07/27/16 0700 07/27/16 0715 07/27/16 0730  BP: 132/76 136/71 134/71 152/75  Pulse: (!) 51 (!) 48 (!) 48 (!) 50    Resp: '16 13 15 17  '$ SpO2: 96% 97% 94% 96%  Weight:      Height:          Constitutional: NAD, calm, comfortable Eyes: PERRL, lids and conjunctivae normal ENMT: Mucous membranes are moist. Posterior pharynx clear of any exudate or lesions.Normal dentition.  Neck: normal, supple, no masses, no thyromegaly Respiratory: clear to auscultation bilaterally, no wheezing, no crackles. Normal respiratory effort. No accessory muscle use.  Cardiovascular: Regular rate and rhythm, no murmurs / rubs / gallops. No extremity edema. 2+ pedal pulses. No carotid bruits. Anterior chest wall with reproducible tenderness upon palpation primarily at the [facility] joint space, chest area just below neck without reproducible tenderness Abdomen: no tenderness, no masses palpated. No hepatosplenomegaly. Bowel sounds positive.  Musculoskeletal: no clubbing / cyanosis. No joint deformity upper and lower extremities. Good ROM, no contractures. Normal muscle tone.  Skin: no rashes, lesions, ulcers. No induration Neurologic: CN 2-12 grossly intact. Sensation intact, DTR normal. Strength 5/5 x all 4 extremities.  Psychiatric: Normal judgment and insight. Alert and oriented x 3. Normal mood.    Labs on Admission: I have personally reviewed following labs and imaging studies  CBC:  Recent Labs Lab 07/27/16 0538  WBC 6.1  NEUTROABS 2.9  HGB 13.8  HCT 40.9  MCV 94.9  PLT 186   Basic Metabolic Panel:  Recent Labs Lab 07/27/16 0538  NA 139  K 4.8  CL 107  CO2 23  GLUCOSE 112*  BUN 20  CREATININE 1.81*  CALCIUM 9.4   GFR: Estimated Creatinine Clearance: 33.1 mL/min (by C-G formula based on SCr of 1.81 mg/dL (H)). Liver Function Tests:  Recent Labs Lab 07/27/16 0538  AST 25  ALT 28  ALKPHOS 103  BILITOT 0.6  PROT 7.5  ALBUMIN 3.9   No results for input(s): LIPASE, AMYLASE in the last 168 hours. No results for input(s): AMMONIA in the last 168 hours. Coagulation Profile: No results for input(s):  INR, PROTIME in the last 168 hours. Cardiac Enzymes: No results for input(s): CKTOTAL, CKMB, CKMBINDEX, TROPONINI in the last 168 hours. BNP (last 3 results) No results for input(s): PROBNP in the last 8760 hours. HbA1C: No results for input(s): HGBA1C in the last 72 hours. CBG: No results for input(s): GLUCAP in the last 168 hours. Lipid Profile: No results for input(s): CHOL, HDL, LDLCALC, TRIG, CHOLHDL, LDLDIRECT in the last 72 hours. Thyroid Function Tests: No results for input(s): TSH, T4TOTAL, FREET4, T3FREE, THYROIDAB in the last 72 hours. Anemia Panel: No results for input(s): VITAMINB12, FOLATE, FERRITIN, TIBC, IRON, RETICCTPCT in the last 72 hours. Urine analysis:    Component Value Date/Time   COLORURINE YELLOW 06/18/2016 0936   APPEARANCEUR CLEAR 06/18/2016 0936   LABSPEC 1.020 06/18/2016 0936   PHURINE 6.0 06/18/2016 0936   GLUCOSEU NEGATIVE 06/18/2016 0936   HGBUR NEGATIVE 06/18/2016 0936   BILIRUBINUR NEGATIVE 06/18/2016 0936   BILIRUBINUR negative 04/13/2016 1438   KETONESUR NEGATIVE 06/18/2016 0936   PROTEINUR negative 04/13/2016 1438   PROTEINUR NEGATIVE 04/29/2012 0120   UROBILINOGEN 0.2 06/18/2016 0936   NITRITE NEGATIVE 06/18/2016 0936   LEUKOCYTESUR NEGATIVE 06/18/2016 0936   Sepsis Labs: @LABRCNTIP (procalcitonin:4,lacticidven:4) )No results found for this or any previous visit (from the past 240 hour(s)).   Radiological Exams on Admission: Dg Chest Port 1 View  Result Date: 07/27/2016 CLINICAL DATA:  Initial evaluation for acute chest pain. EXAM: PORTABLE CHEST 1 VIEW COMPARISON:  Prior radiograph from 08/27/2015. FINDINGS: Median sternotomy wires underlying CABG markers and surgical clips noted. Cardiac and mediastinal silhouettes are stable, and remain within normal limits. Aortic atherosclerosis. Lungs normally inflated. No focal infiltrates. Mild linear atelectasis/ scarring scarring noted within the left lower lobe. No pulmonary edema or pleural  effusion. No pneumothorax. The No acute osseous abnormality. IMPRESSION: 1. No active cardiopulmonary disease. 2. Mild scarring within the left lung, stable. Electronically Signed   By: 10/27/2015 M.D.   On: 07/27/2016 06:32  EKG: (Independently reviewed) Sinus bradycardia with ventricular rate 51 bpm, QTC 472 ms, no acute ischemic changes, low voltage R waves inferiorly with delayed R-wave progression in septal lateral leads  Assessment/Plan Principal Problem:   Chest pain/Coronary atherosclerosis, CABG 2004 -Patient presents with chest pain is most typical and atypical features for him noting prior cardiac ischemic equivalent was throat discomfort with numbness and tingling of the tongue -Cardiac catheterization February 2017 with severely diseased SVG to OM but unfortunately due to extensive and complex disease was not suitable for PCI and therefore medical therapy recommended -Cycle troponin -Continue low-dose aspirin (h/o GIB) -Continue Nitropaste -Not on statin -No beta blocker secondary to chronic bradycardia -Echocardiogram -No indication at this juncture to consult cardiology (stable cardiac disease based on catheterization  2013 and 2017)-consider consultation if echo with changes in EF or with new regional wall motion abnormalities or if develops positive cardiac enzymes  Active Problems:   Stricture and stenosis of esophagus -Potentially contributing to current symptomatology although patient denies prandial or postprandial symptoms -Continue PPI    Essential hypertension -Currently controlled/not on antihypertensive therapies prior to admission    Atrial fibrillation  -Maintaining sinus bradycardia on amiodarone -No anticoagulation secondary to history of GI bleed/AVMs -Prior ablation    COPD/GOLD 0 -Stable and does not have chronic dyspnea on exertion -Follows with pulmonology especially in setting of requirement for amiodarone -Continue albuterol and  Spiriva -No cough, wheezing or dyspnea on exertion with current symptoms    CKD (chronic kidney disease), stage III -Renal function is stable and at baseline    Hyperlipidemia LDL goal <70 -Not on statin    Iron deficiency anemia -Hemoglobin stable and at baseline    Hypothyroid -Synthroid    Angiodysplasia of small intestine, except duodenum with bleeding -Occurred in setting of Xarelto -Currently denies GI bleeding symptoms      DVT prophylaxis: Lovenox  Code Status: Full Family Communication: Wife Disposition Plan: home Consults called: none     Dayona Shaheen L. ANP-BC [facility] Pager [phone]   If 7PM-7AM, please contact night-coverage [email or web address] Password [facility]  07/27/2016, 8:05 AM

## 2016-07-27 NOTE — ED Triage Notes (Addendum)
Pt from home w/ a complaint of centralized chest pain that has been going on for a week.  Pt reports only SOB but no n/v/d or dizziness. Pressure is not moving anywhere.  Intensity is greater today.  Pt took "4 baby ASA and 2 old nitro" w/ little improvement.

## 2016-07-27 NOTE — Plan of Care (Signed)
Problem: Safety: Goal: Ability to remain free from injury will improve Outcome: Progressing Patient able to ambulate to the restroom independently.

## 2016-07-27 NOTE — ED Provider Notes (Signed)
[facility] DEPT Provider Note   CSN: [id number] Arrival date & time: 07/27/16  1017     History   Chief Complaint Chief Complaint  Patient presents with  . Chest Pain    HPI Omar Benson is a 82 y.o. male.  Patient presents to the emergency department for evaluation of chest pain. Patient has been experiencing intermittent left-sided chest pain all week. He has not, however, noticed any exertional component. Patient does have an extensive coronary artery disease history. He has residual disease that has been medically managed. He has had a previous bypass, most of his bypasses have occluded.  Patient is not experiencing any shortness of breath, nausea or diaphoresis. He is experiencing a pressure and tightness in the left upper chest that is currently a 3 out of 10. He took 4 baby aspirin and 2 nitroglycerin at home with no improvement. There is some radiation to the back. He reports radiation of pain into his back with previous MI. He does, however, feel that this is different than when he had MI in the past.      Past Medical History:  Diagnosis Date  . AAA 09/19/2008  . Allergy   . ANEMIA-IRON DEFICIENCY 09/19/2008  . ANXIETY 06/13/2010  . Arthritis   . Atrial fibrillation ([place]) 04/19/2008   amiodarone rx;  Echocardiogram 05/26/11: No wall motion abnormalities, mild LVH, EF 60%.  . Atrial flutter ([place]) 12/05/2008   s/p RFCA  . Benign esophageal stricture 09/2075   dilated during EGD  . BRADYCARDIA 12/05/2008  . CAD 12/05/2008   s/p CABG; NSTEMI 05/2011 - LHC 05/25/11: LAD occluded, LIMA-LAD patent, ostial circumflex 50%, AV circumflex 70%, SVG-OM2 with extensive disease with thrombus (culprit vessel), RCA 80% and occluded, SVG-intermediate patent, SVG-PDA/PL A patent.  Given that the culprit vessel was a subtotally occluded heavily thrombotic graft to a smaller OM2, medical therapy was recommended.;   . CKD (chronic kidney disease) 09/19/2008   Qualifier: Diagnosis of  By: Jenny Reichmann MD,  [place], HX OF 09/19/2008   adenomatous polyps 07/2010  . COPD (chronic obstructive pulmonary disease) ([place])   . CORONARY ARTERY BYPASS GRAFT, HX OF 12/05/2008   A. LIMA-LAD, VG-RI, VG-OM2, VG-RPD/RPL;  B. 05/2011 - NSTEMI - CATH WITH 4/5 PATENT GRAFTS AND NEW THROMBUS IN DISTAL VG-OM2 - MED RX  . DIVERTICULOSIS, COLON 09/19/2008  . Erectile dysfunction 09/13/2012  . GASTROINTESTINAL HEMORRHAGE, HX OF 04/19/2008  . GERD 09/19/2008  . GOUT 09/19/2008  . HYPERLIPIDEMIA 09/19/2008  . HYPERTENSION 09/19/2008  . Hypothyroidism 12/05/2008  . LOW BACK PAIN 09/19/2008  . Myocardial infarction   . NEPHROLITHIASIS, HX OF 01/11/2009  . PERIPHERAL VASCULAR DISEASE 09/19/2008  . PSA, INCREASED 09/19/2008  . RENAL INSUFFICIENCY 09/19/2008  . Unstable angina pectoris ([place]) 05/2015   Cath 02/02 w/ patent LIMA-LAD, SVG-D1, SVG-RCA, severe dz in SVG-OM, med rx    Patient Active Problem List   Diagnosis Date Noted  . Cough 06/29/2015  . Unstable angina ([place]) 06/19/2015  . Abdominal mass, RUQ (right upper quadrant) 04/22/2015  . Benign paroxysmal positional vertigo 02/25/2015  . Former smoker 06/30/2014  . Acute back pain 05/08/2014  . Angiodysplasia of small intestine, except duodenum with bleeding 02/13/2014  . Acute GI bleeding 02/12/2014  . Acute blood loss anemia 02/12/2014  . Skin lesion of face 12/31/2013  . Enlarged salivary gland 12/31/2013  . Right shoulder pain 06/27/2013  . Rash and nonspecific skin eruption 06/27/2013  . Erectile dysfunction 09/13/2012  .  Acute sinus infection 08/22/2012  . Allergic rhinitis, cause unspecified 08/22/2012  . GI AVM (gastrointestinal arteriovenous vascular malformation) 04/28/2012  . Stricture and stenosis of esophagus 04/28/2012  . GI bleed 04/26/2012  . Non-STEMI (non-ST elevated myocardial infarction) ([place]) 05/24/2011  . COPD GOLD 0  09/08/2010  . Hypothyroid 08/21/2010  . Preventative health care 08/21/2010  . GASTRITIS 07/21/2010  . Anxiety  state 06/13/2010  . NEPHROLITHIASIS, HX OF 01/11/2009  . Coronary atherosclerosis, CABG 2004 12/05/2008  . ATRIAL FLUTTER, CHRONIC 12/05/2008  . Sinus node dysfunction ([place]) 12/05/2008  . Hyperlipidemia LDL goal <70 09/19/2008  . GOUT 09/19/2008  . Iron deficiency anemia 09/19/2008  . Essential hypertension 09/19/2008  . Aneurysm of abdominal vessel ([place]) 09/19/2008  . PERIPHERAL VASCULAR DISEASE 09/19/2008  . GERD 09/19/2008  . DIVERTICULOSIS, COLON 09/19/2008  . CKD (chronic kidney disease) 09/19/2008  . LOW BACK PAIN 09/19/2008  . PSA, INCREASED 09/19/2008  . COLONIC POLYPS, HX OF 09/19/2008  . Atrial fibrillation ([place]) 04/19/2008  . GASTROINTESTINAL HEMORRHAGE, HX OF 04/19/2008    Past Surgical History:  Procedure Laterality Date  . CARDIAC CATHETERIZATION  05/25/2011  . CARDIAC CATHETERIZATION N/A 06/20/2015   Procedure: Coronary/Graft Angiography;  Surgeon: Peter M Martinique, MD; LAD, CFX & RCA 100%; LIMA-LAD, SVG-D1 & SVG-RCA OK; SVG-OM severe dz, med rx   . COLON SURGERY    . CORONARY ARTERY BYPASS GRAFT  2004 X 5  . ENTEROSCOPY N/A 02/13/2014   Procedure: ENTEROSCOPY;  Surgeon: [name], MD;  Location: [facility] ENDOSCOPY;  Service: Gastroenterology;  Laterality: N/A;  super slim  . ESOPHAGOGASTRODUODENOSCOPY  04/28/2012   Procedure: ESOPHAGOGASTRODUODENOSCOPY (EGD);  Surgeon: [name], MD;  Location: [place];  Service: Endoscopy;  Laterality: N/A;  . LEFT HEART CATHETERIZATION WITH CORONARY/GRAFT ANGIOGRAM  05/25/2011   Procedure: LEFT HEART CATHETERIZATION WITH [name];  Surgeon: [name], MD;  Location: [facility] CATH LAB;  Service: Cardiovascular;;  . s/p afib ablation  2004       Home Medications    Prior to Admission medications   Medication Sig Start Date End Date Taking? Authorizing Provider  albuterol (PROVENTIL HFA;VENTOLIN HFA) 108 (90 Base) MCG/ACT inhaler Inhale 2 puffs into the lungs every 6 (six) hours as needed for wheezing or  shortness of breath. Reported on 08/09/2015 06/18/16  Yes Biagio Borg, MD  allopurinol (ZYLOPRIM) 300 MG tablet Take 1 tablet (300 mg total) by mouth daily. 06/18/16  Yes Biagio Borg, MD  amiodarone (PACERONE) 200 MG tablet Take 0.5 tablets (100 mg total) by mouth daily. 06/18/16  Yes Biagio Borg, MD  aspirin 81 MG tablet Take 162 mg by mouth daily.   Yes Historical Provider, MD  b complex vitamins tablet Take 1 tablet by mouth daily.   Yes Historical Provider, MD  Cholecalciferol (VITAMIN D) 2000 UNITS CAPS Take 2,000 Units by mouth daily.    Yes Historical Provider, MD  EPINEPHrine (EPIPEN) 0.3 mg/0.3 mL SOAJ injection Inject 0.3 mLs (0.3 mg total) into the muscle once. 02/01/13  Yes Gay Filler Copland, MD  levothyroxine (SYNTHROID, LEVOTHROID) 125 MCG tablet Take 1 tablet (125 mcg total) by mouth daily. 06/18/16  Yes Biagio Borg, MD  MAGNESIUM PO Take 1 tablet by mouth daily.    Yes Historical Provider, MD  Multiple Vitamin (MULTIVITAMIN) tablet Take 1 tablet by mouth at bedtime.    Yes Historical Provider, MD  nitroGLYCERIN (NITROSTAT) 0.4 MG SL tablet PLACE 1 TABLET UNDER THE TONGUE EVERY 5 MINUTES AS  NEEDED FOR CHEST PAIN FOR UP TO 3 DOSES. 09/05/15  Yes Biagio Borg, MD  pantoprazole (PROTONIX) 40 MG tablet Take 1 tablet (40 mg total) by mouth daily. Yearly physical due in February must see MD for refills 06/18/16  Yes Biagio Borg, MD  tiotropium (SPIRIVA HANDIHALER) 18 MCG inhalation capsule PLACE 1 CAPSULE (18 MCG TOTAL) INTO INHALER AND INHALE DAILY. 06/18/16  Yes Biagio Borg, MD  vardenafil (LEVITRA) 20 MG tablet Take 1 tablet (20 mg total) by mouth as needed for erectile dysfunction. 06/18/16 06/18/17 Yes Biagio Borg, MD  vitamin C (ASCORBIC ACID) 500 MG tablet Take 500 mg by mouth daily.   Yes Historical Provider, MD    Family History Family History  Problem Relation Age of Onset  . Multiple myeloma Mother   . Cancer Mother   . Other Mother     varicose veins  . Diabetes Father   . Diabetes  Sister   . Heart disease Paternal Uncle   . Heart disease Maternal Uncle   . Lymphoma Sister     half-sister  . Heart attack Maternal Uncle   . Heart attack Paternal Grandmother   . Heart attack Paternal Grandfather   . Hypertension Neg Hx   . Stroke Neg Hx     Social History Social History  Substance Use Topics  . Smoking status: Former Smoker    Packs/day: 4.00    Years: 31.00    Types: Cigarettes    Quit date: 03/19/1983  . Smokeless tobacco: Never Used  . Alcohol use 6.0 oz/week    10 Shots of liquor per week     Allergies   Bee venom; Promethazine hcl; Doxycycline hyclate; Lipitor [atorvastatin calcium]; Other; Tetracycline; Crestor [rosuvastatin]; and Tetanus toxoid   Review of Systems Review of Systems  Cardiovascular: Positive for chest pain.  All other systems reviewed and are negative.    Physical Exam Updated Vital Signs BP 139/81   Pulse (!) 57   Resp 12   Ht '5\' 9"'$  (1.753 m)   Wt 180 lb (81.6 kg)   SpO2 94%   BMI 26.58 kg/m   Physical Exam  Constitutional: He is oriented to person, place, and time. He appears well-developed and well-nourished. No distress.  HENT:  Head: Normocephalic and atraumatic.  Right Ear: Hearing normal.  Left Ear: Hearing normal.  Nose: Nose normal.  Mouth/Throat: Oropharynx is clear and moist and mucous membranes are normal.  Eyes: Conjunctivae and EOM are normal. Pupils are equal, round, and reactive to light.  Neck: Normal range of motion. Neck supple.  Cardiovascular: Regular rhythm, S1 normal and S2 normal.  Exam reveals no gallop and no friction rub.   No murmur heard. Pulmonary/Chest: Effort normal and breath sounds normal. No respiratory distress. He exhibits no tenderness.  Abdominal: Soft. Normal appearance and bowel sounds are normal. There is no hepatosplenomegaly. There is no tenderness. There is no rebound, no guarding, no tenderness at McBurney's point and negative Murphy's sign. No hernia.    Musculoskeletal: Normal range of motion.  Neurological: He is alert and oriented to person, place, and time. He has normal strength. No cranial nerve deficit or sensory deficit. Coordination normal. GCS eye subscore is 4. GCS verbal subscore is 5. GCS motor subscore is 6.  Skin: Skin is warm, dry and intact. No rash noted. No cyanosis.  Psychiatric: He has a normal mood and affect. His speech is normal and behavior is normal. Thought content normal.  Nursing note  and vitals reviewed.    ED Treatments / Results  Labs (all labs ordered are listed, but only abnormal results are displayed) Labs Reviewed  COMPREHENSIVE METABOLIC PANEL - Abnormal; Notable for the following:       Result Value   Glucose, Bld 112 (*)    Creatinine, Ser 1.81 (*)    GFR calc non Af Amer 34 (*)    GFR calc Af Amer 39 (*)    All other components within normal limits  CBC WITH DIFFERENTIAL/PLATELET  I-STAT TROPOININ, ED    EKG  EKG Interpretation  Date/Time:  Monday July 27 2016 05:33:11 EDT Ventricular Rate:  56 PR Interval:    QRS Duration: 158 QT Interval:  493 QTC Calculation: 476 R Axis:   -56 Text Interpretation:  Sinus rhythm RBBB and LAFB Confirmed by Betsey Holiday  MD, Jovee Dettinger [phone]) on 07/27/2016 5:44:41 AM       Radiology Dg Chest Port 1 View  Result Date: 07/27/2016 CLINICAL DATA:  Initial evaluation for acute chest pain. EXAM: PORTABLE CHEST 1 VIEW COMPARISON:  Prior radiograph from 08/27/2015. FINDINGS: Median sternotomy wires underlying CABG markers and surgical clips noted. Cardiac and mediastinal silhouettes are stable, and remain within normal limits. Aortic atherosclerosis. Lungs normally inflated. No focal infiltrates. Mild linear atelectasis/ scarring scarring noted within the left lower lobe. No pulmonary edema or pleural effusion. No pneumothorax. The No acute osseous abnormality. IMPRESSION: 1. No active cardiopulmonary disease. 2. Mild scarring within the left lung, stable.  Electronically Signed   By: Jeannine Boga M.D.   On: 07/27/2016 06:32    Procedures Procedures (including critical care time)  Medications Ordered in ED Medications  nitroGLYCERIN (NITROSTAT) SL tablet 0.4 mg (0.4 mg Sublingual Given 07/27/16 0550)  morphine 4 MG/ML injection 2 mg (2 mg Intravenous Given 07/27/16 0637)  ondansetron (ZOFRAN) injection 4 mg (4 mg Intravenous Given 07/27/16 0636)     Initial Impression / Assessment and Plan / ED Course  I have reviewed the triage vital signs and the nursing notes.  Pertinent labs & imaging results that were available during my care of the patient were reviewed by me and considered in my medical decision making (see chart for details).     Patient has been having intermittent chest pain for 1 week. He has a history of extensive coronary artery disease, status post four-vessel bypass with subsequent recurrent occlusions in grafts as well as diffuse disease elsewhere. He has been medically managed by cardiology. Patient experiencing pain between shoulder blades and in the left chest. His wife tells me that all of his heart problems have manifested with pain in the back. He seems to be minimizing some of his symptoms currently.   He had some relief with nitroglycerin but not complete relief. Initial EKG reveals right bundle branch block and left anterior fascicular block. EKG morphology of his right bundle-branch block is much more pronounced than seen previously. A second EKG was performed after he started to have some improvement. The right bundle-branch block morphology seen initially has resolved (? Intermittent RBBB). Based on his extensive history, will require hospitalization for further management.  Final Clinical Impressions(s) / ED Diagnoses   Final diagnoses:  Chest pain, unspecified type    New Prescriptions New Prescriptions   No medications on file     Orpah Greek, MD 07/27/16 [phone]

## 2016-07-28 ENCOUNTER — Telehealth: Payer: Self-pay | Admitting: *Deleted

## 2016-07-28 DIAGNOSIS — I480 Paroxysmal atrial fibrillation: Secondary | ICD-10-CM | POA: Diagnosis not present

## 2016-07-28 DIAGNOSIS — K5521 Angiodysplasia of colon with hemorrhage: Secondary | ICD-10-CM | POA: Diagnosis not present

## 2016-07-28 DIAGNOSIS — R0789 Other chest pain: Secondary | ICD-10-CM | POA: Diagnosis not present

## 2016-07-28 MED ORDER — ACETAMINOPHEN 325 MG PO TABS: 650.0000 mg | ORAL_TABLET | Freq: Four times a day (QID) | ORAL | 0 refills | Status: DC | PRN

## 2016-07-28 NOTE — Progress Notes (Signed)
Patient being discharged home per MD. Chester Holstein, RN doing verbal discharge with patient and wife. A paper copy of discharge instructions given to patient.

## 2016-07-28 NOTE — Discharge Summary (Signed)
SACRAMENTO MONDS KDX:[id number] DOB: 1934-10-04 DOA: 07/27/2016  PCP: [name], MD  Admit date: 07/27/2016  Discharge date: 07/28/2016  Admitted From: Home   Disposition:  Home   Recommendations for Outpatient Follow-up:   Follow up with PCP in 1-2 weeks  PCP Please obtain BMP/CBC, 2 view CXR in 1week,  (see Discharge instructions)   PCP Please follow up on the following pending results: None   Home Health: None   Equipment/Devices: None  Consultations: None Discharge Condition: Stable CODE STATUS:   Full Diet Recommendation: Heart Healthy     Chief Complaint  Patient presents with  . Chest Pain     Brief history of present illness from the day of admission and additional interim summary    Omar Benson is a 82 y.o. male with medical history significant for CAD status post CABG in 2004, cardiac catheterization was completed February 2017 with severely diseased SVG to OM if medical therapy recommended, atrial fibrillation on amiodarone, no anticoagulation secondary to GI bleeding/AVMs small bowel, dyslipidemia, hypothyroidism, hypertension, chronic kidney disease stage III, iron deficiency anemia, reflux and history of esophageal stricture, was admitted for atypical chest pain.                                                                 Hospital Course      Atypical musculoskeletal chest wall pain. In a patient with history of CAD post left heart catheterization in 2017 with recommendations for medical therapy who recently had a bout of influenza infection 2 weeks ago.  Patient ruled out for MI, EKG nonacute, troponin -3 sets, his chest pain was highly atypical, in fact it was reproducible with tender chest wall, he reports that during his influenza infection episode he had vigorous cough lasting 4-5 days.  This pain is residual discomfort from vigorous coughing. I ambulated him in the hallway for 5 minutes without any reproduction of chest discomfort. His chest pain is not related to exertion not associated with shortness of breath or radiation. Her fairly comfortable that this is a noncardiac event. He is completely symptom-free will be discharged home with close PCP follow-up I have also requested him to see his primary cardiologist Dr. Crissie Sickles within a week. When necessary Tylenol will be ordered. We will avoid NSAIDs due to his underlying CK D4.  For his chronic medical issues he will continue his home medications unchanged.  Discharge diagnosis     Principal Problem:   Chest pain Active Problems:   Hyperlipidemia LDL goal <70   Iron deficiency anemia   Essential hypertension   Coronary atherosclerosis, CABG 2004   Atrial fibrillation (HCC)   CKD (chronic kidney disease), stage III   Hypothyroid   COPD GOLD 0    Stricture and stenosis of esophagus  Angiodysplasia of small intestine, except duodenum with bleeding   Atypical chest pain    Discharge instructions    Discharge Instructions    Discharge instructions    Complete by:  As directed    Follow with Primary MD [name], MD and your cardiologist  in 7 days   Get CBC, CMP, 2 view Chest X ray checked  by Primary MD or SNF MD in 5-7 days ( we routinely change or add medications that can affect your baseline labs and fluid status, therefore we recommend that you get the mentioned basic workup next visit with your PCP, your PCP may decide not to get them or add new tests based on their clinical decision)  Activity: As tolerated with Full fall precautions use walker/cane & assistance as needed  Disposition Home    Diet:  Heart Healthy    For Heart failure patients - Check your Weight same time everyday, if you gain over 2 pounds, or you develop in leg swelling, experience more shortness of breath or chest pain, call your  Primary MD immediately. Follow Cardiac Low Salt Diet and 1.5 lit/day fluid restriction.  On your next visit with your primary care physician please Get Medicines reviewed and adjusted.  Please request your Prim.MD to go over all Hospital Tests and Procedure/Radiological results at the follow up, please get all Hospital records sent to your Prim MD by signing hospital release before you go home.  If you experience worsening of your admission symptoms, develop shortness of breath, life threatening emergency, suicidal or homicidal thoughts you must seek medical attention immediately by calling 911 or calling your MD immediately  if symptoms less severe.  You Must read complete instructions/literature along with all the possible adverse reactions/side effects for all the Medicines you take and that have been prescribed to you. Take any new Medicines after you have completely understood and accpet all the possible adverse reactions/side effects.   Do not drive, operate heavy machinery, perform activities at heights, swimming or participation in water activities or provide baby sitting services if your were admitted for syncope or siezures until you have seen by Primary MD or a Neurologist and advised to do so again.  Do not drive when taking Pain medications.    Do not take more than prescribed Pain, Sleep and Anxiety Medications  Special Instructions: If you have smoked or chewed Tobacco  in the last 2 yrs please stop smoking, stop any regular Alcohol  and or any Recreational drug use.  Wear Seat belts while driving.   Please note  You were cared for by a hospitalist during your hospital stay. If you have any questions about your discharge medications or the care you received while you were in the hospital after you are discharged, you can call the unit and asked to speak with the hospitalist on call if the hospitalist that took care of you is not available. Once you are discharged, your primary  care physician will handle any further medical issues. Please note that NO REFILLS for any discharge medications will be authorized once you are discharged, as it is imperative that you return to your primary care physician (or establish a relationship with a primary care physician if you do not have one) for your aftercare needs so that they can reassess your need for medications and monitor your lab values.   Increase activity slowly    Complete by:  As directed       Discharge Medications  Allergies as of 07/28/2016      Reactions   Bee Venom Anaphylaxis   Only yellow jackets    Promethazine Hcl Other (See Comments)   syncope   Doxycycline Hyclate Other (See Comments)   esophagitis   Lipitor [atorvastatin Calcium] Other (See Comments)   Myalgia/myopathy   Other Diarrhea, Nausea And Vomiting   Seafood   Tetracycline Other (See Comments)   Esophagitis   Crestor [rosuvastatin] Other (See Comments)   Myalgia/myopathy   Tetanus Toxoid Rash      Medication List    TAKE these medications   acetaminophen 325 MG tablet Commonly known as:  TYLENOL Take 2 tablets (650 mg total) by mouth every 6 (six) hours as needed for moderate pain.   albuterol 108 (90 Base) MCG/ACT inhaler Commonly known as:  PROVENTIL HFA;VENTOLIN HFA Inhale 2 puffs into the lungs every 6 (six) hours as needed for wheezing or shortness of breath. Reported on 08/09/2015   allopurinol 300 MG tablet Commonly known as:  ZYLOPRIM Take 1 tablet (300 mg total) by mouth daily.   amiodarone 200 MG tablet Commonly known as:  PACERONE Take 0.5 tablets (100 mg total) by mouth daily.   aspirin 81 MG tablet Take 162 mg by mouth daily.   b complex vitamins tablet Take 1 tablet by mouth daily.   EPINEPHrine 0.3 mg/0.3 mL Soaj injection Commonly known as:  EPIPEN Inject 0.3 mLs (0.3 mg total) into the muscle once.   levothyroxine 125 MCG tablet Commonly known as:  SYNTHROID, LEVOTHROID Take 1 tablet (125 mcg  total) by mouth daily.   MAGNESIUM PO Take 1 tablet by mouth daily.   multivitamin tablet Take 1 tablet by mouth at bedtime.   nitroGLYCERIN 0.4 MG SL tablet Commonly known as:  NITROSTAT PLACE 1 TABLET UNDER THE TONGUE EVERY 5 MINUTES AS NEEDED FOR CHEST PAIN FOR UP TO 3 DOSES.   pantoprazole 40 MG tablet Commonly known as:  PROTONIX Take 1 tablet (40 mg total) by mouth daily. Yearly physical due in February must see MD for refills   tiotropium 18 MCG inhalation capsule Commonly known as:  SPIRIVA HANDIHALER PLACE 1 CAPSULE (18 MCG TOTAL) INTO INHALER AND INHALE DAILY.   vardenafil 20 MG tablet Commonly known as:  LEVITRA Take 1 tablet (20 mg total) by mouth as needed for erectile dysfunction.   vitamin C 500 MG tablet Commonly known as:  ASCORBIC ACID Take 500 mg by mouth daily.   Vitamin D 2000 units Caps Take 2,000 Units by mouth daily.       Follow-up Information    [name], MD. Schedule an appointment as soon as possible for a visit in 1 week(s).   Specialties:  Internal Medicine, Radiology Contact information: [address] [place] [address] [phone]        Cristopher Peru, MD. Schedule an appointment as soon as possible for a visit in 1 week(s).   Specialty:  Cardiology Contact information: [address] [place] [address] [phone]           Major procedures and Radiology Reports - PLEASE review detailed and final reports thoroughly  -         Dg Chest Port 1 View  Result Date: 07/27/2016 CLINICAL DATA:  Initial evaluation for acute chest pain. EXAM: PORTABLE CHEST 1 VIEW COMPARISON:  Prior radiograph from 08/27/2015. FINDINGS: Median sternotomy wires underlying CABG markers and surgical clips noted. Cardiac and mediastinal silhouettes are stable, and  remain within normal limits. Aortic atherosclerosis. Lungs normally inflated. No focal infiltrates. Mild linear atelectasis/ scarring scarring noted within  the left lower lobe. No pulmonary edema or pleural effusion. No pneumothorax. The No acute osseous abnormality. IMPRESSION: 1. No active cardiopulmonary disease. 2. Mild scarring within the left lung, stable. Electronically Signed   By: Jeannine Boga M.D.   On: 07/27/2016 06:32    Micro Results     No results found for this or any previous visit (from the past 240 hour(s)).  Today   Subjective    Ann Lions today has no headache,no chest abdominal pain,no new weakness tingling or numbness, feels much better wants to go home today    Objective   Blood pressure 139/81, pulse (!) 54, temperature 97.9 F (36.6 C), temperature source Oral, resp. rate 16, height 5\' 9"  (1.753 m), weight 82.7 kg (182 lb 6.4 oz), SpO2 96 %.   Intake/Output Summary (Last 24 hours) at 07/28/16 1019 Last data filed at 07/28/16 0508  Gross per 24 hour  Intake              480 ml  Output             1150 ml  Net             -670 ml    Exam Awake Alert, Oriented x 3, No new F.N deficits, Normal affect [place].AT,PERRAL Supple Neck,No JVD, No cervical lymphadenopathy appriciated.  Symmetrical Chest wall movement, Good air movement bilaterally, CTAB, Reproducible chest wall discomfort mostly on the right side RRR,No Gallops,Rubs or new Murmurs, No Parasternal Heave +ve B.Sounds, Abd Soft, Non tender, No organomegaly appriciated, No rebound -guarding or rigidity. No Cyanosis, Clubbing or edema, No new Rash or bruise   Data Review   CBC w Diff: Lab Results  Component Value Date   WBC 6.1 07/27/2016   HGB 13.8 07/27/2016   HCT 40.9 07/27/2016   PLT 186 07/27/2016   LYMPHOPCT 35 07/27/2016   MONOPCT 10 07/27/2016   EOSPCT 8 07/27/2016   BASOPCT 1 07/27/2016    CMP: Lab Results  Component Value Date   NA 139 07/27/2016   K 4.8 07/27/2016   CL 107 07/27/2016   CO2 23 07/27/2016   BUN 20 07/27/2016   CREATININE 1.81 (H) 07/27/2016   CREATININE 1.51 (H) 02/11/2015   PROT 7.5 07/27/2016   ALBUMIN  3.9 07/27/2016   BILITOT 0.6 07/27/2016   ALKPHOS 103 07/27/2016   AST 25 07/27/2016   ALT 28 07/27/2016  .   Total Time in preparing paper work, data evaluation and todays exam - 35 minutes  Thurnell Lose M.D on 07/28/2016 at 10:19 AM  [facility]   Office  [phone]

## 2016-07-28 NOTE — Plan of Care (Signed)
Problem: Pain Managment: Goal: General experience of comfort will improve Outcome: Progressing Patient c/o of persistent headache, will hold AM dose of nitro paste and adminster tylenol for some relief. No chest pain noted.

## 2016-07-28 NOTE — Discharge Instructions (Signed)
Follow with Primary MD [name], MD and your cardiologist  in 7 days   Get CBC, CMP, 2 view Chest X ray checked  by Primary MD or SNF MD in 5-7 days ( we routinely change or add medications that can affect your baseline labs and fluid status, therefore we recommend that you get the mentioned basic workup next visit with your PCP, your PCP may decide not to get them or add new tests based on their clinical decision)  Activity: As tolerated with Full fall precautions use walker/cane & assistance as needed  Disposition Home    Diet:  Heart Healthy    For Heart failure patients - Check your Weight same time everyday, if you gain over 2 pounds, or you develop in leg swelling, experience more shortness of breath or chest pain, call your Primary MD immediately. Follow Cardiac Low Salt Diet and 1.5 lit/day fluid restriction.  On your next visit with your primary care physician please Get Medicines reviewed and adjusted.  Please request your Prim.MD to go over all Hospital Tests and Procedure/Radiological results at the follow up, please get all Hospital records sent to your Prim MD by signing hospital release before you go home.  If you experience worsening of your admission symptoms, develop shortness of breath, life threatening emergency, suicidal or homicidal thoughts you must seek medical attention immediately by calling 911 or calling your MD immediately  if symptoms less severe.  You Must read complete instructions/literature along with all the possible adverse reactions/side effects for all the Medicines you take and that have been prescribed to you. Take any new Medicines after you have completely understood and accpet all the possible adverse reactions/side effects.   Do not drive, operate heavy machinery, perform activities at heights, swimming or participation in water activities or provide baby sitting services if your were admitted for syncope or siezures until you have seen by Primary MD  or a Neurologist and advised to do so again.  Do not drive when taking Pain medications.    Do not take more than prescribed Pain, Sleep and Anxiety Medications  Special Instructions: If you have smoked or chewed Tobacco  in the last 2 yrs please stop smoking, stop any regular Alcohol  and or any Recreational drug use.  Wear Seat belts while driving.   Please note  You were cared for by a hospitalist during your hospital stay. If you have any questions about your discharge medications or the care you received while you were in the hospital after you are discharged, you can call the unit and asked to speak with the hospitalist on call if the hospitalist that took care of you is not available. Once you are discharged, your primary care physician will handle any further medical issues. Please note that NO REFILLS for any discharge medications will be authorized once you are discharged, as it is imperative that you return to your primary care physician (or establish a relationship with a primary care physician if you do not have one) for your aftercare needs so that they can reassess your need for medications and monitor your lab values.     Nonspecific Chest Pain Chest pain can be caused by many different conditions. There is always a chance that your pain could be related to something serious, such as a heart attack or a blood clot in your lungs. Chest pain can also be caused by conditions that are not life-threatening. If you have chest pain, it is very important  to follow up with your health care provider. What are the causes? Causes of this condition include:  Heartburn.  Pneumonia or bronchitis.  Anxiety or stress.  Inflammation around your heart (pericarditis) or lung (pleuritis or pleurisy).  A blood clot in your lung.  A collapsed lung (pneumothorax). This can develop suddenly on its own (spontaneous pneumothorax) or from trauma to the chest.  Shingles infection  (varicella-zoster virus).  Heart attack.  Damage to the bones, muscles, and cartilage that make up your chest wall. This can include:  Bruised bones due to injury.  Strained muscles or cartilage due to frequent or repeated coughing or overwork.  Fracture to one or more ribs.  Sore cartilage due to inflammation (costochondritis). What increases the risk? Risk factors for this condition may include:  Activities that increase your risk for trauma or injury to your chest.  Respiratory infections or conditions that cause frequent coughing.  Medical conditions or overeating that can cause heartburn.  Heart disease or family history of heart disease.  Conditions or health behaviors that increase your risk of developing a blood clot.  Having had chicken pox (varicella zoster). What are the signs or symptoms? Chest pain can feel like:  Burning or tingling on the surface of your chest or deep in your chest.  Crushing, pressure, aching, or squeezing pain.  Dull or sharp pain that is worse when you move, cough, or take a deep breath.  Pain that is also felt in your back, neck, shoulder, or arm, or pain that spreads to any of these areas. Your chest pain may come and go, or it may stay constant. How is this diagnosed? Lab tests or other studies may be needed to find the cause of your pain. Your health care provider may have you take a test called an ECG (electrocardiogram). An ECG records your heartbeat patterns at the time the test is performed. You may also have other tests, such as:  Transthoracic echocardiogram (TTE). In this test, sound waves are used to create a picture of the heart structures and to look at how blood flows through your heart.  Transesophageal echocardiogram (TEE).This is a more advanced imaging test that takes images from inside your body. It allows your health care provider to see your heart in finer detail.  Cardiac monitoring. This allows your health care  provider to monitor your heart rate and rhythm in real time.  Holter monitor. This is a portable device that records your heartbeat and can help to diagnose abnormal heartbeats. It allows your health care provider to track your heart activity for several days, if needed.  Stress tests. These can be done through exercise or by taking medicine that makes your heart beat more quickly.  Blood tests.  Other imaging tests. How is this treated? Treatment depends on what is causing your chest pain. Treatment may include:  Medicines. These may include:  Acid blockers for heartburn.  Anti-inflammatory medicine.  Pain medicine for inflammatory conditions.  Antibiotic medicine, if an infection is present.  Medicines to dissolve blood clots.  Medicines to treat coronary artery disease (CAD).  Supportive care for conditions that do not require medicines. This may include:  Resting.  Applying heat or cold packs to injured areas.  Limiting activities until pain decreases. Follow these instructions at home: Medicines   If you were prescribed an antibiotic, take it as told by your health care provider. Do not stop taking the antibiotic even if you start to feel better.  Take  over-the-counter and prescription medicines only as told by your health care provider. Lifestyle   Do not use any products that contain nicotine or tobacco, such as cigarettes and e-cigarettes. If you need help quitting, ask your health care provider.  Do not drink alcohol.  Make lifestyle changes as directed by your health care provider. These may include:  Getting regular exercise. Ask your health care provider to suggest some activities that are safe for you.  Eating a heart-healthy diet. A registered dietitian can help you to learn healthy eating options.  Maintaining a healthy weight.  Managing diabetes, if necessary.  Reducing stress, such as with yoga or relaxation techniques. General instructions    Avoid any activities that bring on chest pain.  If heartburn is the cause for your chest pain, raise (elevate) the head of your bed about 6 inches (15 cm) by putting blocks under the legs. Sleeping with more pillows does not effectively relieve heartburn because it only changes the position of your head.  Keep all follow-up visits as told by your health care provider. This is important. This includes any further testing if your chest pain does not go away. Contact a health care provider if:  Your chest pain does not go away.  You have a rash with blisters on your chest.  You have a fever.  You have chills. Get help right away if:  Your chest pain is worse.  You have a cough that gets worse, or you cough up blood.  You have severe pain in your abdomen.  You have severe weakness.  You faint.  You have sudden, unexplained chest discomfort.  You have sudden, unexplained discomfort in your arms, back, neck, or jaw.  You have shortness of breath at any time.  You suddenly start to sweat, or your skin gets clammy.  You feel nauseous or you vomit.  You suddenly feel light-headed or dizzy.  Your heart begins to beat quickly, or it feels like it is skipping beats. These symptoms may represent a serious problem that is an emergency. Do not wait to see if the symptoms will go away. Get medical help right away. Call your local emergency services (911 in the [place] Do not drive yourself to the hospital. This information is not intended to replace advice given to you by your health care provider. Make sure you discuss any questions you have with your health care provider. Document Released: 02/11/2005 Document Revised: 01/27/2016 Document Reviewed: 01/27/2016 Elsevier Interactive Patient Education  2017 [employer].

## 2016-07-28 NOTE — Care Management Obs Status (Signed)
[facility] NOTIFICATION   Patient Details  Name: ULMER DEGEN MRN: [id number] Date of Birth: [birth year]   Medicare Observation Status Notification Given:  Yes    Bethena Roys, RN 07/28/2016, 10:56 AM

## 2016-07-28 NOTE — Telephone Encounter (Signed)
Transition Care Management Follow-up Telephone Call   Date discharged? 07/28/16   How have you been since you were released from the hospital? Spoke w/wife she states pt his doing ok   Do you understand why you were in the hospital? YES   Do you understand the discharge instructions? YES   Where were you discharged to? Home   Items Reviewed:  Medications reviewed: YES  Allergies reviewed: YES  Dietary changes reviewed: NO  Referrals reviewed: YES, still waiting on appt w/cardiology    Functional Questionnaire:   Activities of Daily Living (ADLs):   She states he are independent in the following: ambulation, bathing and hygiene, feeding, continence, grooming, toileting and dressing States he doesn't require assistance    Any transportation issues/concerns?: NO   Any patient concerns? NO   Confirmed importance and date/time of follow-up visits scheduled YES, appt 08/04/16  Provider Appointment booked with Dr. Jenny Reichmann  Confirmed with patient if condition begins to worsen call PCP or go to the ER.  Patient was given the office number and encouraged to call back with question or concerns.  : YES

## 2016-07-28 NOTE — Plan of Care (Signed)
Problem: Skin Integrity: Goal: Risk for impaired skin integrity will decrease Outcome: Completed/Met Date Met: 07/28/16 Patient has no skin issues.  Problem: Activity: Goal: Risk for activity intolerance will decrease Outcome: Completed/Met Date Met: 07/28/16 Patient ambulates in the hallway independently.

## 2016-08-04 ENCOUNTER — Ambulatory Visit (INDEPENDENT_AMBULATORY_CARE_PROVIDER_SITE_OTHER): Payer: Medicare Other | Admitting: Internal Medicine

## 2016-08-04 ENCOUNTER — Encounter: Payer: Self-pay | Admitting: Internal Medicine

## 2016-08-04 VITALS — BP 130/78 | HR 67 | Temp 98.2°F | Ht 69.0 in | Wt 188.0 lb

## 2016-08-04 DIAGNOSIS — E785 Hyperlipidemia, unspecified: Secondary | ICD-10-CM | POA: Diagnosis not present

## 2016-08-04 DIAGNOSIS — N183 Chronic kidney disease, stage 3 unspecified: Secondary | ICD-10-CM

## 2016-08-04 DIAGNOSIS — R0789 Other chest pain: Secondary | ICD-10-CM | POA: Diagnosis not present

## 2016-08-04 DIAGNOSIS — I10 Essential (primary) hypertension: Secondary | ICD-10-CM | POA: Diagnosis not present

## 2016-08-04 NOTE — Patient Instructions (Addendum)
Please continue all other medications as before, and refills have been done if requested.  Please have the pharmacy call with any other refills you may need.  Please continue your efforts at being more active, low cholesterol diet, and weight control.  Please keep your appointments with your specialists as you may have planned  You will be contacted regarding the referral for: stress test, and Dr Lovena Le, as well as Nephrology (kidney) referral

## 2016-08-04 NOTE — Progress Notes (Signed)
Subjective:    Patient ID: Omar Benson, male    DOB: 1934/07/23, 82 y.o.   MRN: [id number]  HPI  Here with c/o 3 wks intermittent sharp SSCP, mild, without radiation, n/v, diaphoresis, palp, dizziness, but has occas sob as well.  Non positional, nonexertional, nonpleuritic.  Pt denies new neurological symptoms such as new headache, or facial or extremity weakness or numbness   Pt denies polydipsia, polyuria  Pt denies fever, wt loss, night sweats, loss of appetite, or other constitutional symptoms No ST, cough. Past Medical History:  Diagnosis Date  . AAA 09/19/2008  . Allergy   . ANEMIA-IRON DEFICIENCY 09/19/2008  . ANXIETY 06/13/2010  . Arthritis   . Atrial fibrillation ([place]) 04/19/2008   amiodarone rx;  Echocardiogram 05/26/11: No wall motion abnormalities, mild LVH, EF 60%.  . Atrial flutter ([place]) 12/05/2008   s/p RFCA  . Benign esophageal stricture 09/2075   dilated during EGD  . BRADYCARDIA 12/05/2008  . CAD 12/05/2008   s/p CABG; NSTEMI 05/2011 - LHC 05/25/11: LAD occluded, LIMA-LAD patent, ostial circumflex 50%, AV circumflex 70%, SVG-OM2 with extensive disease with thrombus (culprit vessel), RCA 80% and occluded, SVG-intermediate patent, SVG-PDA/PL A patent.  Given that the culprit vessel was a subtotally occluded heavily thrombotic graft to a smaller OM2, medical therapy was recommended.;   . CKD (chronic kidney disease) 09/19/2008   Qualifier: Diagnosis of  By: Jenny Reichmann MD, [place], HX OF 09/19/2008   adenomatous polyps 07/2010  . COPD (chronic obstructive pulmonary disease) ([place])   . CORONARY ARTERY BYPASS GRAFT, HX OF 12/05/2008   A. LIMA-LAD, VG-RI, VG-OM2, VG-RPD/RPL;  B. 05/2011 - NSTEMI - CATH WITH 4/5 PATENT GRAFTS AND NEW THROMBUS IN DISTAL VG-OM2 - MED RX  . DIVERTICULOSIS, COLON 09/19/2008  . Erectile dysfunction 09/13/2012  . GASTROINTESTINAL HEMORRHAGE, HX OF 04/19/2008  . GERD 09/19/2008  . GOUT 09/19/2008  . HYPERLIPIDEMIA 09/19/2008  . HYPERTENSION 09/19/2008  .  Hypothyroidism 12/05/2008  . LOW BACK PAIN 09/19/2008  . Myocardial infarction   . NEPHROLITHIASIS, HX OF 01/11/2009  . PERIPHERAL VASCULAR DISEASE 09/19/2008  . PSA, INCREASED 09/19/2008  . RENAL INSUFFICIENCY 09/19/2008  . Unstable angina pectoris ([place]) 05/2015   Cath 02/02 w/ patent LIMA-LAD, SVG-D1, SVG-RCA, severe dz in SVG-OM, med rx   Past Surgical History:  Procedure Laterality Date  . CARDIAC CATHETERIZATION  05/25/2011  . CARDIAC CATHETERIZATION N/A 06/20/2015   Procedure: Coronary/Graft Angiography;  Surgeon: Peter M Martinique, MD; LAD, CFX & RCA 100%; LIMA-LAD, SVG-D1 & SVG-RCA OK; SVG-OM severe dz, med rx   . COLON SURGERY    . CORONARY ARTERY BYPASS GRAFT  2004 X 5  . ENTEROSCOPY N/A 02/13/2014   Procedure: ENTEROSCOPY;  Surgeon: [name], MD;  Location: [facility] ENDOSCOPY;  Service: Gastroenterology;  Laterality: N/A;  super slim  . ESOPHAGOGASTRODUODENOSCOPY  04/28/2012   Procedure: ESOPHAGOGASTRODUODENOSCOPY (EGD);  Surgeon: [name], MD;  Location: [place];  Service: Endoscopy;  Laterality: N/A;  . LEFT HEART CATHETERIZATION WITH CORONARY/GRAFT ANGIOGRAM  05/25/2011   Procedure: LEFT HEART CATHETERIZATION WITH [name];  Surgeon: [name], MD;  Location: [facility] C[facility] LAB;  Service: Cardiovascular;;  . s/p afib ablation  2004    reports that he quit smoking about 33 years ago. His smoking use included Cigarettes. He has a 124.00 pack-year smoking history. He has never used smokeless tobacco. He reports that he drinks about 6.0 oz of alcohol per week . He reports that  he does not use drugs. family history includes Cancer in his mother; Diabetes in his father and sister; Heart attack in his maternal uncle, paternal grandfather, and paternal grandmother; Heart disease in his maternal uncle and paternal uncle; Lymphoma in his sister; Multiple myeloma in his mother; Other in his mother. Allergies  Allergen Reactions  . Bee Venom Anaphylaxis    Only yellow jackets     . Promethazine Hcl Other (See Comments)    syncope  . Doxycycline Hyclate Other (See Comments)    esophagitis  . Lipitor [Atorvastatin Calcium] Other (See Comments)    Myalgia/myopathy  . Other Diarrhea and Nausea And Vomiting    Seafood  . Tetracycline Other (See Comments)    Esophagitis  . Crestor [Rosuvastatin] Other (See Comments)    Myalgia/myopathy  . Tetanus Toxoid Rash   Current Outpatient Prescriptions on File Prior to Visit  Medication Sig Dispense Refill  . acetaminophen (TYLENOL) 325 MG tablet Take 2 tablets (650 mg total) by mouth every 6 (six) hours as needed for moderate pain. 20 tablet 0  . albuterol (PROVENTIL HFA;VENTOLIN HFA) 108 (90 Base) MCG/ACT inhaler Inhale 2 puffs into the lungs every 6 (six) hours as needed for wheezing or shortness of breath. Reported on 08/09/2015 3 Inhaler 3  . allopurinol (ZYLOPRIM) 300 MG tablet Take 1 tablet (300 mg total) by mouth daily. 90 tablet 3  . amiodarone (PACERONE) 200 MG tablet Take 0.5 tablets (100 mg total) by mouth daily. 45 tablet 0  . aspirin 81 MG tablet Take 162 mg by mouth daily.    Marland Kitchen b complex vitamins tablet Take 1 tablet by mouth daily.    . Cholecalciferol (VITAMIN D) 2000 UNITS CAPS Take 2,000 Units by mouth daily.     Marland Kitchen EPINEPHrine (EPIPEN) 0.3 mg/0.3 mL SOAJ injection Inject 0.3 mLs (0.3 mg total) into the muscle once. 2 Device 2  . levothyroxine (SYNTHROID, LEVOTHROID) 125 MCG tablet Take 1 tablet (125 mcg total) by mouth daily. 90 tablet 3  . MAGNESIUM PO Take 1 tablet by mouth daily.     . Multiple Vitamin (MULTIVITAMIN) tablet Take 1 tablet by mouth at bedtime.     . nitroGLYCERIN (NITROSTAT) 0.4 MG SL tablet PLACE 1 TABLET UNDER THE TONGUE EVERY 5 MINUTES AS NEEDED FOR CHEST PAIN FOR UP TO 3 DOSES. 25 tablet 5  . pantoprazole (PROTONIX) 40 MG tablet Take 1 tablet (40 mg total) by mouth daily. Yearly physical due in February must see MD for refills 90 tablet 3  . tiotropium (SPIRIVA HANDIHALER) 18 MCG  inhalation capsule PLACE 1 CAPSULE (18 MCG TOTAL) INTO INHALER AND INHALE DAILY. 90 capsule 3  . vardenafil (LEVITRA) 20 MG tablet Take 1 tablet (20 mg total) by mouth as needed for erectile dysfunction. 5 tablet 11  . vitamin C (ASCORBIC ACID) 500 MG tablet Take 500 mg by mouth daily.     No current facility-administered medications on file prior to visit.    Review of Systems  Constitutional: Negative for unusual diaphoresis or night sweats HENT: Negative for ear swelling or discharge Eyes: Negative for worsening visual haziness  Respiratory: Negative for choking and stridor.   Gastrointestinal: Negative for distension or worsening eructation Genitourinary: Negative for retention or change in urine volume.  Musculoskeletal: Negative for other MSK pain or swelling Skin: Negative for color change and worsening wound Neurological: Negative for tremors and numbness other than noted  Psychiatric/Behavioral: Negative for decreased concentration or agitation other than above   All other  system neg per pt    Objective:   Physical Exam BP 130/78   Pulse 67   Temp 98.2 F (36.8 C) (Oral)   Ht '5\' 9"'$  (1.753 m)   Wt 188 lb (85.3 kg)   SpO2 96%   BMI 27.76 kg/m  VS noted,  Constitutional: Pt appears in no apparent distress HENT: Head: NCAT.  Right Ear: External ear normal.  Left Ear: External ear normal.  Eyes: . Pupils are equal, round, and reactive to light. Conjunctivae and EOM are normal Neck: Normal range of motion. Neck supple.  Cardiovascular: Normal rate and regular rhythm.   Pulmonary/Chest: Effort normal and breath sounds without rales or wheezing.  Abd:  Soft, NT, ND, + BS Neurological: Pt is alert. Not confused , motor grossly intact Skin: Skin is warm. No rash, no LE edema Psychiatric: Pt behavior is normal. No agitation.  No other exam findings Lab Results  Component Value Date   WBC 6.1 07/27/2016   HGB 13.8 07/27/2016   HCT 40.9 07/27/2016   PLT 186 07/27/2016    GLUCOSE 112 (H) 07/27/2016   CHOL 180 06/18/2016   TRIG 263.0 (H) 06/18/2016   HDL 37.40 (L) 06/18/2016   LDLDIRECT 84.0 06/18/2016   LDLCALC 85 06/20/2015   ALT 28 07/27/2016   AST 25 07/27/2016   NA 139 07/27/2016   K 4.8 07/27/2016   CL 107 07/27/2016   CREATININE 1.81 (H) 07/27/2016   BUN 20 07/27/2016   CO2 23 07/27/2016   TSH 1.19 06/18/2016   PSA 1.73 06/18/2016   INR 1.14 06/20/2015   HGBA1C 5.7 (H) 06/19/2015          Assessment & Plan:

## 2016-08-04 NOTE — Progress Notes (Signed)
Pre visit review using our clinic review tool, if applicable. No additional management support is needed unless otherwise documented below in the visit note. 

## 2016-08-09 NOTE — Assessment & Plan Note (Addendum)
stable overall by history and exam, recent data reviewed with pt, and pt to continue medical treatment as before,  to f/u any worsening symptoms or concerns, for renal referral for ongoing management  Lab Results  Component Value Date   CREATININE 1.81 (H) 07/27/2016

## 2016-08-09 NOTE — Assessment & Plan Note (Signed)
Atypical, ecg declined, ? MSk, for cardiac stress test, also refer to Dr Taylor/card per pt request

## 2016-08-09 NOTE — Assessment & Plan Note (Signed)
stable overall by history and exam, recent data reviewed with pt, and pt to continue medical treatment as before,  to f/u any worsening symptoms or concerns BP Readings from Last 3 Encounters:  08/04/16 130/78  07/28/16 139/81  07/21/16 122/80

## 2016-08-09 NOTE — Assessment & Plan Note (Signed)
stable overall by history and exam, recent data reviewed with pt, and pt to continue medical treatment as before,  to f/u any worsening symptoms or concerns Lab Results  Component Value Date   LDLCALC 85 06/20/2015

## 2016-08-12 ENCOUNTER — Encounter: Payer: Self-pay | Admitting: Internal Medicine

## 2016-08-12 ENCOUNTER — Ambulatory Visit (INDEPENDENT_AMBULATORY_CARE_PROVIDER_SITE_OTHER): Payer: Medicare Other | Admitting: Internal Medicine

## 2016-08-12 VITALS — BP 140/64 | HR 67 | Ht 69.0 in | Wt 186.0 lb

## 2016-08-12 DIAGNOSIS — N183 Chronic kidney disease, stage 3 unspecified: Secondary | ICD-10-CM

## 2016-08-12 DIAGNOSIS — I480 Paroxysmal atrial fibrillation: Secondary | ICD-10-CM | POA: Diagnosis not present

## 2016-08-12 MED ORDER — AMIODARONE HCL 200 MG PO TABS: 100.0000 mg | ORAL_TABLET | Freq: Every day | ORAL | 3 refills | Status: DC

## 2016-08-12 NOTE — Patient Instructions (Signed)
Your physician recommends that you continue on your current medications as directed. Please refer to the Current Medication list given to you today.  You have been referred to [facility], Dr. Marval Regal evaluation.

## 2016-08-12 NOTE — Progress Notes (Signed)
HPI [name] returns today for followup. He is a very pleasant 82 year old man with ischemic heart disease, status post bypass surgery, status post stenting, who also has a history of atrial flutter status post catheter ablation and a history of atrial fibrillation. In the interim, the patient has had problems with palpitations and dyspnea with exertion along with fatigue. He does not have syncope. No angina. The patient c/o pain in his chest associate with sneezing hard. He has point tenderness bilaterally. This has persisted and not associate with activity or exertion.  Allergies  Allergen Reactions  . Bee Venom Anaphylaxis    Only yellow jackets   . Promethazine Hcl Other (See Comments)    syncope  . Doxycycline Hyclate Other (See Comments)    esophagitis  . Lipitor [Atorvastatin Calcium] Other (See Comments)    Myalgia/myopathy  . Other Diarrhea and Nausea And Vomiting    Seafood  . Tetracycline Other (See Comments)    Esophagitis  . Crestor [Rosuvastatin] Other (See Comments)    Myalgia/myopathy  . Tetanus Toxoid Rash     Current Outpatient Prescriptions  Medication Sig Dispense Refill  . acetaminophen (TYLENOL) 325 MG tablet Take 2 tablets (650 mg total) by mouth every 6 (six) hours as needed for moderate pain. 20 tablet 0  . albuterol (PROVENTIL HFA;VENTOLIN HFA) 108 (90 Base) MCG/ACT inhaler Inhale 2 puffs into the lungs every 6 (six) hours as needed for wheezing or shortness of breath. Reported on 08/09/2015 3 Inhaler 3  . allopurinol (ZYLOPRIM) 300 MG tablet Take 1 tablet (300 mg total) by mouth daily. 90 tablet 3  . amiodarone (PACERONE) 200 MG tablet Take 0.5 tablets (100 mg total) by mouth daily. 45 tablet 0  . aspirin 81 MG tablet Take 162 mg by mouth daily.    Marland Kitchen b complex vitamins tablet Take 1 tablet by mouth daily.    . Cholecalciferol (VITAMIN D) 2000 UNITS CAPS Take 2,000 Units by mouth daily.     Marland Kitchen EPINEPHrine (EPIPEN) 0.3 mg/0.3 mL SOAJ injection Inject  0.3 mLs (0.3 mg total) into the muscle once. 2 Device 2  . levothyroxine (SYNTHROID, LEVOTHROID) 125 MCG tablet Take 1 tablet (125 mcg total) by mouth daily. 90 tablet 3  . MAGNESIUM PO Take 1 tablet by mouth daily.     . Multiple Vitamin (MULTIVITAMIN) tablet Take 1 tablet by mouth at bedtime.     . nitroGLYCERIN (NITROSTAT) 0.4 MG SL tablet PLACE 1 TABLET UNDER THE TONGUE EVERY 5 MINUTES AS NEEDED FOR CHEST PAIN FOR UP TO 3 DOSES. 25 tablet 5  . pantoprazole (PROTONIX) 40 MG tablet Take 1 tablet (40 mg total) by mouth daily. Yearly physical due in February must see MD for refills 90 tablet 3  . tiotropium (SPIRIVA HANDIHALER) 18 MCG inhalation capsule PLACE 1 CAPSULE (18 MCG TOTAL) INTO INHALER AND INHALE DAILY. 90 capsule 3  . vardenafil (LEVITRA) 20 MG tablet Take 1 tablet (20 mg total) by mouth as needed for erectile dysfunction. 5 tablet 11  . vitamin C (ASCORBIC ACID) 500 MG tablet Take 500 mg by mouth daily.     No current facility-administered medications for this visit.      Past Medical History:  Diagnosis Date  . AAA 09/19/2008  . Allergy   . ANEMIA-IRON DEFICIENCY 09/19/2008  . ANXIETY 06/13/2010  . Arthritis   . Atrial fibrillation ([place]) 04/19/2008   amiodarone rx;  Echocardiogram 05/26/11: No wall motion abnormalities, mild LVH, EF 60%.  Marland Kitchen  Atrial flutter ([facility]) 12/05/2008   s/p RFCA  . Benign esophageal stricture 09/2075   dilated during EGD  . BRADYCARDIA 12/05/2008  . CAD 12/05/2008   s/p CABG; NSTEMI 05/2011 - LHC 05/25/11: LAD occluded, LIMA-LAD patent, ostial circumflex 50%, AV circumflex 70%, SVG-OM2 with extensive disease with thrombus (culprit vessel), RCA 80% and occluded, SVG-intermediate patent, SVG-PDA/PL A patent.  Given that the culprit vessel was a subtotally occluded heavily thrombotic graft to a smaller OM2, medical therapy was recommended.;   . CKD (chronic kidney disease) 09/19/2008   Qualifier: Diagnosis of  By: Jonny Ruiz MD, Len Blalock   . COLONIC POLYPS, HX OF 09/19/2008    adenomatous polyps 07/2010  . COPD (chronic obstructive pulmonary disease) ([facility])   . CORONARY ARTERY BYPASS GRAFT, HX OF 12/05/2008   A. LIMA-LAD, VG-RI, VG-OM2, VG-RPD/RPL;  B. 05/2011 - NSTEMI - CATH WITH 4/5 PATENT GRAFTS AND NEW THROMBUS IN DISTAL VG-OM2 - MED RX  . DIVERTICULOSIS, COLON 09/19/2008  . Erectile dysfunction 09/13/2012  . GASTROINTESTINAL HEMORRHAGE, HX OF 04/19/2008  . GERD 09/19/2008  . GOUT 09/19/2008  . HYPERLIPIDEMIA 09/19/2008  . HYPERTENSION 09/19/2008  . Hypothyroidism 12/05/2008  . LOW BACK PAIN 09/19/2008  . Myocardial infarction   . NEPHROLITHIASIS, HX OF 01/11/2009  . PERIPHERAL VASCULAR DISEASE 09/19/2008  . PSA, INCREASED 09/19/2008  . RENAL INSUFFICIENCY 09/19/2008  . Unstable angina pectoris ([facility]) 05/2015   Cath 02/02 w/ patent LIMA-LAD, SVG-D1, SVG-RCA, severe dz in SVG-OM, med rx    ROS:   All systems reviewed and negative except as noted in the HPI.   Past Surgical History:  Procedure Laterality Date  . CARDIAC CATHETERIZATION  05/25/2011  . CARDIAC CATHETERIZATION N/A 06/20/2015   Procedure: Coronary/Graft Angiography;  Surgeon: Peter M Swaziland, MD; LAD, CFX & RCA 100%; LIMA-LAD, SVG-D1 & SVG-RCA OK; SVG-OM severe dz, med rx   . COLON SURGERY    . CORONARY ARTERY BYPASS GRAFT  2004 X 5  . ENTEROSCOPY N/A 02/13/2014   Procedure: ENTEROSCOPY;  Surgeon: Beverley Fiedler, MD;  Location: [facility] ENDOSCOPY;  Service: Gastroenterology;  Laterality: N/A;  super slim  . ESOPHAGOGASTRODUODENOSCOPY  04/28/2012   Procedure: ESOPHAGOGASTRODUODENOSCOPY (EGD);  Surgeon: Louis Meckel, MD;  Location[facility] ENDOSCOPY;  Service: Endoscopy;  Laterality: N/A;  . LEFT HEART CATHETERIZATION WITH CORONARY/GRAFT ANGIOGRAM  05/25/2011   Procedure: LEFT HEART CATHETERIZATION WITH Isabel Caprice;  Surgeon: Herby Abraham, MD;  Location: [facility] CATH LAB;  Service: Cardiovascular;;  . s/p afib ablation  2004     Family History  Problem Relation Age of Onset  . Multiple myeloma Mother   . Cancer  Mother   . Other Mother     varicose veins  . Diabetes Father   . Diabetes Sister   . Heart disease Paternal Uncle   . Heart disease Maternal Uncle   . Lymphoma Sister     half-sister  . Heart attack Maternal Uncle   . Heart attack Paternal Grandmother   . Heart attack Paternal Grandfather   . Hypertension Neg Hx   . Stroke Neg Hx      Social History   Social History  . Marital status: Married    Spouse name: Victorino Dike  . Number of children: N/A  . Years of education: N/A   Occupational History  . 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 Smoking status: Former Smoker    Packs/day: 4.00    Years: 31.00    Types:  Cigarettes    Quit date: 03/19/1983  . Smokeless tobacco: Never Used  . Alcohol use 6.0 oz/week    10 Shots of liquor per week  . Drug use: No  . Sexual activity: Yes   Other Topics Concern  . Not on file   Social History Narrative   Married lives with his wife     BP 140/64   Pulse 67   Ht '5\' 9"'$  (1.753 m)   Wt 186 lb (84.4 kg)   SpO2 97%   BMI 27.47 kg/m   Physical Exam:  Well appearing 82 year old man,NAD HEENT: Unremarkable Neck:  6 cm JVD, no thyromegally Back:  No CVA tenderness Lungs:  Clear with no wheezes, rales, or rhonchi. HEART:  Regular brady rhythm, no murmurs, no rubs, no clicks Abd:  soft, positive bowel sounds, no organomegally, no rebound, no guarding Ext:  2 plus pulses, no edema, no cyanosis, no clubbing Skin:  No rashes no nodules Neuro:  CN II through XII intact, motor grossly intact  EKG - sinus bradycardia with left axis deviation   Assess/Plan: 1. CAD - he is currently asymptomatic except for fatigue and sob. No angina. He appears to be stable at this point 2. PAF - he is maintaining NSR very nicely. He will continue amiodarone at low dose. He is not a candidate for systemic anti-coagulation due to his h/o GI bleeding.  3. HTN - his blood pressure is not elevated at home but has  been in the office. 4. CoPD - he will continue his bronchodilators. He has a 30 pack year smoking history. He no longer smokes. He will followup in our pulmonary clinic. 5. Palpitations - unclear if he is having AVWB or PVC's or recurrent atrial fib. He feels like his heart is skipping when he exercises. He will wore a 48 hour holter monitor with no sustained arrhythmias. He has PAC's and PVC"s.  Omar Benson.D.

## 2016-09-10 ENCOUNTER — Encounter: Payer: Self-pay | Admitting: Internal Medicine

## 2016-09-11 ENCOUNTER — Encounter: Payer: Self-pay | Admitting: Internal Medicine

## 2016-09-11 ENCOUNTER — Ambulatory Visit (INDEPENDENT_AMBULATORY_CARE_PROVIDER_SITE_OTHER): Payer: Medicare Other | Admitting: Internal Medicine

## 2016-09-11 DIAGNOSIS — K5792 Diverticulitis of intestine, part unspecified, without perforation or abscess without bleeding: Secondary | ICD-10-CM | POA: Diagnosis not present

## 2016-09-11 MED ORDER — AMOXICILLIN-POT CLAVULANATE 875-125 MG PO TABS: 1.0000 | ORAL_TABLET | Freq: Two times a day (BID) | ORAL | 0 refills | Status: DC

## 2016-09-11 NOTE — Progress Notes (Signed)
Pre visit review using our clinic review tool, if applicable. No additional management support is needed unless otherwise documented below in the visit note. 

## 2016-09-11 NOTE — Progress Notes (Signed)
   Subjective:    Patient ID: Omar Benson, male    DOB: 10-31-1934, 82 y.o.   MRN: [id number]  HPI The patient is a 82 YO male coming in for concern for diverticulitis. He has had this several times in the past. He has typical symptoms this time including left lower abdomen pain, diarrhea. Denies fevers but having chills last night. He is taking tylenol for the pain and this is helping so far. He knows that if he waits this will worsen. Has been treated the last time with augmentin which was helpful. Thinks he had a poor reaction with flagyl in the past but cannot recall what that was. Started yesterday and has worsened slightly today.   Review of Systems  Constitutional: Positive for activity change, appetite change and chills. Negative for fatigue, fever and unexpected weight change.  Respiratory: Negative.   Cardiovascular: Negative.   Gastrointestinal: Positive for abdominal pain and diarrhea. Negative for abdominal distention, anal bleeding, blood in stool, constipation, nausea and vomiting.  Musculoskeletal: Negative.   Skin: Negative.   Neurological: Negative.       Objective:   Physical Exam  Constitutional: He is oriented to person, place, and time. He appears well-developed and well-nourished.  HENT:  Head: Normocephalic and atraumatic.  Eyes: EOM are normal.  Cardiovascular: Normal rate and regular rhythm.   Pulmonary/Chest: Effort normal and breath sounds normal.  Abdominal: Soft. He exhibits distension. He exhibits no mass. There is tenderness. There is no rebound and no guarding.  LLQ pain, no rebound, mild guarding in the LLQ only.   Neurological: He is alert and oriented to person, place, and time.  Skin: Skin is warm and dry.   Vitals:   09/11/16 1612  BP: (!) 144/92  Pulse: (!) 55  Temp: 98.6 F (37 C)  TempSrc: Oral  SpO2: 97%  Weight: 188 lb 12 oz (85.6 kg)  Height: 5\' 9"  (1.753 m)      Assessment & Plan:

## 2016-09-11 NOTE — Patient Instructions (Addendum)
We have sent in the augmentin to take 1 pill twice a day for 1 week to help the symptoms.    Diverticulitis Diverticulitis is when small pockets in your large intestine (colon) get infected or swollen. This causes stomach pain and watery poop (diarrhea). These pouches are called diverticula. They form in people who have a condition called diverticulosis. Follow these instructions at home: Medicines   Take over-the-counter and prescription medicines only as told by your doctor. These include:  Antibiotics.  Pain medicines.  Fiber pills.  Probiotics.  Stool softeners.  Do not drive or use heavy machinery while taking prescription pain medicine.  If you were prescribed an antibiotic, take it as told. Do not stop taking it even if you feel better. General instructions   Follow a diet as told by your doctor.  When you feel better, your doctor may tell you to change your diet. You may need to eat a lot of fiber. Fiber makes it easier to poop (have bowel movements). Healthy foods with fiber include:  Berries.  Beans.  Lentils.  Green vegetables.  Exercise 3 or more times a week. Aim for 30 minutes each time. Exercise enough to sweat and make your heart beat faster.  Keep all follow-up visits as told. This is important. You may need to have an exam of the large intestine. This is called a colonoscopy. Contact a doctor if:  Your pain does not get better.  You have a hard time eating or drinking.  You are not pooping like normal. Get help right away if:  Your pain gets worse.  Your problems do not get better.  Your problems get worse very fast.  You have a fever.  You throw up (vomit) more than one time.  You have poop that is:  Bloody.  Black.  Tarry. Summary  Diverticulitis is when small pockets in your large intestine (colon) get infected or swollen.  Take medicines only as told by your doctor.  Follow a diet as told by your doctor. This information  is not intended to replace advice given to you by your health care provider. Make sure you discuss any questions you have with your health care provider. Document Released: 10/21/2007 Document Revised: 05/21/2016 Document Reviewed: 05/21/2016 Elsevier Interactive Patient Education  2017 [employer].

## 2016-09-13 NOTE — Assessment & Plan Note (Signed)
Suspect flagyl was just an intolerance (possibly to alcohol if not warned about symptoms with co-ingestion). Rx for augmentin and if symptoms are not improving by Monday needs CT abdomen. Continue with tylenol for pain.

## 2016-10-15 DIAGNOSIS — N2581 Secondary hyperparathyroidism of renal origin: Secondary | ICD-10-CM | POA: Diagnosis not present

## 2016-10-15 DIAGNOSIS — J449 Chronic obstructive pulmonary disease, unspecified: Secondary | ICD-10-CM | POA: Diagnosis not present

## 2016-10-15 DIAGNOSIS — I700 Atherosclerosis of aorta: Secondary | ICD-10-CM | POA: Diagnosis not present

## 2016-10-15 DIAGNOSIS — I739 Peripheral vascular disease, unspecified: Secondary | ICD-10-CM | POA: Diagnosis not present

## 2016-10-15 DIAGNOSIS — Z6827 Body mass index (BMI) 27.0-27.9, adult: Secondary | ICD-10-CM | POA: Diagnosis not present

## 2016-10-15 DIAGNOSIS — N183 Chronic kidney disease, stage 3 (moderate): Secondary | ICD-10-CM | POA: Diagnosis not present

## 2016-10-15 DIAGNOSIS — I2510 Atherosclerotic heart disease of native coronary artery without angina pectoris: Secondary | ICD-10-CM | POA: Diagnosis not present

## 2016-10-15 DIAGNOSIS — D509 Iron deficiency anemia, unspecified: Secondary | ICD-10-CM | POA: Diagnosis not present

## 2016-10-22 ENCOUNTER — Ambulatory Visit: Payer: Medicare Other | Admitting: *Deleted

## 2016-10-22 VITALS — Ht 67.0 in | Wt 188.4 lb

## 2016-10-22 DIAGNOSIS — Z86010 Personal history of colonic polyps: Secondary | ICD-10-CM

## 2016-10-22 MED ORDER — SUPREP BOWEL PREP KIT 17.5-3.13-1.6 GM/177ML PO SOLN: 1.0000 | Freq: Once | ORAL | 0 refills | Status: AC

## 2016-10-22 NOTE — Progress Notes (Signed)
Patient denies any allergies to egg or soy products. Patient denies complications with anesthesia/sedation.  Patient denies oxygen use at home and denies diet medications.   Denied pamphlet for colonoscopy.

## 2016-11-05 ENCOUNTER — Encounter: Payer: Self-pay | Admitting: Internal Medicine

## 2016-11-05 ENCOUNTER — Ambulatory Visit (AMBULATORY_SURGERY_CENTER): Payer: Medicare Other | Admitting: Internal Medicine

## 2016-11-05 VITALS — BP 144/75 | HR 45 | Temp 98.2°F | Resp 18 | Ht 67.0 in | Wt 188.0 lb

## 2016-11-05 DIAGNOSIS — I2510 Atherosclerotic heart disease of native coronary artery without angina pectoris: Secondary | ICD-10-CM | POA: Diagnosis not present

## 2016-11-05 DIAGNOSIS — D123 Benign neoplasm of transverse colon: Secondary | ICD-10-CM

## 2016-11-05 DIAGNOSIS — Z86010 Personal history of colonic polyps: Secondary | ICD-10-CM | POA: Diagnosis present

## 2016-11-05 DIAGNOSIS — D122 Benign neoplasm of ascending colon: Secondary | ICD-10-CM | POA: Diagnosis not present

## 2016-11-05 DIAGNOSIS — I4891 Unspecified atrial fibrillation: Secondary | ICD-10-CM | POA: Diagnosis not present

## 2016-11-05 DIAGNOSIS — J449 Chronic obstructive pulmonary disease, unspecified: Secondary | ICD-10-CM | POA: Diagnosis not present

## 2016-11-05 MED ORDER — SODIUM CHLORIDE 0.9 % IV SOLN: 500.0000 mL | INTRAVENOUS | Status: DC

## 2016-11-05 NOTE — Op Note (Signed)
[place] Patient Name: Omar Benson Procedure Date: 11/05/2016 9:11 AM MRN: [id number] Endoscopist: [name] , MD Age: 81 Referring MD:  Date of Birth: 02/09/1935 Gender: Male Account #: [id number] Procedure:                Colonoscopy Indications:              Surveillance: Personal history of adenomatous                            polyps on last colonoscopy > 5 years ago Medicines:                Monitored Anesthesia Care Procedure:                Pre-Anesthesia Assessment:                           - Prior to the procedure, a History and Physical                            was performed, and patient medications and                            allergies were reviewed. The patient's tolerance of                            previous anesthesia was also reviewed. The risks                            and benefits of the procedure and the sedation                            options and risks were discussed with the patient.                            All questions were answered, and informed consent                            was obtained. Prior Anticoagulants: The patient has                            taken no previous anticoagulant or antiplatelet                            agents. ASA Grade Assessment: III - A patient with                            severe systemic disease. After reviewing the risks                            and benefits, the patient was deemed in                            satisfactory condition to undergo the procedure.  After obtaining informed consent, the colonoscope                            was passed under direct vision. Throughout the                            procedure, the patient's blood pressure, pulse, and                            oxygen saturations were monitored continuously. The                            Colonoscope was introduced through the anus and                            advanced to the the cecum,  identified by                            appendiceal orifice and ileocecal valve. The                            colonoscopy was performed without difficulty. The                            patient tolerated the procedure well. The quality                            of the bowel preparation was good. The ileocecal                            valve, appendiceal orifice, and rectum were                            photographed. Scope In: 9:26:16 AM Scope Out: 9:46:15 AM Scope Withdrawal Time: 0 hours 13 minutes 10 seconds  Total Procedure Duration: 0 hours 19 minutes 59 seconds  Findings:                 The digital rectal exam was normal.                           Two sessile polyps were found in the ascending                            colon. The polyps were 4 to 7 mm in size. These                            polyps were removed with a cold snare. Resection                            and retrieval were complete.                           Five sessile polyps were found in the transverse  colon. The polyps were 3 to 6 mm in size. These                            polyps were removed with a cold snare. Resection                            and retrieval were complete.                           Multiple small and large-mouthed diverticula were                            found in the sigmoid colon and descending colon.                           Internal hemorrhoids were found during                            retroflexion. The hemorrhoids were medium-sized. Complications:            No immediate complications. Estimated Blood Loss:     Estimated blood loss was minimal. Impression:               - Two 4 to 7 mm polyps in the ascending colon,                            removed with a cold snare. Resected and retrieved.                           - Five 3 to 6 mm polyps in the transverse colon,                            removed with a cold snare. Resected and retrieved.                            - Moderate diverticulosis in the sigmoid colon and                            in the descending colon.                           - Internal hemorrhoids. Recommendation:           - Patient has a contact number available for                            emergencies. The signs and symptoms of potential                            delayed complications were discussed with the                            patient. Return to normal activities tomorrow.  Written discharge instructions were provided to the                            patient.                           - Resume previous diet.                           - Continue present medications.                           - Await pathology results.                           - No repeat colonoscopy due to age. [name], MD 11/05/2016 9:51:26 AM This report has been signed electronically.

## 2016-11-05 NOTE — Patient Instructions (Signed)
YOU HAD AN ENDOSCOPIC PROCEDURE TODAY AT THE [facility]:   Refer to the procedure report that was given to you for any specific questions about what was found during the examination.  If the procedure report does not answer your questions, please call your gastroenterologist to clarify.  If you requested that your care partner not be given the details of your procedure findings, then the procedure report has been included in a sealed envelope for you to review at your convenience later.  YOU SHOULD EXPECT: Some feelings of bloating in the abdomen. Passage of more gas than usual.  Walking can help get rid of the air that was put into your GI tract during the procedure and reduce the bloating. If you had a lower endoscopy (such as a colonoscopy or flexible sigmoidoscopy) you may notice spotting of blood in your stool or on the toilet paper. If you underwent a bowel prep for your procedure, you may not have a normal bowel movement for a few days.  Please Note:  You might notice some irritation and congestion in your nose or some drainage.  This is from the oxygen used during your procedure.  There is no need for concern and it should clear up in a day or so.  SYMPTOMS TO REPORT IMMEDIATELY:   Following lower endoscopy (colonoscopy or flexible sigmoidoscopy):  Excessive amounts of blood in the stool  Significant tenderness or worsening of abdominal pains  Swelling of the abdomen that is new, acute  Fever of 100F or higher  For urgent or emergent issues, a gastroenterologist can be reached at any hour by calling [phone].   DIET:  We do recommend a small meal at first, but then you may proceed to your regular diet.  Drink plenty of fluids but you should avoid alcoholic beverages for 24 hours.  MEDICATIONS: Continue present medications.  Please see handouts given to you by your recovery nurse.  ACTIVITY:  You should plan to take it easy for the rest of today and you should NOT  DRIVE or use heavy machinery until tomorrow (because of the sedation medicines used during the test).    FOLLOW UP: Our staff will call the number listed on your records the next business day following your procedure to check on you and address any questions or concerns that you may have regarding the information given to you following your procedure. If we do not reach you, we will leave a message.  However, if you are feeling well and you are not experiencing any problems, there is no need to return our call.  We will assume that you have returned to your regular daily activities without incident.  If any biopsies were taken you will be contacted by phone or by letter within the next 1-3 weeks.  Please call us at [phone] if you have not heard about the biopsies in 3 weeks.   Thank you for allowing us to provide for your healthcare needs today.   SIGNATURES/CONFIDENTIALITY: You and/or your care partner have signed paperwork which will be entered into your electronic medical record.  These signatures attest to the fact that that the information above on your After Visit Summary has been reviewed and is understood.  Full responsibility of the confidentiality of this discharge information lies with you and/or your care-partner. 

## 2016-11-05 NOTE — Progress Notes (Signed)
Called to room to assist during endoscopic procedure.  Patient ID and intended procedure confirmed with present staff. Received instructions for my participation in the procedure from the performing physician.  

## 2016-11-05 NOTE — Progress Notes (Signed)
Report given to PACU, vss 

## 2016-11-06 ENCOUNTER — Telehealth: Payer: Self-pay | Admitting: *Deleted

## 2016-11-06 NOTE — Telephone Encounter (Signed)
  Follow up Call-  Call back number 11/05/2016  Post procedure Call Back phone  # [phone]  Permission to leave phone message Yes  Some recent data might be hidden     Patient questions:  Do you have a fever, pain , or abdominal swelling? No. Pain Score  0 *  Have you tolerated food without any problems? Yes.    Have you been able to return to your normal activities? Yes.    Do you have any questions about your discharge instructions: Diet   No. Medications  No. Follow up visit  No.  Do you have questions or concerns about your Care? No.  Actions: * If pain score is 4 or above: No action needed, pain <4.

## 2016-11-12 ENCOUNTER — Encounter: Payer: Self-pay | Admitting: Internal Medicine

## 2016-12-16 ENCOUNTER — Ambulatory Visit (INDEPENDENT_AMBULATORY_CARE_PROVIDER_SITE_OTHER): Payer: Medicare Other | Admitting: Internal Medicine

## 2016-12-16 ENCOUNTER — Encounter: Payer: Self-pay | Admitting: Internal Medicine

## 2016-12-16 VITALS — BP 118/78 | HR 52 | Ht 67.0 in | Wt 178.0 lb

## 2016-12-16 DIAGNOSIS — E038 Other specified hypothyroidism: Secondary | ICD-10-CM

## 2016-12-16 DIAGNOSIS — N183 Chronic kidney disease, stage 3 unspecified: Secondary | ICD-10-CM

## 2016-12-16 DIAGNOSIS — R739 Hyperglycemia, unspecified: Secondary | ICD-10-CM | POA: Diagnosis not present

## 2016-12-16 DIAGNOSIS — I10 Essential (primary) hypertension: Secondary | ICD-10-CM | POA: Diagnosis not present

## 2016-12-16 DIAGNOSIS — E785 Hyperlipidemia, unspecified: Secondary | ICD-10-CM | POA: Diagnosis not present

## 2016-12-16 LAB — POCT GLYCOSYLATED HEMOGLOBIN (HGB A1C): HEMOGLOBIN A1C: 5.3

## 2016-12-16 NOTE — Progress Notes (Signed)
Subjective:    Patient ID: Omar Benson, male    DOB: 17-May-1935, 82 y.o.   MRN: [id number]  HPI   Here to f/u; overall doing ok,  Pt denies chest pain, increasing sob or doe, wheezing, orthopnea, PND, increased LE swelling, palpitations, dizziness or syncope.  Pt denies new neurological symptoms such as new headache, or facial or extremity weakness or numbness.  Pt denies polydipsia, polyuria, or low sugar episode.  Pt states overall good compliance with meds, mostly trying to follow appropriate diet, with wt overall stable.  Denies hyper or hypo thyroid symptoms such as voice, skin or hair change. Wt Readings from Last 3 Encounters:  12/16/16 178 lb (80.7 kg)  11/05/16 188 lb (85.3 kg)  10/22/16 188 lb 6.4 oz (85.5 kg)   Past Medical History:  Diagnosis Date  . AAA 09/19/2008  . Allergy   . ANEMIA-IRON DEFICIENCY 09/19/2008  . ANXIETY 06/13/2010  . Arthritis    no meds - knee/back  . Atrial fibrillation ([place]) 04/19/2008   amiodarone rx;  Echocardiogram 05/26/11: No wall motion abnormalities, mild LVH, EF 60%.  . Atrial flutter ([place]) 12/05/2008   s/p RFCA - ablation  . Benign esophageal stricture 09/2075   dilated during EGD  . Blood transfusion without reported diagnosis 2013   [facility] - 2 units transfused  . BRADYCARDIA 12/05/2008  . CAD 12/05/2008   s/p CABG; NSTEMI 05/2011 - LHC 05/25/11: LAD occluded, LIMA-LAD patent, ostial circumflex 50%, AV circumflex 70%, SVG-OM2 with extensive disease with thrombus (culprit vessel), RCA 80% and occluded, SVG-intermediate patent, SVG-PDA/PL A patent.  Given that the culprit vessel was a subtotally occluded heavily thrombotic graft to a smaller OM2, medical therapy was recommended.;   . Cataract    left - small - no treatment yet  . CKD (chronic kidney disease) 09/19/2008   Qualifier: Diagnosis of  By: Jenny Reichmann MD, Hunt Oris  -- Stage 3  . COLONIC POLYPS, HX OF 09/19/2008   adenomatous polyps 07/2010  . COPD (chronic obstructive pulmonary disease) ([place])   .  CORONARY ARTERY BYPASS GRAFT, HX OF 12/05/2008   A. LIMA-LAD, VG-RI, VG-OM2, VG-RPD/RPL;  B. 05/2011 - NSTEMI - CATH WITH 4/5 PATENT GRAFTS AND NEW THROMBUS IN DISTAL VG-OM2 - MED RX  . DIVERTICULOSIS, COLON 09/19/2008  . Erectile dysfunction 09/13/2012  . GASTROINTESTINAL HEMORRHAGE, HX OF 04/19/2008  . GERD 09/19/2008  . GOUT 09/19/2008  . HYPERLIPIDEMIA 09/19/2008   no meds - diet controlled  . HYPERTENSION 09/19/2008   patient denies this dx - no meds for HTN  . Hypothyroidism 12/05/2008  . LOW BACK PAIN 09/19/2008  . Myocardial infarction ([place]) 2013, 2017   both minor - left anterior vessel  . NEPHROLITHIASIS, HX OF 01/11/2009  . PERIPHERAL VASCULAR DISEASE 09/19/2008  . PSA, INCREASED 09/19/2008  . RENAL INSUFFICIENCY 09/19/2008  . Unstable angina pectoris ([place]) 05/2015   Cath 02/02 w/ patent LIMA-LAD, SVG-D1, SVG-RCA, severe dz in SVG-OM, med rx   Past Surgical History:  Procedure Laterality Date  . CARDIAC CATHETERIZATION  05/25/2011  . CARDIAC CATHETERIZATION N/A 06/20/2015   Procedure: Coronary/Graft Angiography;  Surgeon: Peter M Martinique, MD; LAD, CFX & RCA 100%; LIMA-LAD, SVG-D1 & SVG-RCA OK; SVG-OM severe dz, med rx   . COLONOSCOPY     several - polyps  . CORONARY ARTERY BYPASS GRAFT  2004 X 5  . ENTEROSCOPY N/A 02/13/2014   Procedure: ENTEROSCOPY;  Surgeon: [name], MD;  Location[facility];  Service: Gastroenterology;  Laterality:  N/A;  super slim  . ESOPHAGOGASTRODUODENOSCOPY  04/28/2012   Procedure: ESOPHAGOGASTRODUODENOSCOPY (EGD);  Surgeon: [name], MD;  Location: [place];  Service: Endoscopy;  Laterality: N/A;  . KNEE SURGERY Left   . LEFT HEART CATHETERIZATION WITH CORONARY/GRAFT ANGIOGRAM  05/25/2011   Procedure: LEFT HEART CATHETERIZATION WITH [name];  Surgeon: [name], MD;  Location: [facility] C[facility] LAB;  Service: Cardiovascular;;  . MULTIPLE TOOTH EXTRACTIONS    . s/p afib ablation  2004    reports that he quit smoking about 33 years ago. His  smoking use included Cigarettes. He has a 124.00 pack-year smoking history. He has never used smokeless tobacco. He reports that he drinks about 12.6 - 16.8 oz of alcohol per week . He reports that he does not use drugs. family history includes Cancer in his mother; Diabetes in his father and sister; Heart attack in his maternal uncle, paternal grandfather, and paternal grandmother; Heart disease in his maternal uncle and paternal uncle; Lymphoma in his sister; Multiple myeloma in his mother; Other in his mother. Allergies  Allergen Reactions  . Bee Venom Anaphylaxis    Only yellow jackets   . Promethazine Hcl Other (See Comments)    syncope  . Doxycycline Hyclate Other (See Comments)    esophagitis  . Lipitor [Atorvastatin Calcium] Other (See Comments)    Myalgia/myopathy  . Other Diarrhea and Nausea And Vomiting    Seafood  . Tetracycline Other (See Comments)    Esophagitis  . Crestor [Rosuvastatin] Other (See Comments)    Myalgia/myopathy  . Tetanus Toxoid Rash   Current Outpatient Prescriptions on File Prior to Visit  Medication Sig Dispense Refill  . acetaminophen (TYLENOL) 325 MG tablet Take 2 tablets (650 mg total) by mouth every 6 (six) hours as needed for moderate pain. 20 tablet 0  . albuterol (PROVENTIL HFA;VENTOLIN HFA) 108 (90 Base) MCG/ACT inhaler Inhale 2 puffs into the lungs every 6 (six) hours as needed for wheezing or shortness of breath. Reported on 08/09/2015 3 Inhaler 3  . allopurinol (ZYLOPRIM) 300 MG tablet Take 1 tablet (300 mg total) by mouth daily. (Patient taking differently: Take 300 mg by mouth at bedtime. ) 90 tablet 3  . amiodarone (PACERONE) 200 MG tablet Take 0.5 tablets (100 mg total) by mouth daily. (Patient taking differently: Take 100 mg by mouth at bedtime. ) 45 tablet 3  . aspirin 81 MG tablet Take 162 mg by mouth at bedtime.     Marland Kitchen b complex vitamins tablet Take 1 tablet by mouth at bedtime.     . Cholecalciferol (VITAMIN D) 2000 UNITS CAPS Take 2,000  Units by mouth at bedtime.     Marland Kitchen EPINEPHrine (EPIPEN) 0.3 mg/0.3 mL SOAJ injection Inject 0.3 mLs (0.3 mg total) into the muscle once. 2 Device 2  . levothyroxine (SYNTHROID, LEVOTHROID) 125 MCG tablet Take 1 tablet (125 mcg total) by mouth daily. 90 tablet 3  . MAGNESIUM PO Take 1 tablet by mouth at bedtime.     . Multiple Vitamin (MULTIVITAMIN) tablet Take 1 tablet by mouth at bedtime.     . nitroGLYCERIN (NITROSTAT) 0.4 MG SL tablet PLACE 1 TABLET UNDER THE TONGUE EVERY 5 MINUTES AS NEEDED FOR CHEST PAIN FOR UP TO 3 DOSES. 25 tablet 5  . pantoprazole (PROTONIX) 40 MG tablet Take 1 tablet (40 mg total) by mouth daily. Yearly physical due in February must see MD for refills (Patient taking differently: Take 40 mg by mouth at bedtime. Yearly physical due  in February must see MD for refills) 90 tablet 3  . tiotropium (SPIRIVA HANDIHALER) 18 MCG inhalation capsule PLACE 1 CAPSULE (18 MCG TOTAL) INTO INHALER AND INHALE DAILY. 90 capsule 3  . vardenafil (LEVITRA) 20 MG tablet Take 1 tablet (20 mg total) by mouth as needed for erectile dysfunction. 5 tablet 11  . vitamin C (ASCORBIC ACID) 500 MG tablet Take 500 mg by mouth at bedtime.      No current facility-administered medications on file prior to visit.    Review of Systems  Constitutional: Negative for other unusual diaphoresis or sweats HENT: Negative for ear discharge or swelling Eyes: Negative for other worsening visual disturbances Respiratory: Negative for stridor or other swelling  Gastrointestinal: Negative for worsening distension or other blood Genitourinary: Negative for retention or other urinary change Musculoskeletal: Negative for other MSK pain or swelling Skin: Negative for color change or other new lesions Neurological: Negative for worsening tremors and other numbness  Psychiatric/Behavioral: Negative for worsening agitation or other fatigue All other system neg per pt    Objective:   Physical Exam BP 118/78   Pulse (!)  52   Ht '5\' 7"'$  (1.702 m)   Wt 178 lb (80.7 kg)   SpO2 98%   BMI 27.88 kg/m  VS noted, not ill appearing Constitutional: Pt appears in NAD HENT: Head: NCAT.  Right Ear: External ear normal.  Left Ear: External ear normal.  Eyes: . Pupils are equal, round, and reactive to light. Conjunctivae and EOM are normal Nose: without d/c or deformity Neck: Neck supple. Gross normal ROM Cardiovascular: Normal rate and regular rhythm.   Pulmonary/Chest: Effort normal and breath sounds without rales or wheezing.  Abd:  Soft, NT, ND, + BS Neurological: Pt is alert. At baseline orientation, motor grossly intact Skin: Skin is warm. No rashes, other new lesions, no LE edema Psychiatric: Pt behavior is normal without agitation  No other exam findings  POCT glycosylated hemoglobin (Hb A1C)  Order: [id number]  Status:  Final result Visible to patient:  No (Not Released) Dx:  Hyperglycemia  Component 10:39  Hemoglobin A1C 5.3            Assessment & Plan:

## 2016-12-16 NOTE — Assessment & Plan Note (Signed)
Overall stable, has f/u with renal < 3 mo, declines other lab today

## 2016-12-16 NOTE — Patient Instructions (Signed)
Your A1c was normal today  Please continue all other medications as before, and refills have been done if requested.  Please have the pharmacy call with any other refills you may need.  Please continue your efforts at being more active, low cholesterol diet, and weight control.  Please keep your appointments with your specialists as you may have planned - Nephrology

## 2016-12-18 NOTE — Assessment & Plan Note (Signed)
stable overall by history and exam, recent data reviewed with pt, and pt to continue medical treatment as before,  to f/u any worsening symptoms or concerns Lab Results  Component Value Date   TSH 1.19 06/18/2016

## 2016-12-18 NOTE — Assessment & Plan Note (Signed)
stable overall by history and exam, recent data reviewed with pt, and pt to continue medical treatment as before,  to f/u any worsening symptoms or concerns BP Readings from Last 3 Encounters:  12/16/16 118/78  11/05/16 (!) 144/75  09/11/16 (!) 144/92

## 2016-12-18 NOTE — Assessment & Plan Note (Signed)
Mild, a1c controlled , cont diet and wt control, to f/u any worsening symptoms or concerns

## 2016-12-18 NOTE — Assessment & Plan Note (Signed)
stable overall by history and exam, recent data reviewed with pt, and pt to continue medical treatment as before,  to f/u any worsening symptoms or concerns Lab Results  Component Value Date   LDLCALC 85 06/20/2015

## 2016-12-19 ENCOUNTER — Inpatient Hospital Stay (HOSPITAL_COMMUNITY)
Admission: EM | Admit: 2016-12-19 | Discharge: 2016-12-22 | DRG: 281 | Disposition: A | Discharge status: Home / Self Care | Payer: Medicare Other | Attending: Internal Medicine | Admitting: Internal Medicine

## 2016-12-19 ENCOUNTER — Emergency Department (HOSPITAL_COMMUNITY): Payer: Medicare Other

## 2016-12-19 ENCOUNTER — Encounter (HOSPITAL_COMMUNITY): Payer: Self-pay

## 2016-12-19 DIAGNOSIS — Z881 Allergy status to other antibiotic agents status: Secondary | ICD-10-CM

## 2016-12-19 DIAGNOSIS — Z8249 Family history of ischemic heart disease and other diseases of the circulatory system: Secondary | ICD-10-CM

## 2016-12-19 DIAGNOSIS — I129 Hypertensive chronic kidney disease with stage 1 through stage 4 chronic kidney disease, or unspecified chronic kidney disease: Secondary | ICD-10-CM | POA: Diagnosis not present

## 2016-12-19 DIAGNOSIS — R9431 Abnormal electrocardiogram [ECG] [EKG]: Secondary | ICD-10-CM | POA: Diagnosis not present

## 2016-12-19 DIAGNOSIS — N183 Chronic kidney disease, stage 3 (moderate): Secondary | ICD-10-CM | POA: Diagnosis present

## 2016-12-19 DIAGNOSIS — Z888 Allergy status to other drugs, medicaments and biological substances status: Secondary | ICD-10-CM

## 2016-12-19 DIAGNOSIS — Z833 Family history of diabetes mellitus: Secondary | ICD-10-CM

## 2016-12-19 DIAGNOSIS — J449 Chronic obstructive pulmonary disease, unspecified: Secondary | ICD-10-CM | POA: Diagnosis present

## 2016-12-19 DIAGNOSIS — I208 Other forms of angina pectoris: Secondary | ICD-10-CM

## 2016-12-19 DIAGNOSIS — R079 Chest pain, unspecified: Secondary | ICD-10-CM | POA: Diagnosis not present

## 2016-12-19 DIAGNOSIS — Z91030 Bee allergy status: Secondary | ICD-10-CM

## 2016-12-19 DIAGNOSIS — Z87891 Personal history of nicotine dependence: Secondary | ICD-10-CM

## 2016-12-19 DIAGNOSIS — Z91038 Other insect allergy status: Secondary | ICD-10-CM

## 2016-12-19 DIAGNOSIS — I252 Old myocardial infarction: Secondary | ICD-10-CM | POA: Diagnosis not present

## 2016-12-19 DIAGNOSIS — Z7982 Long term (current) use of aspirin: Secondary | ICD-10-CM

## 2016-12-19 DIAGNOSIS — Z951 Presence of aortocoronary bypass graft: Secondary | ICD-10-CM

## 2016-12-19 DIAGNOSIS — I214 Non-ST elevation (NSTEMI) myocardial infarction: Principal | ICD-10-CM | POA: Diagnosis present

## 2016-12-19 DIAGNOSIS — I4510 Unspecified right bundle-branch block: Secondary | ICD-10-CM | POA: Diagnosis present

## 2016-12-19 DIAGNOSIS — Z887 Allergy status to serum and vaccine status: Secondary | ICD-10-CM

## 2016-12-19 DIAGNOSIS — E785 Hyperlipidemia, unspecified: Secondary | ICD-10-CM | POA: Diagnosis present

## 2016-12-19 DIAGNOSIS — I4892 Unspecified atrial flutter: Secondary | ICD-10-CM | POA: Diagnosis present

## 2016-12-19 DIAGNOSIS — I200 Unstable angina: Secondary | ICD-10-CM | POA: Diagnosis present

## 2016-12-19 DIAGNOSIS — K219 Gastro-esophageal reflux disease without esophagitis: Secondary | ICD-10-CM | POA: Diagnosis present

## 2016-12-19 DIAGNOSIS — Z91013 Allergy to seafood: Secondary | ICD-10-CM

## 2016-12-19 DIAGNOSIS — Z807 Family history of other malignant neoplasms of lymphoid, hematopoietic and related tissues: Secondary | ICD-10-CM

## 2016-12-19 DIAGNOSIS — I25110 Atherosclerotic heart disease of native coronary artery with unstable angina pectoris: Secondary | ICD-10-CM | POA: Diagnosis not present

## 2016-12-19 DIAGNOSIS — Z87442 Personal history of urinary calculi: Secondary | ICD-10-CM

## 2016-12-19 DIAGNOSIS — M109 Gout, unspecified: Secondary | ICD-10-CM | POA: Diagnosis present

## 2016-12-19 DIAGNOSIS — I495 Sick sinus syndrome: Secondary | ICD-10-CM | POA: Diagnosis present

## 2016-12-19 LAB — CBC
HCT: 41.2 % (ref 39.0–52.0)
HEMATOCRIT: 37.9 % — AB (ref 39.0–52.0)
Hemoglobin: 13.7 g/dL (ref 13.0–17.0)
Hemoglobin: 13 g/dL (ref 13.0–17.0)
MCH: 31.1 pg (ref 26.0–34.0)
MCH: 31.7 pg (ref 26.0–34.0)
MCHC: 33.3 g/dL (ref 30.0–36.0)
MCHC: 34.3 g/dL (ref 30.0–36.0)
MCV: 93.4 fL (ref 78.0–100.0)
MCV: 92.4 fL (ref 78.0–100.0)
PLATELETS: 177 10*3/uL (ref 150–400)
Platelets: 180 10*3/uL (ref 150–400)
RBC: 4.41 MIL/uL (ref 4.22–5.81)
RBC: 4.1 MIL/uL — ABNORMAL LOW (ref 4.22–5.81)
RDW: 13.8 % (ref 11.5–15.5)
RDW: 13.8 % (ref 11.5–15.5)
WBC: 5.9 10*3/uL (ref 4.0–10.5)
WBC: 5.2 10*3/uL (ref 4.0–10.5)

## 2016-12-19 LAB — BASIC METABOLIC PANEL
Anion gap: 6 (ref 5–15)
BUN: 25 mg/dL — AB (ref 6–20)
CALCIUM: 9.6 mg/dL (ref 8.9–10.3)
CO2: 24 mmol/L (ref 22–32)
CREATININE: 1.77 mg/dL — AB (ref 0.61–1.24)
Chloride: 108 mmol/L (ref 101–111)
GFR calc Af Amer: 40 mL/min — ABNORMAL LOW (ref >60)
GFR, EST NON AFRICAN AMERICAN: 35 mL/min — AB (ref >60)
GLUCOSE: 145 mg/dL — AB (ref 65–99)
Potassium: 4.8 mmol/L (ref 3.5–5.1)
Sodium: 138 mmol/L (ref 135–145)

## 2016-12-19 LAB — I-STAT TROPONIN, ED
TROPONIN I, POC: 0 ng/mL (ref 0.00–0.08)
Troponin i, poc: 0.04 ng/mL (ref 0.00–0.08)

## 2016-12-19 LAB — CREATININE, SERUM
Creatinine, Ser: 1.63 mg/dL — ABNORMAL HIGH (ref 0.61–1.24)
GFR calc Af Amer: 44 mL/min — ABNORMAL LOW (ref >60)
GFR calc non Af Amer: 38 mL/min — ABNORMAL LOW (ref >60)

## 2016-12-19 LAB — TROPONIN I
Troponin I: 0.08 ng/mL (ref <0.03)
Troponin I: 0.58 ng/mL (ref <0.03)

## 2016-12-19 MED ORDER — HYDRALAZINE HCL 20 MG/ML IJ SOLN
10.0000 mg | Freq: Three times a day (TID) | INTRAMUSCULAR | Status: DC | PRN
Start: 2016-12-19 — End: 2016-12-21

## 2016-12-19 MED ORDER — ZOLPIDEM TARTRATE 5 MG PO TABS: 5.0000 mg | ORAL_TABLET | Freq: Every evening | ORAL | Status: DC | PRN

## 2016-12-19 MED ORDER — ASPIRIN 81 MG PO CHEW: 324.0000 mg | CHEWABLE_TABLET | Freq: Every day | ORAL | Status: DC

## 2016-12-19 MED ORDER — ACETAMINOPHEN 325 MG PO TABS: 650.0000 mg | ORAL_TABLET | ORAL | Status: DC | PRN

## 2016-12-19 MED ORDER — HEPARIN (PORCINE) IN NACL 100-0.45 UNIT/ML-% IJ SOLN
1100.0000 [IU]/h | INTRAMUSCULAR | Status: DC
  Administered 2016-12-19: 1000 [IU]/h via INTRAVENOUS
  Filled 2016-12-19 (×2): qty 250

## 2016-12-19 MED ORDER — AMIODARONE HCL 100 MG PO TABS
100.0000 mg | ORAL_TABLET | Freq: Every day | ORAL | Status: DC
  Administered 2016-12-19 – 2016-12-21 (×3): 100 mg via ORAL
  Filled 2016-12-19 (×3): qty 1

## 2016-12-19 MED ORDER — ACETAMINOPHEN 325 MG PO TABS
650.0000 mg | ORAL_TABLET | ORAL | Status: DC | PRN
Start: 2016-12-19 — End: 2016-12-22
  Administered 2016-12-21: 650 mg via ORAL
  Filled 2016-12-19: qty 2

## 2016-12-19 MED ORDER — ONDANSETRON HCL 4 MG/2ML IJ SOLN: 4.0000 mg | Freq: Four times a day (QID) | INTRAMUSCULAR | Status: DC | PRN

## 2016-12-19 MED ORDER — NITROGLYCERIN 0.4 MG SL SUBL: 0.4000 mg | SUBLINGUAL_TABLET | SUBLINGUAL | Status: DC | PRN

## 2016-12-19 MED ORDER — MAGNESIUM 250 MG PO TABS: 250.0000 mg | ORAL_TABLET | Freq: Every day | ORAL | Status: DC

## 2016-12-19 MED ORDER — ALLOPURINOL 300 MG PO TABS
300.0000 mg | ORAL_TABLET | Freq: Every day | ORAL | Status: DC
  Administered 2016-12-19 – 2016-12-21 (×3): 300 mg via ORAL
  Filled 2016-12-19 (×3): qty 1

## 2016-12-19 MED ORDER — LEVOTHYROXINE SODIUM 25 MCG PO TABS
125.0000 ug | ORAL_TABLET | Freq: Every day | ORAL | Status: DC
  Administered 2016-12-20 – 2016-12-22 (×3): 125 ug via ORAL
  Filled 2016-12-19 (×3): qty 1

## 2016-12-19 MED ORDER — MAGNESIUM OXIDE 400 (241.3 MG) MG PO TABS
200.0000 mg | ORAL_TABLET | Freq: Every day | ORAL | Status: DC
  Administered 2016-12-19 – 2016-12-21 (×3): 200 mg via ORAL
  Filled 2016-12-19 (×3): qty 1

## 2016-12-19 MED ORDER — PANTOPRAZOLE SODIUM 40 MG PO TBEC
40.0000 mg | DELAYED_RELEASE_TABLET | Freq: Every day | ORAL | Status: DC
  Administered 2016-12-19 – 2016-12-21 (×3): 40 mg via ORAL
  Filled 2016-12-19 (×3): qty 1

## 2016-12-19 MED ORDER — ASPIRIN EC 81 MG PO TBEC
81.0000 mg | DELAYED_RELEASE_TABLET | Freq: Every day | ORAL | Status: DC
  Administered 2016-12-20 – 2016-12-22 (×3): 81 mg via ORAL
  Filled 2016-12-19 (×3): qty 1

## 2016-12-19 MED ORDER — MORPHINE SULFATE (PF) 4 MG/ML IV SOLN: 4.0000 mg | INTRAVENOUS | Status: DC | PRN

## 2016-12-19 MED ORDER — ASPIRIN 81 MG PO TABS: 324.0000 mg | ORAL_TABLET | Freq: Every day | ORAL | Status: DC

## 2016-12-19 MED ORDER — MORPHINE SULFATE (PF) 4 MG/ML IV SOLN: 4.0000 mg | Freq: Once | INTRAVENOUS | Status: DC | PRN

## 2016-12-19 MED ORDER — HEPARIN SODIUM (PORCINE) 5000 UNIT/ML IJ SOLN: 5000.0000 [IU] | Freq: Three times a day (TID) | INTRAMUSCULAR | Status: DC

## 2016-12-19 NOTE — ED Notes (Signed)
Patient transported to X-ray 

## 2016-12-19 NOTE — Progress Notes (Signed)
ANTICOAGULATION CONSULT NOTE - Initial Consult  Pharmacy Consult for heparin Indication: chest pain/ACS  Allergies  Allergen Reactions  . Fish Allergy Diarrhea and Nausea And Vomiting  . Promethazine Hcl Other (See Comments)    Syncope (reaction to phenergan)  . Shellfish Allergy Diarrhea and Nausea And Vomiting  . Yellow Jacket Venom [Bee Venom] Swelling    Localized swelling from yellow jackets and honey bees  . Doxycycline Hyclate Other (See Comments)    esophagitis  . Lipitor [Atorvastatin Calcium] Other (See Comments)    Myalgia/myopathy  . Tetracycline Other (See Comments)    Esophagitis  . Crestor [Rosuvastatin] Other (See Comments)    Myalgia/myopathy  . Tetanus Toxoid Swelling and Rash    Patient Measurements:   Heparin Dosing Weight: 80kg  Vital Signs: Temp: 97.4 F (36.3 C) (08/04 1209) Temp Source: Oral (08/04 1209) BP: 125/77 (08/04 1630) Pulse Rate: 47 (08/04 1630)  Labs:  Recent Labs  12/19/16 1212  HGB 13.7  HCT 41.2  PLT 180  CREATININE 1.77*    Estimated Creatinine Clearance: 33.9 mL/min (A) (by C-G formula based on SCr of 1.77 mg/dL (H)).   Medical History: Past Medical History:  Diagnosis Date  . AAA 09/19/2008  . Allergy   . ANEMIA-IRON DEFICIENCY 09/19/2008  . ANXIETY 06/13/2010  . Arthritis    no meds - knee/back  . Atrial fibrillation ([place]) 04/19/2008   amiodarone rx;  Echocardiogram 05/26/11: No wall motion abnormalities, mild LVH, EF 60%.  . Atrial flutter ([place]) 12/05/2008   s/p RFCA - ablation  . Benign esophageal stricture 09/2075   dilated during EGD  . Blood transfusion without reported diagnosis 2013   [facility] - 2 units transfused  . BRADYCARDIA 12/05/2008  . CAD 12/05/2008   s/p CABG; NSTEMI 05/2011 - LHC 05/25/11: LAD occluded, LIMA-LAD patent, ostial circumflex 50%, AV circumflex 70%, SVG-OM2 with extensive disease with thrombus (culprit vessel), RCA 80% and occluded, SVG-intermediate patent, SVG-PDA/PL A patent.  Given that the  culprit vessel was a subtotally occluded heavily thrombotic graft to a smaller OM2, medical therapy was recommended.;   . Cataract    left - small - no treatment yet  . CKD (chronic kidney disease) 09/19/2008   Qualifier: Diagnosis of  By: Jenny Reichmann MD, Hunt Oris  -- Stage 3  . COLONIC POLYPS, HX OF 09/19/2008   adenomatous polyps 07/2010  . COPD (chronic obstructive pulmonary disease) ([place])   . CORONARY ARTERY BYPASS GRAFT, HX OF 12/05/2008   A. LIMA-LAD, VG-RI, VG-OM2, VG-RPD/RPL;  B. 05/2011 - NSTEMI - CATH WITH 4/5 PATENT GRAFTS AND NEW THROMBUS IN DISTAL VG-OM2 - MED RX  . DIVERTICULOSIS, COLON 09/19/2008  . Erectile dysfunction 09/13/2012  . GASTROINTESTINAL HEMORRHAGE, HX OF 04/19/2008  . GERD 09/19/2008  . GOUT 09/19/2008  . HYPERLIPIDEMIA 09/19/2008   no meds - diet controlled  . HYPERTENSION 09/19/2008   patient denies this dx - no meds for HTN  . Hypothyroidism 12/05/2008  . LOW BACK PAIN 09/19/2008  . Myocardial infarction ([place]) 2013, 2017   both minor - left anterior vessel  . NEPHROLITHIASIS, HX OF 01/11/2009  . PERIPHERAL VASCULAR DISEASE 09/19/2008  . PSA, INCREASED 09/19/2008  . RENAL INSUFFICIENCY 09/19/2008  . Unstable angina pectoris ([place]) 05/2015   Cath 02/02 w/ patent LIMA-LAD, SVG-D1, SVG-RCA, severe dz in SVG-OM, med rx    Medications:  Infusions:  . heparin      Assessment: 33 yom presented to the hospital with CP. To start IV heparin. Baseline CBC is  WNL. He is not on anticoagulation PTA. Planning no boluses due to history of GIB.   Goal of Therapy:  Heparin level 0.3-0.7 units/ml Monitor platelets by anticoagulation protocol: Yes   Plan:  Heparin gtt 1000 units/hr - NO BOLUS Check an 8 hr heparin level Daily heparin level and CBC  Elison Worrel, Rande Lawman 12/19/2016,5:13 PM

## 2016-12-19 NOTE — ED Notes (Signed)
Pt reporting 3/10 CP, does not want morphine at this time. Will continue monitoring for worsening pain. Is a x 4. Awaiting cardiology consult.

## 2016-12-19 NOTE — H&P (Signed)
Patient ID: Omar Benson MRN: [id number], DOB/AGE: 1934/06/04   Admit date: 12/19/2016  Reason for Consult: Chest Pain  Requesting Physician: Dr. Jeanell Sparrow, ED Physician    Primary Physician: Biagio Borg, MD Primary Cardiologist: Dr. Lovena Le    Pt. Profile:  82 y/o male, followed by Dr. Lovena Le, with a h/o ischemic heart disease/CAD, status post bypass surgery, status post stenting, who also has a history of atrial flutter status post catheter ablation and a history of atrial fibrillation, HTN and COPD, presenting to the ED with CC of Chest pain, for which cardiology as been asked to see at the request of Dr. Jeanell Sparrow, ED Physician.   Problem List  Past Medical History:  Diagnosis Date  . AAA 09/19/2008  . Allergy   . ANEMIA-IRON DEFICIENCY 09/19/2008  . ANXIETY 06/13/2010  . Arthritis    no meds - knee/back  . Atrial fibrillation ([place]) 04/19/2008   amiodarone rx;  Echocardiogram 05/26/11: No wall motion abnormalities, mild LVH, EF 60%.  . Atrial flutter ([place]) 12/05/2008   s/p RFCA - ablation  . Benign esophageal stricture 09/2075   dilated during EGD  . Blood transfusion without reported diagnosis 2013   [facility] - 2 units transfused  . BRADYCARDIA 12/05/2008  . CAD 12/05/2008   s/p CABG; NSTEMI 05/2011 - LHC 05/25/11: LAD occluded, LIMA-LAD patent, ostial circumflex 50%, AV circumflex 70%, SVG-OM2 with extensive disease with thrombus (culprit vessel), RCA 80% and occluded, SVG-intermediate patent, SVG-PDA/PL A patent.  Given that the culprit vessel was a subtotally occluded heavily thrombotic graft to a smaller OM2, medical therapy was recommended.;   . Cataract    left - small - no treatment yet  . CKD (chronic kidney disease) 09/19/2008   Qualifier: Diagnosis of  By: Jenny Reichmann MD, Hunt Oris  -- Stage 3  . COLONIC POLYPS, HX OF 09/19/2008   adenomatous polyps 07/2010  . COPD (chronic obstructive pulmonary disease) ([place])   . CORONARY ARTERY BYPASS GRAFT, HX OF 12/05/2008   A. LIMA-LAD, VG-RI, VG-OM2,  VG-RPD/RPL;  B. 05/2011 - NSTEMI - CATH WITH 4/5 PATENT GRAFTS AND NEW THROMBUS IN DISTAL VG-OM2 - MED RX  . DIVERTICULOSIS, COLON 09/19/2008  . Erectile dysfunction 09/13/2012  . GASTROINTESTINAL HEMORRHAGE, HX OF 04/19/2008  . GERD 09/19/2008  . GOUT 09/19/2008  . HYPERLIPIDEMIA 09/19/2008   no meds - diet controlled  . HYPERTENSION 09/19/2008   patient denies this dx - no meds for HTN  . Hypothyroidism 12/05/2008  . LOW BACK PAIN 09/19/2008  . Myocardial infarction ([place]) 2013, 2017   both minor - left anterior vessel  . NEPHROLITHIASIS, HX OF 01/11/2009  . PERIPHERAL VASCULAR DISEASE 09/19/2008  . PSA, INCREASED 09/19/2008  . RENAL INSUFFICIENCY 09/19/2008  . Unstable angina pectoris ([place]) 05/2015   Cath 02/02 w/ patent LIMA-LAD, SVG-D1, SVG-RCA, severe dz in SVG-OM, med rx    Past Surgical History:  Procedure Laterality Date  . CARDIAC CATHETERIZATION  05/25/2011  . CARDIAC CATHETERIZATION N/A 06/20/2015   Procedure: Coronary/Graft Angiography;  Surgeon: Peter M Martinique, MD; LAD, CFX & RCA 100%; LIMA-LAD, SVG-D1 & SVG-RCA OK; SVG-OM severe dz, med rx   . COLONOSCOPY     several - polyps  . CORONARY ARTERY BYPASS GRAFT  2004 X 5  . ENTEROSCOPY N/A 02/13/2014   Procedure: ENTEROSCOPY;  Surgeon: [name], MD;  Location: [facility] ENDOSCOPY;  Service: Gastroenterology;  Laterality: N/A;  super slim  . ESOPHAGOGASTRODUODENOSCOPY  04/28/2012   Procedure: ESOPHAGOGASTRODUODENOSCOPY (EGD);  Surgeon:  [name], MD;  Location: [place];  Service: Endoscopy;  Laterality: N/A;  . KNEE SURGERY Left   . LEFT HEART CATHETERIZATION WITH CORONARY/GRAFT ANGIOGRAM  05/25/2011   Procedure: LEFT HEART CATHETERIZATION WITH [name];  Surgeon: [name], MD;  Location: [facility] CATH LAB;  Service: Cardiovascular;;  . MULTIPLE TOOTH EXTRACTIONS    . s/p afib ablation  2004     Allergies  Allergies  Allergen Reactions  . Bee Venom Anaphylaxis    Only yellow jackets   . Promethazine Hcl Other (See  Comments)    syncope  . Doxycycline Hyclate Other (See Comments)    esophagitis  . Lipitor [Atorvastatin Calcium] Other (See Comments)    Myalgia/myopathy  . Other Diarrhea and Nausea And Vomiting    Seafood  . Tetracycline Other (See Comments)    Esophagitis  . Crestor [Rosuvastatin] Other (See Comments)    Myalgia/myopathy  . Tetanus Toxoid Rash    HPI  82 y/o male, followed by Dr. Lovena Le, with a h/o ischemic heart disease/CAD, status post bypass surgery, status post stenting, who also has a history of atrial flutter status post catheter ablation and a history of atrial fibrillation.  He is not on [facility] given h/o GIB. He also has HTN and COPD. His last LHC was 06/2015. Results are as follows: 1. Severe 3 vessel occlusive CAD 2. Patent LIMA to the LAD 3. Patent SVG to the first diagonal. 4. Patent SVG to the RCA 5. Severely diseased SVG to the OM.   These findings were unchanged compared to previous cath in 2013. The severely disease OM vessel was also present in 2013. This was not suitable for PCI due to extensive and complex disease. Medical therapy was recommended and amlodipine and Imdur were added, however pt notes that he is no longer taking these meds. He reports they where stopped as he was no longer having symptoms. His most recent echo 07/2014 showed normal LVEF at 55-60%, G1DD and mild MR.   He presented to the [facility] ED today with complaint of recurrent left jaw and SSCP CP, that occurred around 11 am today. He felt fine when he woke up. He had a protein shake for breakfast and later developed the discomfort, which occurred at rest and similar to his angina in the past. No associated dyspnea, diaphoresis, dizziness, syncope/ near syncope. He took 1 ASA and 3 SL NTG which eased the pain but did not completely resolve it. Given his lingering symptoms, he came to the ED. EKG shows NSR with bifascicular block. POC troponin is negative. BMP notable for renal insufficiency with SCr at 1.77.  His baseline SCr is ~1.6-1.8. CBC unremarkable. CXR w/o acute findings. He reports that his CP eventually resolved spontaneously. He is currently CP free.   Home Medications  Prior to Admission medications   Medication Sig Start Date End Date Taking? Authorizing Provider  acetaminophen (TYLENOL) 325 MG tablet Take 2 tablets (650 mg total) by mouth every 6 (six) hours as needed for moderate pain. 07/28/16   Thurnell Lose, MD  albuterol (PROVENTIL HFA;VENTOLIN HFA) 108 (90 Base) MCG/ACT inhaler Inhale 2 puffs into the lungs every 6 (six) hours as needed for wheezing or shortness of breath. Reported on 08/09/2015 06/18/16   Biagio Borg, MD  allopurinol (ZYLOPRIM) 300 MG tablet Take 1 tablet (300 mg total) by mouth daily. Patient taking differently: Take 300 mg by mouth at bedtime.  06/18/16   Biagio Borg, MD  amiodarone ([place])  200 MG tablet Take 0.5 tablets (100 mg total) by mouth daily. Patient taking differently: Take 100 mg by mouth at bedtime.  08/12/16   Evans Lance, MD  aspirin 81 MG tablet Take 162 mg by mouth at bedtime.     [provider]  b complex vitamins tablet Take 1 tablet by mouth at bedtime.     [provider]  Cholecalciferol (VITAMIN D) 2000 UNITS CAPS Take 2,000 Units by mouth at bedtime.     [provider]  EPINEPHrine (EPIPEN) 0.3 mg/0.3 mL SOAJ injection Inject 0.3 mLs (0.3 mg total) into the muscle once. 02/01/13   Copland, Gay Filler, MD  levothyroxine (SYNTHROID, LEVOTHROID) 125 MCG tablet Take 1 tablet (125 mcg total) by mouth daily. 06/18/16   Biagio Borg, MD  MAGNESIUM PO Take 1 tablet by mouth at bedtime.     [provider]  Multiple Vitamin (MULTIVITAMIN) tablet Take 1 tablet by mouth at bedtime.     [provider]  nitroGLYCERIN (NITROSTAT) 0.4 MG SL tablet PLACE 1 TABLET UNDER THE TONGUE EVERY 5 MINUTES AS NEEDED FOR CHEST PAIN FOR UP TO 3 DOSES. 09/05/15   Biagio Borg, MD  pantoprazole (PROTONIX) 40 MG tablet  Take 1 tablet (40 mg total) by mouth daily. Yearly physical due in February must see MD for refills Patient taking differently: Take 40 mg by mouth at bedtime. Yearly physical due in February must see MD for refills 06/18/16   Biagio Borg, MD  tiotropium (SPIRIVA HANDIHALER) 18 MCG inhalation capsule PLACE 1 CAPSULE (18 MCG TOTAL) INTO INHALER AND INHALE DAILY. 06/18/16   Biagio Borg, MD  vardenafil (LEVITRA) 20 MG tablet Take 1 tablet (20 mg total) by mouth as needed for erectile dysfunction. 06/18/16 06/18/17  Biagio Borg, MD  vitamin C (ASCORBIC ACID) 500 MG tablet Take 500 mg by mouth at bedtime.     [provider]    Hospital Medications    morphine injection  Family History  Family History  Problem Relation Age of Onset  . Multiple myeloma Mother   . Cancer Mother   . Other Mother        varicose veins  . Diabetes Father   . Diabetes Sister   . Heart disease Paternal Uncle   . Heart disease Maternal Uncle   . Lymphoma Sister        half-sister  . Heart attack Maternal Uncle   . Heart attack Paternal Grandmother   . Heart attack Paternal Grandfather   . Hypertension Neg Hx   . Stroke Neg Hx   . Colon cancer Neg Hx   . Colon polyps Neg Hx   . Rectal cancer Neg Hx   . Stomach cancer Neg Hx     Social History  Social History   Social History  . Marital status: Married    Spouse name: [name]  . Number of children: N/A  . Years of education: N/A   Occupational History  . 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 Smoking status: Former Smoker    Packs/day: 4.00    Years: 31.00    Types: Cigarettes    Quit date: 03/19/1983  . Smokeless tobacco: Never Used  . Alcohol use 12.6 - 16.8 oz/week    21 - 28 Shots of liquor per week  . Drug use: No  . Sexual activity: Yes   Other Topics Concern  . Not on  file   Social History Narrative   Married lives with his wife     Review of Systems General:  No chills, fever,  night sweats or weight changes.  Cardiovascular:  + chest pain, - dyspnea on exertion, edema, orthopnea, palpitations, paroxysmal nocturnal dyspnea. Dermatological: No rash, lesions/masses Respiratory: No cough, dyspnea Urologic: No hematuria, dysuria Abdominal:   No nausea, vomiting, diarrhea, bright red blood per rectum, melena, or hematemesis Neurologic:  No visual changes, wkns, changes in mental status. All other systems reviewed and are otherwise negative except as noted above.  Physical Exam  Blood pressure 134/67, pulse (!) 46, temperature (!) 97.4 F (36.3 C), temperature source Oral, resp. rate 15, SpO2 96 %.  General: Pleasant, NAD Psych: Normal affect. Neuro: Alert and oriented X 3. Moves all extremities spontaneously. HEENT: Normal  Neck: Supple without bruits or JVD. Lungs:  Resp regular and unlabored, CTA. Heart: RRR no s3, s4, or murmurs. Abdomen: Soft, non-tender, non-distended, BS + x 4.  Extremities: No clubbing, cyanosis or edema. DP/PT/Radials 2+ and equal bilaterally.  Labs  Troponin [facility] of Care Test)  Recent Labs  12/19/16 1227  TROPIPOC 0.00   No results for input(s): CKTOTAL, CKMB, TROPONINI in the last 72 hours. Lab Results  Component Value Date   WBC 5.9 12/19/2016   HGB 13.7 12/19/2016   HCT 41.2 12/19/2016   MCV 93.4 12/19/2016   PLT 180 12/19/2016    Recent Labs Lab 12/19/16 1212  NA 138  K 4.8  CL 108  CO2 24  BUN 25*  CREATININE 1.77*  CALCIUM 9.6  GLUCOSE 145*   Lab Results  Component Value Date   CHOL 180 06/18/2016   HDL 37.40 (L) 06/18/2016   LDLCALC 85 06/20/2015   TRIG 263.0 (H) 06/18/2016   No results found for: DDIMER   Radiology/Studies  Dg Chest 2 View  Result Date: 12/19/2016 CLINICAL DATA:  anterior chest pain EXAM: CHEST  2 VIEW COMPARISON:  None. FINDINGS: Sternotomy wires overlie normal cardiac silhouette. No effusion, infiltrate pneumothorax. Lungs are hyperinflated. Degenerative osteophytosis of the  spine. IMPRESSION: Hyperinflated lungs.  No acute findings. Post CABG. Electronically Signed   By: Suzy Bouchard M.D.   On: 12/19/2016 12:59    ECG  NSR with bifascicular block.-- personally reviewed  Telemetry  Sinus brady in the low 50s, pt reports this is his baseline -- personally reviewed    ASSESSMENT AND PLAN  1. Chest Pain with Moderate Risk for Cardiac Etiology: pt with known CAD s/p CABG. His last [facility] 06/2015 showed severe 3 vessel occlusive CAD, Patent LIMA to the LAD, Patent SVG to the first diagonal, Patent SVG to the RCA and Severely diseased SVG to the OM (unchanged from 2013 and not amendable to PCI). Medical therapy elected, however pt is not currently on any antianginals. Not on LA nitrate nor amlodipine. HR limits use of BB. He is currently CP free and POC troponin is negative. Given his cardiac history, recommend admission for r/o. Cycle enzymes x 3. Adjust home regimen. Add Imdur 30 mg. He has room in BP to support this. Pt was asked above Levitra, which is on his med list, however he denies use. He is also not on a statin. We will check FLP and HFTs. Continue ASA.    Signed, Lyda Jester, PA-C, MHS 12/19/2016, 2:39 PM [facility] Pager: [phone]  Cardiology attending  Patient seen and examined. He is well known to me from prior hospitalizations. The patient is an 82 year old man  with multiple medical problems including coronary artery disease status post bypass surgery remotely. He has severe three-vessel disease with patent grafts. He has a history of sinus node dysfunction limiting his medical therapy. He also is a history of gastrointestinal bleeding. The patient has used subungual nitroglycerin with some improvement in his symptoms. He has not had shortness of breath. He is currently pain-free. On my exam his blood pressure is 160/100, pulse was 52, respirations 18, and cardiovascular exam revealed a regular bradycardia with clear lungs and no peripheral  edema. EKG demonstrates sinus bradycardia with right bundle branch block. I recommended the patient be admitted for observation. He'll be treated with intravenous heparin with no bolus due to his history of GI bleeding. If his cardiac markers which were initially negative remain so, early discharge would be appropriate. I would anticipate Imdur and low-dose to be used in conjunction with his other medications.  Cristopher Peru, M.D.

## 2016-12-19 NOTE — Plan of Care (Signed)
I saw patient and put in orders. As I was consulted by the EDP. Spoke to mid level of cardiology who informed me they did not need our assistance. Signing off.  Elwin Mocha MD

## 2016-12-19 NOTE — ED Triage Notes (Signed)
Patient complains of SSCP with radiation to left arm and jaw since 11am. Took 2 sl ntg and aspirin with minimal relief. Appears in no distress on arrival, alert and oriented. Extensive cardiac hx

## 2016-12-19 NOTE — ED Provider Notes (Signed)
[facility] DEPT Provider Note   CSN: [id number] Arrival date & time: 12/19/16  1159     History   Chief Complaint Chief Complaint  Patient presents with  . Chest Pain    HPI Omar Benson is a 82 y.o. male.  The history is provided by the patient and medical records. No language interpreter was used.  Chest Pain   Pertinent negatives include no palpitations.   Omar Benson is a 82 y.o. male  with a PMH of of CAD s/p CABG who presents to the Emergency Department complaining of jaw pain associated with left shoulder pain. Patient states this is similar to his previous pain that he experienced with his prior "heart blockages". He awoke this morning and just "didn't feel good". No particular complaint, just didn't feel himself. He then had chest discomfort, therefore took 324 ASA and nitro with little relief. He then developed jaw / shoulder pain and took another nitro again with no relief. Prior to coming to ER he took a 3rd dose of nitro which again did not improve symptoms. No shortness of breath, diaphoresis, abdominal pain, back pain, fever, chills, cough, congestion or other associated symptoms.   Past Medical History:  Diagnosis Date  . AAA 09/19/2008  . Allergy   . ANEMIA-IRON DEFICIENCY 09/19/2008  . ANXIETY 06/13/2010  . Arthritis    no meds - knee/back  . Atrial fibrillation ([place]) 04/19/2008   amiodarone rx;  Echocardiogram 05/26/11: No wall motion abnormalities, mild LVH, EF 60%.  . Atrial flutter ([place]) 12/05/2008   s/p RFCA - ablation  . Benign esophageal stricture 09/2075   dilated during EGD  . Blood transfusion without reported diagnosis 2013   [facility] - 2 units transfused  . BRADYCARDIA 12/05/2008  . CAD 12/05/2008   s/p CABG; NSTEMI 05/2011 - LHC 05/25/11: LAD occluded, LIMA-LAD patent, ostial circumflex 50%, AV circumflex 70%, SVG-OM2 with extensive disease with thrombus (culprit vessel), RCA 80% and occluded, SVG-intermediate patent, SVG-PDA/PL A patent.  Given that the  culprit vessel was a subtotally occluded heavily thrombotic graft to a smaller OM2, medical therapy was recommended.;   . Cataract    left - small - no treatment yet  . CKD (chronic kidney disease) 09/19/2008   Qualifier: Diagnosis of  By: Jenny Reichmann MD, Hunt Oris  -- Stage 3  . COLONIC POLYPS, HX OF 09/19/2008   adenomatous polyps 07/2010  . COPD (chronic obstructive pulmonary disease) ([place])   . CORONARY ARTERY BYPASS GRAFT, HX OF 12/05/2008   A. LIMA-LAD, VG-RI, VG-OM2, VG-RPD/RPL;  B. 05/2011 - NSTEMI - CATH WITH 4/5 PATENT GRAFTS AND NEW THROMBUS IN DISTAL VG-OM2 - MED RX  . DIVERTICULOSIS, COLON 09/19/2008  . Erectile dysfunction 09/13/2012  . GASTROINTESTINAL HEMORRHAGE, HX OF 04/19/2008  . GERD 09/19/2008  . GOUT 09/19/2008  . HYPERLIPIDEMIA 09/19/2008   no meds - diet controlled  . HYPERTENSION 09/19/2008   patient denies this dx - no meds for HTN  . Hypothyroidism 12/05/2008  . LOW BACK PAIN 09/19/2008  . Myocardial infarction ([place]) 2013, 2017   both minor - left anterior vessel  . NEPHROLITHIASIS, HX OF 01/11/2009  . PERIPHERAL VASCULAR DISEASE 09/19/2008  . PSA, INCREASED 09/19/2008  . RENAL INSUFFICIENCY 09/19/2008  . Unstable angina pectoris ([place]) 05/2015   Cath 02/02 w/ patent LIMA-LAD, SVG-D1, SVG-RCA, severe dz in SVG-OM, med rx    Patient Active Problem List   Diagnosis Date Noted  . Hyperglycemia 12/16/2016  . Chest pain 07/27/2016  . Atypical  chest pain   . Cough 06/29/2015  . Unstable angina ([place]) 06/19/2015  . Abdominal mass, RUQ (right upper quadrant) 04/22/2015  . Benign paroxysmal positional vertigo 02/25/2015  . Former smoker 06/30/2014  . Angiodysplasia of small intestine, except duodenum with bleeding 02/13/2014  . Acute blood loss anemia 02/12/2014  . Skin lesion of face 12/31/2013  . Enlarged salivary gland 12/31/2013  . Right shoulder pain 06/27/2013  . Rash and nonspecific skin eruption 06/27/2013  . Erectile dysfunction 09/13/2012  . GI AVM (gastrointestinal arteriovenous  vascular malformation) 04/28/2012  . Stricture and stenosis of esophagus 04/28/2012  . GI bleed 04/26/2012  . Non-STEMI (non-ST elevated myocardial infarction) ([place]) 05/24/2011  . COPD GOLD 0  09/08/2010  . Hypothyroid 08/21/2010  . Preventative health care 08/21/2010  . GASTRITIS 07/21/2010  . Anxiety state 06/13/2010  . NEPHROLITHIASIS, HX OF 01/11/2009  . Coronary atherosclerosis, CABG 2004 12/05/2008  . ATRIAL FLUTTER, CHRONIC 12/05/2008  . Sinus node dysfunction ([place]) 12/05/2008  . Hyperlipidemia LDL goal <70 09/19/2008  . GOUT 09/19/2008  . Iron deficiency anemia 09/19/2008  . Essential hypertension 09/19/2008  . Aneurysm of abdominal vessel ([place]) 09/19/2008  . PERIPHERAL VASCULAR DISEASE 09/19/2008  . GERD 09/19/2008  . Diverticulitis 09/19/2008  . CKD (chronic kidney disease), stage III 09/19/2008  . LOW BACK PAIN 09/19/2008  . PSA, INCREASED 09/19/2008  . COLONIC POLYPS, HX OF 09/19/2008  . Atrial fibrillation ([place]) 04/19/2008  . GASTROINTESTINAL HEMORRHAGE, HX OF 04/19/2008    Past Surgical History:  Procedure Laterality Date  . CARDIAC CATHETERIZATION  05/25/2011  . CARDIAC CATHETERIZATION N/A 06/20/2015   Procedure: Coronary/Graft Angiography;  Surgeon: Peter M Martinique, MD; LAD, CFX & RCA 100%; LIMA-LAD, SVG-D1 & SVG-RCA OK; SVG-OM severe dz, med rx   . COLONOSCOPY     several - polyps  . CORONARY ARTERY BYPASS GRAFT  2004 X 5  . ENTEROSCOPY N/A 02/13/2014   Procedure: ENTEROSCOPY;  Surgeon: [name], MD;  Location: [facility] ENDOSCOPY;  Service: Gastroenterology;  Laterality: N/A;  super slim  . ESOPHAGOGASTRODUODENOSCOPY  04/28/2012   Procedure: ESOPHAGOGASTRODUODENOSCOPY (EGD);  Surgeon: [name], MD;  Location: [facility];  Service: Endoscopy;  Laterality: N/A;  . KNEE SURGERY Left   . LEFT HEART CATHETERIZATION WITH CORONARY/GRAFT ANGIOGRAM  05/25/2011   Procedure: LEFT HEART CATHETERIZATION WITH [name];  Surgeon: [name], MD;   Location[facility] C[facility] LAB;  Service: Cardiovascular;;  . MULTIPLE TOOTH EXTRACTIONS    . s/p afib ablation  2004       Home Medications    Prior to Admission medications   Medication Sig Start Date End Date Taking? Authorizing Provider  acetaminophen (TYLENOL) 325 MG tablet Take 2 tablets (650 mg total) by mouth every 6 (six) hours as needed for moderate pain. 07/28/16   Thurnell Lose, MD  albuterol (PROVENTIL HFA;VENTOLIN HFA) 108 (90 Base) MCG/ACT inhaler Inhale 2 puffs into the lungs every 6 (six) hours as needed for wheezing or shortness of breath. Reported on 08/09/2015 06/18/16   Biagio Borg, MD  allopurinol (ZYLOPRIM) 300 MG tablet Take 1 tablet (300 mg total) by mouth daily. Patient taking differently: Take 300 mg by mouth at bedtime.  06/18/16   Biagio Borg, MD  amiodarone (PACERONE) 200 MG tablet Take 0.5 tablets (100 mg total) by mouth daily. Patient taking differently: Take 100 mg by mouth at bedtime.  08/12/16   Evans Lance, MD  aspirin 81 MG tablet Take 162 mg by mouth at  bedtime.     [provider]  b complex vitamins tablet Take 1 tablet by mouth at bedtime.     [provider]  Cholecalciferol (VITAMIN D) 2000 UNITS CAPS Take 2,000 Units by mouth at bedtime.     [provider]  EPINEPHrine (EPIPEN) 0.3 mg/0.3 mL SOAJ injection Inject 0.3 mLs (0.3 mg total) into the muscle once. 02/01/13   Copland, Gay Filler, MD  levothyroxine (SYNTHROID, LEVOTHROID) 125 MCG tablet Take 1 tablet (125 mcg total) by mouth daily. 06/18/16   Biagio Borg, MD  MAGNESIUM PO Take 1 tablet by mouth at bedtime.     [provider]  Multiple Vitamin (MULTIVITAMIN) tablet Take 1 tablet by mouth at bedtime.     [provider]  nitroGLYCERIN (NITROSTAT) 0.4 MG SL tablet PLACE 1 TABLET UNDER THE TONGUE EVERY 5 MINUTES AS NEEDED FOR CHEST PAIN FOR UP TO 3 DOSES. 09/05/15   Biagio Borg, MD  pantoprazole (PROTONIX) 40 MG tablet Take 1 tablet (40 mg total) by  mouth daily. Yearly physical due in February must see MD for refills Patient taking differently: Take 40 mg by mouth at bedtime. Yearly physical due in February must see MD for refills 06/18/16   Biagio Borg, MD  tiotropium (SPIRIVA HANDIHALER) 18 MCG inhalation capsule PLACE 1 CAPSULE (18 MCG TOTAL) INTO INHALER AND INHALE DAILY. 06/18/16   Biagio Borg, MD  vardenafil (LEVITRA) 20 MG tablet Take 1 tablet (20 mg total) by mouth as needed for erectile dysfunction. 06/18/16 06/18/17  Biagio Borg, MD  vitamin C (ASCORBIC ACID) 500 MG tablet Take 500 mg by mouth at bedtime.     [provider]    Family History Family History  Problem Relation Age of Onset  . Multiple myeloma Mother   . Cancer Mother   . Other Mother        varicose veins  . Diabetes Father   . Diabetes Sister   . Heart disease Paternal Uncle   . Heart disease Maternal Uncle   . Lymphoma Sister        half-sister  . Heart attack Maternal Uncle   . Heart attack Paternal Grandmother   . Heart attack Paternal Grandfather   . Hypertension Neg Hx   . Stroke Neg Hx   . Colon cancer Neg Hx   . Colon polyps Neg Hx   . Rectal cancer Neg Hx   . Stomach cancer Neg Hx     Social History Social History  Substance Use Topics  . Smoking status: Former Smoker    Packs/day: 4.00    Years: 31.00    Types: Cigarettes    Quit date: 03/19/1983  . Smokeless tobacco: Never Used  . Alcohol use 12.6 - 16.8 oz/week    21 - 28 Shots of liquor per week     Allergies   Fish allergy; Promethazine hcl; Shellfish allergy; Yellow jacket venom [bee venom]; Doxycycline hyclate; Lipitor [atorvastatin calcium]; Tetracycline; Crestor [rosuvastatin]; and Tetanus toxoid   Review of Systems Review of Systems  HENT:       + jaw pain  Cardiovascular: Positive for chest pain. Negative for palpitations and leg swelling.  Musculoskeletal: Positive for arthralgias (Shoulder pain).  All other systems reviewed and are negative.   Physical  Exam Updated Vital Signs BP 134/67   Pulse (!) 46   Temp (!) 97.4 F (36.3 C) (Oral)   Resp 15   SpO2 96%   Physical Exam  Constitutional: He is oriented to person, place, and time. He appears well-developed and well-nourished. No distress.  HENT:  Head: Normocephalic and atraumatic.  Cardiovascular: Normal rate and regular rhythm.   No murmur heard. Pulmonary/Chest: Effort normal and breath sounds normal. No respiratory distress.  Abdominal: Soft. He exhibits no distension. There is no tenderness.  Musculoskeletal: He exhibits no edema.  Neurological: He is alert and oriented to person, place, and time.  Skin: Skin is warm and dry.  Nursing note and vitals reviewed.    ED Treatments / Results  Labs (all labs ordered are listed, but only abnormal results are displayed) Labs Reviewed  BASIC METABOLIC PANEL - Abnormal; Notable for the following:       Result Value   Glucose, Bld 145 (*)    BUN 25 (*)    Creatinine, Ser 1.77 (*)    GFR calc non Af Amer 35 (*)    GFR calc Af Amer 40 (*)    All other components within normal limits  CBC  I-STAT TROPONIN, ED  I-STAT TROPONIN, ED    EKG  EKG Interpretation  Date/Time:  Saturday December 19 2016 12:05:02 EDT Ventricular Rate:  62 PR Interval:  182 QRS Duration: 146 QT Interval:  458 QTC Calculation: 464 R Axis:   -88 Text Interpretation:  Normal sinus rhythm Right bundle branch block Left anterior fascicular block  Bifascicular block  Abnormal ECG Confirmed by Pattricia Boss [phone]) on 12/19/2016 3:04:30 PM       Radiology Dg Chest 2 View  Result Date: 12/19/2016 CLINICAL DATA:  anterior chest pain EXAM: CHEST  2 VIEW COMPARISON:  None. FINDINGS: Sternotomy wires overlie normal cardiac silhouette. No effusion, infiltrate pneumothorax. Lungs are hyperinflated. Degenerative osteophytosis of the spine. IMPRESSION: Hyperinflated lungs.  No acute findings. Post CABG. Electronically Signed   By: Suzy Bouchard M.D.   On:  12/19/2016 12:59    Procedures Procedures (including critical care time)  Medications Ordered in ED Medications  morphine 4 MG/ML injection 4 mg (not administered)     Initial Impression / Assessment and Plan / ED Course  I have reviewed the triage vital signs and the nursing notes.  Pertinent labs & imaging results that were available during my care of the patient were reviewed by me and considered in my medical decision making (see chart for details).    Omar Benson is a 82 y.o. male who presents to ED for chest discomfort, jaw pain and left shoulder pain c/w previous anginal event. Troponin negative. EKG with non-specific changes. Consulted cardiology. Hospitalist to admit.   Patient discussed with Dr. Jeanell Sparrow who agrees with treatment plan.   Final Clinical Impressions(s) / ED Diagnoses   Final diagnoses:  Chest pain with high risk for cardiac etiology    New Prescriptions New Prescriptions   No medications on file     Paisli Silfies, Ozella Almond, PA-C 12/19/16 1531    Pattricia Boss, MD 12/19/16 [phone]

## 2016-12-20 ENCOUNTER — Inpatient Hospital Stay (HOSPITAL_COMMUNITY): Payer: Medicare Other

## 2016-12-20 DIAGNOSIS — I200 Unstable angina: Secondary | ICD-10-CM

## 2016-12-20 DIAGNOSIS — I25110 Atherosclerotic heart disease of native coronary artery with unstable angina pectoris: Secondary | ICD-10-CM | POA: Diagnosis present

## 2016-12-20 DIAGNOSIS — Z91030 Bee allergy status: Secondary | ICD-10-CM | POA: Diagnosis not present

## 2016-12-20 DIAGNOSIS — I214 Non-ST elevation (NSTEMI) myocardial infarction: Secondary | ICD-10-CM | POA: Diagnosis not present

## 2016-12-20 DIAGNOSIS — Z807 Family history of other malignant neoplasms of lymphoid, hematopoietic and related tissues: Secondary | ICD-10-CM | POA: Diagnosis not present

## 2016-12-20 DIAGNOSIS — Z8249 Family history of ischemic heart disease and other diseases of the circulatory system: Secondary | ICD-10-CM | POA: Diagnosis not present

## 2016-12-20 DIAGNOSIS — R079 Chest pain, unspecified: Secondary | ICD-10-CM | POA: Diagnosis not present

## 2016-12-20 DIAGNOSIS — K219 Gastro-esophageal reflux disease without esophagitis: Secondary | ICD-10-CM | POA: Diagnosis present

## 2016-12-20 DIAGNOSIS — R072 Precordial pain: Secondary | ICD-10-CM | POA: Diagnosis not present

## 2016-12-20 DIAGNOSIS — E785 Hyperlipidemia, unspecified: Secondary | ICD-10-CM | POA: Diagnosis present

## 2016-12-20 DIAGNOSIS — Z881 Allergy status to other antibiotic agents status: Secondary | ICD-10-CM | POA: Diagnosis not present

## 2016-12-20 DIAGNOSIS — I252 Old myocardial infarction: Secondary | ICD-10-CM | POA: Diagnosis not present

## 2016-12-20 DIAGNOSIS — I129 Hypertensive chronic kidney disease with stage 1 through stage 4 chronic kidney disease, or unspecified chronic kidney disease: Secondary | ICD-10-CM | POA: Diagnosis present

## 2016-12-20 DIAGNOSIS — J449 Chronic obstructive pulmonary disease, unspecified: Secondary | ICD-10-CM | POA: Diagnosis present

## 2016-12-20 DIAGNOSIS — N183 Chronic kidney disease, stage 3 (moderate): Secondary | ICD-10-CM | POA: Diagnosis present

## 2016-12-20 DIAGNOSIS — Z91038 Other insect allergy status: Secondary | ICD-10-CM | POA: Diagnosis not present

## 2016-12-20 DIAGNOSIS — Z7982 Long term (current) use of aspirin: Secondary | ICD-10-CM | POA: Diagnosis not present

## 2016-12-20 DIAGNOSIS — Z887 Allergy status to serum and vaccine status: Secondary | ICD-10-CM | POA: Diagnosis not present

## 2016-12-20 DIAGNOSIS — Z91013 Allergy to seafood: Secondary | ICD-10-CM | POA: Diagnosis not present

## 2016-12-20 DIAGNOSIS — Z87891 Personal history of nicotine dependence: Secondary | ICD-10-CM | POA: Diagnosis not present

## 2016-12-20 DIAGNOSIS — I4892 Unspecified atrial flutter: Secondary | ICD-10-CM | POA: Diagnosis present

## 2016-12-20 DIAGNOSIS — Z87442 Personal history of urinary calculi: Secondary | ICD-10-CM | POA: Diagnosis not present

## 2016-12-20 DIAGNOSIS — I10 Essential (primary) hypertension: Secondary | ICD-10-CM | POA: Diagnosis not present

## 2016-12-20 DIAGNOSIS — I4510 Unspecified right bundle-branch block: Secondary | ICD-10-CM | POA: Diagnosis present

## 2016-12-20 DIAGNOSIS — Z888 Allergy status to other drugs, medicaments and biological substances status: Secondary | ICD-10-CM | POA: Diagnosis not present

## 2016-12-20 DIAGNOSIS — Z951 Presence of aortocoronary bypass graft: Secondary | ICD-10-CM | POA: Diagnosis not present

## 2016-12-20 DIAGNOSIS — M109 Gout, unspecified: Secondary | ICD-10-CM | POA: Diagnosis present

## 2016-12-20 DIAGNOSIS — Z833 Family history of diabetes mellitus: Secondary | ICD-10-CM | POA: Diagnosis not present

## 2016-12-20 LAB — ECHOCARDIOGRAM COMPLETE
Ao-asc: 32 cm
E decel time: 398 msec
EERAT: 5.48
FS: 29 % (ref 28–44)
Height: 67.008 in
IVS/LV PW RATIO, ED: 1.22
LA ID, A-P, ES: 39 mm
LA diam end sys: 39 mm
LA diam index: 1.97 cm/m2
LA vol index: 23.2 mL/m2
LAVOL: 45.8 mL
LAVOLA4C: 34.8 mL
LV E/e' medial: 5.48
LV E/e'average: 5.48
LV PW d: 10.4 mm — AB (ref 0.6–1.1)
LV TDI E'MEDIAL: 4.03
LVELAT: 9.57 cm/s
LVOT area: 2.54 cm2
LVOT diameter: 18 mm
Lateral S' vel: 9.79 cm/s
MV Dec: 398
MVPKAVEL: 85.4 m/s
MVPKEVEL: 52.4 m/s
RV TAPSE: 16.5 mm
TDI e' lateral: 9.57
Weight: 2857.16 oz

## 2016-12-20 LAB — CBC
HEMATOCRIT: 38.3 % — AB (ref 39.0–52.0)
HEMOGLOBIN: 12.9 g/dL — AB (ref 13.0–17.0)
MCH: 31.3 pg (ref 26.0–34.0)
MCHC: 33.7 g/dL (ref 30.0–36.0)
MCV: 93 fL (ref 78.0–100.0)
Platelets: 181 10*3/uL (ref 150–400)
RBC: 4.12 MIL/uL — AB (ref 4.22–5.81)
RDW: 13.9 % (ref 11.5–15.5)
WBC: 5 10*3/uL (ref 4.0–10.5)

## 2016-12-20 LAB — BASIC METABOLIC PANEL
ANION GAP: 8 (ref 5–15)
BUN: 20 mg/dL (ref 6–20)
CALCIUM: 9 mg/dL (ref 8.9–10.3)
CHLORIDE: 107 mmol/L (ref 101–111)
CO2: 25 mmol/L (ref 22–32)
Creatinine, Ser: 1.68 mg/dL — ABNORMAL HIGH (ref 0.61–1.24)
GFR calc Af Amer: 43 mL/min — ABNORMAL LOW (ref >60)
GFR, EST NON AFRICAN AMERICAN: 37 mL/min — AB (ref >60)
GLUCOSE: 94 mg/dL (ref 65–99)
Potassium: 4.2 mmol/L (ref 3.5–5.1)
Sodium: 140 mmol/L (ref 135–145)

## 2016-12-20 LAB — HEPARIN LEVEL (UNFRACTIONATED)
HEPARIN UNFRACTIONATED: 0.27 [IU]/mL — AB (ref 0.30–0.70)
HEPARIN UNFRACTIONATED: 0.35 [IU]/mL (ref 0.30–0.70)

## 2016-12-20 LAB — LIPID PANEL
CHOL/HDL RATIO: 4.9 ratio
CHOLESTEROL: 177 mg/dL (ref 0–200)
HDL: 36 mg/dL — ABNORMAL LOW (ref >40)
LDL Cholesterol: 99 mg/dL (ref 0–99)
TRIGLYCERIDES: 208 mg/dL — AB (ref <150)
VLDL: 42 mg/dL — AB (ref 0–40)

## 2016-12-20 LAB — TROPONIN I
TROPONIN I: 0.98 ng/mL — AB (ref <0.03)
Troponin I: 0.52 ng/mL (ref <0.03)

## 2016-12-20 MED ORDER — ISOSORBIDE MONONITRATE ER 30 MG PO TB24
15.0000 mg | ORAL_TABLET | Freq: Every day | ORAL | Status: DC
  Administered 2016-12-20 – 2016-12-22 (×3): 15 mg via ORAL
  Filled 2016-12-20 (×3): qty 1

## 2016-12-20 MED ORDER — HYDRALAZINE HCL 25 MG PO TABS
25.0000 mg | ORAL_TABLET | Freq: Three times a day (TID) | ORAL | Status: DC
  Administered 2016-12-20 – 2016-12-21 (×2): 25 mg via ORAL
  Filled 2016-12-20 (×3): qty 1

## 2016-12-20 NOTE — Progress Notes (Signed)
Progress Note  Patient Name: Omar Benson Date of Encounter: 12/20/2016  Primary Cardiologist: Lovena Le  Subjective   No chest pain. Still some jaw heaviness  Inpatient Medications    Scheduled Meds: . allopurinol  300 mg Oral QHS  . amiodarone  100 mg Oral QHS  . aspirin EC  81 mg Oral Daily  . hydrALAZINE  25 mg Oral Q8H  . isosorbide mononitrate  15 mg Oral Daily  . levothyroxine  125 mcg Oral QAC breakfast  . magnesium oxide  200 mg Oral QHS  . pantoprazole  40 mg Oral QHS   Continuous Infusions: . heparin 1,100 Units/hr (12/20/16 1000)   PRN Meds: acetaminophen, hydrALAZINE, morphine injection, nitroGLYCERIN, ondansetron (ZOFRAN) IV, zolpidem   Vital Signs    Vitals:   12/20/16 0100 12/20/16 0442 12/20/16 0700 12/20/16 0800  BP:  138/72    Pulse:   (!) 47 (!) 51  Resp:  19    Temp:  97.7 F (36.5 C)    TempSrc:      SpO2:  95% 95% 97%  Weight: 178 lb 9.2 oz (81 kg)     Height: 5' 7.01" (1.702 m)       Intake/Output Summary (Last 24 hours) at 12/20/16 1035 Last data filed at 12/20/16 0444  Gross per 24 hour  Intake            87.33 ml  Output              250 ml  Net          -162.67 ml   Filed Weights   12/20/16 0100  Weight: 178 lb 9.2 oz (81 kg)    Telemetry    Sinus bradycardia - Personally Reviewed  ECG    Sinus brady with RBBB, and NSTTT changes - Personally Reviewed  Physical Exam   GEN: No acute distress.   Neck: 6 cm JVD Cardiac: Reg brady, no murmurs, rubs, or gallops.  Respiratory: Clear to auscultation bilaterally. GI: Soft, nontender, non-distended  MS: No edema; No deformity. Neuro:  Nonfocal  Psych: Normal affect   Labs    Chemistry Recent Labs Lab 12/19/16 1212 12/19/16 1600 12/20/16 0202  NA 138  --  140  K 4.8  --  4.2  CL 108  --  107  CO2 24  --  25  GLUCOSE 145*  --  94  BUN 25*  --  20  CREATININE 1.77* 1.63* 1.68*  CALCIUM 9.6  --  9.0  GFRNONAA 35* 38* 37*  GFRAA 40* 44* 43*  ANIONGAP 6  --  8       Hematology Recent Labs Lab 12/19/16 1212 12/19/16 2246 12/20/16 0202  WBC 5.9 5.2 5.0  RBC 4.41 4.10* 4.12*  HGB 13.7 13.0 12.9*  HCT 41.2 37.9* 38.3*  MCV 93.4 92.4 93.0  MCH 31.1 31.7 31.3  MCHC 33.3 34.3 33.7  RDW 13.8 13.8 13.9  PLT 180 177 181    Cardiac Enzymes Recent Labs Lab 12/19/16 1600 12/19/16 2246 12/20/16 0202  TROPONINI 0.08* 0.58* 0.98*    Recent Labs Lab 12/19/16 1227 12/19/16 1605  TROPIPOC 0.00 0.04     BNPNo results for input(s): BNP, PROBNP in the last 168 hours.   DDimer No results for input(s): DDIMER in the last 168 hours.   Radiology    Dg Chest 2 View  Result Date: 12/19/2016 CLINICAL DATA:  anterior chest pain EXAM: CHEST  2 VIEW COMPARISON:  None. FINDINGS: Sternotomy  wires overlie normal cardiac silhouette. No effusion, infiltrate pneumothorax. Lungs are hyperinflated. Degenerative osteophytosis of the spine. IMPRESSION: Hyperinflated lungs.  No acute findings. Post CABG. Electronically Signed   By: Suzy Bouchard M.D.   On: 12/19/2016 12:59    Cardiac Studies   Cardiac markers demonstrate an elevated troponin  Patient Profile     82 y.o. male admitted with jaw pain and non-diagnostic ECG and now with gradual troponin increase.  Assessment & Plan    1. [place] - He will continue IV heparin. He is not a good candidate for invasive treatment due to graft disease and elevated creatinine. We will add imdur and treat blood pressure. Would try not to do a heart cath unless the troponin continues higher. 2. HTN -his blood pressure is not well controlled. He will start hydralazine 3. Sinus node dysfunction - he is bradycardic and cannot take beta blockers. He may some day require PPM.  4. Dyslipidemia - he has had problems with statin drugs in the past with muscle aches. He might be a candidate for PCSK9 inhibition.  Signed, Cristopher Peru, MD  12/20/2016, 10:35 AM  Patient ID: Omar Benson, male   DOB: [birth year], 82 y.o.   MRN:  [id number]

## 2016-12-20 NOTE — Progress Notes (Signed)
MD made aware of trending low BP. 98/66.

## 2016-12-20 NOTE — Progress Notes (Signed)
  Echocardiogram 2D Echocardiogram has been performed.  Omar Benson 12/20/2016, 3:41 PM

## 2016-12-20 NOTE — Progress Notes (Signed)
ANTICOAGULATION CONSULT NOTE - Follow Up Consult  Pharmacy Consult for Heparin  Indication: chest pain/ACS  Allergies  Allergen Reactions  . Fish Allergy Diarrhea and Nausea And Vomiting  . Promethazine Hcl Other (See Comments)    Syncope (reaction to phenergan)  . Shellfish Allergy Diarrhea and Nausea And Vomiting  . Yellow Jacket Venom [Bee Venom] Swelling    Localized swelling from yellow jackets and honey bees  . Doxycycline Hyclate Other (See Comments)    esophagitis  . Lipitor [Atorvastatin Calcium] Other (See Comments)    Myalgia/myopathy  . Tetracycline Other (See Comments)    Esophagitis  . Crestor [Rosuvastatin] Other (See Comments)    Myalgia/myopathy  . Tetanus Toxoid Swelling and Rash   Patient Measurements: Height: 5' 7.01" (170.2 cm) Weight: 178 lb 9.2 oz (81 kg) IBW/kg (Calculated) : 66.12  Vital Signs: Temp: 98.4 F (36.9 C) (08/04 1941) Temp Source: Oral (08/04 1729) BP: 147/68 (08/04 1941) Pulse Rate: 54 (08/04 1941)  Labs:  Recent Labs  12/19/16 1212 12/19/16 1600 12/19/16 2246 12/20/16 0202  HGB 13.7  --  13.0 12.9*  HCT 41.2  --  37.9* 38.3*  PLT 180  --  177 181  HEPARINUNFRC  --   --   --  0.27*  CREATININE 1.77* 1.63*  --   --   TROPONINI  --  0.08* 0.58*  --     Estimated Creatinine Clearance: 36.9 mL/min (A) (by C-G formula based on SCr of 1.63 mg/dL (H)).  Assessment: Heparin for CP, mild inc in trop last night, heparin level low this AM, no issues per RN.   Goal of Therapy:  Heparin level 0.3-0.7 units/ml Monitor platelets by anticoagulation protocol: Yes   Plan:  -No bolus with hx GIB -Inc heparin to 1100 units/hr -1100 HL  Ellington Cornia 12/20/2016,3:16 AM

## 2016-12-20 NOTE — Progress Notes (Signed)
CRITICAL VALUE ALERT  Critical Value: Troponin 0.58  Date & Time Notied:  12/19/16 23:30  Provider Notified: Card-fellow   Orders Received/Actions taken: Hep gtt running, pt denies any pain. No new orders at this time

## 2016-12-20 NOTE — Progress Notes (Signed)
ANTICOAGULATION CONSULT NOTE - Follow Up Consult  Pharmacy Consult for Heparin  Indication: chest pain/ACS  Allergies  Allergen Reactions  . Fish Allergy Diarrhea and Nausea And Vomiting  . Promethazine Hcl Other (See Comments)    Syncope (reaction to phenergan)  . Shellfish Allergy Diarrhea and Nausea And Vomiting  . Yellow Jacket Venom [Bee Venom] Swelling    Localized swelling from yellow jackets and honey bees  . Doxycycline Hyclate Other (See Comments)    esophagitis  . Lipitor [Atorvastatin Calcium] Other (See Comments)    Myalgia/myopathy  . Tetracycline Other (See Comments)    Esophagitis  . Crestor [Rosuvastatin] Other (See Comments)    Myalgia/myopathy  . Tetanus Toxoid Swelling and Rash   Patient Measurements: Height: 5' 7.01" (170.2 cm) Weight: 178 lb 9.2 oz (81 kg) IBW/kg (Calculated) : 66.12  Vital Signs: Temp: 97.7 F (36.5 C) (08/05 0442) BP: 163/74 (08/05 1110) Pulse Rate: 51 (08/05 0800)  Labs:  Recent Labs  12/19/16 1212 12/19/16 1600 12/19/16 2246 12/20/16 0202 12/20/16 1120  HGB 13.7  --  13.0 12.9*  --   HCT 41.2  --  37.9* 38.3*  --   PLT 180  --  177 181  --   HEPARINUNFRC  --   --   --  0.27* 0.35  CREATININE 1.77* 1.63*  --  1.68*  --   TROPONINI  --  0.08* 0.58* 0.98*  --     Estimated Creatinine Clearance: 35.8 mL/min (A) (by C-G formula based on SCr of 1.68 mg/dL (H)).  Assessment: 23 yoM presenting with CP, stared on heparin for r/o ACS. Heparin level therapeutic at 0.35 after rate increase. H&H, pltc stable. No bleeding noted.  Goal of Therapy:  Heparin level 0.3-0.7 units/ml Monitor platelets by anticoagulation protocol: Yes   Plan:  Continue heparin gtt at 1100 units/hr Daily heparin level and CBC Monitor H&H and platelets   Khalis Hittle N. Gerarda Fraction, PharmD PGY1 Pharmacy Resident Pager: [phone]  12/20/2016,12:42 PM

## 2016-12-21 LAB — CBC
HCT: 38.3 % — ABNORMAL LOW (ref 39.0–52.0)
Hemoglobin: 13.1 g/dL (ref 13.0–17.0)
MCH: 31.7 pg (ref 26.0–34.0)
MCHC: 34.2 g/dL (ref 30.0–36.0)
MCV: 92.7 fL (ref 78.0–100.0)
PLATELETS: 184 10*3/uL (ref 150–400)
RBC: 4.13 MIL/uL — ABNORMAL LOW (ref 4.22–5.81)
RDW: 14.1 % (ref 11.5–15.5)
WBC: 5.6 10*3/uL (ref 4.0–10.5)

## 2016-12-21 LAB — BASIC METABOLIC PANEL
Anion gap: 6 (ref 5–15)
BUN: 22 mg/dL — ABNORMAL HIGH (ref 6–20)
CALCIUM: 9.3 mg/dL (ref 8.9–10.3)
CO2: 27 mmol/L (ref 22–32)
CREATININE: 1.88 mg/dL — AB (ref 0.61–1.24)
Chloride: 107 mmol/L (ref 101–111)
GFR calc non Af Amer: 32 mL/min — ABNORMAL LOW (ref >60)
GFR, EST AFRICAN AMERICAN: 37 mL/min — AB (ref >60)
Glucose, Bld: 100 mg/dL — ABNORMAL HIGH (ref 65–99)
Potassium: 4.5 mmol/L (ref 3.5–5.1)
SODIUM: 140 mmol/L (ref 135–145)

## 2016-12-21 LAB — HEPARIN LEVEL (UNFRACTIONATED): HEPARIN UNFRACTIONATED: 0.48 [IU]/mL (ref 0.30–0.70)

## 2016-12-21 LAB — TROPONIN I
TROPONIN I: 0.33 ng/mL — AB (ref <0.03)
Troponin I: 0.55 ng/mL (ref <0.03)
Troponin I: 0.42 ng/mL (ref <0.03)

## 2016-12-21 LAB — GLUCOSE, CAPILLARY: Glucose-Capillary: 126 mg/dL — ABNORMAL HIGH (ref 65–99)

## 2016-12-21 NOTE — Progress Notes (Signed)
ANTICOAGULATION CONSULT NOTE - Follow Up Consult  Pharmacy Consult for Heparin  Indication: chest pain/ACS  Allergies  Allergen Reactions  . Fish Allergy Diarrhea and Nausea And Vomiting  . Promethazine Hcl Other (See Comments)    Syncope (reaction to phenergan)  . Shellfish Allergy Diarrhea and Nausea And Vomiting  . Yellow Jacket Venom [Bee Venom] Swelling    Localized swelling from yellow jackets and honey bees  . Doxycycline Hyclate Other (See Comments)    esophagitis  . Lipitor [Atorvastatin Calcium] Other (See Comments)    Myalgia/myopathy  . Tetracycline Other (See Comments)    Esophagitis  . Crestor [Rosuvastatin] Other (See Comments)    Myalgia/myopathy  . Tetanus Toxoid Swelling and Rash   Patient Measurements: Height: 5' 7.01" (170.2 cm) Weight: 170 lb (77.1 kg) IBW/kg (Calculated) : 66.12  Vital Signs: Temp: 97.9 F (36.6 C) (08/06 0448) BP: 124/75 (08/06 0448) Pulse Rate: 52 (08/06 0448)  Labs:  Recent Labs  12/19/16 1600 12/19/16 2246 12/20/16 0202 12/20/16 1120 12/20/16 1929 12/21/16 0213  HGB  --  13.0 12.9*  --   --  13.1  HCT  --  37.9* 38.3*  --   --  38.3*  PLT  --  177 181  --   --  184  HEPARINUNFRC  --   --  0.27* 0.35  --  0.48  CREATININE 1.63*  --  1.68*  --   --  1.88*  TROPONINI 0.08* 0.58* 0.98*  --  0.52* 0.55*    Estimated Creatinine Clearance: 29.3 mL/min (A) (by C-G formula based on SCr of 1.88 mg/dL (H)).  Assessment: 42 yoM presenting with CP, stared on heparin for r/o ACS. Heparin level is at goal. Per MD notes; No plans for cath at this time.  Goal of Therapy:  Heparin level 0.3-0.7 units/ml Monitor platelets by anticoagulation protocol: Yes   Plan:  -No heparin changes needed -Consider heparin for a total of 48 hours? -Daily heparin level and CBC  Hildred Laser, Pharm D 12/21/2016 8:28 AM

## 2016-12-21 NOTE — Progress Notes (Signed)
CARDIAC REHAB PHASE I   Pt just returned from ambulating with cardiac mobility specialist, no complaints. Reviewed education including ntg, exercise guidelines, heart healthy diet and phase 2 cardiac rehab. Pt will need qualifying diagnosis (stable angina, NSTEMI) for phase 2 referral. Pt in bed, call bell within reach. Will follow up tomorrow.  [place], RN, BSN 12/21/2016 2:25 PM

## 2016-12-21 NOTE — Progress Notes (Signed)
Progress Note  Patient Name: Omar Benson Date of Encounter: 12/21/2016  Primary Cardiologist: Dr. Crissie Sickles  Subjective   Jaw pain resolved. No chest pain or dyspnea.   Inpatient Medications    Scheduled Meds: . allopurinol  300 mg Oral QHS  . amiodarone  100 mg Oral QHS  . aspirin EC  81 mg Oral Daily  . hydrALAZINE  25 mg Oral Q8H  . isosorbide mononitrate  15 mg Oral Daily  . levothyroxine  125 mcg Oral QAC breakfast  . magnesium oxide  200 mg Oral QHS  . pantoprazole  40 mg Oral QHS   Continuous Infusions: . heparin 1,100 Units/hr (12/20/16 1800)   PRN Meds: acetaminophen, hydrALAZINE, morphine injection, nitroGLYCERIN, ondansetron (ZOFRAN) IV, zolpidem   Vital Signs    Vitals:   12/20/16 1726 12/20/16 2037 12/20/16 2052 12/21/16 0448  BP: 109/72 115/69 121/72 124/75  Pulse: (!) 54  92 (!) 52  Resp: 18  19 18   Temp: 97.8 F (36.6 C)  98.2 F (36.8 C) 97.9 F (36.6 C)  TempSrc: Oral     SpO2: 95%  97% 94%  Weight:    170 lb (77.1 kg)  Height:        Intake/Output Summary (Last 24 hours) at 12/21/16 0931 Last data filed at 12/21/16 0600  Gross per 24 hour  Intake          1034.02 ml  Output             1675 ml  Net          -640.98 ml   Filed Weights   12/20/16 0100 12/21/16 0448  Weight: 178 lb 9.2 oz (81 kg) 170 lb (77.1 kg)    Telemetry    Sinus bradycardia with PACs - Personally Reviewed  ECG    None today   Physical Exam   GEN: No acute distress.   Neck: No JVD Cardiac: RRR, no murmurs, rubs, or gallops.  Respiratory: Clear to auscultation bilaterally. GI: Soft, nontender, non-distended  MS: No edema; No deformity. Neuro:  Nonfocal  Psych: Normal affect   Labs    Chemistry Recent Labs Lab 12/19/16 1212 12/19/16 1600 12/20/16 0202 12/21/16 0213  NA 138  --  140 140  K 4.8  --  4.2 4.5  CL 108  --  107 107  CO2 24  --  25 27  GLUCOSE 145*  --  94 100*  BUN 25*  --  20 22*  CREATININE 1.77* 1.63* 1.68* 1.88*  CALCIUM  9.6  --  9.0 9.3  GFRNONAA 35* 38* 37* 32*  GFRAA 40* 44* 43* 37*  ANIONGAP 6  --  8 6     Hematology Recent Labs Lab 12/19/16 2246 12/20/16 0202 12/21/16 0213  WBC 5.2 5.0 5.6  RBC 4.10* 4.12* 4.13*  HGB 13.0 12.9* 13.1  HCT 37.9* 38.3* 38.3*  MCV 92.4 93.0 92.7  MCH 31.7 31.3 31.7  MCHC 34.3 33.7 34.2  RDW 13.8 13.9 14.1  PLT 177 181 184    Cardiac Enzymes Recent Labs Lab 12/19/16 2246 12/20/16 0202 12/20/16 1929 12/21/16 0213  TROPONINI 0.58* 0.98* 0.52* 0.55*    Recent Labs Lab 12/19/16 1227 12/19/16 1605  TROPIPOC 0.00 0.04     BNPNo results for input(s): BNP, PROBNP in the last 168 hours.   DDimer No results for input(s): DDIMER in the last 168 hours.   Radiology    Dg Chest 2 View  Result Date: 12/19/2016  CLINICAL DATA:  anterior chest pain EXAM: CHEST  2 VIEW COMPARISON:  None. FINDINGS: Sternotomy wires overlie normal cardiac silhouette. No effusion, infiltrate pneumothorax. Lungs are hyperinflated. Degenerative osteophytosis of the spine. IMPRESSION: Hyperinflated lungs.  No acute findings. Post CABG. Electronically Signed   By: Suzy Bouchard M.D.   On: 12/19/2016 12:59    Cardiac Studies   Echo 10/20/16 Study Conclusions  - Left ventricle: The cavity size was normal. There was mild   concentric hypertrophy. Systolic function was normal. The   estimated ejection fraction was in the range of 55% to 60%. Wall   motion was normal; there were no regional wall motion   abnormalities. Doppler parameters are consistent with abnormal   left ventricular relaxation (grade 1 diastolic dysfunction). - Mitral valve: Calcified annulus. - Left atrium: The atrium was mildly dilated. - Atrial septum: No defect or patent foramen ovale was identified.  Patient Profile     82 y.o. male followed by Dr. Lovena Le, with a h/o ischemic heart disease/CAD, status post bypass surgery, status post stenting, who also has a history of atrial flutter status post catheter  ablation and a history of atrial fibrillation, HTN and COPD, presenting to the ED with CC of Chest pain.   His last [facility] 06/2015 showed severe 3 vessel occlusive CAD, Patent LIMA to the LAD, Patent SVG to the first diagonal, Patent SVG to the RCA and Severely diseased SVG to the OM (unchanged from 2013 and not amendable to PCI). Medical therapy elected.   Assessment & Plan    1. Unstable angina - treated with IV heparin and Imdur (new) for 2 days. Per Dr. Lovena Le not a good candidate for invasive treatment due to graft disease and elevated creatinine. - Troponin trend 0.58-->0.98-->0.52-->0.55. Echo with normal LVEF of 55-60%, no wm abnormality. He ate today.  - Will get troponin to see clear trend. No trending down, no cath. His jaw pain has been resolved. Likely home today or tomorrow if remains pain free with ambulation and trending down troponin.  Up titrate imdur as needed.   2. HTN - BP now stable on current medications of imdur and hydralazine.   3. Sinus node dysfunction - He is bradycardic even not on any AV blocking agent. He may required PPM eventually.   4. HLD - 12/20/2016: Cholesterol 177; HDL 36; LDL Cholesterol 99; Triglycerides 208; VLDL 42  - Statin intolerance. Consider lipid clinic evaluation as outpatient for PCSK9 inhibitor. LDL goal less than 70.  Signed, Leanor Kail, PA  12/21/2016, 9:31 AM    Patient seen and examined and history reviewed. Agree with above findings and plan. Omar Benson feels well today. Denies any chest pain or jaw pain. Has not been ambulatory. Did not take hydralazine last night because he was concerned that BP got too low. On exam lungs are clear. No gallop or murmur. No edema Renal function is a little worse with creatinine 1.88. Troponin trend is flat with last level 0.55. Echo shows good LV function. Plan: will DC IV heparin. I encouraged him to take his medication as prescribed especially while here so we can monitor his response and adjust if  needed. Will ambulate. Cardiac Rehab. Encouraged outpatient Cardiac Rehab as well. If stable we will consider DC in am.  Omar Benson, Omar Benson 12/21/2016 10:42 AM

## 2016-12-21 NOTE — Plan of Care (Signed)
Problem: Activity: Goal: Risk for activity intolerance will decrease Outcome: Progressing Pt ambulating in hallway without chest pain

## 2016-12-21 NOTE — Care Management Note (Addendum)
Case Management Note  Patient Details  Name: MATHIUS BIRKELAND MRN: [id number] Date of Birth: [birth year]  Subjective/Objective:   82 year old male admitted 8/4 with jaw and chest pain, on IV Heparin, medication adjustments  Action/Plan: Discharge Planning: NCM spoke to pt and wife at bedside. , Lives at home with wife, PTA pt was independent with ADL's.                Able to afford medications. Will continue to follow for dc needs.   PCP [name] MD  12/22/2016 1042 No NCM needs identified.  Expected Discharge Date:    12/22/2016            Expected Discharge Plan:  Home/Self Care  In-House Referral:  NA  Discharge planning Services  CM Consult  Post Acute Care Choice:  NA Choice offered to:  NA  DME Arranged:  N/A DME Agency:  NA  HH Arranged:  NA HH Agency:  NA  Status of Service:  Completed, signed off  If discussed at [place] of Stay Meetings, dates discussed:    Additional Comments:  Erenest Rasher, RN 12/21/2016, 12:27 PM

## 2016-12-22 ENCOUNTER — Telehealth: Payer: Self-pay

## 2016-12-22 DIAGNOSIS — I10 Essential (primary) hypertension: Secondary | ICD-10-CM

## 2016-12-22 DIAGNOSIS — I214 Non-ST elevation (NSTEMI) myocardial infarction: Principal | ICD-10-CM

## 2016-12-22 LAB — BASIC METABOLIC PANEL
Anion gap: 9 (ref 5–15)
BUN: 19 mg/dL (ref 6–20)
CHLORIDE: 104 mmol/L (ref 101–111)
CO2: 26 mmol/L (ref 22–32)
CREATININE: 1.79 mg/dL — AB (ref 0.61–1.24)
Calcium: 9.6 mg/dL (ref 8.9–10.3)
GFR calc Af Amer: 40 mL/min — ABNORMAL LOW (ref >60)
GFR calc non Af Amer: 34 mL/min — ABNORMAL LOW (ref >60)
Glucose, Bld: 86 mg/dL (ref 65–99)
POTASSIUM: 4.7 mmol/L (ref 3.5–5.1)
SODIUM: 139 mmol/L (ref 135–145)

## 2016-12-22 MED ORDER — HYDRALAZINE HCL 25 MG PO TABS: 25.0000 mg | ORAL_TABLET | Freq: Two times a day (BID) | ORAL | 6 refills | Status: DC

## 2016-12-22 MED ORDER — ISOSORBIDE MONONITRATE ER 30 MG PO TB24: 15.0000 mg | ORAL_TABLET | Freq: Every day | ORAL | 6 refills | Status: DC

## 2016-12-22 NOTE — Progress Notes (Signed)
CARDIAC REHAB PHASE I   Briefly spoke with pt, he is ambulating independently, no complaints. Pt agrees to phase 2 cardiac rehab referral, will send to [facility].   Lenna Sciara, RN, BSN 12/22/2016 9:47 AM

## 2016-12-22 NOTE — Progress Notes (Signed)
Progress Note  Patient Name: Omar Benson Date of Encounter: 12/22/2016  Primary Cardiologist:   Subjective   Feeling well. No chest pain, sob or palpitations.   Inpatient Medications    Scheduled Meds: . allopurinol  300 mg Oral QHS  . amiodarone  100 mg Oral QHS  . aspirin EC  81 mg Oral Daily  . hydrALAZINE  25 mg Oral Q8H  . isosorbide mononitrate  15 mg Oral Daily  . levothyroxine  125 mcg Oral QAC breakfast  . magnesium oxide  200 mg Oral QHS  . pantoprazole  40 mg Oral QHS   Continuous Infusions:  PRN Meds: acetaminophen, morphine injection, nitroGLYCERIN, ondansetron (ZOFRAN) IV, zolpidem   Vital Signs    Vitals:   12/22/16 0200 12/22/16 0231 12/22/16 0512 12/22/16 0600  BP: 124/73 127/72 125/80 130/77  Pulse:   (!) 52   Resp:   15   Temp:   98.2 F (36.8 C)   TempSrc:      SpO2:   95%   Weight:   179 lb 3.2 oz (81.3 kg)   Height:        Intake/Output Summary (Last 24 hours) at 12/22/16 0800 Last data filed at 12/22/16 0513  Gross per 24 hour  Intake              360 ml  Output              250 ml  Net              110 ml   Filed Weights   12/20/16 0100 12/21/16 0448 12/22/16 0512  Weight: 178 lb 9.2 oz (81 kg) 170 lb (77.1 kg) 179 lb 3.2 oz (81.3 kg)    Telemetry    Sinus bradycardia at rate of high 40s- Personally Reviewed  ECG    None today   Physical Exam   GEN: No acute distress.   Neck: No JVD Cardiac: RRR, no murmurs, rubs, or gallops.  Respiratory: Clear to auscultation bilaterally. GI: Soft, nontender, non-distended  MS: No edema; No deformity. Neuro:  Nonfocal  Psych: Normal affect   Labs    Chemistry Recent Labs Lab 12/20/16 0202 12/21/16 0213 12/22/16 0222  NA 140 140 139  K 4.2 4.5 4.7  CL 107 107 104  CO2 25 27 26   GLUCOSE 94 100* 86  BUN 20 22* 19  CREATININE 1.68* 1.88* 1.79*  CALCIUM 9.0 9.3 9.6  GFRNONAA 37* 32* 34*  GFRAA 43* 37* 40*  ANIONGAP 8 6 9      Hematology Recent Labs Lab  12/19/16 2246 12/20/16 0202 12/21/16 0213  WBC 5.2 5.0 5.6  RBC 4.10* 4.12* 4.13*  HGB 13.0 12.9* 13.1  HCT 37.9* 38.3* 38.3*  MCV 92.4 93.0 92.7  MCH 31.7 31.3 31.7  MCHC 34.3 33.7 34.2  RDW 13.8 13.9 14.1  PLT 177 181 184    Cardiac Enzymes Recent Labs Lab 12/20/16 1929 12/21/16 0213 12/21/16 0924 12/21/16 1512  TROPONINI 0.52* 0.55* 0.42* 0.33*    Recent Labs Lab 12/19/16 1227 12/19/16 1605  TROPIPOC 0.00 0.04     Radiology    No results found.  Cardiac Studies   Echo 10/20/16 Study Conclusions  - Left ventricle: The cavity size was normal. There was mild concentric hypertrophy. Systolic function was normal. The estimated ejection fraction was in the range of 55% to 60%. Wall motion was normal; there were no regional wall motion abnormalities. Doppler parameters are consistent with abnormal  left ventricular relaxation (grade 1 diastolic dysfunction). - Mitral valve: Calcified annulus. - Left atrium: The atrium was mildly dilated. - Atrial septum: No defect or patent foramen ovale was identified.  Patient Profile     82 y.o. male followed by Dr. Lovena Le, with a h/oischemic heart disease/CAD, status post bypass surgery, status post stenting, who also has a history of atrial flutter status post catheter ablation and a history of atrial fibrillation, HTN and COPD, presenting to the ED with CC of Chest pain.   His last [facility] 06/2015 showed severe 3 vessel occlusive CAD, Patent LIMA to the LAD, Patent SVG to the first diagonal,Patent SVG to the RCA and Severely diseased SVG to the OM (unchanged from 2013 and not amendable to PCI). Medical therapy elected.   Assessment & Plan    1. NSTEMI - treated with IV heparin and Imdur (new) for 2 days. Per Dr. Lovena Le not a good candidate for invasive treatment due to graft disease and elevated creatinine. Peak of troponin 0.98. Echo with normal LVEF of 55-60%, no wm abnormality. Ambulated weill. Will enroll in  cardiac rehab phase II. Continue asa and imdur.   2. HTN - BP elevated at times. Continue imdur. Intermittently refusing  Hydralazine--> discussed importance of taking at scheduled time. MD to decide discharge dose and frequency.   3. Sinus node dysfunction - He is bradycardic even not on any AV blocking agent. He may required PPM eventually.   4. HLD - 12/20/2016: Cholesterol 177; HDL 36; LDL Cholesterol 99; Triglycerides 208; VLDL 42  - Statin intolerance. Consider lipid clinic evaluation as outpatient for PCSK9 inhibitor. LDL goal less than 70.   Signed, Leanor Kail, PA  12/22/2016, 8:00 AM    Patient seen and examined and history reviewed. Agree with above findings and plan. No recurrent angina off IV heparin. Ambulating in halls. Not taking BP meds as prescribed. Only took hydralazine once yesterday. BP still running a little high. Afraid hydralazine will drop BP too low. We compromised and will start it at 25 mg bid. He will monitor BP at home. Will get him enrolled in phase 2 Cardiac Rehab. OK for DC today.  Omar Benson, [place] 12/22/2016 9:29 AM

## 2016-12-22 NOTE — Telephone Encounter (Signed)
Pt on TCM List and admitted for chest pain with high risk cardiac etiology. Pt to follow up with Cardiology.

## 2016-12-22 NOTE — Discharge Summary (Signed)
Discharge Summary    Patient ID: Omar Benson,  MRN: [id number], DOB/AGE: 12-15-1934 82 y.o.  Admit date: 12/19/2016 Discharge date: 12/22/2016  Primary Care Provider: Biagio Benson Primary Cardiologist: Dr. Lovena Benson  Discharge Diagnoses    Principal Problem:   Non-ST elevation (NSTEMI) myocardial infarction Madison Benson) Active Problems:   Hyperlipidemia LDL goal <70   Unstable angina (HCC)   Chest pain with moderate risk for cardiac etiology   Allergies Allergies  Allergen Reactions  . Fish Allergy Diarrhea and Nausea And Vomiting  . Promethazine Hcl Other (See Comments)    Syncope (reaction to phenergan)  . Shellfish Allergy Diarrhea and Nausea And Vomiting  . Yellow Jacket Venom [Bee Venom] Swelling    Localized swelling from yellow jackets and honey bees  . Doxycycline Hyclate Other (See Comments)    esophagitis  . Lipitor [Atorvastatin Calcium] Other (See Comments)    Myalgia/myopathy  . Tetracycline Other (See Comments)    Esophagitis  . Crestor [Rosuvastatin] Other (See Comments)    Myalgia/myopathy  . Tetanus Toxoid Swelling and Rash    Diagnostic Studies/Procedures    Echo 10/20/16 Study Conclusions  - Left ventricle: The cavity size was normal. There was mild concentric hypertrophy. Systolic function was normal. The estimated ejection fraction was in the range of 55% to 60%. Wall motion was normal; there were no regional wall motion abnormalities. Doppler parameters are consistent with abnormal left ventricular relaxation (grade 1 diastolic dysfunction). - Mitral valve: Calcified annulus. - Left atrium: The atrium was mildly dilated. - Atrial septum: No defect or patent foramen ovale was identified   History of Present Illness     82 y.o.malefollowed by Dr. Lovena Benson, with a h/oischemic heart disease/CAD, status post bypass surgery, status post stenting, who also has a history of atrial flutter status post catheter ablation and a history of atrial  fibrillation, HTN and COPD, presenting to the ED with CC of Chest pain.   His last Omar Benson 06/2015 showed severe 3 vessel occlusive CAD, Patent LIMA to the LAD, Patent SVG to the first diagonal,Patent SVG to the RCA and Severely diseased SVG to the OM (unchanged from 2013 and not amendable to PCI). Medical therapy elected.   Benson Course     Consultants: None   1. NSTEMI - treated with IV heparin and Imdur (new) for 2 days. Per Dr. Lovena Benson not a good candidate for invasive treatment due to graft disease and elevated creatinine. Peak of troponin 0.98. Echo with normal LVEF of 55-60%, no wm abnormality. Ambulated well. No recurrent angina. He will enroll in cardiac rehab phase II. Continue asa and imdur.   2. HTN - BP elevated at times.. Intermittently refusing  Hydralazine--> Afraid hydralazine will drop BP too low. Siscussed importance of taking at scheduled time and monitoring at closely. We compromised and will start it at 25 mg bid. He will monitor BP at home.  3. Sinus node dysfunction - He is bradycardic even not on any AV blocking agent. He may required PPM eventually.   4. HLD - 12/20/2016: Cholesterol 177; HDL 36; LDL Cholesterol 99; Triglycerides 208; VLDL 42 - Statin intolerance. Consider lipid clinic evaluation as outpatient for PCSK9 inhibitor. LDL goal less than 70.  The patient has been seen by Dr. [place] today and deemed ready for discharge home. All follow-up appointments have been scheduled. Discharge medications are listed below.   Discharge Vitals Blood pressure 139/78, pulse (!) 48, temperature 98.6 F (37 C), temperature source Oral, resp. rate  18, height 5' 7.01" (1.702 m), weight 179 lb 3.2 oz (81.3 kg), SpO2 98 %.  Filed Weights   12/20/16 0100 12/21/16 0448 12/22/16 0512  Weight: 178 lb 9.2 oz (81 kg) 170 lb (77.1 kg) 179 lb 3.2 oz (81.3 kg)    Labs & Radiologic Studies     CBC  Recent Labs  12/20/16 0202 12/21/16 0213  WBC 5.0 5.6  HGB 12.9* 13.1    HCT 38.3* 38.3*  MCV 93.0 92.7  PLT 181 254   Basic Metabolic Panel  Recent Labs  12/21/16 0213 12/22/16 0222  NA 140 139  K 4.5 4.7  CL 107 104  CO2 27 26  GLUCOSE 100* 86  BUN 22* 19  CREATININE 1.88* 1.79*  CALCIUM 9.3 9.6   Liver Function Tests No results for input(s): AST, ALT, ALKPHOS, BILITOT, PROT, ALBUMIN in the last 72 hours. No results for input(s): LIPASE, AMYLASE in the last 72 hours. Cardiac Enzymes  Recent Labs  12/21/16 0213 12/21/16 0924 12/21/16 1512  TROPONINI 0.55* 0.42* 0.33*   BNP Invalid input(s): POCBNP D-Dimer No results for input(s): DDIMER in the last 72 hours. Hemoglobin A1C No results for input(s): HGBA1C in the last 72 hours. Fasting Lipid Panel  Recent Labs  12/20/16 0202  CHOL 177  HDL 36*  LDLCALC 99  TRIG 208*  CHOLHDL 4.9   Thyroid Function Tests No results for input(s): TSH, T4TOTAL, T3FREE, THYROIDAB in the last 72 hours.  Invalid input(s): FREET3  Dg Chest 2 View  Result Date: 12/19/2016 CLINICAL DATA:  anterior chest pain EXAM: CHEST  2 VIEW COMPARISON:  None. FINDINGS: Sternotomy wires overlie normal cardiac silhouette. No effusion, infiltrate pneumothorax. Lungs are hyperinflated. Degenerative osteophytosis of the spine. IMPRESSION: Hyperinflated lungs.  No acute findings. Post CABG. Electronically Signed   By: Suzy Bouchard M.D.   On: 12/19/2016 12:59    Disposition   Pt is being discharged home today in good condition.  Follow-up Plans & Appointments    Follow-up Information    Omar Berthold, NP. Go on 01/15/2017.   Specialty:  Cardiology Why:  @ 9:40 am for cardiology Benson follow up Contact information: [place] [id number] [phone]          Discharge Instructions    Amb Referral to Cardiac Rehabilitation    Complete by:  As directed    Diagnosis:  NSTEMI   Diet - low sodium heart healthy    Complete by:  As directed    Increase activity slowly    Complete by:   As directed       Discharge Medications   Current Discharge Medication List    START taking these medications   Details  hydrALAZINE (APRESOLINE) 25 MG tablet Take 1 tablet (25 mg total) by mouth 2 (two) times daily. Qty: 60 tablet, Refills: 6    isosorbide mononitrate (IMDUR) 30 MG 24 hr tablet Take 0.5 tablets (15 mg total) by mouth daily. Qty: 30 tablet, Refills: 6      CONTINUE these medications which have NOT CHANGED   Details  allopurinol (ZYLOPRIM) 300 MG tablet Take 1 tablet (300 mg total) by mouth daily. Qty: 90 tablet, Refills: 3    amiodarone (PACERONE) 200 MG tablet Take 0.5 tablets (100 mg total) by mouth daily. Qty: 45 tablet, Refills: 3   Associated Diagnoses: Paroxysmal atrial fibrillation (HCC)    aspirin 81 MG tablet Take 162 mg by mouth at bedtime.     b  complex vitamins tablet Take 1 tablet by mouth at bedtime.     Cholecalciferol (VITAMIN D) 2000 UNITS CAPS Take 2,000 Units by mouth at bedtime.     EPINEPHrine (EPIPEN) 0.3 mg/0.3 mL SOAJ injection Inject 0.3 mLs (0.3 mg total) into the muscle once. Qty: 2 Device, Refills: 2   Associated Diagnoses: Bee sting, initial encounter    levothyroxine (SYNTHROID, LEVOTHROID) 125 MCG tablet Take 1 tablet (125 mcg total) by mouth daily. Qty: 90 tablet, Refills: 3    Magnesium 250 MG TABS Take 250 mg by mouth at bedtime.    Multiple Vitamin (MULTIVITAMIN WITH MINERALS) TABS tablet Take 1 tablet by mouth at bedtime.    neomycin-bacitracin-polymyxin (NEOSPORIN) OINT Apply 1 application topically daily as needed for wound care.    nitroGLYCERIN (NITROSTAT) 0.4 MG SL tablet PLACE 1 TABLET UNDER THE TONGUE EVERY 5 MINUTES AS NEEDED FOR CHEST PAIN FOR UP TO 3 DOSES. Qty: 25 tablet, Refills: 5    pantoprazole (PROTONIX) 40 MG tablet Take 1 tablet (40 mg total) by mouth daily. Yearly physical due in February must see MD for refills Qty: 90 tablet, Refills: 3    tiotropium (SPIRIVA HANDIHALER) 18 MCG inhalation  capsule PLACE 1 CAPSULE (18 MCG TOTAL) INTO INHALER AND INHALE DAILY. Qty: 90 capsule, Refills: 3    vardenafil (LEVITRA) 20 MG tablet Take 1 tablet (20 mg total) by mouth as needed for erectile dysfunction. Qty: 5 tablet, Refills: 11    vitamin C (ASCORBIC ACID) 500 MG tablet Take 500 mg by mouth at bedtime.     acetaminophen (TYLENOL) 325 MG tablet Take 2 tablets (650 mg total) by mouth every 6 (six) hours as needed for moderate pain. Qty: 20 tablet, Refills: 0    albuterol (PROVENTIL HFA;VENTOLIN HFA) 108 (90 Base) MCG/ACT inhaler Inhale 2 puffs into the lungs every 6 (six) hours as needed for wheezing or shortness of breath. Reported on 08/09/2015 Qty: 3 Inhaler, Refills: 3           Outstanding Labs/Studies   None   Duration of Discharge Encounter   Greater than 30 minutes including physician time.  Signed, Maanav Kassabian PA-C 12/22/2016, 10:00 AM

## 2016-12-22 NOTE — Progress Notes (Signed)
Pt has been intermittently refusing hydralazine. Pt states he is afraid it will drop his blood pressure. Carroll Kinds RN

## 2016-12-24 ENCOUNTER — Telehealth: Payer: Self-pay | Admitting: Internal Medicine

## 2016-12-24 MED ORDER — HYDRALAZINE HCL 50 MG PO TABS: 50.0000 mg | ORAL_TABLET | Freq: Two times a day (BID) | ORAL | Status: DC

## 2016-12-24 NOTE — Telephone Encounter (Signed)
New message       Pt c/o BP issue: STAT if pt c/o blurred vision, one-sided weakness or slurred speech  1. What are your last 5 BP readings?  207/103 this am 2. Are you having any other symptoms (ex. Dizziness, headache, blurred vision, passed out)?  no  3. What is your BP issue?  Pt states he was discharged from the hosp on 12-19-16 with an heart attack.  Please advise

## 2016-12-24 NOTE — Telephone Encounter (Addendum)
Pt was on his way to this office when he called because his BP was elevated this morning  At about 7:00 AM 207/103 pt took  Apresoline at 5 AM and Imdur 15 mg early this AM  at 7:45 AM. Pt's BP now at 9 AM is 173/86. Pt denies any other symptoms. He  is very apprehensive because he was D/C from the hospital on 12/19/16 for NSTEMI Myocardial infarction. Dr Curt Bears MD aware of pt's high BP. MD recommends for pt to increased the Apresoline to 50 mg twice a day, and to F/U with his PCP for BP management. Pt is aware he verbalized understanding.

## 2016-12-29 ENCOUNTER — Encounter (HOSPITAL_COMMUNITY): Payer: Self-pay

## 2016-12-29 ENCOUNTER — Inpatient Hospital Stay (HOSPITAL_COMMUNITY)
Admission: EM | Admit: 2016-12-29 | Discharge: 2017-01-01 | DRG: 247 | Disposition: A | Discharge status: Home / Self Care | Payer: Medicare Other | Attending: Internal Medicine | Admitting: Internal Medicine

## 2016-12-29 ENCOUNTER — Emergency Department (HOSPITAL_COMMUNITY): Payer: Medicare Other

## 2016-12-29 DIAGNOSIS — N183 Chronic kidney disease, stage 3 unspecified: Secondary | ICD-10-CM | POA: Diagnosis present

## 2016-12-29 DIAGNOSIS — I200 Unstable angina: Secondary | ICD-10-CM | POA: Diagnosis present

## 2016-12-29 DIAGNOSIS — K5730 Diverticulosis of large intestine without perforation or abscess without bleeding: Secondary | ICD-10-CM | POA: Diagnosis present

## 2016-12-29 DIAGNOSIS — Z7982 Long term (current) use of aspirin: Secondary | ICD-10-CM

## 2016-12-29 DIAGNOSIS — Z8249 Family history of ischemic heart disease and other diseases of the circulatory system: Secondary | ICD-10-CM

## 2016-12-29 DIAGNOSIS — I25110 Atherosclerotic heart disease of native coronary artery with unstable angina pectoris: Secondary | ICD-10-CM | POA: Diagnosis not present

## 2016-12-29 DIAGNOSIS — I739 Peripheral vascular disease, unspecified: Secondary | ICD-10-CM | POA: Diagnosis present

## 2016-12-29 DIAGNOSIS — K219 Gastro-esophageal reflux disease without esophagitis: Secondary | ICD-10-CM | POA: Diagnosis present

## 2016-12-29 DIAGNOSIS — J449 Chronic obstructive pulmonary disease, unspecified: Secondary | ICD-10-CM | POA: Diagnosis not present

## 2016-12-29 DIAGNOSIS — Z951 Presence of aortocoronary bypass graft: Secondary | ICD-10-CM

## 2016-12-29 DIAGNOSIS — I4510 Unspecified right bundle-branch block: Secondary | ICD-10-CM | POA: Diagnosis not present

## 2016-12-29 DIAGNOSIS — I214 Non-ST elevation (NSTEMI) myocardial infarction: Secondary | ICD-10-CM | POA: Diagnosis present

## 2016-12-29 DIAGNOSIS — Z888 Allergy status to other drugs, medicaments and biological substances status: Secondary | ICD-10-CM

## 2016-12-29 DIAGNOSIS — E039 Hypothyroidism, unspecified: Secondary | ICD-10-CM | POA: Diagnosis present

## 2016-12-29 DIAGNOSIS — I25710 Atherosclerosis of autologous vein coronary artery bypass graft(s) with unstable angina pectoris: Secondary | ICD-10-CM | POA: Diagnosis not present

## 2016-12-29 DIAGNOSIS — I2582 Chronic total occlusion of coronary artery: Secondary | ICD-10-CM | POA: Diagnosis present

## 2016-12-29 DIAGNOSIS — Z79899 Other long term (current) drug therapy: Secondary | ICD-10-CM

## 2016-12-29 DIAGNOSIS — R001 Bradycardia, unspecified: Secondary | ICD-10-CM | POA: Diagnosis present

## 2016-12-29 DIAGNOSIS — I25810 Atherosclerosis of coronary artery bypass graft(s) without angina pectoris: Secondary | ICD-10-CM | POA: Diagnosis not present

## 2016-12-29 DIAGNOSIS — I222 Subsequent non-ST elevation (NSTEMI) myocardial infarction: Secondary | ICD-10-CM | POA: Diagnosis not present

## 2016-12-29 DIAGNOSIS — N179 Acute kidney failure, unspecified: Secondary | ICD-10-CM | POA: Diagnosis present

## 2016-12-29 DIAGNOSIS — I129 Hypertensive chronic kidney disease with stage 1 through stage 4 chronic kidney disease, or unspecified chronic kidney disease: Secondary | ICD-10-CM | POA: Diagnosis present

## 2016-12-29 DIAGNOSIS — Z91013 Allergy to seafood: Secondary | ICD-10-CM | POA: Diagnosis not present

## 2016-12-29 DIAGNOSIS — I252 Old myocardial infarction: Secondary | ICD-10-CM

## 2016-12-29 DIAGNOSIS — I4892 Unspecified atrial flutter: Secondary | ICD-10-CM | POA: Diagnosis not present

## 2016-12-29 DIAGNOSIS — I2510 Atherosclerotic heart disease of native coronary artery without angina pectoris: Secondary | ICD-10-CM | POA: Diagnosis present

## 2016-12-29 DIAGNOSIS — Z87891 Personal history of nicotine dependence: Secondary | ICD-10-CM | POA: Diagnosis not present

## 2016-12-29 DIAGNOSIS — F419 Anxiety disorder, unspecified: Secondary | ICD-10-CM | POA: Diagnosis not present

## 2016-12-29 DIAGNOSIS — N189 Chronic kidney disease, unspecified: Secondary | ICD-10-CM

## 2016-12-29 DIAGNOSIS — E785 Hyperlipidemia, unspecified: Secondary | ICD-10-CM | POA: Diagnosis present

## 2016-12-29 DIAGNOSIS — Z955 Presence of coronary angioplasty implant and graft: Secondary | ICD-10-CM

## 2016-12-29 DIAGNOSIS — R079 Chest pain, unspecified: Secondary | ICD-10-CM | POA: Diagnosis not present

## 2016-12-29 HISTORY — DX: Anxiety disorder, unspecified: F41.9

## 2016-12-29 HISTORY — DX: Dorsalgia, unspecified: M54.9

## 2016-12-29 HISTORY — DX: Emphysema, unspecified: J43.9

## 2016-12-29 HISTORY — DX: Personal history of urinary calculi: Z87.442

## 2016-12-29 HISTORY — DX: Personal history of other medical treatment: Z92.89

## 2016-12-29 HISTORY — DX: Other allergy status, other than to drugs and biological substances: Z91.09

## 2016-12-29 HISTORY — DX: Other chronic pain: G89.29

## 2016-12-29 LAB — I-STAT TROPONIN, ED
TROPONIN I, POC: 0.02 ng/mL (ref 0.00–0.08)
TROPONIN I, POC: 0.05 ng/mL (ref 0.00–0.08)

## 2016-12-29 LAB — BASIC METABOLIC PANEL
Anion gap: 9 (ref 5–15)
BUN: 35 mg/dL — ABNORMAL HIGH (ref 6–20)
CALCIUM: 9.4 mg/dL (ref 8.9–10.3)
CO2: 20 mmol/L — AB (ref 22–32)
CREATININE: 2.11 mg/dL — AB (ref 0.61–1.24)
Chloride: 108 mmol/L (ref 101–111)
GFR calc Af Amer: 32 mL/min — ABNORMAL LOW (ref >60)
GFR calc non Af Amer: 28 mL/min — ABNORMAL LOW (ref >60)
GLUCOSE: 115 mg/dL — AB (ref 65–99)
Potassium: 4.6 mmol/L (ref 3.5–5.1)
Sodium: 137 mmol/L (ref 135–145)

## 2016-12-29 LAB — CBC
HCT: 38.7 % — ABNORMAL LOW (ref 39.0–52.0)
HEMOGLOBIN: 13.1 g/dL (ref 13.0–17.0)
MCH: 31.4 pg (ref 26.0–34.0)
MCHC: 33.9 g/dL (ref 30.0–36.0)
MCV: 92.8 fL (ref 78.0–100.0)
PLATELETS: 217 10*3/uL (ref 150–400)
RBC: 4.17 MIL/uL — ABNORMAL LOW (ref 4.22–5.81)
RDW: 14.1 % (ref 11.5–15.5)
WBC: 6.8 10*3/uL (ref 4.0–10.5)

## 2016-12-29 LAB — TROPONIN I: Troponin I: 0.06 ng/mL (ref <0.03)

## 2016-12-29 MED ORDER — HYDRALAZINE HCL 50 MG PO TABS
75.0000 mg | ORAL_TABLET | Freq: Two times a day (BID) | ORAL | Status: DC
  Administered 2016-12-29 – 2017-01-01 (×6): 75 mg via ORAL
  Filled 2016-12-29 (×7): qty 1

## 2016-12-29 MED ORDER — TIOTROPIUM BROMIDE MONOHYDRATE 18 MCG IN CAPS
18.0000 ug | ORAL_CAPSULE | Freq: Every day | RESPIRATORY_TRACT | Status: DC
  Administered 2016-12-31: 18 ug via RESPIRATORY_TRACT
  Filled 2016-12-29 (×2): qty 5

## 2016-12-29 MED ORDER — ALLOPURINOL 300 MG PO TABS
300.0000 mg | ORAL_TABLET | Freq: Every day | ORAL | Status: DC
  Administered 2016-12-29 – 2016-12-31 (×3): 300 mg via ORAL
  Filled 2016-12-29 (×4): qty 1

## 2016-12-29 MED ORDER — ACETAMINOPHEN 325 MG PO TABS
650.0000 mg | ORAL_TABLET | Freq: Four times a day (QID) | ORAL | Status: DC | PRN
  Administered 2016-12-29: 650 mg via ORAL
  Filled 2016-12-29: qty 2

## 2016-12-29 MED ORDER — HEPARIN (PORCINE) IN NACL 100-0.45 UNIT/ML-% IJ SOLN
1100.0000 [IU]/h | INTRAMUSCULAR | Status: DC
  Administered 2016-12-29: 1100 [IU]/h via INTRAVENOUS
  Filled 2016-12-29 (×2): qty 250

## 2016-12-29 MED ORDER — ADULT MULTIVITAMIN W/MINERALS CH
1.0000 | ORAL_TABLET | Freq: Every day | ORAL | Status: DC
  Administered 2016-12-30 – 2016-12-31 (×2): 1 via ORAL
  Filled 2016-12-29 (×2): qty 1

## 2016-12-29 MED ORDER — AMIODARONE HCL 200 MG PO TABS
100.0000 mg | ORAL_TABLET | Freq: Every day | ORAL | Status: DC
  Administered 2016-12-29 – 2016-12-31 (×3): 100 mg via ORAL
  Filled 2016-12-29 (×3): qty 1

## 2016-12-29 MED ORDER — ASPIRIN EC 81 MG PO TBEC
81.0000 mg | DELAYED_RELEASE_TABLET | Freq: Every day | ORAL | Status: DC
  Administered 2016-12-29 – 2016-12-30 (×2): 81 mg via ORAL
  Filled 2016-12-29 (×2): qty 1

## 2016-12-29 MED ORDER — NITROGLYCERIN 0.4 MG SL SUBL
0.4000 mg | SUBLINGUAL_TABLET | SUBLINGUAL | Status: DC | PRN
  Administered 2016-12-29: 0.4 mg via SUBLINGUAL
  Filled 2016-12-29: qty 1

## 2016-12-29 MED ORDER — SODIUM CHLORIDE 0.9 % IV SOLN
INTRAVENOUS | Status: DC
  Administered 2016-12-29 – 2016-12-30 (×3): via INTRAVENOUS

## 2016-12-29 MED ORDER — LEVOTHYROXINE SODIUM 100 MCG PO TABS
125.0000 ug | ORAL_TABLET | Freq: Every day | ORAL | Status: DC
  Administered 2016-12-30 – 2017-01-01 (×3): 125 ug via ORAL
  Filled 2016-12-29 (×4): qty 1

## 2016-12-29 MED ORDER — ONDANSETRON HCL 4 MG/2ML IJ SOLN: 4.0000 mg | Freq: Four times a day (QID) | INTRAMUSCULAR | Status: DC | PRN

## 2016-12-29 MED ORDER — NITROGLYCERIN 2 % TD OINT
0.5000 [in_us] | TOPICAL_OINTMENT | Freq: Four times a day (QID) | TRANSDERMAL | Status: DC
  Administered 2016-12-29 – 2016-12-30 (×2): 0.5 [in_us] via TOPICAL
  Filled 2016-12-29: qty 30

## 2016-12-29 MED ORDER — PANTOPRAZOLE SODIUM 40 MG PO TBEC
40.0000 mg | DELAYED_RELEASE_TABLET | Freq: Every day | ORAL | Status: DC
  Administered 2016-12-29 – 2016-12-31 (×3): 40 mg via ORAL
  Filled 2016-12-29 (×3): qty 1

## 2016-12-29 NOTE — ED Notes (Signed)
Attempted report 

## 2016-12-29 NOTE — ED Notes (Signed)
MD at bedside. 

## 2016-12-29 NOTE — Progress Notes (Signed)
New Admission Note:   Arrival Method: ED bed with Ed nurses, ambulated to unit bed Mental Orientation: A&O x4 Telemetry: initiated Pain: "just a little back pain"  Safety Measures: educated about safety Family: at bedside  Orders to be reviewed and implemented. Will continue to monitor the patient. Call light has been placed within reach and bed alarm has been activated.   Riki Altes, RN Phone: [phone]

## 2016-12-29 NOTE — ED Provider Notes (Signed)
[facility] DEPT Provider Note   CSN: [id number] Arrival date & time: 12/29/16  1559    History   Chief Complaint Chief Complaint  Patient presents with  . Chest Pain    HPI Omar Benson is a 82 y.o. male.  Patient with history of CAD s/p bypass and stenting, AAA, Afib/Aflutter s/p ablation, CKD III, COPD, GERD with recent hospitalization on 12/19/16 with NSTEMI found to be poor interventinal candidate and was treated with heparin and imdur and discharged on ASA and Imdur. He is presenting today with 6-7/10 achy chest pain starting around 3:30 the pain radiates to his jaw and left shoulder. Of note, his usual cardiac pain is 3-4/10 Jaw/tongue pain. He missed his Imdur dose at noon and took it at 3:30. He also to 3 nitroglycerines over time following the onset of his chest pain, which relieved the pain. The pain has since returned. He denies increased SOB, Nausea, or diaphoresis. He endorses headache.     Past Medical History:  Diagnosis Date  . AAA 09/19/2008  . Allergy   . ANEMIA-IRON DEFICIENCY 09/19/2008  . ANXIETY 06/13/2010  . Arthritis    no meds - knee/back  . Atrial fibrillation ([place]) 04/19/2008   amiodarone rx;  Echocardiogram 05/26/11: No wall motion abnormalities, mild LVH, EF 60%.  . Atrial flutter ([place]) 12/05/2008   s/p RFCA - ablation  . Benign esophageal stricture 09/2075   dilated during EGD  . Blood transfusion without reported diagnosis 2013   [facility] - 2 units transfused  . BRADYCARDIA 12/05/2008  . CAD 12/05/2008   s/p CABG; NSTEMI 05/2011 - LHC 05/25/11: LAD occluded, LIMA-LAD patent, ostial circumflex 50%, AV circumflex 70%, SVG-OM2 with extensive disease with thrombus (culprit vessel), RCA 80% and occluded, SVG-intermediate patent, SVG-PDA/PL A patent.  Given that the culprit vessel was a subtotally occluded heavily thrombotic graft to a smaller OM2, medical therapy was recommended.;   . Cataract    left - small - no treatment yet  . CKD (chronic kidney disease)  09/19/2008   Qualifier: Diagnosis of  By: Jenny Reichmann MD, Hunt Oris  -- Stage 3  . COLONIC POLYPS, HX OF 09/19/2008   adenomatous polyps 07/2010  . COPD (chronic obstructive pulmonary disease) ([place])   . CORONARY ARTERY BYPASS GRAFT, HX OF 12/05/2008   A. LIMA-LAD, VG-RI, VG-OM2, VG-RPD/RPL;  B. 05/2011 - NSTEMI - CATH WITH 4/5 PATENT GRAFTS AND NEW THROMBUS IN DISTAL VG-OM2 - MED RX  . DIVERTICULOSIS, COLON 09/19/2008  . Erectile dysfunction 09/13/2012  . GASTROINTESTINAL HEMORRHAGE, HX OF 04/19/2008  . GERD 09/19/2008  . GOUT 09/19/2008  . HYPERLIPIDEMIA 09/19/2008   no meds - diet controlled  . HYPERTENSION 09/19/2008   patient denies this dx - no meds for HTN  . Hypothyroidism 12/05/2008  . LOW BACK PAIN 09/19/2008  . Myocardial infarction ([place]) 2013, 2017   both minor - left anterior vessel  . NEPHROLITHIASIS, HX OF 01/11/2009  . PERIPHERAL VASCULAR DISEASE 09/19/2008  . PSA, INCREASED 09/19/2008  . RENAL INSUFFICIENCY 09/19/2008  . Unstable angina pectoris ([place]) 05/2015   Cath 02/02 w/ patent LIMA-LAD, SVG-D1, SVG-RCA, severe dz in SVG-OM, med rx    Patient Active Problem List   Diagnosis Date Noted  . Acute on chronic renal failure ([place]) 12/29/2016  . Chest pain with moderate risk for cardiac etiology 12/19/2016  . Hyperglycemia 12/16/2016  . Chest pain 07/27/2016  . Atypical chest pain   . Cough 06/29/2015  . Unstable angina ([place]) 06/19/2015  . Abdominal  mass, RUQ (right upper quadrant) 04/22/2015  . Benign paroxysmal positional vertigo 02/25/2015  . Former smoker 06/30/2014  . Angiodysplasia of small intestine, except duodenum with bleeding 02/13/2014  . Acute blood loss anemia 02/12/2014  . Skin lesion of face 12/31/2013  . Enlarged salivary gland 12/31/2013  . Right shoulder pain 06/27/2013  . Rash and nonspecific skin eruption 06/27/2013  . Erectile dysfunction 09/13/2012  . GI AVM (gastrointestinal arteriovenous vascular malformation) 04/28/2012  . Stricture and stenosis of esophagus 04/28/2012   . GI bleed 04/26/2012  . Non-ST elevation (NSTEMI) myocardial infarction ([place]) 05/24/2011  . COPD GOLD 0  09/08/2010  . Hypothyroid 08/21/2010  . Preventative health care 08/21/2010  . GASTRITIS 07/21/2010  . Anxiety state 06/13/2010  . NEPHROLITHIASIS, HX OF 01/11/2009  . Coronary atherosclerosis, CABG 2004 12/05/2008  . ATRIAL FLUTTER, CHRONIC 12/05/2008  . Sinus node dysfunction ([place]) 12/05/2008  . Hyperlipidemia LDL goal <70 09/19/2008  . GOUT 09/19/2008  . Iron deficiency anemia 09/19/2008  . Essential hypertension 09/19/2008  . Aneurysm of abdominal vessel ([place]) 09/19/2008  . PERIPHERAL VASCULAR DISEASE 09/19/2008  . GERD 09/19/2008  . Diverticulitis 09/19/2008  . CKD (chronic kidney disease), stage III 09/19/2008  . LOW BACK PAIN 09/19/2008  . PSA, INCREASED 09/19/2008  . COLONIC POLYPS, HX OF 09/19/2008  . Atrial fibrillation ([place]) 04/19/2008  . GASTROINTESTINAL HEMORRHAGE, HX OF 04/19/2008    Past Surgical History:  Procedure Laterality Date  . CARDIAC CATHETERIZATION  05/25/2011  . CARDIAC CATHETERIZATION N/A 06/20/2015   Procedure: Coronary/Graft Angiography;  Surgeon: Peter M Martinique, MD; LAD, CFX & RCA 100%; LIMA-LAD, SVG-D1 & SVG-RCA OK; SVG-OM severe dz, med rx   . COLONOSCOPY     several - polyps  . CORONARY ARTERY BYPASS GRAFT  2004 X 5  . ENTEROSCOPY N/A 02/13/2014   Procedure: ENTEROSCOPY;  Surgeon: [name], MD;  Location: [facility] ENDOSCOPY;  Service: Gastroenterology;  Laterality: N/A;  super slim  . ESOPHAGOGASTRODUODENOSCOPY  04/28/2012   Procedure: ESOPHAGOGASTRODUODENOSCOPY (EGD);  Surgeon: [name], MD;  Location: [facility];  Service: Endoscopy;  Laterality: N/A;  . KNEE SURGERY Left   . LEFT HEART CATHETERIZATION WITH CORONARY/GRAFT ANGIOGRAM  05/25/2011   Procedure: LEFT HEART CATHETERIZATION WITH [name];  Surgeon: [name], MD;  Location: [facility] CATH LAB;  Service: Cardiovascular;;  . MULTIPLE TOOTH EXTRACTIONS    . s/p  afib ablation  2004       Home Medications    Prior to Admission medications   Medication Sig Start Date End Date Taking? Authorizing Provider  allopurinol (ZYLOPRIM) 300 MG tablet Take 1 tablet (300 mg total) by mouth daily. Patient taking differently: Take 300 mg by mouth at bedtime.  06/18/16  Yes Biagio Borg, MD  amiodarone (PACERONE) 200 MG tablet Take 0.5 tablets (100 mg total) by mouth daily. Patient taking differently: Take 100 mg by mouth at bedtime.  08/12/16  Yes Evans Lance, MD  aspirin 81 MG tablet Take 162 mg by mouth at bedtime.    Yes [provider]  b complex vitamins tablet Take 1 tablet by mouth at bedtime.    Yes [provider]  Cholecalciferol (VITAMIN D) 2000 UNITS CAPS Take 2,000 Units by mouth at bedtime.    Yes [provider]  EPINEPHrine (EPIPEN) 0.3 mg/0.3 mL SOAJ injection Inject 0.3 mLs (0.3 mg total) into the muscle once. Patient taking differently: Inject 0.3 mg into the muscle once as needed (severe allergic reaction).  02/01/13  Yes Copland, Gay Filler, MD  hydrALAZINE (APRESOLINE) 50 MG tablet Take 1 tablet (50 mg total) by mouth 2 (two) times daily. Patient taking differently: Take 75 mg by mouth 2 (two) times daily.  12/24/16  Yes Camnitz, Will Hassell Done, MD  isosorbide mononitrate (IMDUR) 30 MG 24 hr tablet Take 0.5 tablets (15 mg total) by mouth daily. 12/22/16  Yes Bhagat, Bhavinkumar, PA  levothyroxine (SYNTHROID, LEVOTHROID) 125 MCG tablet Take 1 tablet (125 mcg total) by mouth daily. 06/18/16  Yes Biagio Borg, MD  Magnesium 250 MG TABS Take 250 mg by mouth at bedtime.   Yes [provider]  Multiple Vitamin (MULTIVITAMIN WITH MINERALS) TABS tablet Take 1 tablet by mouth at bedtime.   Yes [provider]  nitroGLYCERIN (NITROSTAT) 0.4 MG SL tablet PLACE 1 TABLET UNDER THE TONGUE EVERY 5 MINUTES AS NEEDED FOR CHEST PAIN FOR UP TO 3 DOSES. 09/05/15  Yes Biagio Borg, MD  pantoprazole (PROTONIX) 40 MG tablet  Take 1 tablet (40 mg total) by mouth daily. Yearly physical due in February must see MD for refills Patient taking differently: Take 40 mg by mouth at bedtime. Yearly physical due in February must see MD for refills 06/18/16  Yes Biagio Borg, MD  tiotropium (SPIRIVA HANDIHALER) 18 MCG inhalation capsule PLACE 1 CAPSULE (18 MCG TOTAL) INTO INHALER AND INHALE DAILY. Patient taking differently: Place 18 mcg into inhaler and inhale daily as needed (shortness of breath).  06/18/16  Yes Biagio Borg, MD  vitamin C (ASCORBIC ACID) 500 MG tablet Take 500 mg by mouth at bedtime.    Yes [provider]  acetaminophen (TYLENOL) 325 MG tablet Take 2 tablets (650 mg total) by mouth every 6 (six) hours as needed for moderate pain. Patient not taking: Reported on 12/19/2016 07/28/16   Thurnell Lose, MD  albuterol (PROVENTIL HFA;VENTOLIN HFA) 108 (90 Base) MCG/ACT inhaler Inhale 2 puffs into the lungs every 6 (six) hours as needed for wheezing or shortness of breath. Reported on 08/09/2015 Patient not taking: Reported on 12/19/2016 06/18/16   Biagio Borg, MD  neomycin-bacitracin-polymyxin (NEOSPORIN) OINT Apply 1 application topically daily as needed for wound care.    [provider]  vardenafil (LEVITRA) 20 MG tablet Take 1 tablet (20 mg total) by mouth as needed for erectile dysfunction. Patient not taking: Reported on 12/29/2016 06/18/16 06/18/17  Biagio Borg, MD    Family History Family History  Problem Relation Age of Onset  . Multiple myeloma Mother   . Cancer Mother   . Other Mother        varicose veins  . Diabetes Father   . Diabetes Sister   . Heart disease Paternal Uncle   . Heart disease Maternal Uncle   . Lymphoma Sister        half-sister  . Heart attack Maternal Uncle   . Heart attack Paternal Grandmother   . Heart attack Paternal Grandfather   . Hypertension Neg Hx   . Stroke Neg Hx   . Colon cancer Neg Hx   . Colon polyps Neg Hx   . Rectal cancer Neg Hx   . Stomach  cancer Neg Hx     Social History Social History  Substance Use Topics  . Smoking status: Former Smoker    Packs/day: 4.00    Years: 31.00    Types: Cigarettes    Quit date: 03/19/1983  . Smokeless tobacco: Never Used  . Alcohol use 12.6 - 16.8 oz/week  21 - 28 Shots of liquor per week     Allergies   Fish allergy; Promethazine hcl; Shellfish allergy; Yellow jacket venom [bee venom]; Doxycycline hyclate; Lipitor [atorvastatin calcium]; Tetracycline; Crestor [rosuvastatin]; and Tetanus toxoid   Review of Systems Review of Systems  Constitutional: Negative for chills and fever.  HENT: Negative for ear pain and sore throat.   Eyes: Negative for pain and visual disturbance.  Respiratory: Negative for cough and shortness of breath.   Cardiovascular: Negative for chest pain and palpitations.  Gastrointestinal: Negative for abdominal pain and vomiting.  Genitourinary: Negative for dysuria and hematuria.  Musculoskeletal: Negative for arthralgias and back pain.  Skin: Negative for color change and rash.  Neurological: Positive for headaches. Negative for seizures and syncope.  All other systems reviewed and are negative.    Physical Exam Updated Vital Signs BP 135/71   Pulse (!) 52   Temp 98.2 F (36.8 C) (Oral)   Resp 20   Ht '5\' 7"'$  (1.702 m)   Wt 81.2 kg (179 lb)   SpO2 95%   BMI 28.04 kg/m   Physical Exam  Constitutional: He appears well-developed and well-nourished.  HENT:  Head: Normocephalic and atraumatic.  Eyes: Conjunctivae are normal.  Neck: Neck supple.  Cardiovascular: Normal rate and regular rhythm.   No murmur heard. Pulmonary/Chest: Effort normal and breath sounds normal. No respiratory distress.  Abdominal: Soft. There is no tenderness.  Musculoskeletal: He exhibits no edema.  Neurological: He is alert.  Skin: Skin is warm and dry.  Psychiatric: He has a normal mood and affect.  Nursing note and vitals reviewed.    ED Treatments / Results    Labs (all labs ordered are listed, but only abnormal results are displayed) Labs Reviewed  BASIC METABOLIC PANEL - Abnormal; Notable for the following:       Result Value   CO2 20 (*)    Glucose, Bld 115 (*)    BUN 35 (*)    Creatinine, Ser 2.11 (*)    GFR calc non Af Amer 28 (*)    GFR calc Af Amer 32 (*)    All other components within normal limits  CBC - Abnormal; Notable for the following:    RBC 4.17 (*)    HCT 38.7 (*)    All other components within normal limits  I-STAT TROPONIN, ED  I-STAT TROPONIN, ED    EKG  EKG Interpretation  Date/Time:  Tuesday December 29 2016 16:00:36 EDT Ventricular Rate:  73 PR Interval:  180 QRS Duration: 138 QT Interval:  438 QTC Calculation: 482 R Axis:   -132 Text Interpretation:  Normal sinus rhythm Right bundle branch block Abnormal ECG When compared to prior, no significant changes.  No STEMI Confirmed by Antony Blackbird [phone]) on 12/29/2016 6:13:22 PM       Radiology Dg Chest 2 View  Result Date: 12/29/2016 CLINICAL DATA:  Chest pain. EXAM: CHEST  2 VIEW COMPARISON:  Chest x-ray dated December 19, 2016. FINDINGS: Prior CABG. The cardiomediastinal silhouette is normal in size. Normal pulmonary vascularity. Atherosclerotic calcification of the aortic arch. Bibasilar atelectasis. No focal consolidation, pleural effusion, or pneumothorax. No acute osseous abnormality. IMPRESSION: No active cardiopulmonary disease. Electronically Signed   By: Titus Dubin M.D.   On: 12/29/2016 16:33    Procedures Procedures (including critical care time)  Medications Ordered in ED Medications  nitroGLYCERIN (NITROSTAT) SL tablet 0.4 mg (0.4 mg Sublingual Given 12/29/16 2002)  0.9 %  sodium chloride infusion ( Intravenous New  Bag/Given 12/29/16 2002)     Initial Impression / Assessment and Plan / ED Course  I have reviewed the triage vital signs and the nursing notes.  Pertinent labs & imaging results that were available during my care of the  patient were reviewed by me and considered in my medical decision making (see chart for details).    Patient with extensive cardiac history and recent hospitalization for NSTEMI presenting with chest pain relieved by nitroglycerine.  - Troponin: 0.02 - CBC: WNL - BMP: BUN 35, Cr 2.11 - CXR: No active cardiopulmonary disease - EKG: NSR, RBBB, No signifiant change from previous  Will consult cardiology considering cardiac history and recent admission for NSTEMI deemed a poor candidate for intervention. - Repeat troponin: 0.05  Cardiology to [place].  Final Clinical Impressions(s) / ED Diagnoses   Final diagnoses:  Unstable angina [facility])    New Prescriptions New Prescriptions   No medications on file     Neva Seat, MD 12/29/16 2052    Tegeler, Gwenyth Allegra, MD 12/30/16 2020

## 2016-12-29 NOTE — ED Triage Notes (Signed)
Per Pt, Pt is coming from home with complaints of mid-center chest pain that started about thirty minutes ago. Pt reports taking a Nitro with some relief. Some SOB and Back pain noted, but denies any dizziness, N/V

## 2016-12-29 NOTE — ED Notes (Signed)
Pt asking for pain meds, asked Dr. Gustavus Messing who will see patient first. Notified pt of plan.

## 2016-12-29 NOTE — Progress Notes (Signed)
ANTICOAGULATION CONSULT NOTE - Initial Consult  Pharmacy Consult for heparin  Indication: chest pain/ACS  Allergies  Allergen Reactions  . Fish Allergy Diarrhea and Nausea And Vomiting  . Promethazine Hcl Other (See Comments)    Syncope (reaction to phenergan)  . Shellfish Allergy Diarrhea and Nausea And Vomiting  . Yellow Jacket Venom [Bee Venom] Swelling    Localized swelling from yellow jackets and honey bees  . Doxycycline Hyclate Other (See Comments)    esophagitis  . Lipitor [Atorvastatin Calcium] Other (See Comments)    Myalgia/myopathy  . Tetracycline Other (See Comments)    Esophagitis  . Crestor [Rosuvastatin] Other (See Comments)    Myalgia/myopathy  . Tetanus Toxoid Swelling and Rash    Patient Measurements: Height: 5\' 7"  (170.2 cm) Weight: 179 lb (81.2 kg) IBW/kg (Calculated) : 66.1 Heparin Dosing Weight: 81 kg   Vital Signs: Temp: 98.2 F (36.8 C) (08/14 1606) Temp Source: Oral (08/14 1606) BP: 135/71 (08/14 2000) Pulse Rate: 52 (08/14 2000)  Labs:  Recent Labs  12/29/16 1606  HGB 13.1  HCT 38.7*  PLT 217  CREATININE 2.11*    Estimated Creatinine Clearance: 28.5 mL/min (A) (by C-G formula based on SCr of 2.11 mg/dL (H)).   Medical History: Past Medical History:  Diagnosis Date  . AAA 09/19/2008  . Allergy   . ANEMIA-IRON DEFICIENCY 09/19/2008  . ANXIETY 06/13/2010  . Arthritis    no meds - knee/back  . Atrial fibrillation ([place]) 04/19/2008   amiodarone rx;  Echocardiogram 05/26/11: No wall motion abnormalities, mild LVH, EF 60%.  . Atrial flutter ([place]) 12/05/2008   s/p RFCA - ablation  . Benign esophageal stricture 09/2075   dilated during EGD  . Blood transfusion without reported diagnosis 2013   [facility] - 2 units transfused  . BRADYCARDIA 12/05/2008  . CAD 12/05/2008   s/p CABG; NSTEMI 05/2011 - LHC 05/25/11: LAD occluded, LIMA-LAD patent, ostial circumflex 50%, AV circumflex 70%, SVG-OM2 with extensive disease with thrombus (culprit vessel), RCA  80% and occluded, SVG-intermediate patent, SVG-PDA/PL A patent.  Given that the culprit vessel was a subtotally occluded heavily thrombotic graft to a smaller OM2, medical therapy was recommended.;   . Cataract    left - small - no treatment yet  . CKD (chronic kidney disease) 09/19/2008   Qualifier: Diagnosis of  By: Jenny Reichmann MD, Hunt Oris  -- Stage 3  . COLONIC POLYPS, HX OF 09/19/2008   adenomatous polyps 07/2010  . COPD (chronic obstructive pulmonary disease) ([place])   . CORONARY ARTERY BYPASS GRAFT, HX OF 12/05/2008   A. LIMA-LAD, VG-RI, VG-OM2, VG-RPD/RPL;  B. 05/2011 - NSTEMI - CATH WITH 4/5 PATENT GRAFTS AND NEW THROMBUS IN DISTAL VG-OM2 - MED RX  . DIVERTICULOSIS, COLON 09/19/2008  . Erectile dysfunction 09/13/2012  . GASTROINTESTINAL HEMORRHAGE, HX OF 04/19/2008  . GERD 09/19/2008  . GOUT 09/19/2008  . HYPERLIPIDEMIA 09/19/2008   no meds - diet controlled  . HYPERTENSION 09/19/2008   patient denies this dx - no meds for HTN  . Hypothyroidism 12/05/2008  . LOW BACK PAIN 09/19/2008  . Myocardial infarction ([place]) 2013, 2017   both minor - left anterior vessel  . NEPHROLITHIASIS, HX OF 01/11/2009  . PERIPHERAL VASCULAR DISEASE 09/19/2008  . PSA, INCREASED 09/19/2008  . RENAL INSUFFICIENCY 09/19/2008  . Unstable angina pectoris ([place]) 05/2015   Cath 02/02 w/ patent LIMA-LAD, SVG-D1, SVG-RCA, severe dz in SVG-OM, med rx    Assessment: 12 yom presented to the hospital with CP. To start IV  heparin. Baseline CBC is WNL. He is not on anticoagulation PTA. Planning no boluses due to history of GIB.   Goal of Therapy:  Heparin level 0.3-0.7 units/ml Monitor platelets by anticoagulation protocol: Yes   Plan:  Heparin gtt 1100 units/hr - NO BOLUS Check an 8 hr heparin level Daily heparin level and CBC  Vincenza Hews, PharmD, BCPS 12/29/2016, 9:26 PM

## 2016-12-29 NOTE — H&P (Signed)
ADMISSION HISTORY & PHYSICAL  Patient Name: Omar Benson Date of Encounter: 12/29/2016 Primary Care Physician: Biagio Borg, MD Cardiologist: [facility] Problem List   Principal Problem:   Unstable angina [facility]) Active Problems:   Hyperlipidemia LDL goal <70   Coronary atherosclerosis, CABG 2004   ATRIAL FLUTTER, CHRONIC   CKD (chronic kidney disease), stage III   Acute on chronic renal failure [facility])    Chief Complaint    Chest pain  HPI  This is a 82 y.o. male patient of Dr. Cristopher Peru with a past medical history significant for coronary artery disease status post CABG 4 and PCI, atrial flutter status post catheter ablation, chronic atrial flutter, hypertension, COPD, anxiety and recurrent angina as well as stage III chronic kidney disease. He was recently admitted on 12/19/2016 for chest pain and found to have non-ST elevation MI. Peak troponin was 0.98. Echo was performed which showed normal LV EF of 55-60% with no wall motion abnormalities. He was started on isosorbide and had refused hydralazine for intermittently elevated blood pressures. His last heart catheterization was in February 2017 which showed severe three-vessel native occlusive disease, a patent LIMA to LAD, patent SVG to first diagonal, patent SVG RCA and severely diseased SVG to the OM (stable compared to a cath in 2013-not amenable to PCI). He was also noted to be bradycardic and it was thought that he may eventually need a pacemaker. He's been statin intolerant and has a persistently elevated LDL-C of 99 with triglycerides in the 200s and may be a candidate for PCSK9 inhibitor.  He now presents with acute chest pain\ which occurred while putting up drywall today. This is described as substernal in the central chest, nonradiating and similar to prior anginal pain. He says he may have missed 1 or 2 doses of his isosorbide but reports she's having similar chest pain that he presented with previously. He reports  some relief with nitroglycerin. EKG shows a right bundle branch block, however normal sinus rhythm at 73. No ischemic changes are noted. Initial troponin was 0.02. Creatinine is elevated at 2.11, which is up from 1.79 at recent discharge. Otherwise labs are stable.  PMHx   Past Medical History:  Diagnosis Date  . AAA 09/19/2008  . Allergy   . ANEMIA-IRON DEFICIENCY 09/19/2008  . ANXIETY 06/13/2010  . Arthritis    no meds - knee/back  . Atrial fibrillation ([place]) 04/19/2008   amiodarone rx;  Echocardiogram 05/26/11: No wall motion abnormalities, mild LVH, EF 60%.  . Atrial flutter ([place]) 12/05/2008   s/p RFCA - ablation  . Benign esophageal stricture 09/2075   dilated during EGD  . Blood transfusion without reported diagnosis 2013   [facility] - 2 units transfused  . BRADYCARDIA 12/05/2008  . CAD 12/05/2008   s/p CABG; NSTEMI 05/2011 - LHC 05/25/11: LAD occluded, LIMA-LAD patent, ostial circumflex 50%, AV circumflex 70%, SVG-OM2 with extensive disease with thrombus (culprit vessel), RCA 80% and occluded, SVG-intermediate patent, SVG-PDA/PL A patent.  Given that the culprit vessel was a subtotally occluded heavily thrombotic graft to a smaller OM2, medical therapy was recommended.;   . Cataract    left - small - no treatment yet  . CKD (chronic kidney disease) 09/19/2008   Qualifier: Diagnosis of  By: Jenny Reichmann MD, Hunt Oris  -- Stage 3  . COLONIC POLYPS, HX OF 09/19/2008   adenomatous polyps 07/2010  . COPD (chronic obstructive pulmonary disease) ([place])   . CORONARY ARTERY BYPASS GRAFT, HX  OF 12/05/2008   A. LIMA-LAD, VG-RI, VG-OM2, VG-RPD/RPL;  B. 05/2011 - NSTEMI - CATH WITH 4/5 PATENT GRAFTS AND NEW THROMBUS IN DISTAL VG-OM2 - MED RX  . DIVERTICULOSIS, COLON 09/19/2008  . Erectile dysfunction 09/13/2012  . GASTROINTESTINAL HEMORRHAGE, HX OF 04/19/2008  . GERD 09/19/2008  . GOUT 09/19/2008  . HYPERLIPIDEMIA 09/19/2008   no meds - diet controlled  . HYPERTENSION 09/19/2008   patient denies this dx - no meds for HTN  .  Hypothyroidism 12/05/2008  . LOW BACK PAIN 09/19/2008  . Myocardial infarction ([place]) 2013, 2017   both minor - left anterior vessel  . NEPHROLITHIASIS, HX OF 01/11/2009  . PERIPHERAL VASCULAR DISEASE 09/19/2008  . PSA, INCREASED 09/19/2008  . RENAL INSUFFICIENCY 09/19/2008  . Unstable angina pectoris ([place]) 05/2015   Cath 02/02 w/ patent LIMA-LAD, SVG-D1, SVG-RCA, severe dz in SVG-OM, med rx    Past Surgical History:  Procedure Laterality Date  . CARDIAC CATHETERIZATION  05/25/2011  . CARDIAC CATHETERIZATION N/A 06/20/2015   Procedure: Coronary/Graft Angiography;  Surgeon: Peter M Martinique, MD; LAD, CFX & RCA 100%; LIMA-LAD, SVG-D1 & SVG-RCA OK; SVG-OM severe dz, med rx   . COLONOSCOPY     several - polyps  . CORONARY ARTERY BYPASS GRAFT  2004 X 5  . ENTEROSCOPY N/A 02/13/2014   Procedure: ENTEROSCOPY;  Surgeon: [name], MD;  Location: [facility] ENDOSCOPY;  Service: Gastroenterology;  Laterality: N/A;  super slim  . ESOPHAGOGASTRODUODENOSCOPY  04/28/2012   Procedure: ESOPHAGOGASTRODUODENOSCOPY (EGD);  Surgeon: [name], MD;  Location: [place];  Service: Endoscopy;  Laterality: N/A;  . KNEE SURGERY Left   . LEFT HEART CATHETERIZATION WITH CORONARY/GRAFT ANGIOGRAM  05/25/2011   Procedure: LEFT HEART CATHETERIZATION WITH [name];  Surgeon: [name], MD;  Location: [facility] CATH LAB;  Service: Cardiovascular;;  . MULTIPLE TOOTH EXTRACTIONS    . s/p afib ablation  2004    FAMHx   Family History  Problem Relation Age of Onset  . Multiple myeloma Mother   . Cancer Mother   . Other Mother        varicose veins  . Diabetes Father   . Diabetes Sister   . Heart disease Paternal Uncle   . Heart disease Maternal Uncle   . Lymphoma Sister        half-sister  . Heart attack Maternal Uncle   . Heart attack Paternal Grandmother   . Heart attack Paternal Grandfather   . Hypertension Neg Hx   . Stroke Neg Hx   . Colon cancer Neg Hx   . Colon polyps Neg Hx   . Rectal cancer  Neg Hx   . Stomach cancer Neg Hx     SOCHx    reports that he quit smoking about 33 years ago. His smoking use included Cigarettes. He has a 124.00 pack-year smoking history. He has never used smokeless tobacco. He reports that he drinks about 12.6 - 16.8 oz of alcohol per week . He reports that he does not use drugs.  Outpatient Medications   No current facility-administered medications on file prior to encounter.    Current Outpatient Prescriptions on File Prior to Encounter  Medication Sig Dispense Refill  . allopurinol (ZYLOPRIM) 300 MG tablet Take 1 tablet (300 mg total) by mouth daily. (Patient taking differently: Take 300 mg by mouth at bedtime. ) 90 tablet 3  . amiodarone (PACERONE) 200 MG tablet Take 0.5 tablets (100 mg total) by mouth daily. (Patient taking differently: Take 100  mg by mouth at bedtime. ) 45 tablet 3  . aspirin 81 MG tablet Take 162 mg by mouth at bedtime.     Marland Kitchen b complex vitamins tablet Take 1 tablet by mouth at bedtime.     . Cholecalciferol (VITAMIN D) 2000 UNITS CAPS Take 2,000 Units by mouth at bedtime.     Marland Kitchen EPINEPHrine (EPIPEN) 0.3 mg/0.3 mL SOAJ injection Inject 0.3 mLs (0.3 mg total) into the muscle once. (Patient taking differently: Inject 0.3 mg into the muscle once as needed (severe allergic reaction). ) 2 Device 2  . hydrALAZINE (APRESOLINE) 50 MG tablet Take 1 tablet (50 mg total) by mouth 2 (two) times daily. (Patient taking differently: Take 75 mg by mouth 2 (two) times daily. )    . isosorbide mononitrate (IMDUR) 30 MG 24 hr tablet Take 0.5 tablets (15 mg total) by mouth daily. 30 tablet 6  . levothyroxine (SYNTHROID, LEVOTHROID) 125 MCG tablet Take 1 tablet (125 mcg total) by mouth daily. 90 tablet 3  . Magnesium 250 MG TABS Take 250 mg by mouth at bedtime.    . Multiple Vitamin (MULTIVITAMIN WITH MINERALS) TABS tablet Take 1 tablet by mouth at bedtime.    . nitroGLYCERIN (NITROSTAT) 0.4 MG SL tablet PLACE 1 TABLET UNDER THE TONGUE EVERY 5 MINUTES  AS NEEDED FOR CHEST PAIN FOR UP TO 3 DOSES. 25 tablet 5  . pantoprazole (PROTONIX) 40 MG tablet Take 1 tablet (40 mg total) by mouth daily. Yearly physical due in February must see MD for refills (Patient taking differently: Take 40 mg by mouth at bedtime. Yearly physical due in February must see MD for refills) 90 tablet 3  . tiotropium (SPIRIVA HANDIHALER) 18 MCG inhalation capsule PLACE 1 CAPSULE (18 MCG TOTAL) INTO INHALER AND INHALE DAILY. (Patient taking differently: Place 18 mcg into inhaler and inhale daily as needed (shortness of breath). ) 90 capsule 3  . vitamin C (ASCORBIC ACID) 500 MG tablet Take 500 mg by mouth at bedtime.     Marland Kitchen acetaminophen (TYLENOL) 325 MG tablet Take 2 tablets (650 mg total) by mouth every 6 (six) hours as needed for moderate pain. (Patient not taking: Reported on 12/19/2016) 20 tablet 0  . albuterol (PROVENTIL HFA;VENTOLIN HFA) 108 (90 Base) MCG/ACT inhaler Inhale 2 puffs into the lungs every 6 (six) hours as needed for wheezing or shortness of breath. Reported on 08/09/2015 (Patient not taking: Reported on 12/19/2016) 3 Inhaler 3  . neomycin-bacitracin-polymyxin (NEOSPORIN) OINT Apply 1 application topically daily as needed for wound care.    . vardenafil (LEVITRA) 20 MG tablet Take 1 tablet (20 mg total) by mouth as needed for erectile dysfunction. (Patient not taking: Reported on 12/29/2016) 5 tablet 11    Inpatient Medications    Scheduled Meds:   Continuous Infusions:   PRN Meds:    ALLERGIES   Allergies  Allergen Reactions  . Fish Allergy Diarrhea and Nausea And Vomiting  . Promethazine Hcl Other (See Comments)    Syncope (reaction to phenergan)  . Shellfish Allergy Diarrhea and Nausea And Vomiting  . Yellow Jacket Venom [Bee Venom] Swelling    Localized swelling from yellow jackets and honey bees  . Doxycycline Hyclate Other (See Comments)    esophagitis  . Lipitor [Atorvastatin Calcium] Other (See Comments)    Myalgia/myopathy  . Tetracycline  Other (See Comments)    Esophagitis  . Crestor [Rosuvastatin] Other (See Comments)    Myalgia/myopathy  . Tetanus Toxoid Swelling and Rash    ROS  Pertinent items noted in HPI and remainder of comprehensive ROS otherwise negative.  Vitals   Vitals:   12/29/16 1603 12/29/16 1606 12/29/16 1804  BP:  118/71 138/77  Pulse:  72 (!) 55  Resp:  16 16  Temp:  98.2 F (36.8 C)   TempSrc:  Oral   SpO2:  94% 94%  Weight: 179 lb (81.2 kg)    Height: '5\' 7"'$  (1.702 m)     No intake or output data in the 24 hours ending 12/29/16 1912 Filed Weights   12/29/16 1603  Weight: 179 lb (81.2 kg)    Physical Exam   General appearance: alert and no distress Neck: no carotid bruit, no JVD and thyroid not enlarged, symmetric, no tenderness/mass/nodules Lungs: clear to auscultation bilaterally Heart: regular bradycardia Abdomen: soft, non-tender; bowel sounds normal; no masses,  no organomegaly Extremities: extremities normal, atraumatic, no cyanosis or edema Pulses: 2+ and symmetric Skin: Skin color, texture, turgor normal. No rashes or lesions Neurologic: Grossly normal Psych: Pleasant  Labs   Results for orders placed or performed during the hospital encounter of 12/29/16 (from the past 48 hour(s))  Basic metabolic panel     Status: Abnormal   Collection Time: 12/29/16  4:06 PM  Result Value Ref Range   Sodium 137 135 - 145 mmol/L   Potassium 4.6 3.5 - 5.1 mmol/L   Chloride 108 101 - 111 mmol/L   CO2 20 (L) 22 - 32 mmol/L   Glucose, Bld 115 (H) 65 - 99 mg/dL   BUN 35 (H) 6 - 20 mg/dL   Creatinine, Ser 2.11 (H) 0.61 - 1.24 mg/dL   Calcium 9.4 8.9 - 10.3 mg/dL   GFR calc non Af Amer 28 (L) >60 mL/min   GFR calc Af Amer 32 (L) >60 mL/min    Comment: (NOTE) The eGFR has been calculated using the CKD EPI equation. This calculation has not been validated in all clinical situations. eGFR's persistently <60 mL/min signify possible Chronic Kidney Disease.    Anion gap 9 5 - 15  CBC      Status: Abnormal   Collection Time: 12/29/16  4:06 PM  Result Value Ref Range   WBC 6.8 4.0 - 10.5 K/uL   RBC 4.17 (L) 4.22 - 5.81 MIL/uL   Hemoglobin 13.1 13.0 - 17.0 g/dL   HCT 38.7 (L) 39.0 - 52.0 %   MCV 92.8 78.0 - 100.0 fL   MCH 31.4 26.0 - 34.0 pg   MCHC 33.9 30.0 - 36.0 g/dL   RDW 14.1 11.5 - 15.5 %   Platelets 217 150 - 400 K/uL  I-stat troponin, ED     Status: None   Collection Time: 12/29/16  4:16 PM  Result Value Ref Range   Troponin i, poc 0.02 0.00 - 0.08 ng/mL   Comment 3            Comment: Due to the release kinetics of cTnI, a negative result within the first hours of the onset of symptoms does not rule out myocardial infarction with certainty. If myocardial infarction is still suspected, repeat the test at appropriate intervals.     ECG   Normal sinus rhythm at 79, RBBB - Personally Reviewed  Telemetry   Sinus rhythm - Personally Reviewed  Radiology   Dg Chest 2 View  Result Date: 12/29/2016 CLINICAL DATA:  Chest pain. EXAM: CHEST  2 VIEW COMPARISON:  Chest x-ray dated December 19, 2016. FINDINGS: Prior CABG. The cardiomediastinal silhouette is normal in size.  Normal pulmonary vascularity. Atherosclerotic calcification of the aortic arch. Bibasilar atelectasis. No focal consolidation, pleural effusion, or pneumothorax. No acute osseous abnormality. IMPRESSION: No active cardiopulmonary disease. Electronically Signed   By: Titus Dubin M.D.   On: 12/29/2016 16:33    Cardiac Studies   LV EF: 55% -   60%  ------------------------------------------------------------------- Indications:      Chest pain 786.51.  ------------------------------------------------------------------- History:   PMH:  Chronic Kidney Disease.  Atrial fibrillation. Atrial flutter.  Coronary artery disease.  Chronic obstructive pulmonary disease.  PMH:   Myocardial infarction.  Risk factors: Hypertension.  Dyslipidemia.  ------------------------------------------------------------------- Study Conclusions  - Left ventricle: The cavity size was normal. There was mild   concentric hypertrophy. Systolic function was normal. The   estimated ejection fraction was in the range of 55% to 60%. Wall   motion was normal; there were no regional wall motion   abnormalities. Doppler parameters are consistent with abnormal   left ventricular relaxation (grade 1 diastolic dysfunction). - Mitral valve: Calcified annulus. - Left atrium: The atrium was mildly dilated. - Atrial septum: No defect or patent foramen ovale was identified.  Assessment   Principal Problem:   Unstable angina (HCC) Active Problems:   Hyperlipidemia LDL goal <70   Coronary atherosclerosis, CABG 2004   ATRIAL FLUTTER, CHRONIC   CKD (chronic kidney disease), stage III   Acute on chronic renal failure ([place])   Plan   1. Omar Benson presents with acute chest pain which is concerning for unstable angina. This is similar to his symptoms most recently which was improved somewhat with medical therapy although he did have non-ST elevation MI. Initial troponin is negative however will plan to admit him and trend troponins. I recommend heparinization. I suspect he may be losing a bypass graft. Unfortunately, he is now an acute on chronic renal failure with a GFR in the 20s. Cardiac catheterization would be very risky at this point with regards to him possibly needing short-term or long-term dialysis. Given normal LV function on his last echo, will try some gentle hydration to see if that improves his creatinine. We'll continue his home antianginal medications. Ultimately if his creatinine returns to baseline and we cannot get him chest pain-free, he may need a repeat cardiac catheterization.  Time Spent Directly with Patient:  I have spent a total of 35 minutes with patient reviewing hospital notes, telemetry, EKGs, labs and examining the  patient as well as establishing an assessment and plan that was discussed with the patient. > 50% of time was spent in direct patient care.   Length of Stay:  LOS: 0 days   Pixie Casino, MD, [place]  Attending Cardiologist  Direct Dial: [phone]  Fax: [phone]  Website: [email or web address] 12/29/2016, 7:12 PM

## 2016-12-29 NOTE — Progress Notes (Signed)
Troponin 0.06, patient denies chest pain, endorses a little back pain, MD notified.

## 2016-12-30 ENCOUNTER — Encounter (HOSPITAL_COMMUNITY)
Admission: EM | Disposition: A | Discharge status: Home / Self Care | Payer: Self-pay | Source: Home / Self Care | Attending: Internal Medicine

## 2016-12-30 ENCOUNTER — Other Ambulatory Visit: Payer: Self-pay

## 2016-12-30 ENCOUNTER — Telehealth (HOSPITAL_COMMUNITY): Payer: Self-pay

## 2016-12-30 DIAGNOSIS — I4892 Unspecified atrial flutter: Secondary | ICD-10-CM

## 2016-12-30 DIAGNOSIS — I214 Non-ST elevation (NSTEMI) myocardial infarction: Secondary | ICD-10-CM

## 2016-12-30 DIAGNOSIS — I2510 Atherosclerotic heart disease of native coronary artery without angina pectoris: Secondary | ICD-10-CM

## 2016-12-30 HISTORY — PX: LEFT HEART CATH AND CORS/GRAFTS ANGIOGRAPHY: CATH118250

## 2016-12-30 LAB — BASIC METABOLIC PANEL
ANION GAP: 8 (ref 5–15)
BUN: 28 mg/dL — ABNORMAL HIGH (ref 6–20)
CHLORIDE: 110 mmol/L (ref 101–111)
CO2: 22 mmol/L (ref 22–32)
Calcium: 9.3 mg/dL (ref 8.9–10.3)
Creatinine, Ser: 1.84 mg/dL — ABNORMAL HIGH (ref 0.61–1.24)
GFR calc non Af Amer: 33 mL/min — ABNORMAL LOW (ref >60)
GFR, EST AFRICAN AMERICAN: 38 mL/min — AB (ref >60)
Glucose, Bld: 106 mg/dL — ABNORMAL HIGH (ref 65–99)
Potassium: 4.7 mmol/L (ref 3.5–5.1)
Sodium: 140 mmol/L (ref 135–145)

## 2016-12-30 LAB — CBC
HEMATOCRIT: 38.2 % — AB (ref 39.0–52.0)
Hemoglobin: 12.7 g/dL — ABNORMAL LOW (ref 13.0–17.0)
MCH: 30.9 pg (ref 26.0–34.0)
MCHC: 33.2 g/dL (ref 30.0–36.0)
MCV: 92.9 fL (ref 78.0–100.0)
PLATELETS: 186 10*3/uL (ref 150–400)
RBC: 4.11 MIL/uL — ABNORMAL LOW (ref 4.22–5.81)
RDW: 14.1 % (ref 11.5–15.5)
WBC: 4.7 10*3/uL (ref 4.0–10.5)

## 2016-12-30 LAB — LIPID PANEL
Cholesterol: 151 mg/dL (ref 0–200)
HDL: 36 mg/dL — AB (ref >40)
LDL Cholesterol: 82 mg/dL (ref 0–99)
TRIGLYCERIDES: 164 mg/dL — AB (ref <150)
Total CHOL/HDL Ratio: 4.2 RATIO
VLDL: 33 mg/dL (ref 0–40)

## 2016-12-30 LAB — HEPARIN LEVEL (UNFRACTIONATED): Heparin Unfractionated: 0.38 IU/mL (ref 0.30–0.70)

## 2016-12-30 LAB — TROPONIN I
TROPONIN I: 0.52 ng/mL — AB (ref <0.03)
Troponin I: 0.69 ng/mL (ref <0.03)

## 2016-12-30 LAB — PROTIME-INR
INR: 1.05
PROTHROMBIN TIME: 13.7 s (ref 11.4–15.2)

## 2016-12-30 LAB — MRSA PCR SCREENING: MRSA BY PCR: NEGATIVE

## 2016-12-30 SURGERY — LEFT HEART CATH AND CORS/GRAFTS ANGIOGRAPHY
Anesthesia: LOCAL

## 2016-12-30 MED ORDER — LIDOCAINE HCL (PF) 1 % IJ SOLN
INTRAMUSCULAR | Status: DC | PRN
  Administered 2016-12-30: 2 mL

## 2016-12-30 MED ORDER — FENTANYL CITRATE (PF) 100 MCG/2ML IJ SOLN
INTRAMUSCULAR | Status: DC | PRN
  Administered 2016-12-30 (×3): 25 ug via INTRAVENOUS
  Administered 2016-12-30: 50 ug via INTRAVENOUS

## 2016-12-30 MED ORDER — HYDROMORPHONE HCL 1 MG/ML IJ SOLN
INTRAMUSCULAR | Status: AC
  Filled 2016-12-30: qty 0.5

## 2016-12-30 MED ORDER — MIDAZOLAM HCL 2 MG/2ML IJ SOLN
INTRAMUSCULAR | Status: AC
  Filled 2016-12-30: qty 2

## 2016-12-30 MED ORDER — SODIUM CHLORIDE 0.9% FLUSH: 3.0000 mL | INTRAVENOUS | Status: DC | PRN

## 2016-12-30 MED ORDER — IOPAMIDOL (ISOVUE-370) INJECTION 76%
INTRAVENOUS | Status: DC | PRN
  Administered 2016-12-30: 105 mL via INTRAVENOUS

## 2016-12-30 MED ORDER — HEPARIN (PORCINE) IN NACL 2-0.9 UNIT/ML-% IJ SOLN
INTRAMUSCULAR | Status: AC | PRN
  Administered 2016-12-30: 1000 mL

## 2016-12-30 MED ORDER — IOPAMIDOL (ISOVUE-370) INJECTION 76%
INTRAVENOUS | Status: AC
  Filled 2016-12-30: qty 100

## 2016-12-30 MED ORDER — LIDOCAINE HCL (PF) 1 % IJ SOLN
INTRAMUSCULAR | Status: AC
  Filled 2016-12-30: qty 30

## 2016-12-30 MED ORDER — FENTANYL CITRATE (PF) 100 MCG/2ML IJ SOLN
INTRAMUSCULAR | Status: AC
  Filled 2016-12-30: qty 2

## 2016-12-30 MED ORDER — HEPARIN SODIUM (PORCINE) 1000 UNIT/ML IJ SOLN
INTRAMUSCULAR | Status: DC | PRN
  Administered 2016-12-30: 4000 [IU] via INTRAVENOUS

## 2016-12-30 MED ORDER — SODIUM CHLORIDE 0.9 % IV SOLN: INTRAVENOUS | Status: DC

## 2016-12-30 MED ORDER — VERAPAMIL HCL 2.5 MG/ML IV SOLN
INTRAVENOUS | Status: DC | PRN
  Administered 2016-12-30: 3 mL via INTRA_ARTERIAL

## 2016-12-30 MED ORDER — SODIUM CHLORIDE 0.9 % IV SOLN
INTRAVENOUS | Status: AC
  Administered 2016-12-30: 19:00:00 via INTRAVENOUS

## 2016-12-30 MED ORDER — SODIUM CHLORIDE 0.9 % IV SOLN
INTRAVENOUS | Status: AC | PRN
  Administered 2016-12-30: 1 mL via INTRAVENOUS

## 2016-12-30 MED ORDER — SODIUM CHLORIDE 0.9 % IV SOLN: 250.0000 mL | INTRAVENOUS | Status: DC | PRN

## 2016-12-30 MED ORDER — HEPARIN SODIUM (PORCINE) 1000 UNIT/ML IJ SOLN
INTRAMUSCULAR | Status: AC
  Filled 2016-12-30: qty 1

## 2016-12-30 MED ORDER — HEPARIN (PORCINE) IN NACL 2-0.9 UNIT/ML-% IJ SOLN
INTRAMUSCULAR | Status: AC
  Filled 2016-12-30: qty 1000

## 2016-12-30 MED ORDER — VERAPAMIL HCL 2.5 MG/ML IV SOLN
INTRAVENOUS | Status: AC
  Filled 2016-12-30: qty 2

## 2016-12-30 MED ORDER — SODIUM CHLORIDE 0.9% FLUSH: 3.0000 mL | Freq: Two times a day (BID) | INTRAVENOUS | Status: DC

## 2016-12-30 MED ORDER — ACETAMINOPHEN 325 MG PO TABS: 650.0000 mg | ORAL_TABLET | ORAL | Status: DC | PRN

## 2016-12-30 MED ORDER — NITROGLYCERIN 1 MG/10 ML FOR IR/CATH LAB
INTRA_ARTERIAL | Status: DC | PRN
Start: 2016-12-30 — End: 2016-12-30
  Administered 2016-12-30: 200 ug via INTRA_ARTERIAL

## 2016-12-30 MED ORDER — HEPARIN (PORCINE) IN NACL 100-0.45 UNIT/ML-% IJ SOLN
1100.0000 [IU]/h | INTRAMUSCULAR | Status: DC
  Administered 2016-12-30: 1100 [IU]/h via INTRAVENOUS
  Filled 2016-12-30: qty 250

## 2016-12-30 MED ORDER — MIDAZOLAM HCL 2 MG/2ML IJ SOLN
INTRAMUSCULAR | Status: DC | PRN
  Administered 2016-12-30: 1 mg via INTRAVENOUS
  Administered 2016-12-30: 2 mg via INTRAVENOUS
  Administered 2016-12-30: 1 mg via INTRAVENOUS

## 2016-12-30 MED ORDER — IOPAMIDOL (ISOVUE-370) INJECTION 76%
INTRAVENOUS | Status: AC
  Filled 2016-12-30: qty 50

## 2016-12-30 MED ORDER — HEPARIN (PORCINE) IN NACL 100-0.45 UNIT/ML-% IJ SOLN: 1100.0000 [IU]/h | INTRAMUSCULAR | Status: DC

## 2016-12-30 MED ORDER — SALINE SPRAY 0.65 % NA SOLN
1.0000 | NASAL | Status: DC | PRN
  Filled 2016-12-30: qty 44

## 2016-12-30 MED ORDER — ISOSORBIDE MONONITRATE ER 30 MG PO TB24
30.0000 mg | ORAL_TABLET | Freq: Every day | ORAL | Status: DC
  Administered 2016-12-30 – 2017-01-01 (×3): 30 mg via ORAL
  Filled 2016-12-30 (×3): qty 1

## 2016-12-30 MED ORDER — HYDROMORPHONE HCL 1 MG/ML IJ SOLN
INTRAMUSCULAR | Status: DC | PRN
  Administered 2016-12-30: 0.5 mg via INTRAVENOUS

## 2016-12-30 MED ORDER — ONDANSETRON HCL 4 MG/2ML IJ SOLN
4.0000 mg | Freq: Four times a day (QID) | INTRAMUSCULAR | Status: DC | PRN
  Administered 2016-12-30: 4 mg via INTRAVENOUS
  Filled 2016-12-30: qty 2

## 2016-12-30 MED ORDER — SODIUM CHLORIDE 0.9% FLUSH
3.0000 mL | Freq: Two times a day (BID) | INTRAVENOUS | Status: DC
  Administered 2016-12-30 – 2016-12-31 (×3): 3 mL via INTRAVENOUS

## 2016-12-30 MED ORDER — HYDROMORPHONE HCL 1 MG/ML IJ SOLN
0.5000 mg | INTRAMUSCULAR | Status: DC | PRN
  Administered 2016-12-30: 2 mg via INTRAVENOUS
  Filled 2016-12-30: qty 2

## 2016-12-30 SURGICAL SUPPLY — 14 items
CATH INFINITI 5 FR IM (CATHETERS) ×2 IMPLANT
CATH INFINITI 5 FR LCB (CATHETERS) ×2 IMPLANT
CATH INFINITI 5 FR MPA2 (CATHETERS) ×2 IMPLANT
CATH INFINITI 5 FR RCB (CATHETERS) ×2 IMPLANT
CATH INFINITI 5FR AL1 (CATHETERS) ×2 IMPLANT
CATH INFINITI 5FR MULTPACK ANG (CATHETERS) ×2 IMPLANT
DEVICE RAD COMP TR BAND LRG (VASCULAR PRODUCTS) ×2 IMPLANT
GLIDESHEATH SLEND SS 6F .021 (SHEATH) ×2 IMPLANT
GUIDEWIRE INQWIRE 1.5J.035X260 (WIRE) ×1 IMPLANT
INQWIRE 1.5J .035X260CM (WIRE) ×2
KIT HEART LEFT (KITS) ×2 IMPLANT
PACK CARDIAC CATHETERIZATION (CUSTOM PROCEDURE TRAY) ×2 IMPLANT
TRANSDUCER W/STOPCOCK (MISCELLANEOUS) ×2 IMPLANT
TUBING CIL FLEX 10 FLL-RA (TUBING) ×2 IMPLANT

## 2016-12-30 NOTE — H&P (View-Only) (Signed)
Progress Note  Patient Name: Omar Benson Date of Encounter: 12/30/2016  Primary Cardiologist: Dr. Lovena Le  Subjective   Reports pain has resolved this morning. Breathing at baseline.   Inpatient Medications    Scheduled Meds: . allopurinol  300 mg Oral QHS  . amiodarone  100 mg Oral QHS  . aspirin EC  81 mg Oral QHS  . hydrALAZINE  75 mg Oral BID  . levothyroxine  125 mcg Oral QAC breakfast  . multivitamin with minerals  1 tablet Oral QHS  . nitroGLYCERIN  0.5 inch Topical Q6H  . pantoprazole  40 mg Oral QHS  . tiotropium  18 mcg Inhalation Daily   Continuous Infusions: . sodium chloride 50 mL/hr at 12/29/16 2002  . heparin 1,100 Units/hr (12/29/16 2205)   PRN Meds: acetaminophen, nitroGLYCERIN, ondansetron (ZOFRAN) IV   Vital Signs    Vitals:   12/29/16 2141 12/30/16 0215 12/30/16 0600 12/30/16 0624  BP: 137/71 109/61 119/69   Pulse: (!) 52 (!) 51 (!) 58   Resp: 18 18 18    Temp: 98 F (36.7 C) 98 F (36.7 C) 98.3 F (36.8 C)   TempSrc: Oral Oral Oral   SpO2: 100% 100% 96%   Weight: 182 lb 1.6 oz (82.6 kg)   181 lb 3.2 oz (82.2 kg)  Height: 5\' 9"  (1.753 m)       Intake/Output Summary (Last 24 hours) at 12/30/16 0810 Last data filed at 12/30/16 5885  Gross per 24 hour  Intake          1039.65 ml  Output              925 ml  Net           114.65 ml   Filed Weights   12/29/16 1603 12/29/16 2141 12/30/16 0624  Weight: 179 lb (81.2 kg) 182 lb 1.6 oz (82.6 kg) 181 lb 3.2 oz (82.2 kg)    Telemetry    Sinus bradycardia, HR in 50's.  - Personally Reviewed  ECG    Sinus bradycardia, HR 55, with RBBB and LAFB. Lateral TWI is now more distinct when compared to prior tracings. - Personally Reviewed  Physical Exam   General: Well developed, well nourished Caucasian male appearing in no acute distress. Head: Normocephalic, atraumatic.  Neck: Supple without bruits, JVD not elevated. Lungs:  Resp regular and unlabored, CTA without wheezing or rales. Heart:  Regular rhythm, bradycardiac rate, S1, S2, no S3, S4, or murmur; no rub. Abdomen: Soft, non-tender, non-distended with normoactive bowel sounds. No hepatomegaly. No rebound/guarding. No obvious abdominal masses. Extremities: No clubbing, cyanosis, or lower extremity edema. Distal pedal pulses are 2+ bilaterally. Neuro: Alert and oriented X 3. Moves all extremities spontaneously. Psych: Normal affect.  Labs    Chemistry Recent Labs Lab 12/29/16 1606  NA 137  K 4.6  CL 108  CO2 20*  GLUCOSE 115*  BUN 35*  CREATININE 2.11*  CALCIUM 9.4  GFRNONAA 28*  GFRAA 32*  ANIONGAP 9     Hematology Recent Labs Lab 12/29/16 1606  WBC 6.8  RBC 4.17*  HGB 13.1  HCT 38.7*  MCV 92.8  MCH 31.4  MCHC 33.9  RDW 14.1  PLT 217    Cardiac Enzymes Recent Labs Lab 12/29/16 2115 12/30/16 0219  TROPONINI 0.06* 0.52*    Recent Labs Lab 12/29/16 1616 12/29/16 1948  TROPIPOC 0.02 0.05     BNPNo results for input(s): BNP, PROBNP in the last 168 hours.   DDimer No  results for input(s): DDIMER in the last 168 hours.   Radiology    Dg Chest 2 View  Result Date: 12/29/2016 CLINICAL DATA:  Chest pain. EXAM: CHEST  2 VIEW COMPARISON:  Chest x-ray dated December 19, 2016. FINDINGS: Prior CABG. The cardiomediastinal silhouette is normal in size. Normal pulmonary vascularity. Atherosclerotic calcification of the aortic arch. Bibasilar atelectasis. No focal consolidation, pleural effusion, or pneumothorax. No acute osseous abnormality. IMPRESSION: No active cardiopulmonary disease. Electronically Signed   By: Titus Dubin M.D.   On: 12/29/2016 16:33    Cardiac Studies   Echocardiogram: 12/20/2016 Study Conclusions  - Left ventricle: The cavity size was normal. There was mild   concentric hypertrophy. Systolic function was normal. The   estimated ejection fraction was in the range of 55% to 60%. Wall   motion was normal; there were no regional wall motion   abnormalities. Doppler  parameters are consistent with abnormal   left ventricular relaxation (grade 1 diastolic dysfunction). - Mitral valve: Calcified annulus. - Left atrium: The atrium was mildly dilated. - Atrial septum: No defect or patent foramen ovale was identified.  Patient Profile     82 y.o. male w/ PMH of CAD (s/p CABG in 2010, cath in 06/2015 showing patent LIMA-LAD, patent SVG-D1, patent SVG-RCA, and severely diseased SVG-OM (not amenable to PCI), ischemic heart disease, PAF (s/p ablation), HTN, HLD, COPD, and Stage 3 CKD who presented to [facility] ED on 12/29/2016 for evaluation of chest pain.   Assessment & Plan    1. NSTEMI - previously admitted from 8/4 - 12/22/2016 for an NSTEMI with peak troponin of 0.98. Initial troponin this admission at 0.06, increased to 0.52 this AM. Has been started on Heparin.  - he denies any recurrent chest pain.  - the patient strongly wishes to have a cardiac catheterization as he "does not want to keep coming to the hospital every week". He is being hydrated for his AKI. If creatinine is close to baseline on recheck, perhaps he can go for cath later today. If not, will need to continue hydration and reassess tomorrow. Remains NPO.  Increased Imdur from 15mg  daily to 30mg  daily.   2. CAD - s/p CABG in 2010 with most recent cath in 06/2015 showing patent LIMA-LAD, patent SVG-D1, patent SVG-RCA, and severely diseased SVG-OM (not amenable to PCI and unchanged from cath in 2013).  - continue ASA, Heparin, and Imdur. Intolerant to statins. No BB therapy with baseline bradycardia.   3. HTN - BP is well-controlled. Continue current medication regimen.   4. HLD - Lipid Panel shows total cholesterol of 151, HDL 36, and LDL 82. - has been intolerant to multiple statins. Consider referral to the Lipid Clinic for initiation of PCSK-9 inhibitor as an outpatient.   5. PAF - s/p ablation in 2004. Remains on low-dose Amiodarone 100mg  daily. Not on any AV-nodal blocking agents  secondary to baseline bradycardia.   6. Acute on Chronic Stage 3 CKD - creatinine at 1.79 on 12/22/2016, elevated to 2.11 on admission. Have called Phlebotomy and asked for morning labs to be drawn now so we can reassess kidney function.   Signed, Erma Heritage , PA-C 8:10 AM 12/30/2016 Pager: [phone]

## 2016-12-30 NOTE — Consult Note (Signed)
   [facility] CM Inpatient Consult   12/30/2016  [name] 19-Sep-1934 [id number]  Referral received to assess for care management services. Patient is a Retail banker.    Met with the patient and wife at the bedside regarding the benefits of [facility] Care Management services.  Patient admitted for unstable angina with HX of CABG, COPD Gold, and 3rd admission in the past 6 months.  Patient endorses Dr. [name] as his primary care provider with [facility].  This office generally provides the transition of care follow up as well as his primary cardiologist.  Explained that [facility] is a covered benefit of insurance. Review information for [facility] Care Management and a folder was provided with contact information.  Explained that [facility] does not interfere with or replace any services arranged by the inpatient care management staff. Patient accepted a brochure with contact information and the 24 hour nurse advise line magnet.  Patient declined services with [facility] at this time.  Patient denies barriers for follow up appointments, adherence to medications or cost issues, nor any social needs, stating, "I have good support and follow up.    For questions, please contact:  Natividad Brood, RN BSN [facility] Liaison  [phone] business mobile phone Toll free office [phone]

## 2016-12-30 NOTE — Interval H&P Note (Signed)
Cath Lab Visit (complete for each Cath Lab visit)  Clinical Evaluation Leading to the Procedure:   ACS: Yes.    Non-ACS:    Anginal Classification: CCS IV  Anti-ischemic medical therapy: Minimal Therapy (1 class of medications)  Non-Invasive Test Results: No non-invasive testing performed  Prior CABG: Previous CABG      History and Physical Interval Note:  12/30/2016 4:03 PM  Omar Benson  has presented today for surgery, with the diagnosis of nstemi  The various methods of treatment have been discussed with the patient and family. After consideration of risks, benefits and other options for treatment, the patient has consented to  Procedure(s): LEFT HEART CATH AND CORS/GRAFTS ANGIOGRAPHY (N/A) as a surgical intervention .  The patient's history has been reviewed, patient examined, no change in status, stable for surgery.  I have reviewed the patient's chart and labs.  Questions were answered to the patient's satisfaction.     Larae Grooms

## 2016-12-30 NOTE — Progress Notes (Signed)
Progress Note  Patient Name: Omar Benson Date of Encounter: 12/30/2016  Primary Cardiologist: Dr. Lovena Le  Subjective   Reports pain has resolved this morning. Breathing at baseline.   Inpatient Medications    Scheduled Meds: . allopurinol  300 mg Oral QHS  . amiodarone  100 mg Oral QHS  . aspirin EC  81 mg Oral QHS  . hydrALAZINE  75 mg Oral BID  . levothyroxine  125 mcg Oral QAC breakfast  . multivitamin with minerals  1 tablet Oral QHS  . nitroGLYCERIN  0.5 inch Topical Q6H  . pantoprazole  40 mg Oral QHS  . tiotropium  18 mcg Inhalation Daily   Continuous Infusions: . sodium chloride 50 mL/hr at 12/29/16 2002  . heparin 1,100 Units/hr (12/29/16 2205)   PRN Meds: acetaminophen, nitroGLYCERIN, ondansetron (ZOFRAN) IV   Vital Signs    Vitals:   12/29/16 2141 12/30/16 0215 12/30/16 0600 12/30/16 0624  BP: 137/71 109/61 119/69   Pulse: (!) 52 (!) 51 (!) 58   Resp: 18 18 18    Temp: 98 F (36.7 C) 98 F (36.7 C) 98.3 F (36.8 C)   TempSrc: Oral Oral Oral   SpO2: 100% 100% 96%   Weight: 182 lb 1.6 oz (82.6 kg)   181 lb 3.2 oz (82.2 kg)  Height: 5\' 9"  (1.753 m)       Intake/Output Summary (Last 24 hours) at 12/30/16 0810 Last data filed at 12/30/16 0109  Gross per 24 hour  Intake          1039.65 ml  Output              925 ml  Net           114.65 ml   Filed Weights   12/29/16 1603 12/29/16 2141 12/30/16 0624  Weight: 179 lb (81.2 kg) 182 lb 1.6 oz (82.6 kg) 181 lb 3.2 oz (82.2 kg)    Telemetry    Sinus bradycardia, HR in 50's.  - Personally Reviewed  ECG    Sinus bradycardia, HR 55, with RBBB and LAFB. Lateral TWI is now more distinct when compared to prior tracings. - Personally Reviewed  Physical Exam   General: Well developed, well nourished Caucasian male appearing in no acute distress. Head: Normocephalic, atraumatic.  Neck: Supple without bruits, JVD not elevated. Lungs:  Resp regular and unlabored, CTA without wheezing or rales. Heart:  Regular rhythm, bradycardiac rate, S1, S2, no S3, S4, or murmur; no rub. Abdomen: Soft, non-tender, non-distended with normoactive bowel sounds. No hepatomegaly. No rebound/guarding. No obvious abdominal masses. Extremities: No clubbing, cyanosis, or lower extremity edema. Distal pedal pulses are 2+ bilaterally. Neuro: Alert and oriented X 3. Moves all extremities spontaneously. Psych: Normal affect.  Labs    Chemistry Recent Labs Lab 12/29/16 1606  NA 137  K 4.6  CL 108  CO2 20*  GLUCOSE 115*  BUN 35*  CREATININE 2.11*  CALCIUM 9.4  GFRNONAA 28*  GFRAA 32*  ANIONGAP 9     Hematology Recent Labs Lab 12/29/16 1606  WBC 6.8  RBC 4.17*  HGB 13.1  HCT 38.7*  MCV 92.8  MCH 31.4  MCHC 33.9  RDW 14.1  PLT 217    Cardiac Enzymes Recent Labs Lab 12/29/16 2115 12/30/16 0219  TROPONINI 0.06* 0.52*    Recent Labs Lab 12/29/16 1616 12/29/16 1948  TROPIPOC 0.02 0.05     BNPNo results for input(s): BNP, PROBNP in the last 168 hours.   DDimer No  results for input(s): DDIMER in the last 168 hours.   Radiology    Dg Chest 2 View  Result Date: 12/29/2016 CLINICAL DATA:  Chest pain. EXAM: CHEST  2 VIEW COMPARISON:  Chest x-ray dated December 19, 2016. FINDINGS: Prior CABG. The cardiomediastinal silhouette is normal in size. Normal pulmonary vascularity. Atherosclerotic calcification of the aortic arch. Bibasilar atelectasis. No focal consolidation, pleural effusion, or pneumothorax. No acute osseous abnormality. IMPRESSION: No active cardiopulmonary disease. Electronically Signed   By: Titus Dubin M.D.   On: 12/29/2016 16:33    Cardiac Studies   Echocardiogram: 12/20/2016 Study Conclusions  - Left ventricle: The cavity size was normal. There was mild   concentric hypertrophy. Systolic function was normal. The   estimated ejection fraction was in the range of 55% to 60%. Wall   motion was normal; there were no regional wall motion   abnormalities. Doppler  parameters are consistent with abnormal   left ventricular relaxation (grade 1 diastolic dysfunction). - Mitral valve: Calcified annulus. - Left atrium: The atrium was mildly dilated. - Atrial septum: No defect or patent foramen ovale was identified.  Patient Profile     82 y.o. male w/ PMH of CAD (s/p CABG in 2010, cath in 06/2015 showing patent LIMA-LAD, patent SVG-D1, patent SVG-RCA, and severely diseased SVG-OM (not amenable to PCI), ischemic heart disease, PAF (s/p ablation), HTN, HLD, COPD, and Stage 3 CKD who presented to [facility] ED on 12/29/2016 for evaluation of chest pain.   Assessment & Plan    1. NSTEMI - previously admitted from 8/4 - 12/22/2016 for an NSTEMI with peak troponin of 0.98. Initial troponin this admission at 0.06, increased to 0.52 this AM. Has been started on Heparin.  - he denies any recurrent chest pain.  - the patient strongly wishes to have a cardiac catheterization as he "does not want to keep coming to the hospital every week". He is being hydrated for his AKI. If creatinine is close to baseline on recheck, perhaps he can go for cath later today. If not, will need to continue hydration and reassess tomorrow. Remains NPO.  Increased Imdur from 15mg  daily to 30mg  daily.   2. CAD - s/p CABG in 2010 with most recent cath in 06/2015 showing patent LIMA-LAD, patent SVG-D1, patent SVG-RCA, and severely diseased SVG-OM (not amenable to PCI and unchanged from cath in 2013).  - continue ASA, Heparin, and Imdur. Intolerant to statins. No BB therapy with baseline bradycardia.   3. HTN - BP is well-controlled. Continue current medication regimen.   4. HLD - Lipid Panel shows total cholesterol of 151, HDL 36, and LDL 82. - has been intolerant to multiple statins. Consider referral to the Lipid Clinic for initiation of PCSK-9 inhibitor as an outpatient.   5. PAF - s/p ablation in 2004. Remains on low-dose Amiodarone 100mg  daily. Not on any AV-nodal blocking agents  secondary to baseline bradycardia.   6. Acute on Chronic Stage 3 CKD - creatinine at 1.79 on 12/22/2016, elevated to 2.11 on admission. Have called Phlebotomy and asked for morning labs to be drawn now so we can reassess kidney function.   Signed, Erma Heritage , PA-C 8:10 AM 12/30/2016 Pager: [phone]

## 2016-12-30 NOTE — Progress Notes (Addendum)
Pt is alert and oriented Off to cath and  off tele monitor. CCMD notified

## 2016-12-30 NOTE — Telephone Encounter (Signed)
Patient insurances and benefits verified. Patient insurances are Medicare A/B and [place]  Medicare supplement. Medicare A/B - no co-payment, deductible $183.00/4183.00 has been met, no out of pocket, 20% co-insurance and no pre-authorization. Passport/reference [phone]. [employer] - no co-payment, no deductible, no out of pocket and no co-insurance and no pre-authorization. Follow medicare guidelines. Passport/reference [phone].   Patient will be contacted and scheduled after their follow up appointment with the cardiologist on 01/15/17, upon review by [facility] RN navigator.

## 2016-12-30 NOTE — Progress Notes (Signed)
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 Nitro patch applied, hasn't asked for pain medication.

## 2016-12-30 NOTE — Progress Notes (Signed)
ANTICOAGULATION CONSULT NOTE - Follow up Pharmacy Consult for heparin  Indication: chest pain/ACS  Allergies  Allergen Reactions  . Fish Allergy Diarrhea and Nausea And Vomiting  . Promethazine Hcl Other (See Comments)    Syncope (reaction to phenergan)  . Shellfish Allergy Diarrhea and Nausea And Vomiting  . Yellow Jacket Venom [Bee Venom] Swelling    Localized swelling from yellow jackets and honey bees  . Doxycycline Hyclate Other (See Comments)    esophagitis  . Lipitor [Atorvastatin Calcium] Other (See Comments)    Myalgia/myopathy  . Tetracycline Other (See Comments)    Esophagitis  . Crestor [Rosuvastatin] Other (See Comments)    Myalgia/myopathy  . Tetanus Toxoid Swelling and Rash    Patient Measurements: Height: 5\' 9"  (175.3 cm) Weight: 181 lb 3.2 oz (82.2 kg) IBW/kg (Calculated) : 70.7 Heparin Dosing Weight: 81 kg   Vital Signs: Temp: 97.6 F (36.4 C) (08/15 0859) Temp Source: Oral (08/15 0859) BP: 129/67 (08/15 0859) Pulse Rate: 53 (08/15 0859)  Labs:  Recent Labs  12/29/16 1606 12/29/16 2115 12/30/16 0219 12/30/16 0934  HGB 13.1  --   --  12.7*  HCT 38.7*  --   --  38.2*  PLT 217  --   --  186  HEPARINUNFRC  --   --   --  0.38  CREATININE 2.11*  --   --  1.84*  TROPONINI  --  0.06* 0.52* 0.69*    Estimated Creatinine Clearance: 32 mL/min (A) (by C-G formula based on SCr of 1.84 mg/dL (H)).   Medical History: Past Medical History:  Diagnosis Date  . AAA 09/19/2008  . ANEMIA-IRON DEFICIENCY 09/19/2008  . Anxiety    "related to my health; when BP rises, etc" (12/29/2016)  . Arthritis    no meds - knee/back (12/29/2016)  . Atrial fibrillation ([place]) 04/19/2008   amiodarone rx;  Echocardiogram 05/26/11: No wall motion abnormalities, mild LVH, EF 60%.  . Atrial flutter ([place]) 12/05/2008   s/p RFCA - ablation  . Benign esophageal stricture ~ 2007   dilated during EGD  . BRADYCARDIA 12/05/2008  . CAD 12/05/2008   s/p CABG; NSTEMI 05/2011 - LHC 05/25/11: LAD  occluded, LIMA-LAD patent, ostial circumflex 50%, AV circumflex 70%, SVG-OM2 with extensive disease with thrombus (culprit vessel), RCA 80% and occluded, SVG-intermediate patent, SVG-PDA/PL A patent.  Given that the culprit vessel was a subtotally occluded heavily thrombotic graft to a smaller OM2, medical therapy was recommended.;   . Cataract    left - small - no treatment yet  . Chronic upper back pain    "between the shoulders" (12/29/2016)  . CKD (chronic kidney disease) 09/19/2008   Qualifier: Diagnosis of  By: Jenny Reichmann MD, Hunt Oris  -- Stage 3  . COLONIC POLYPS, HX OF 09/19/2008   adenomatous polyps 07/2010  . COPD (chronic obstructive pulmonary disease) ([place])   . CORONARY ARTERY BYPASS GRAFT, HX OF    A. LIMA-LAD, VG-RI, VG-OM2, VG-RPD/RPL;  B. 05/2011 - NSTEMI - CATH WITH 4/5 PATENT GRAFTS AND NEW THROMBUS IN DISTAL VG-OM2 - MED RX  . DIVERTICULOSIS, COLON 09/19/2008  . Emphysema of lung ([place])    "they say I have a little" (12/29/2016)  . Environmental allergies    "allergic to a couple kinds of trees; nothing major" (12/29/2016)  . Erectile dysfunction 09/13/2012  . GASTROINTESTINAL HEMORRHAGE, HX OF 04/19/2008  . GERD 09/19/2008  . GOUT 09/19/2008  . History of blood transfusion 04/19/2008   2 units transfused; "bleeding in my  intestines; related to [employer]  . History of kidney stones   . HYPERLIPIDEMIA 09/19/2008   no meds - diet controlled  . HYPERTENSION 09/19/2008   patient denies this dx - no meds for HTN  . Hypothyroidism 12/05/2008  . Myocardial infarction ([place]) 2013, 2017   both minor - left anterior vessel  . PERIPHERAL VASCULAR DISEASE 09/19/2008  . PSA, INCREASED 09/19/2008  . RENAL INSUFFICIENCY 09/19/2008  . Unstable angina pectoris ([place]) 05/2015   Cath 02/02 w/ patent LIMA-LAD, SVG-D1, SVG-RCA, severe dz in SVG-OM, med rx    Assessment: 66 yom presented to the hospital with CP. To start IV heparin. Baseline CBC is WNL. He was not on anticoagulation PTA. Planning no boluses due to  history of GIB.   Initial 8 hr HL = 0.38 on 1100 units/hr.  Cardiologist noting chest pain has resolved, but enzymes again elevated consistent with NSTEMI.  H/H low/stable, pltc wnl, No bleeding noted  Goal of Therapy:  Heparin level 0.3-0.7 units/ml Monitor platelets by anticoagulation protocol: Yes   Plan:  Continue Heparin gtt 1100 units/hr - NO BOLUS Check a 6 hr heparin level to confirm remains therapeutic Daily heparin level and CBC F/u post cath if goes for cath today.  Thank you for allowing pharmacy to be part of this patients care team.  Nicole Cella, RPh Clinical Pharmacist Pager: [phone] [place] [phone] 12/30/2016, 11:35 AM

## 2016-12-30 NOTE — Progress Notes (Signed)
Critical troponin 0.52, MD notified, patient not experiencing chest pain. EKG in process.

## 2016-12-30 NOTE — Progress Notes (Signed)
ANTICOAGULATION CONSULT NOTE - Follow up Pharmacy Consult for heparin  Indication: chest pain/ACS s/p cath  Allergies  Allergen Reactions  . Fish Allergy Diarrhea and Nausea And Vomiting  . Promethazine Hcl Other (See Comments)    Syncope (reaction to phenergan)  . Shellfish Allergy Diarrhea and Nausea And Vomiting  . Yellow Jacket Venom [Bee Venom] Swelling    Localized swelling from yellow jackets and honey bees  . Doxycycline Hyclate Other (See Comments)    esophagitis  . Lipitor [Atorvastatin Calcium] Other (See Comments)    Myalgia/myopathy  . Tetracycline Other (See Comments)    Esophagitis  . Crestor [Rosuvastatin] Other (See Comments)    Myalgia/myopathy  . Tetanus Toxoid Swelling and Rash    Labs:  Recent Labs  12/29/16 1606 12/29/16 2115 12/30/16 0219 12/30/16 0934 12/30/16 1342  HGB 13.1  --   --  12.7*  --   HCT 38.7*  --   --  38.2*  --   PLT 217  --   --  186  --   LABPROT  --   --   --   --  13.7  INR  --   --   --   --  1.05  HEPARINUNFRC  --   --   --  0.38  --   CREATININE 2.11*  --   --  1.84*  --   TROPONINI  --  0.06* 0.52* 0.69*  --     Estimated Creatinine Clearance: 32 mL/min (A) (by C-G formula based on SCr of 1.84 mg/dL (H)).   Assessment: 35 yom presented to the hospital with CP now s/p cath with plans for PCI 8/16 To resume heparin 6 hours post sheath removal at 17:30 pm  Goal of Therapy:  Heparin level 0.3-0.7 units/ml Monitor platelets by anticoagulation protocol: Yes   Plan:  Resume heparin at 1100 units / hr at 2330 pm Heparin level at 7:30 am 8/16  Thank you  Anette Guarneri, PharmD [phone] 12/30/2016, 6:15 PM

## 2016-12-31 ENCOUNTER — Encounter (HOSPITAL_COMMUNITY): Payer: Self-pay | Admitting: Interventional Cardiology

## 2016-12-31 ENCOUNTER — Other Ambulatory Visit: Payer: Self-pay

## 2016-12-31 ENCOUNTER — Encounter (HOSPITAL_COMMUNITY)
Admission: EM | Disposition: A | Discharge status: Home / Self Care | Payer: Self-pay | Source: Home / Self Care | Attending: Internal Medicine

## 2016-12-31 DIAGNOSIS — I25710 Atherosclerosis of autologous vein coronary artery bypass graft(s) with unstable angina pectoris: Secondary | ICD-10-CM

## 2016-12-31 HISTORY — PX: CORONARY STENT INTERVENTION: CATH118234

## 2016-12-31 LAB — BASIC METABOLIC PANEL
Anion gap: 8 (ref 5–15)
BUN: 19 mg/dL (ref 6–20)
CALCIUM: 9.1 mg/dL (ref 8.9–10.3)
CHLORIDE: 109 mmol/L (ref 101–111)
CO2: 21 mmol/L — ABNORMAL LOW (ref 22–32)
CREATININE: 1.62 mg/dL — AB (ref 0.61–1.24)
GFR, EST AFRICAN AMERICAN: 45 mL/min — AB (ref >60)
GFR, EST NON AFRICAN AMERICAN: 38 mL/min — AB (ref >60)
Glucose, Bld: 107 mg/dL — ABNORMAL HIGH (ref 65–99)
POTASSIUM: 5.1 mmol/L (ref 3.5–5.1)
SODIUM: 138 mmol/L (ref 135–145)

## 2016-12-31 LAB — CBC
HCT: 37.8 % — ABNORMAL LOW (ref 39.0–52.0)
HEMOGLOBIN: 12.6 g/dL — AB (ref 13.0–17.0)
MCH: 31.1 pg (ref 26.0–34.0)
MCHC: 33.3 g/dL (ref 30.0–36.0)
MCV: 93.3 fL (ref 78.0–100.0)
PLATELETS: 179 10*3/uL (ref 150–400)
RBC: 4.05 MIL/uL — AB (ref 4.22–5.81)
RDW: 14.3 % (ref 11.5–15.5)
WBC: 8.1 10*3/uL (ref 4.0–10.5)

## 2016-12-31 LAB — TROPONIN I
TROPONIN I: 0.16 ng/mL — AB (ref <0.03)
TROPONIN I: 0.94 ng/mL — AB (ref <0.03)
Troponin I: 1.55 ng/mL (ref <0.03)

## 2016-12-31 LAB — POCT ACTIVATED CLOTTING TIME: Activated Clotting Time: 378 seconds

## 2016-12-31 LAB — HEPARIN LEVEL (UNFRACTIONATED): HEPARIN UNFRACTIONATED: 0.4 [IU]/mL (ref 0.30–0.70)

## 2016-12-31 SURGERY — CORONARY STENT INTERVENTION
Anesthesia: LOCAL

## 2016-12-31 MED ORDER — HYDRALAZINE HCL 20 MG/ML IJ SOLN: 5.0000 mg | INTRAMUSCULAR | Status: AC | PRN

## 2016-12-31 MED ORDER — ONDANSETRON HCL 4 MG/2ML IJ SOLN: 4.0000 mg | Freq: Four times a day (QID) | INTRAMUSCULAR | Status: DC | PRN

## 2016-12-31 MED ORDER — BIVALIRUDIN TRIFLUOROACETATE 250 MG IV SOLR
INTRAVENOUS | Status: AC
  Filled 2016-12-31: qty 250

## 2016-12-31 MED ORDER — ASPIRIN 81 MG PO CHEW
CHEWABLE_TABLET | ORAL | Status: DC | PRN
  Administered 2016-12-31: 81 mg via ORAL

## 2016-12-31 MED ORDER — SODIUM CHLORIDE 0.9% FLUSH
3.0000 mL | Freq: Two times a day (BID) | INTRAVENOUS | Status: DC
  Administered 2016-12-31: 3 mL via INTRAVENOUS

## 2016-12-31 MED ORDER — ACETAMINOPHEN 325 MG PO TABS
650.0000 mg | ORAL_TABLET | ORAL | Status: DC | PRN
  Administered 2017-01-01: 650 mg via ORAL
  Filled 2016-12-31: qty 2

## 2016-12-31 MED ORDER — HEPARIN (PORCINE) IN NACL 2-0.9 UNIT/ML-% IJ SOLN
INTRAMUSCULAR | Status: AC
  Filled 2016-12-31: qty 500

## 2016-12-31 MED ORDER — ASPIRIN 81 MG PO CHEW
81.0000 mg | CHEWABLE_TABLET | Freq: Every day | ORAL | Status: DC
  Administered 2017-01-01: 81 mg via ORAL
  Filled 2016-12-31: qty 1

## 2016-12-31 MED ORDER — TICAGRELOR 90 MG PO TABS
ORAL_TABLET | ORAL | Status: DC | PRN
  Administered 2016-12-31: 180 mg via ORAL

## 2016-12-31 MED ORDER — MIDAZOLAM HCL 2 MG/2ML IJ SOLN
INTRAMUSCULAR | Status: AC
Start: 2016-12-31 — End: ?
  Filled 2016-12-31: qty 2

## 2016-12-31 MED ORDER — IOPAMIDOL (ISOVUE-370) INJECTION 76%
INTRAVENOUS | Status: AC
  Filled 2016-12-31: qty 100

## 2016-12-31 MED ORDER — IOPAMIDOL (ISOVUE-370) INJECTION 76%
INTRAVENOUS | Status: AC
  Filled 2016-12-31: qty 50

## 2016-12-31 MED ORDER — MIDAZOLAM HCL 2 MG/2ML IJ SOLN
INTRAMUSCULAR | Status: DC | PRN
  Administered 2016-12-31 (×2): 1 mg via INTRAVENOUS
  Administered 2016-12-31: 2 mg via INTRAVENOUS

## 2016-12-31 MED ORDER — TICAGRELOR 90 MG PO TABS
ORAL_TABLET | ORAL | Status: AC
  Filled 2016-12-31: qty 1

## 2016-12-31 MED ORDER — NITROGLYCERIN 1 MG/10 ML FOR IR/CATH LAB
INTRA_ARTERIAL | Status: DC | PRN
  Administered 2016-12-31: 200 ug via INTRACORONARY
  Administered 2016-12-31 (×2): 200 ug via INTRA_ARTERIAL
  Administered 2016-12-31 (×2): 200 ug via INTRACORONARY

## 2016-12-31 MED ORDER — DIAZEPAM 5 MG PO TABS
5.0000 mg | ORAL_TABLET | Freq: Four times a day (QID) | ORAL | Status: DC | PRN
  Administered 2016-12-31 – 2017-01-01 (×2): 5 mg via ORAL
  Filled 2016-12-31 (×2): qty 1

## 2016-12-31 MED ORDER — MORPHINE SULFATE (PF) 4 MG/ML IV SOLN
2.0000 mg | Freq: Once | INTRAVENOUS | Status: AC
  Administered 2016-12-31: 20:00:00 2 mg via INTRAVENOUS
  Filled 2016-12-31: qty 1

## 2016-12-31 MED ORDER — SODIUM CHLORIDE 0.9 % IV SOLN: 250.0000 mL | INTRAVENOUS | Status: DC | PRN

## 2016-12-31 MED ORDER — SODIUM CHLORIDE 0.9 % WEIGHT BASED INFUSION: 3.0000 mL/kg/h | INTRAVENOUS | Status: DC

## 2016-12-31 MED ORDER — SODIUM CHLORIDE 0.9 % IV SOLN
INTRAVENOUS | Status: AC | PRN
  Administered 2016-12-31: 1.75 mg/kg/h via INTRAVENOUS
  Administered 2016-12-31: 15:00:00

## 2016-12-31 MED ORDER — MIDAZOLAM HCL 2 MG/2ML IJ SOLN
INTRAMUSCULAR | Status: AC
  Filled 2016-12-31: qty 2

## 2016-12-31 MED ORDER — IOPAMIDOL (ISOVUE-370) INJECTION 76%
INTRAVENOUS | Status: DC | PRN
  Administered 2016-12-31: 155 mL via INTRAVENOUS

## 2016-12-31 MED ORDER — VERAPAMIL HCL 2.5 MG/ML IV SOLN
INTRAVENOUS | Status: AC
  Filled 2016-12-31: qty 2

## 2016-12-31 MED ORDER — LABETALOL HCL 5 MG/ML IV SOLN: 10.0000 mg | INTRAVENOUS | Status: AC | PRN

## 2016-12-31 MED ORDER — FENTANYL CITRATE (PF) 100 MCG/2ML IJ SOLN
INTRAMUSCULAR | Status: AC
  Filled 2016-12-31: qty 2

## 2016-12-31 MED ORDER — BIVALIRUDIN BOLUS VIA INFUSION - CUPID
INTRAVENOUS | Status: DC | PRN
  Administered 2016-12-31: 62.7 mg via INTRAVENOUS

## 2016-12-31 MED ORDER — HEPARIN (PORCINE) IN NACL 2-0.9 UNIT/ML-% IJ SOLN
INTRAMUSCULAR | Status: AC | PRN
  Administered 2016-12-31: 1000 mL

## 2016-12-31 MED ORDER — FENTANYL CITRATE (PF) 100 MCG/2ML IJ SOLN
INTRAMUSCULAR | Status: DC | PRN
  Administered 2016-12-31: 25 ug via INTRAVENOUS
  Administered 2016-12-31: 50 ug via INTRAVENOUS
  Administered 2016-12-31 (×2): 25 ug via INTRAVENOUS

## 2016-12-31 MED ORDER — SODIUM CHLORIDE 0.9% FLUSH: 3.0000 mL | INTRAVENOUS | Status: DC | PRN

## 2016-12-31 MED ORDER — LIDOCAINE HCL (PF) 1 % IJ SOLN
INTRAMUSCULAR | Status: DC | PRN
  Administered 2016-12-31: 15 mL

## 2016-12-31 MED ORDER — ASPIRIN 81 MG PO CHEW
CHEWABLE_TABLET | ORAL | Status: AC
  Filled 2016-12-31: qty 1

## 2016-12-31 MED ORDER — SODIUM CHLORIDE 0.9 % WEIGHT BASED INFUSION
1.0000 mL/kg/h | INTRAVENOUS | Status: DC
  Administered 2016-12-31: 1 mL/kg/h via INTRAVENOUS

## 2016-12-31 MED ORDER — TICAGRELOR 90 MG PO TABS
90.0000 mg | ORAL_TABLET | Freq: Two times a day (BID) | ORAL | Status: DC
  Administered 2017-01-01 (×2): 90 mg via ORAL
  Filled 2016-12-31 (×2): qty 1

## 2016-12-31 MED ORDER — SODIUM CHLORIDE 0.9 % IV SOLN
INTRAVENOUS | Status: DC
  Administered 2016-12-31: 18:00:00 via INTRAVENOUS
  Administered 2017-01-01: 02:00:00 1000 mL via INTRAVENOUS

## 2016-12-31 SURGICAL SUPPLY — 18 items
BALLN EMERGE MR 2.0X15 (BALLOONS) ×2
BALLN ~~LOC~~ EMERGE MR 2.75X12 (BALLOONS) ×2
BALLOON EMERGE MR 2.0X15 (BALLOONS) ×1 IMPLANT
BALLOON ~~LOC~~ EMERGE MR 2.75X12 (BALLOONS) ×1 IMPLANT
CATH INFINITI 5 FR AL2 (CATHETERS) ×2 IMPLANT
CATH INFINITI 5 FR RCB (CATHETERS) ×2 IMPLANT
CATHETER LAUNCHER 6FR LCB (CATHETERS) ×2 IMPLANT
CATHETER LAUNCHER 6FR RCB (CATHETERS) ×2 IMPLANT
KIT ENCORE 26 ADVANTAGE (KITS) ×2 IMPLANT
KIT HEART LEFT (KITS) ×2 IMPLANT
PACK CARDIAC CATHETERIZATION (CUSTOM PROCEDURE TRAY) ×2 IMPLANT
SHEATH PINNACLE 6F 10CM (SHEATH) ×2 IMPLANT
STENT RESOLUTE ONYX 2.5X22 (Permanent Stent) ×2 IMPLANT
STENT RESOLUTE ONYX 2.75X18 (Permanent Stent) ×2 IMPLANT
TRANSDUCER W/STOPCOCK (MISCELLANEOUS) ×2 IMPLANT
TUBING CIL FLEX 10 FLL-RA (TUBING) ×2 IMPLANT
WIRE COUGAR XT STRL 190CM (WIRE) ×2 IMPLANT
WIRE EMERALD 3MM-J .035X150CM (WIRE) ×2 IMPLANT

## 2016-12-31 NOTE — H&P (View-Only) (Signed)
DAILY PROGRESS NOTE   Patient Name: Omar Benson Date of Encounter: 12/31/2016  Hospital Problem List   Principal Problem:   Unstable angina [facility]) Active Problems:   Hyperlipidemia LDL goal <70   Coronary atherosclerosis, CABG 2004   ATRIAL FLUTTER, CHRONIC   CKD (chronic kidney disease), stage III   Acute on chronic renal failure [facility])    Chief Complaint   No chest pain  Subjective   Diagnostic cath yesterday - possible target of PCI at the SVG to diagonal anastamosis. There is severe diffuse disease to the SVG to OM - high risk for no-reflow. Creatinine further improved today with hydration.  Objective   Vitals:   12/31/16 0400 12/31/16 0407 12/31/16 0500 12/31/16 0600  BP: 115/70  122/73 111/65  Pulse: (!) 59  60 63  Resp: 19  16 (!) 22  Temp:  97.9 F (36.6 C)    TempSrc:  Oral    SpO2: 96%  97% 98%  Weight:   184 lb 4.9 oz (83.6 kg)   Height:        Intake/Output Summary (Last 24 hours) at 12/31/16 0954 Last data filed at 12/31/16 0900  Gross per 24 hour  Intake          1016.82 ml  Output             1300 ml  Net          -283.18 ml   Filed Weights   12/29/16 2141 12/30/16 0624 12/31/16 0500  Weight: 182 lb 1.6 oz (82.6 kg) 181 lb 3.2 oz (82.2 kg) 184 lb 4.9 oz (83.6 kg)    Physical Exam   General appearance: alert and no distress Lungs: clear to auscultation bilaterally Heart: regular rate and rhythm, S1, S2 normal, no murmur, click, rub or gallop Extremities: extremities normal, atraumatic, no cyanosis or edema Neurologic: Grossly normal  Inpatient Medications    Scheduled Meds: . allopurinol  300 mg Oral QHS  . amiodarone  100 mg Oral QHS  . aspirin EC  81 mg Oral QHS  . hydrALAZINE  75 mg Oral BID  . isosorbide mononitrate  30 mg Oral Daily  . levothyroxine  125 mcg Oral QAC breakfast  . multivitamin with minerals  1 tablet Oral QHS  . pantoprazole  40 mg Oral QHS  . sodium chloride flush  3 mL Intravenous Q12H  . tiotropium  18 mcg  Inhalation Daily    Continuous Infusions: . sodium chloride 50 mL/hr at 12/31/16 0300  . sodium chloride    . heparin 1,100 Units/hr (12/31/16 0900)    PRN Meds: sodium chloride, acetaminophen, HYDROmorphone (DILAUDID) injection, nitroGLYCERIN, ondansetron (ZOFRAN) IV, sodium chloride, sodium chloride flush   Labs   Results for orders placed or performed during the hospital encounter of 12/29/16 (from the past 48 hour(s))  Basic metabolic panel     Status: Abnormal   Collection Time: 12/29/16  4:06 PM  Result Value Ref Range   Sodium 137 135 - 145 mmol/L   Potassium 4.6 3.5 - 5.1 mmol/L   Chloride 108 101 - 111 mmol/L   CO2 20 (L) 22 - 32 mmol/L   Glucose, Bld 115 (H) 65 - 99 mg/dL   BUN 35 (H) 6 - 20 mg/dL   Creatinine, Ser 2.11 (H) 0.61 - 1.24 mg/dL   Calcium 9.4 8.9 - 10.3 mg/dL   GFR calc non Af Amer 28 (L) >60 mL/min   GFR calc Af Amer 32 (L) >60  mL/min    Comment: (NOTE) The eGFR has been calculated using the CKD EPI equation. This calculation has not been validated in all clinical situations. eGFR's persistently <60 mL/min signify possible Chronic Kidney Disease.    Anion gap 9 5 - 15  CBC     Status: Abnormal   Collection Time: 12/29/16  4:06 PM  Result Value Ref Range   WBC 6.8 4.0 - 10.5 K/uL   RBC 4.17 (L) 4.22 - 5.81 MIL/uL   Hemoglobin 13.1 13.0 - 17.0 g/dL   HCT 38.7 (L) 39.0 - 52.0 %   MCV 92.8 78.0 - 100.0 fL   MCH 31.4 26.0 - 34.0 pg   MCHC 33.9 30.0 - 36.0 g/dL   RDW 14.1 11.5 - 15.5 %   Platelets 217 150 - 400 K/uL  I-stat troponin, ED     Status: None   Collection Time: 12/29/16  4:16 PM  Result Value Ref Range   Troponin i, poc 0.02 0.00 - 0.08 ng/mL   Comment 3            Comment: Due to the release kinetics of cTnI, a negative result within the first hours of the onset of symptoms does not rule out myocardial infarction with certainty. If myocardial infarction is still suspected, repeat the test at appropriate intervals.   I-stat  troponin, ED     Status: None   Collection Time: 12/29/16  7:48 PM  Result Value Ref Range   Troponin i, poc 0.05 0.00 - 0.08 ng/mL   Comment 3            Comment: Due to the release kinetics of cTnI, a negative result within the first hours of the onset of symptoms does not rule out myocardial infarction with certainty. If myocardial infarction is still suspected, repeat the test at appropriate intervals.   Troponin I     Status: Abnormal   Collection Time: 12/29/16  9:15 PM  Result Value Ref Range   Troponin I 0.06 (HH) <0.03 ng/mL    Comment: CRITICAL RESULT CALLED TO, READ BACK BY AND VERIFIED WITH: J.THOMAS,RN 2200 12/29/16 G.MCADOO   Troponin I     Status: Abnormal   Collection Time: 12/30/16  2:19 AM  Result Value Ref Range   Troponin I 0.52 (HH) <0.03 ng/mL    Comment: CRITICAL RESULT CALLED TO, READ BACK BY AND VERIFIED WITH: J.THOMAS,RN 0320 12/30/16 G.MCADOO   Lipid panel     Status: Abnormal   Collection Time: 12/30/16  2:19 AM  Result Value Ref Range   Cholesterol 151 0 - 200 mg/dL   Triglycerides 164 (H) <150 mg/dL   HDL 36 (L) >40 mg/dL   Total CHOL/HDL Ratio 4.2 RATIO   VLDL 33 0 - 40 mg/dL   LDL Cholesterol 82 0 - 99 mg/dL    Comment:        Total Cholesterol/HDL:CHD Risk Coronary Heart Disease Risk Table                     Men   Women  1/2 Average Risk   3.4   3.3  Average Risk       5.0   4.4  2 X Average Risk   9.6   7.1  3 X Average Risk  23.4   11.0        Use the calculated Patient Ratio above and the CHD Risk Table to determine the patient's CHD Risk.  ATP III CLASSIFICATION (LDL):  <100     mg/dL   Optimal  100-129  mg/dL   Near or Above                    Optimal  130-159  mg/dL   Borderline  160-189  mg/dL   High  >190     mg/dL   Very High   CBC     Status: Abnormal   Collection Time: 12/30/16  9:34 AM  Result Value Ref Range   WBC 4.7 4.0 - 10.5 K/uL   RBC 4.11 (L) 4.22 - 5.81 MIL/uL   Hemoglobin 12.7 (L) 13.0 - 17.0 g/dL     HCT 38.2 (L) 39.0 - 52.0 %   MCV 92.9 78.0 - 100.0 fL   MCH 30.9 26.0 - 34.0 pg   MCHC 33.2 30.0 - 36.0 g/dL   RDW 14.1 11.5 - 15.5 %   Platelets 186 150 - 400 K/uL  Heparin level (unfractionated)     Status: None   Collection Time: 12/30/16  9:34 AM  Result Value Ref Range   Heparin Unfractionated 0.38 0.30 - 0.70 IU/mL    Comment:        IF HEPARIN RESULTS ARE BELOW EXPECTED VALUES, AND PATIENT DOSAGE HAS BEEN CONFIRMED, SUGGEST FOLLOW UP TESTING OF ANTITHROMBIN III LEVELS.   Troponin I     Status: Abnormal   Collection Time: 12/30/16  9:34 AM  Result Value Ref Range   Troponin I 0.69 (HH) <0.03 ng/mL    Comment: CRITICAL VALUE NOTED.  VALUE IS CONSISTENT WITH PREVIOUSLY REPORTED AND CALLED VALUE.  Basic metabolic panel     Status: Abnormal   Collection Time: 12/30/16  9:34 AM  Result Value Ref Range   Sodium 140 135 - 145 mmol/L   Potassium 4.7 3.5 - 5.1 mmol/L   Chloride 110 101 - 111 mmol/L   CO2 22 22 - 32 mmol/L   Glucose, Bld 106 (H) 65 - 99 mg/dL   BUN 28 (H) 6 - 20 mg/dL   Creatinine, Ser 1.84 (H) 0.61 - 1.24 mg/dL   Calcium 9.3 8.9 - 10.3 mg/dL   GFR calc non Af Amer 33 (L) >60 mL/min   GFR calc Af Amer 38 (L) >60 mL/min    Comment: (NOTE) The eGFR has been calculated using the CKD EPI equation. This calculation has not been validated in all clinical situations. eGFR's persistently <60 mL/min signify possible Chronic Kidney Disease.    Anion gap 8 5 - 15  Protime-INR     Status: None   Collection Time: 12/30/16  1:42 PM  Result Value Ref Range   Prothrombin Time 13.7 11.4 - 15.2 seconds   INR 1.05   MRSA PCR Screening     Status: None   Collection Time: 12/30/16  6:09 PM  Result Value Ref Range   MRSA by PCR NEGATIVE NEGATIVE    Comment:        The GeneXpert MRSA Assay (FDA approved for NASAL specimens only), is one component of a comprehensive MRSA colonization surveillance program. It is not intended to diagnose MRSA infection nor to guide  or monitor treatment for MRSA infections.   Troponin I     Status: Abnormal   Collection Time: 12/30/16 11:01 PM  Result Value Ref Range   Troponin I 0.16 (HH) <0.03 ng/mL    Comment: CRITICAL VALUE NOTED.  VALUE IS CONSISTENT WITH PREVIOUSLY REPORTED AND CALLED VALUE.  Troponin I  Status: Abnormal   Collection Time: 12/31/16  7:28 AM  Result Value Ref Range   Troponin I 0.94 (HH) <0.03 ng/mL    Comment: REPEATED TO VERIFY CRITICAL VALUE NOTED.  VALUE IS CONSISTENT WITH PREVIOUSLY REPORTED AND CALLED VALUE.   Heparin level (unfractionated)     Status: None   Collection Time: 12/31/16  7:28 AM  Result Value Ref Range   Heparin Unfractionated 0.40 0.30 - 0.70 IU/mL    Comment:        IF HEPARIN RESULTS ARE BELOW EXPECTED VALUES, AND PATIENT DOSAGE HAS BEEN CONFIRMED, SUGGEST FOLLOW UP TESTING OF ANTITHROMBIN III LEVELS.   Basic metabolic panel     Status: Abnormal   Collection Time: 12/31/16  7:28 AM  Result Value Ref Range   Sodium 138 135 - 145 mmol/L   Potassium 5.1 3.5 - 5.1 mmol/L   Chloride 109 101 - 111 mmol/L   CO2 21 (L) 22 - 32 mmol/L   Glucose, Bld 107 (H) 65 - 99 mg/dL   BUN 19 6 - 20 mg/dL   Creatinine, Ser 1.62 (H) 0.61 - 1.24 mg/dL   Calcium 9.1 8.9 - 10.3 mg/dL   GFR calc non Af Amer 38 (L) >60 mL/min   GFR calc Af Amer 45 (L) >60 mL/min    Comment: (NOTE) The eGFR has been calculated using the CKD EPI equation. This calculation has not been validated in all clinical situations. eGFR's persistently <60 mL/min signify possible Chronic Kidney Disease.    Anion gap 8 5 - 15  CBC     Status: Abnormal   Collection Time: 12/31/16  7:32 AM  Result Value Ref Range   WBC 8.1 4.0 - 10.5 K/uL   RBC 4.05 (L) 4.22 - 5.81 MIL/uL   Hemoglobin 12.6 (L) 13.0 - 17.0 g/dL   HCT 37.8 (L) 39.0 - 52.0 %   MCV 93.3 78.0 - 100.0 fL   MCH 31.1 26.0 - 34.0 pg   MCHC 33.3 30.0 - 36.0 g/dL   RDW 14.3 11.5 - 15.5 %   Platelets 179 150 - 400 K/uL    ECG   Sinus brady  at 58, RBBB, lateral T wave changes - Personally Reviewed  Telemetry   Sinus brady - Personally Reviewed  Radiology    Dg Chest 2 View  Result Date: 12/29/2016 CLINICAL DATA:  Chest pain. EXAM: CHEST  2 VIEW COMPARISON:  Chest x-ray dated December 19, 2016. FINDINGS: Prior CABG. The cardiomediastinal silhouette is normal in size. Normal pulmonary vascularity. Atherosclerotic calcification of the aortic arch. Bibasilar atelectasis. No focal consolidation, pleural effusion, or pneumothorax. No acute osseous abnormality. IMPRESSION: No active cardiopulmonary disease. Electronically Signed   By: Titus Dubin M.D.   On: 12/29/2016 16:33    Cardiac Studies   Procedures   LEFT HEART CATH AND CORS/GRAFTS ANGIOGRAPHY  Conclusion     Prox RCA to Dist RCA lesion, 100 %stenosed.  Ost LAD to Prox LAD lesion, 100 %stenosed. LIMA to LAD is patent.  Ost Ramus to Ramus lesion, 30 %stenosed.  Prox Cx to Mid Cx lesion, 100 %stenosed. SVG to OM is severely diseased proximally and distally. There is improvement since the prior film in the distal graft.  SVG to diagonal is patent with 75% stenosis at the anastamosis.  1st Diag lesion, 50 %stenosed.  Proximal part of SVG graft to PDA/PLA is patent. Unable to visualize entire graft.  LV end diastolic pressure is low.  There is no aortic valve stenosis.  Plan to bring the patient back for PCI of the SVG to the diagonal and relook at the SVG to RCA graft.    Patient had chest pain after injecting of the SVG to OM, which has been heavily diseased for several years.  Pain decreased after the procedure.  WIll cycle troponins to see if there has been any change.  I do not think this graft is amenable to PCI due to the extensive disease.    Will discuss with Dr. Claiborne Billings.    Discussed with Dr. Debara Pickett.  Troponin leak is likely related to ischemia from the chronically diseased SVG to OM.      Assessment   1. Principal Problem: 2.   Unstable  angina ([place]) 3. Active Problems: 4.   Hyperlipidemia LDL goal <70 5.   Coronary atherosclerosis, CABG 2004 6.   ATRIAL FLUTTER, CHRONIC 7.   CKD (chronic kidney disease), stage III 8.   Acute on chronic renal failure (HCC) 9.   Plan   1. Creatinine will support intervention attempt today. Patient is agreeable to this. Will also need intensification of medical therapy. Keep NPO for PCI today with Dr. Claiborne Billings.  Time Spent Directly with Patient:  I have spent a total of 15 minutes with the patient reviewing hospital notes, telemetry, EKGs, labs and examining the patient as well as establishing an assessment and plan that was discussed personally with the patient. > 50% of time was spent in direct patient care.  Length of Stay:  LOS: 2 days   Pixie Casino, MD, [place]  Attending Cardiologist  Direct Dial: [phone]  Fax: [phone]  Website:  [email or web address] 12/31/2016, 9:54 AM

## 2016-12-31 NOTE — Interval H&P Note (Signed)
Cath Lab Visit (complete for each Cath Lab visit)  Clinical Evaluation Leading to the Procedure:   ACS: Yes.    Non-ACS:    Anginal Classification: CCS IV  Anti-ischemic medical therapy: Minimal Therapy (1 class of medications)  Non-Invasive Test Results: No non-invasive testing performed  Prior CABG: Previous CABG      History and Physical Interval Note:  12/31/2016 2:08 PM  Omar Benson  has presented today for surgery, with the diagnosis of CAD  The various methods of treatment have been discussed with the patient and family. After consideration of risks, benefits and other options for treatment, the patient has consented to  Procedure(s): CORONARY STENT INTERVENTION (N/A) as a surgical intervention .  The patient's history has been reviewed, patient examined, no change in status, stable for surgery.  I have reviewed the patient's chart and labs.  Questions were answered to the patient's satisfaction.     Shelva Majestic

## 2016-12-31 NOTE — Progress Notes (Signed)
ANTICOAGULATION CONSULT NOTE - Follow up Pharmacy Consult for heparin  Indication: chest pain/ACS s/p cath  Allergies  Allergen Reactions  . Fish Allergy Diarrhea and Nausea And Vomiting  . Promethazine Hcl Other (See Comments)    Syncope (reaction to phenergan)  . Shellfish Allergy Diarrhea and Nausea And Vomiting  . Yellow Jacket Venom [Bee Venom] Swelling    Localized swelling from yellow jackets and honey bees  . Doxycycline Hyclate Other (See Comments)    esophagitis  . Lipitor [Atorvastatin Calcium] Other (See Comments)    Myalgia/myopathy  . Tetracycline Other (See Comments)    Esophagitis  . Crestor [Rosuvastatin] Other (See Comments)    Myalgia/myopathy  . Tetanus Toxoid Swelling and Rash    Labs:  Recent Labs  12/29/16 1606  12/30/16 0219 12/30/16 0934 12/30/16 1342 12/30/16 2301 12/31/16 0728 12/31/16 0732  HGB 13.1  --   --  12.7*  --   --   --  12.6*  HCT 38.7*  --   --  38.2*  --   --   --  37.8*  PLT 217  --   --  186  --   --   --  179  LABPROT  --   --   --   --  13.7  --   --   --   INR  --   --   --   --  1.05  --   --   --   HEPARINUNFRC  --   --   --  0.38  --   --  0.40  --   CREATININE 2.11*  --   --  1.84*  --   --   --   --   TROPONINI  --   < > 0.52* 0.69*  --  0.16*  --   --   < > = values in this interval not displayed.  Estimated Creatinine Clearance: 32 mL/min (A) (by C-G formula based on SCr of 1.84 mg/dL (H)).   Assessment: 44 yoM presented to the hospital with CP now s/p cath with plans for staged PCI 8/16. Pt remains on heparin infusion for now, heparin level currently therapeutic at 0.40, CBC stable, no S/Sx bleeding noted.  Goal of Therapy:  Heparin level 0.3-0.7 units/ml Monitor platelets by anticoagulation protocol: Yes   Plan:  -Continue heparin at 1100 units/hr -Monitor daily heparin level, CBC if continued on heparin -Follow-up post-cath  Arrie Senate, PharmD PGY-2 Cardiology Pharmacy Resident Pager:  [phone] 12/31/2016

## 2016-12-31 NOTE — Progress Notes (Signed)
DAILY PROGRESS NOTE   Patient Name: Omar Benson Date of Encounter: 12/31/2016  Hospital Problem List   Principal Problem:   Unstable angina [facility]) Active Problems:   Hyperlipidemia LDL goal <70   Coronary atherosclerosis, CABG 2004   ATRIAL FLUTTER, CHRONIC   CKD (chronic kidney disease), stage III   Acute on chronic renal failure [facility])    Chief Complaint   No chest pain  Subjective   Diagnostic cath yesterday - possible target of PCI at the SVG to diagonal anastamosis. There is severe diffuse disease to the SVG to OM - high risk for no-reflow. Creatinine further improved today with hydration.  Objective   Vitals:   12/31/16 0400 12/31/16 0407 12/31/16 0500 12/31/16 0600  BP: 115/70  122/73 111/65  Pulse: (!) 59  60 63  Resp: 19  16 (!) 22  Temp:  97.9 F (36.6 C)    TempSrc:  Oral    SpO2: 96%  97% 98%  Weight:   184 lb 4.9 oz (83.6 kg)   Height:        Intake/Output Summary (Last 24 hours) at 12/31/16 0954 Last data filed at 12/31/16 0900  Gross per 24 hour  Intake          1016.82 ml  Output             1300 ml  Net          -283.18 ml   Filed Weights   12/29/16 2141 12/30/16 0624 12/31/16 0500  Weight: 182 lb 1.6 oz (82.6 kg) 181 lb 3.2 oz (82.2 kg) 184 lb 4.9 oz (83.6 kg)    Physical Exam   General appearance: alert and no distress Lungs: clear to auscultation bilaterally Heart: regular rate and rhythm, S1, S2 normal, no murmur, click, rub or gallop Extremities: extremities normal, atraumatic, no cyanosis or edema Neurologic: Grossly normal  Inpatient Medications    Scheduled Meds: . allopurinol  300 mg Oral QHS  . amiodarone  100 mg Oral QHS  . aspirin EC  81 mg Oral QHS  . hydrALAZINE  75 mg Oral BID  . isosorbide mononitrate  30 mg Oral Daily  . levothyroxine  125 mcg Oral QAC breakfast  . multivitamin with minerals  1 tablet Oral QHS  . pantoprazole  40 mg Oral QHS  . sodium chloride flush  3 mL Intravenous Q12H  . tiotropium  18 mcg  Inhalation Daily    Continuous Infusions: . sodium chloride 50 mL/hr at 12/31/16 0300  . sodium chloride    . heparin 1,100 Units/hr (12/31/16 0900)    PRN Meds: sodium chloride, acetaminophen, HYDROmorphone (DILAUDID) injection, nitroGLYCERIN, ondansetron (ZOFRAN) IV, sodium chloride, sodium chloride flush   Labs   Results for orders placed or performed during the hospital encounter of 12/29/16 (from the past 48 hour(s))  Basic metabolic panel     Status: Abnormal   Collection Time: 12/29/16  4:06 PM  Result Value Ref Range   Sodium 137 135 - 145 mmol/L   Potassium 4.6 3.5 - 5.1 mmol/L   Chloride 108 101 - 111 mmol/L   CO2 20 (L) 22 - 32 mmol/L   Glucose, Bld 115 (H) 65 - 99 mg/dL   BUN 35 (H) 6 - 20 mg/dL   Creatinine, Ser 2.11 (H) 0.61 - 1.24 mg/dL   Calcium 9.4 8.9 - 10.3 mg/dL   GFR calc non Af Amer 28 (L) >60 mL/min   GFR calc Af Amer 32 (L) >60  mL/min    Comment: (NOTE) The eGFR has been calculated using the CKD EPI equation. This calculation has not been validated in all clinical situations. eGFR's persistently <60 mL/min signify possible Chronic Kidney Disease.    Anion gap 9 5 - 15  CBC     Status: Abnormal   Collection Time: 12/29/16  4:06 PM  Result Value Ref Range   WBC 6.8 4.0 - 10.5 K/uL   RBC 4.17 (L) 4.22 - 5.81 MIL/uL   Hemoglobin 13.1 13.0 - 17.0 g/dL   HCT 38.7 (L) 39.0 - 52.0 %   MCV 92.8 78.0 - 100.0 fL   MCH 31.4 26.0 - 34.0 pg   MCHC 33.9 30.0 - 36.0 g/dL   RDW 14.1 11.5 - 15.5 %   Platelets 217 150 - 400 K/uL  I-stat troponin, ED     Status: None   Collection Time: 12/29/16  4:16 PM  Result Value Ref Range   Troponin i, poc 0.02 0.00 - 0.08 ng/mL   Comment 3            Comment: Due to the release kinetics of cTnI, a negative result within the first hours of the onset of symptoms does not rule out myocardial infarction with certainty. If myocardial infarction is still suspected, repeat the test at appropriate intervals.   I-stat  troponin, ED     Status: None   Collection Time: 12/29/16  7:48 PM  Result Value Ref Range   Troponin i, poc 0.05 0.00 - 0.08 ng/mL   Comment 3            Comment: Due to the release kinetics of cTnI, a negative result within the first hours of the onset of symptoms does not rule out myocardial infarction with certainty. If myocardial infarction is still suspected, repeat the test at appropriate intervals.   Troponin I     Status: Abnormal   Collection Time: 12/29/16  9:15 PM  Result Value Ref Range   Troponin I 0.06 (HH) <0.03 ng/mL    Comment: CRITICAL RESULT CALLED TO, READ BACK BY AND VERIFIED WITH: J.THOMAS,RN 2200 12/29/16 G.MCADOO   Troponin I     Status: Abnormal   Collection Time: 12/30/16  2:19 AM  Result Value Ref Range   Troponin I 0.52 (HH) <0.03 ng/mL    Comment: CRITICAL RESULT CALLED TO, READ BACK BY AND VERIFIED WITH: J.THOMAS,RN 0320 12/30/16 G.MCADOO   Lipid panel     Status: Abnormal   Collection Time: 12/30/16  2:19 AM  Result Value Ref Range   Cholesterol 151 0 - 200 mg/dL   Triglycerides 164 (H) <150 mg/dL   HDL 36 (L) >40 mg/dL   Total CHOL/HDL Ratio 4.2 RATIO   VLDL 33 0 - 40 mg/dL   LDL Cholesterol 82 0 - 99 mg/dL    Comment:        Total Cholesterol/HDL:CHD Risk Coronary Heart Disease Risk Table                     Men   Women  1/2 Average Risk   3.4   3.3  Average Risk       5.0   4.4  2 X Average Risk   9.6   7.1  3 X Average Risk  23.4   11.0        Use the calculated Patient Ratio above and the CHD Risk Table to determine the patient's CHD Risk.  ATP III CLASSIFICATION (LDL):  <100     mg/dL   Optimal  100-129  mg/dL   Near or Above                    Optimal  130-159  mg/dL   Borderline  160-189  mg/dL   High  >190     mg/dL   Very High   CBC     Status: Abnormal   Collection Time: 12/30/16  9:34 AM  Result Value Ref Range   WBC 4.7 4.0 - 10.5 K/uL   RBC 4.11 (L) 4.22 - 5.81 MIL/uL   Hemoglobin 12.7 (L) 13.0 - 17.0 g/dL     HCT 38.2 (L) 39.0 - 52.0 %   MCV 92.9 78.0 - 100.0 fL   MCH 30.9 26.0 - 34.0 pg   MCHC 33.2 30.0 - 36.0 g/dL   RDW 14.1 11.5 - 15.5 %   Platelets 186 150 - 400 K/uL  Heparin level (unfractionated)     Status: None   Collection Time: 12/30/16  9:34 AM  Result Value Ref Range   Heparin Unfractionated 0.38 0.30 - 0.70 IU/mL    Comment:        IF HEPARIN RESULTS ARE BELOW EXPECTED VALUES, AND PATIENT DOSAGE HAS BEEN CONFIRMED, SUGGEST FOLLOW UP TESTING OF ANTITHROMBIN III LEVELS.   Troponin I     Status: Abnormal   Collection Time: 12/30/16  9:34 AM  Result Value Ref Range   Troponin I 0.69 (HH) <0.03 ng/mL    Comment: CRITICAL VALUE NOTED.  VALUE IS CONSISTENT WITH PREVIOUSLY REPORTED AND CALLED VALUE.  Basic metabolic panel     Status: Abnormal   Collection Time: 12/30/16  9:34 AM  Result Value Ref Range   Sodium 140 135 - 145 mmol/L   Potassium 4.7 3.5 - 5.1 mmol/L   Chloride 110 101 - 111 mmol/L   CO2 22 22 - 32 mmol/L   Glucose, Bld 106 (H) 65 - 99 mg/dL   BUN 28 (H) 6 - 20 mg/dL   Creatinine, Ser 1.84 (H) 0.61 - 1.24 mg/dL   Calcium 9.3 8.9 - 10.3 mg/dL   GFR calc non Af Amer 33 (L) >60 mL/min   GFR calc Af Amer 38 (L) >60 mL/min    Comment: (NOTE) The eGFR has been calculated using the CKD EPI equation. This calculation has not been validated in all clinical situations. eGFR's persistently <60 mL/min signify possible Chronic Kidney Disease.    Anion gap 8 5 - 15  Protime-INR     Status: None   Collection Time: 12/30/16  1:42 PM  Result Value Ref Range   Prothrombin Time 13.7 11.4 - 15.2 seconds   INR 1.05   MRSA PCR Screening     Status: None   Collection Time: 12/30/16  6:09 PM  Result Value Ref Range   MRSA by PCR NEGATIVE NEGATIVE    Comment:        The GeneXpert MRSA Assay (FDA approved for NASAL specimens only), is one component of a comprehensive MRSA colonization surveillance program. It is not intended to diagnose MRSA infection nor to guide  or monitor treatment for MRSA infections.   Troponin I     Status: Abnormal   Collection Time: 12/30/16 11:01 PM  Result Value Ref Range   Troponin I 0.16 (HH) <0.03 ng/mL    Comment: CRITICAL VALUE NOTED.  VALUE IS CONSISTENT WITH PREVIOUSLY REPORTED AND CALLED VALUE.  Troponin I  Status: Abnormal   Collection Time: 12/31/16  7:28 AM  Result Value Ref Range   Troponin I 0.94 (HH) <0.03 ng/mL    Comment: REPEATED TO VERIFY CRITICAL VALUE NOTED.  VALUE IS CONSISTENT WITH PREVIOUSLY REPORTED AND CALLED VALUE.   Heparin level (unfractionated)     Status: None   Collection Time: 12/31/16  7:28 AM  Result Value Ref Range   Heparin Unfractionated 0.40 0.30 - 0.70 IU/mL    Comment:        IF HEPARIN RESULTS ARE BELOW EXPECTED VALUES, AND PATIENT DOSAGE HAS BEEN CONFIRMED, SUGGEST FOLLOW UP TESTING OF ANTITHROMBIN III LEVELS.   Basic metabolic panel     Status: Abnormal   Collection Time: 12/31/16  7:28 AM  Result Value Ref Range   Sodium 138 135 - 145 mmol/L   Potassium 5.1 3.5 - 5.1 mmol/L   Chloride 109 101 - 111 mmol/L   CO2 21 (L) 22 - 32 mmol/L   Glucose, Bld 107 (H) 65 - 99 mg/dL   BUN 19 6 - 20 mg/dL   Creatinine, Ser 1.62 (H) 0.61 - 1.24 mg/dL   Calcium 9.1 8.9 - 10.3 mg/dL   GFR calc non Af Amer 38 (L) >60 mL/min   GFR calc Af Amer 45 (L) >60 mL/min    Comment: (NOTE) The eGFR has been calculated using the CKD EPI equation. This calculation has not been validated in all clinical situations. eGFR's persistently <60 mL/min signify possible Chronic Kidney Disease.    Anion gap 8 5 - 15  CBC     Status: Abnormal   Collection Time: 12/31/16  7:32 AM  Result Value Ref Range   WBC 8.1 4.0 - 10.5 K/uL   RBC 4.05 (L) 4.22 - 5.81 MIL/uL   Hemoglobin 12.6 (L) 13.0 - 17.0 g/dL   HCT 37.8 (L) 39.0 - 52.0 %   MCV 93.3 78.0 - 100.0 fL   MCH 31.1 26.0 - 34.0 pg   MCHC 33.3 30.0 - 36.0 g/dL   RDW 14.3 11.5 - 15.5 %   Platelets 179 150 - 400 K/uL    ECG   Sinus brady  at 58, RBBB, lateral T wave changes - Personally Reviewed  Telemetry   Sinus brady - Personally Reviewed  Radiology    Dg Chest 2 View  Result Date: 12/29/2016 CLINICAL DATA:  Chest pain. EXAM: CHEST  2 VIEW COMPARISON:  Chest x-ray dated December 19, 2016. FINDINGS: Prior CABG. The cardiomediastinal silhouette is normal in size. Normal pulmonary vascularity. Atherosclerotic calcification of the aortic arch. Bibasilar atelectasis. No focal consolidation, pleural effusion, or pneumothorax. No acute osseous abnormality. IMPRESSION: No active cardiopulmonary disease. Electronically Signed   By: Titus Dubin M.D.   On: 12/29/2016 16:33    Cardiac Studies   Procedures   LEFT HEART CATH AND CORS/GRAFTS ANGIOGRAPHY  Conclusion     Prox RCA to Dist RCA lesion, 100 %stenosed.  Ost LAD to Prox LAD lesion, 100 %stenosed. LIMA to LAD is patent.  Ost Ramus to Ramus lesion, 30 %stenosed.  Prox Cx to Mid Cx lesion, 100 %stenosed. SVG to OM is severely diseased proximally and distally. There is improvement since the prior film in the distal graft.  SVG to diagonal is patent with 75% stenosis at the anastamosis.  1st Diag lesion, 50 %stenosed.  Proximal part of SVG graft to PDA/PLA is patent. Unable to visualize entire graft.  LV end diastolic pressure is low.  There is no aortic valve stenosis.  Plan to bring the patient back for PCI of the SVG to the diagonal and relook at the SVG to RCA graft.    Patient had chest pain after injecting of the SVG to OM, which has been heavily diseased for several years.  Pain decreased after the procedure.  WIll cycle troponins to see if there has been any change.  I do not think this graft is amenable to PCI due to the extensive disease.    Will discuss with Dr. Claiborne Billings.    Discussed with Dr. Debara Pickett.  Troponin leak is likely related to ischemia from the chronically diseased SVG to OM.      Assessment   1. Principal Problem: 2.   Unstable  angina ([place]) 3. Active Problems: 4.   Hyperlipidemia LDL goal <70 5.   Coronary atherosclerosis, CABG 2004 6.   ATRIAL FLUTTER, CHRONIC 7.   CKD (chronic kidney disease), stage III 8.   Acute on chronic renal failure (HCC) 9.   Plan   1. Creatinine will support intervention attempt today. Patient is agreeable to this. Will also need intensification of medical therapy. Keep NPO for PCI today with Dr. Claiborne Billings.  Time Spent Directly with Patient:  I have spent a total of 15 minutes with the patient reviewing hospital notes, telemetry, EKGs, labs and examining the patient as well as establishing an assessment and plan that was discussed personally with the patient. > 50% of time was spent in direct patient care.  Length of Stay:  LOS: 2 days   Pixie Casino, MD, [place]  Attending Cardiologist  Direct Dial: [phone]  Fax: [phone]  Website:  [email or web address] [email or web address] [email or web address] Liptak 12/31/2016, 9:54 AM

## 2017-01-01 ENCOUNTER — Encounter (HOSPITAL_COMMUNITY): Payer: Self-pay | Admitting: Cardiovascular Disease

## 2017-01-01 LAB — CBC
HCT: 33.5 % — ABNORMAL LOW (ref 39.0–52.0)
Hemoglobin: 11.2 g/dL — ABNORMAL LOW (ref 13.0–17.0)
MCH: 31.2 pg (ref 26.0–34.0)
MCHC: 33.4 g/dL (ref 30.0–36.0)
MCV: 93.3 fL (ref 78.0–100.0)
PLATELETS: 183 10*3/uL (ref 150–400)
RBC: 3.59 MIL/uL — ABNORMAL LOW (ref 4.22–5.81)
RDW: 14.3 % (ref 11.5–15.5)
WBC: 5.5 10*3/uL (ref 4.0–10.5)

## 2017-01-01 LAB — BASIC METABOLIC PANEL
ANION GAP: 5 (ref 5–15)
BUN: 13 mg/dL (ref 6–20)
CALCIUM: 8.6 mg/dL — AB (ref 8.9–10.3)
CO2: 24 mmol/L (ref 22–32)
Chloride: 108 mmol/L (ref 101–111)
Creatinine, Ser: 1.57 mg/dL — ABNORMAL HIGH (ref 0.61–1.24)
GFR, EST AFRICAN AMERICAN: 46 mL/min — AB (ref >60)
GFR, EST NON AFRICAN AMERICAN: 40 mL/min — AB (ref >60)
GLUCOSE: 95 mg/dL (ref 65–99)
Potassium: 4.2 mmol/L (ref 3.5–5.1)
Sodium: 137 mmol/L (ref 135–145)

## 2017-01-01 MED ORDER — EZETIMIBE 10 MG PO TABS: 10.0000 mg | ORAL_TABLET | Freq: Every day | ORAL | Status: DC

## 2017-01-01 MED ORDER — NITROGLYCERIN 0.3 MG SL SUBL: 0.3000 mg | SUBLINGUAL_TABLET | SUBLINGUAL | 11 refills | Status: DC | PRN

## 2017-01-01 MED ORDER — OFF THE BEAT BOOK
Freq: Once | Status: DC
  Filled 2017-01-01: qty 1

## 2017-01-01 MED ORDER — EZETIMIBE 10 MG PO TABS: 10.0000 mg | ORAL_TABLET | Freq: Every day | ORAL | 11 refills | Status: DC

## 2017-01-01 MED ORDER — ASPIRIN 81 MG PO TABS: 81.0000 mg | ORAL_TABLET | Freq: Every day | ORAL | 11 refills | Status: AC

## 2017-01-01 MED ORDER — ANGIOPLASTY BOOK
Freq: Once | Status: DC
  Filled 2017-01-01: qty 1

## 2017-01-01 MED ORDER — ISOSORBIDE MONONITRATE ER 30 MG PO TB24: 30.0000 mg | ORAL_TABLET | Freq: Every day | ORAL | 6 refills | Status: DC

## 2017-01-01 MED ORDER — TICAGRELOR 90 MG PO TABS: 90.0000 mg | ORAL_TABLET | Freq: Two times a day (BID) | ORAL | 3 refills | Status: DC

## 2017-01-01 MED ORDER — TICAGRELOR 90 MG PO TABS: 90.0000 mg | ORAL_TABLET | Freq: Two times a day (BID) | ORAL | 0 refills | Status: DC

## 2017-01-01 MED ORDER — HYDRALAZINE HCL 50 MG PO TABS: 75.0000 mg | ORAL_TABLET | Freq: Two times a day (BID) | ORAL | Status: DC

## 2017-01-01 NOTE — Progress Notes (Signed)
CARDIAC REHAB PHASE I   PRE:  Rate/Rhythm: 60 SR  BP:  Sitting: 127/54        SaO2: 98 RA  MODE:  Ambulation: 800 ft   POST:  Rate/Rhythm: 76 SR  BP:  Sitting: 140/60         SaO2: 95 RA  Pt ambulated 800 ft on RA, handheld assist, steady gait, tolerated well with no complaints. Completed MI/stent education with pt and wife at bedside (pt last seen by cardiac rehab 8/6).  Reviewed risk factors, anti-platelet therapy, stent card, activity restrictions, ntg, exercise, heart healthy diet and phase 2 cardiac rehab. Pt and wife verbalized understanding. Pt agrees to phase 2 cardiac rehab referral, (referal sent last week), will send updated referral to [facility]. Pt to edge of bed after walk, call bell within reach.     [id number] Lenna Sciara, RN, BSN 01/01/2017 8:58 AM

## 2017-01-01 NOTE — Discharge Summary (Signed)
Discharge Summary    Patient ID: Omar Benson,  MRN: [id number], DOB/AGE: Mar 26, 1935 82 y.o.  Admit date: 12/29/2016 Discharge date: 01/01/2017  Primary Care Provider: Biagio Borg Primary Cardiologist (EP): Dr. Lovena Le  Primary Cardiologist: Dr. Debara Pickett  Discharge Diagnoses    Principal Problem:   Non-ST elevation (NSTEMI) myocardial infarction [facility]) Active Problems:   Hyperlipidemia LDL goal <70   Coronary atherosclerosis, a CABG (2004) b. PCI with DES to RCA through SVG and PCI with DES to SVG   ATRIAL FLUTTER, CHRONIC   CKD (chronic kidney disease), stage III   Unstable angina (HCC)   Acute on chronic renal failure (HCC)   Allergies Allergies  Allergen Reactions  . Fish Allergy Diarrhea and Nausea And Vomiting  . Promethazine Hcl Other (See Comments)    Syncope (reaction to phenergan)  . Shellfish Allergy Diarrhea and Nausea And Vomiting  . Yellow Jacket Venom [Bee Venom] Swelling    Localized swelling from yellow jackets and honey bees  . Doxycycline Hyclate Other (See Comments)    esophagitis  . Lipitor [Atorvastatin Calcium] Other (See Comments)    Myalgia/myopathy  . Tetracycline Other (See Comments)    Esophagitis  . Crestor [Rosuvastatin] Other (See Comments)    Myalgia/myopathy  . Tetanus Toxoid Swelling and Rash     History of Present Illness     Omar Benson is a patient of Dr. Loel Lofty with a past medical history that is significant for coronary artery disease s/p CABG x 4 and PCI, atrial flutter s/p ablation, chronic atrial flutter, hypertension, COPD, anxiety, and recurrent angina as well as stage III chronic kidney disease. He had a recent admission earlier this month December 23, 2016 for chest pain and was diagnosed with an NSTEMI. His troponin peaked at 0.98. An echocardiogram was performed showing a normal LV EF of 55-60% with no wall motion abnormalities. At this time he was started on Imdur, he refused Hydralazine intermittently for elevated BP.  Prior to this admission his most recent cardiac catheterization was in February of 2017 showing severe three vessel native occlusive disease, a patent LIMA to LAD, patent SVG to first diagonal, patent SVG RCA and severely diseased SVG to the OM (stable compared to cath in 2013- not amenable to PCI). He was bradycardic and it was felt he may eventually need a pacemaker. He has persistent LDL-C of 99 and has been statin intolerant. Triglycerides in the 200s.   This presentation he was having acute, substernal central, nonradiating, chest pain that occurred with exertiion. He felt like it was similar to his prior anginal pain. He had missed a few doses of his Imdur and did obtain some relief with nitroglycerin. His EKG showed RBBB, SR at 73 without acute ischemic changes. Initial troponin 0.02, sCr 2.11, which was elevated from his most recent admission of 1.79.  Hospital Course     Consultants: None  He was started on heparin. Initially a cardiac catheterization was desired but withheld because of his acute kidney injury but with hydration his renal function returned to baseline. His tropnins elevated during trending consistent with an NSTEMI therefore he underwent a diagnostic cath on 12/30/2016 - possible target of PCI at the SVG to diagonal anastamosis. There was severe diffuse disease to the SVG to OM - high risk for no-reflow. It was decided that the patient would be taken back to the cath lab on 8/16  for intervention. He underwent selective angiography in the vein graft to the  RCA which showed a focal 95% stenosis in the RCA just prior to the PLA takeoff. He underwent successful PCI to the distal RCA anastomosis with a 2.75 x 18 mm resolute onyx DES. Additionally, he underwent PCI to the saphenous vein graft to the diagonal with a 2.5 x 22 mm drug-eluting stent. He was observed overnight.  Today he is asymptomatic. His creatinine further improved with hydration to 1.57. He will need a metabolic panel  at his follow-up appointment to ensure stability of his renal function. His troponin peaked at 1.55. He will be discharged on DAPT; Aspirin and Brilinta which he is recommended to stay on indefinitely by Dr. Claiborne Billings.   He is historically intolerant to statins, his LDL 82. Consider PCSK9 inhibitor if the patient can not reach LDL goal of less than 70, we started him on a trial of ezetimibe 10 mg this admission. Hydralazine and Imdur doses increased. A beta blocker was not prescribed because he has baseline bradycardia. No ACE/ARB or aldosterone inhibitors were prescribed this admission because EF is 55-60%.   He takes Vardenafil at home, it has been discontinued for now to be readdressed at his outpatient follow-up appointment.  we just recently increased his dose of Imdur. He can probably restart this after his TOC appt.  The patient has had an uncomplicated hospital course and is recovering well. The femoral catheter site is stable. He has been seen by Dr. Debara Pickett today and deemed ready for discharge home. All follow-up appointments have been scheduled.  A written RX for a 30 day free supply of Brilinta was provided for the patient. A work excuse note was provided as well. Discharge medications are listed below.  _____________  Discharge Vitals Blood pressure (!) 113/46, pulse 63, temperature 97.8 F (36.6 C), temperature source Oral, resp. rate 11, height 5\' 9"  (1.753 m), weight 188 lb 11.4 oz (85.6 kg), SpO2 98 %.  Filed Weights   12/31/16 0500 12/31/16 1045 01/01/17 0047  Weight: 184 lb 4.9 oz (83.6 kg) 184 lb 4.9 oz (83.6 kg) 188 lb 11.4 oz (85.6 kg)    Labs & Radiologic Studies     CBC  Recent Labs  12/31/16 0732 01/01/17 0434  WBC 8.1 5.5  HGB 12.6* 11.2*  HCT 37.8* 33.5*  MCV 93.3 93.3  PLT 179 924   Basic Metabolic Panel  Recent Labs  12/31/16 0728 01/01/17 0434  NA 138 137  K 5.1 4.2  CL 109 108  CO2 21* 24  GLUCOSE 107* 95  BUN 19 13  CREATININE 1.62* 1.57*    CALCIUM 9.1 8.6*   Liver Function Tests No results for input(s): AST, ALT, ALKPHOS, BILITOT, PROT, ALBUMIN in the last 72 hours. No results for input(s): LIPASE, AMYLASE in the last 72 hours. Cardiac Enzymes  Recent Labs  12/30/16 2301 12/31/16 0728 12/31/16 1730  TROPONINI 0.16* 0.94* 1.55*    Fasting Lipid Panel  Recent Labs  12/30/16 0219  CHOL 151  HDL 36*  LDLCALC 82  TRIG 164*  CHOLHDL 4.2   Thyroid Function Tests  Dg Chest 2 View  Result Date: 12/29/2016 CLINICAL DATA:  Chest pain. EXAM: CHEST  2 VIEW COMPARISON:  Chest x-ray dated December 19, 2016. FINDINGS: Prior CABG. The cardiomediastinal silhouette is normal in size. Normal pulmonary vascularity. Atherosclerotic calcification of the aortic arch. Bibasilar atelectasis. No focal consolidation, pleural effusion, or pneumothorax. No acute osseous abnormality. IMPRESSION: No active cardiopulmonary disease. Electronically Signed   By: Titus Dubin M.D.   On:  12/29/2016 16:33   Dg Chest 2 View  Result Date: 12/19/2016 CLINICAL DATA:  anterior chest pain EXAM: CHEST  2 VIEW COMPARISON:  None. FINDINGS: Sternotomy wires overlie normal cardiac silhouette. No effusion, infiltrate pneumothorax. Lungs are hyperinflated. Degenerative osteophytosis of the spine. IMPRESSION: Hyperinflated lungs.  No acute findings. Post CABG. Electronically Signed   By: Suzy Bouchard M.D.   On: 12/19/2016 12:59     Diagnostic Studies/Procedures    12/31/2016 CORONARY STENT INTERVENTION       Prox RCA to Dist RCA lesion, 100 %stenosed.  Ost LAD to Prox LAD lesion, 100 %stenosed.  Ost Ramus to Ramus lesion, 30 %stenosed.  Prox Cx to Mid Cx lesion, 100 %stenosed.  Origin to Prox Graft lesion, 95 %stenosed.  Mid Graft to Dist Graft lesion, 75 %stenosed.  And is normal in caliber.  LIMA and is normal in caliber.  A STENT RESOLUTE ONYX [id number] drug eluting stent was successfully placed.  1st Diag lesion, 90 %stenosed.  Post  intervention, there is a 0% residual stenosis.  A STENT RESOLUTE ONYX [id number] drug eluting stent was successfully placed.  Post Atrio lesion, 95 %stenosed.  Post intervention, there is a 0% residual stenosis.   Selective angiography into the SVG which supplied the RCA revealed that this was a sequential graft anastomosing into the distal RCA continuation branch and the mid PDA.  There was a 95% focal stenosis in the distal RCA continuation branch prior to the PLA takeoff.  Successful PCI to the distal RCA through the saphenous vein graft of the 95% continuation branch stenosis with  insertion of a 2.7518 mm Resolute Onyx DES stent postdilated to 2.8 mm with the 95% stenosis being reduced to 0%.  Successful PCI through the SVG supplying the diagonal (ramus intermediate, like) vessel with ultimate insertion of a 2.522 mm stent deployed at the distal tip of the graft extending into the diagonal vessel with the 90% stenoses being reduced to 0% with post stent dilatation up to 2.7 mm.  RECOMMENDATION: The patient should continue with dual antiplatelet therapy indefinitely, particularly with his high-grade stenosis in the graft supplying the circumflex.  Medical therapy for concomitant CAD.  High potency statin therapy.   12/20/2016 Echocardiogram  Study Conclusions  - Left ventricle: The cavity size was normal. There was mild   concentric hypertrophy. Systolic function was normal. The   estimated ejection fraction was in the range of 55% to 60%. Wall   motion was normal; there were no regional wall motion   abnormalities. Doppler parameters are consistent with abnormal   left ventricular relaxation (grade 1 diastolic dysfunction). - Mitral valve: Calcified annulus. - Left atrium: The atrium was mildly dilated. - Atrial septum: No defect or patent foramen ovale was identified.   _________    Disposition   Pt is being discharged home today in good condition.  Follow-up Plans &  Appointments    Follow-up Information    Patsey Berthold, NP Follow up on 01/15/2017.   Specialty:  Cardiology Why:  Your appointment with Dr. Forde Dandy PA is at 9:40. Please arrive 10-15 minutes early. Contact information: [place] [address] [phone]        Almyra Deforest, [place] Follow up on 01/12/2017.   Specialties:  Cardiology, Radiology Why:  Your appointment is at 11:30am with Dr. Rod Mae PA. Please arrive 10-15 minutes early. Contact information: [address] [place] [address] [phone]          Discharge  Instructions    Amb Referral to Cardiac Rehabilitation    Complete by:  As directed    Diagnosis:   NSTEMI Coronary Stents     Diet - low sodium heart healthy    Complete by:  As directed    Increase activity slowly    Complete by:  As directed    May shower / Bathe    Complete by:  As directed       Discharge Medications   Allergies as of 01/01/2017      Reactions   Fish Allergy Diarrhea, Nausea And Vomiting   Promethazine Hcl Other (See Comments)   Syncope (reaction to phenergan)   Shellfish Allergy Diarrhea, Nausea And Vomiting   Yellow Jacket Venom [bee Venom] Swelling   Localized swelling from yellow jackets and honey bees   Doxycycline Hyclate Other (See Comments)   esophagitis   Lipitor [atorvastatin Calcium] Other (See Comments)   Myalgia/myopathy   Tetracycline Other (See Comments)   Esophagitis   Crestor [rosuvastatin] Other (See Comments)   Myalgia/myopathy   Tetanus Toxoid Swelling, Rash      Medication List    STOP taking these medications   vardenafil 20 MG tablet Commonly known as:  LEVITRA     TAKE these medications   acetaminophen 325 MG tablet Commonly known as:  TYLENOL Take 2 tablets (650 mg total) by mouth every 6 (six) hours as needed for moderate pain.   albuterol 108 (90 Base) MCG/ACT inhaler Commonly known as:  PROVENTIL HFA;VENTOLIN HFA Inhale 2 puffs into the lungs every 6  (six) hours as needed for wheezing or shortness of breath. Reported on 08/09/2015   allopurinol 300 MG tablet Commonly known as:  ZYLOPRIM Take 1 tablet (300 mg total) by mouth daily. What changed:  when to take this   amiodarone 200 MG tablet Commonly known as:  PACERONE Take 0.5 tablets (100 mg total) by mouth daily. What changed:  when to take this   aspirin 81 MG tablet Take 1 tablet (81 mg total) by mouth at bedtime. What changed:  how much to take   b complex vitamins tablet Take 1 tablet by mouth at bedtime.   EPINEPHrine 0.3 mg/0.3 mL Soaj injection Commonly known as:  EPIPEN Inject 0.3 mLs (0.3 mg total) into the muscle once. What changed:  when to take this  reasons to take this   ezetimibe 10 MG tablet Commonly known as:  ZETIA Take 1 tablet (10 mg total) by mouth daily.   hydrALAZINE 50 MG tablet Commonly known as:  APRESOLINE Take 1.5 tablets (75 mg total) by mouth 2 (two) times daily.   isosorbide mononitrate 30 MG 24 hr tablet Commonly known as:  IMDUR Take 1 tablet (30 mg total) by mouth daily. What changed:  how much to take   levothyroxine 125 MCG tablet Commonly known as:  SYNTHROID, LEVOTHROID Take 1 tablet (125 mcg total) by mouth daily.   Magnesium 250 MG Tabs Take 250 mg by mouth at bedtime.   multivitamin with minerals Tabs tablet Take 1 tablet by mouth at bedtime.   neomycin-bacitracin-polymyxin Oint Commonly known as:  NEOSPORIN Apply 1 application topically daily as needed for wound care.   nitroGLYCERIN 0.4 MG SL tablet Commonly known as:  NITROSTAT PLACE 1 TABLET UNDER THE TONGUE EVERY 5 MINUTES AS NEEDED FOR CHEST PAIN FOR UP TO 3 DOSES. What changed:  Another medication with the same name was added. Make sure you understand how and when to take each.  nitroGLYCERIN 0.3 MG SL tablet Commonly known as:  NITROSTAT Place 1 tablet (0.3 mg total) under the tongue every 5 (five) minutes as needed for chest pain. What changed:  You  were already taking a medication with the same name, and this prescription was added. Make sure you understand how and when to take each.   pantoprazole 40 MG tablet Commonly known as:  PROTONIX Take 1 tablet (40 mg total) by mouth daily. Yearly physical due in February must see MD for refills What changed:  when to take this  additional instructions   ticagrelor 90 MG Tabs tablet Commonly known as:  BRILINTA Take 1 tablet (90 mg total) by mouth 2 (two) times daily.   ticagrelor 90 MG Tabs tablet Commonly known as:  BRILINTA Take 1 tablet (90 mg total) by mouth 2 (two) times daily.   tiotropium 18 MCG inhalation capsule Commonly known as:  SPIRIVA HANDIHALER PLACE 1 CAPSULE (18 MCG TOTAL) INTO INHALER AND INHALE DAILY. What changed:  how much to take  how to take this  when to take this  reasons to take this  additional instructions   vitamin C 500 MG tablet Commonly known as:  ASCORBIC ACID Take 500 mg by mouth at bedtime.   Vitamin D 2000 units Caps Take 2,000 Units by mouth at bedtime.       Aspirin prescribed at discharge?  Yes High Intensity Statin Prescribed? (Lipitor 40-80mg  or Crestor 20-40mg ): Yes (Zetia) Beta Blocker Prescribed? No: He has baseline bradycardia For EF 45% or less, Was ACEI/ARB Prescribed?  EF is 55-60% ADP Receptor Inhibitor Prescribed? (i.e. Plavix etc.-Includes Medically Managed Patients): Brilinta For EF <45%, Aldosterone Inhibitor Prescribed? EF is 55-60% Was EF assessed during THIS hospitalization? Yes Was Cardiac Rehab II ordered? (Included Medically managed Patients):yes   Outstanding Labs/Studies   BMP  Duration of Discharge Encounter   Greater than 30 minutes including physician time.  Kristopher Glee PA-C 01/01/2017, 1:29 PM

## 2017-01-01 NOTE — Care Management Important Message (Signed)
Important Message  Patient Details  Name: Omar Benson MRN: [id number] Date of Birth: 1935-03-25   Medicare Important Message Given:  Yes    [name] 01/01/2017, 11:21 AM

## 2017-01-01 NOTE — Progress Notes (Signed)
DAILY PROGRESS NOTE   Patient Name: Omar Benson Date of Encounter: 01/01/2017  Hospital Problem List   Principal Problem:   Non-ST elevation (NSTEMI) myocardial infarction [facility]) Active Problems:   Hyperlipidemia LDL goal <70   Coronary atherosclerosis, a CABG (2004) b. PCI with DES to RCA through SVG and PCI with DES to SVG   ATRIAL FLUTTER, CHRONIC   CKD (chronic kidney disease), stage III   Unstable angina ([place])   Acute on chronic renal failure [facility])    Chief Complaint   No chest pain  Subjective   Omar Benson underwent selective angiography in the vein graft to the RCA which showed a focal 95% stenosis in the RCA just prior to the PLA takeoff. He underwent successful PCI to the distal RCA anastomosis with a 2.75 x 18 mm resolute onyx DES. Additionally, he underwent PCI to the saphenous vein graft to the diagonal with a 2.5 x 22 mm drug-eluting stent.  Objective   Vitals:   01/01/17 0640 01/01/17 0726 01/01/17 1026 01/01/17 1100  BP:  (!) 125/59 129/66 (!) 113/46  Pulse: 61 66  63  Resp:   17 11  Temp:  98.1 F (36.7 C)  97.8 F (36.6 C)  TempSrc:  Oral  Oral  SpO2: 96% 98%  98%  Weight:      Height:        Intake/Output Summary (Last 24 hours) at 01/01/17 1130 Last data filed at 01/01/17 0900  Gross per 24 hour  Intake          1785.96 ml  Output              275 ml  Net          1510.96 ml   Filed Weights   12/31/16 0500 12/31/16 1045 01/01/17 0047  Weight: 184 lb 4.9 oz (83.6 kg) 184 lb 4.9 oz (83.6 kg) 188 lb 11.4 oz (85.6 kg)    Physical Exam   General appearance: alert and no distress Neck: no carotid bruit, no JVD and thyroid not enlarged, symmetric, no tenderness/mass/nodules Lungs: clear to auscultation bilaterally Heart: regular rate and rhythm, S1, S2 normal, no murmur, click, rub or gallop Abdomen: soft, non-tender; bowel sounds normal; no masses,  no organomegaly Extremities: Left radial calf site without ecchymosis, bruit or  hematoma Pulses: 2+ and symmetric Skin: Skin color, texture, turgor normal. No rashes or lesions Neurologic: Grossly normal Psych: Pleasant  Inpatient Medications    Scheduled Meds: . allopurinol  300 mg Oral QHS  . amiodarone  100 mg Oral QHS  . angioplasty book   Does not apply Once  . aspirin  81 mg Oral Daily  . hydrALAZINE  75 mg Oral BID  . isosorbide mononitrate  30 mg Oral Daily  . levothyroxine  125 mcg Oral QAC breakfast  . multivitamin with minerals  1 tablet Oral QHS  . off the beat book   Does not apply Once  . pantoprazole  40 mg Oral QHS  . sodium chloride flush  3 mL Intravenous Q12H  . sodium chloride flush  3 mL Intravenous Q12H  . ticagrelor  90 mg Oral BID  . tiotropium  18 mcg Inhalation Daily    Continuous Infusions: . sodium chloride 50 mL/hr at 12/31/16 0300  . sodium chloride    . sodium chloride 1,000 mL (01/01/17 0200)  . sodium chloride      PRN Meds: sodium chloride, sodium chloride, acetaminophen, diazepam, HYDROmorphone (DILAUDID) injection, nitroGLYCERIN,  ondansetron (ZOFRAN) IV, sodium chloride, sodium chloride flush, sodium chloride flush   Labs   Results for orders placed or performed during the hospital encounter of 12/29/16 (from the past 48 hour(s))  Protime-INR     Status: None   Collection Time: 12/30/16  1:42 PM  Result Value Ref Range   Prothrombin Time 13.7 11.4 - 15.2 seconds   INR 1.05   MRSA PCR Screening     Status: None   Collection Time: 12/30/16  6:09 PM  Result Value Ref Range   MRSA by PCR NEGATIVE NEGATIVE    Comment:        The GeneXpert MRSA Assay (FDA approved for NASAL specimens only), is one component of a comprehensive MRSA colonization surveillance program. It is not intended to diagnose MRSA infection nor to guide or monitor treatment for MRSA infections.   Troponin I     Status: Abnormal   Collection Time: 12/30/16 11:01 PM  Result Value Ref Range   Troponin I 0.16 (HH) <0.03 ng/mL    Comment:  CRITICAL VALUE NOTED.  VALUE IS CONSISTENT WITH PREVIOUSLY REPORTED AND CALLED VALUE.  Troponin I     Status: Abnormal   Collection Time: 12/31/16  7:28 AM  Result Value Ref Range   Troponin I 0.94 (HH) <0.03 ng/mL    Comment: REPEATED TO VERIFY CRITICAL VALUE NOTED.  VALUE IS CONSISTENT WITH PREVIOUSLY REPORTED AND CALLED VALUE.   Heparin level (unfractionated)     Status: None   Collection Time: 12/31/16  7:28 AM  Result Value Ref Range   Heparin Unfractionated 0.40 0.30 - 0.70 IU/mL    Comment:        IF HEPARIN RESULTS ARE BELOW EXPECTED VALUES, AND PATIENT DOSAGE HAS BEEN CONFIRMED, SUGGEST FOLLOW UP TESTING OF ANTITHROMBIN III LEVELS.   Basic metabolic panel     Status: Abnormal   Collection Time: 12/31/16  7:28 AM  Result Value Ref Range   Sodium 138 135 - 145 mmol/L   Potassium 5.1 3.5 - 5.1 mmol/L   Chloride 109 101 - 111 mmol/L   CO2 21 (L) 22 - 32 mmol/L   Glucose, Bld 107 (H) 65 - 99 mg/dL   BUN 19 6 - 20 mg/dL   Creatinine, Ser 1.62 (H) 0.61 - 1.24 mg/dL   Calcium 9.1 8.9 - 10.3 mg/dL   GFR calc non Af Amer 38 (L) >60 mL/min   GFR calc Af Amer 45 (L) >60 mL/min    Comment: (NOTE) The eGFR has been calculated using the CKD EPI equation. This calculation has not been validated in all clinical situations. eGFR's persistently <60 mL/min signify possible Chronic Kidney Disease.    Anion gap 8 5 - 15  CBC     Status: Abnormal   Collection Time: 12/31/16  7:32 AM  Result Value Ref Range   WBC 8.1 4.0 - 10.5 K/uL   RBC 4.05 (L) 4.22 - 5.81 MIL/uL   Hemoglobin 12.6 (L) 13.0 - 17.0 g/dL   HCT 37.8 (L) 39.0 - 52.0 %   MCV 93.3 78.0 - 100.0 fL   MCH 31.1 26.0 - 34.0 pg   MCHC 33.3 30.0 - 36.0 g/dL   RDW 14.3 11.5 - 15.5 %   Platelets 179 150 - 400 K/uL  POCT Activated clotting time     Status: None   Collection Time: 12/31/16  2:43 PM  Result Value Ref Range   Activated Clotting Time 378 seconds  Troponin I (serum)  Status: Abnormal   Collection Time:  12/31/16  5:30 PM  Result Value Ref Range   Troponin I 1.55 (HH) <0.03 ng/mL    Comment: CRITICAL VALUE NOTED.  VALUE IS CONSISTENT WITH PREVIOUSLY REPORTED AND CALLED VALUE.  CBC     Status: Abnormal   Collection Time: 01/01/17  4:34 AM  Result Value Ref Range   WBC 5.5 4.0 - 10.5 K/uL   RBC 3.59 (L) 4.22 - 5.81 MIL/uL   Hemoglobin 11.2 (L) 13.0 - 17.0 g/dL   HCT 33.5 (L) 39.0 - 52.0 %   MCV 93.3 78.0 - 100.0 fL   MCH 31.2 26.0 - 34.0 pg   MCHC 33.4 30.0 - 36.0 g/dL   RDW 14.3 11.5 - 15.5 %   Platelets 183 150 - 400 K/uL  Basic metabolic panel     Status: Abnormal   Collection Time: 01/01/17  4:34 AM  Result Value Ref Range   Sodium 137 135 - 145 mmol/L   Potassium 4.2 3.5 - 5.1 mmol/L    Comment: DELTA CHECK NOTED   Chloride 108 101 - 111 mmol/L   CO2 24 22 - 32 mmol/L   Glucose, Bld 95 65 - 99 mg/dL   BUN 13 6 - 20 mg/dL   Creatinine, Ser 1.57 (H) 0.61 - 1.24 mg/dL   Calcium 8.6 (L) 8.9 - 10.3 mg/dL   GFR calc non Af Amer 40 (L) >60 mL/min   GFR calc Af Amer 46 (L) >60 mL/min    Comment: (NOTE) The eGFR has been calculated using the CKD EPI equation. This calculation has not been validated in all clinical situations. eGFR's persistently <60 mL/min signify possible Chronic Kidney Disease.    Anion gap 5 5 - 15    ECG   Normal sinus rhythm with bifascicular block at 62 - Personally Reviewed  Telemetry   Normal sinus rhythm- Personally Reviewed  Radiology    No results found.  Cardiac Studies   Conclusion     Prox RCA to Dist RCA lesion, 100 %stenosed.  Ost LAD to Prox LAD lesion, 100 %stenosed.  Ost Ramus to Ramus lesion, 30 %stenosed.  Prox Cx to Mid Cx lesion, 100 %stenosed.  Origin to Prox Graft lesion, 95 %stenosed.  Mid Graft to Dist Graft lesion, 75 %stenosed.  And is normal in caliber.  LIMA and is normal in caliber.  A STENT RESOLUTE ONYX [id number] drug eluting stent was successfully placed.  1st Diag lesion, 90 %stenosed.  Post  intervention, there is a 0% residual stenosis.  A STENT RESOLUTE ONYX [id number] drug eluting stent was successfully placed.  Post Atrio lesion, 95 %stenosed.  Post intervention, there is a 0% residual stenosis.   Selective angiography into the SVG which supplied the RCA revealed that this was a sequential graft anastomosing into the distal RCA continuation branch and the mid PDA.  There was a 95% focal stenosis in the distal RCA continuation branch prior to the PLA takeoff.  Successful PCI to the distal RCA through the saphenous vein graft of the 95% continuation branch stenosis with  insertion of a 2.7518 mm Resolute Onyx DES stent postdilated to 2.8 mm with the 95% stenosis being reduced to 0%.  Successful PCI through the SVG supplying the diagonal (ramus intermediate, like) vessel with ultimate insertion of a 2.522 mm stent deployed at the distal tip of the graft extending into the diagonal vessel with the 90% stenoses being reduced to 0% with post stent dilatation up to 2.7 mm.  RECOMMENDATION: The patient should continue with dual antiplatelet therapy indefinitely, particularly with his high-grade stenosis in the graft supplying the circumflex.  Medical therapy for concomitant CAD.  High potency statin therapy.    Assessment   1. Principal Problem: 2.   Non-ST elevation (NSTEMI) myocardial infarction ([place]) 3. Active Problems: 4.   Hyperlipidemia LDL goal <70 5.   Coronary atherosclerosis, a CABG (2004) b. PCI with DES to RCA through SVG and PCI with DES to SVG 6.   ATRIAL FLUTTER, CHRONIC 7.   CKD (chronic kidney disease), stage III 8.   Unstable angina ([place]) 9.   Acute on chronic renal failure ([place]) 10.   Plan   1. Mr. Skirvin had successful PCI to the anastomosis of the saphenous vein graft to the right coronary as well as a saphenous vein graft to the diagonal branch with drug-eluting stents. He is asymptomatic today. Creatinine further improve with hydration 1.57. He'll need  an outpatient metabolic profile in a week to ensure stability of his renal function. Troponin ultimately rose to 1.55. He will be discharged on dual antiplatelet therapy which is recommended indefinitely by Dr. Claiborne Billings. He has been intolerant to statins in the past. His LDL is 82. I would advise adding ezetimibe 10 mg daily. He should be evaluated for PCSK9 therapy if he is unable to reach goal LDL less than 70 as an outpatient. [place] for discharge home today.  Time Spent Directly with Patient:  I have spent a total of 15 minutes with the patient reviewing hospital notes, telemetry, EKGs, labs and examining the patient as well as establishing an assessment and plan that was discussed personally with the patient. > 50% of time was spent in direct patient care.  Length of Stay:  LOS: 3 days   Pixie Casino, MD, [place]  Attending Cardiologist  Direct Dial: [phone]  Fax: [phone]  Website:  [email or web address] 01/01/2017, 11:30 AM

## 2017-01-01 NOTE — Progress Notes (Signed)
Site area: R groin  Site Prior to Removal:  Level 0  Pressure Applied For  25 MINUTES    Minutes Beginning at 19:40  Manual:   Yes  Patient Status During Pull: Stable and comfortable  Post Pull Groin Site:  Level 0  Post Pull Instructions Given:  Yes  Post Pull Pulses Present:  Yes  Dressing Applied:  Yes  Comments:   Pt tolerated procedure well. VS remained stable throughout sheath pull. Pt received Morphine 2 mg IV prior to sheath pull. Pulses palpable. Instructions given to both pt and wife.

## 2017-01-01 NOTE — Discharge Instructions (Signed)
Coronary Angiogram With Stent Coronary angiogram with stent placement is a procedure to widen or open a narrow blood vessel of the heart (coronary artery). Arteries may become blocked by cholesterol buildup (plaques) in the lining or wall. When a coronary artery becomes partially blocked, blood flow to that area decreases. This may lead to chest pain or a heart attack (myocardial infarction). A stent is a small piece of metal that looks like mesh or a spring. Stent placement may be done as treatment for a heart attack or right after a coronary angiogram in which a blocked artery is found. Let your health care provider know about:  Any allergies you have.  All medicines you are taking, including vitamins, herbs, eye drops, creams, and over-the-counter medicines.  Any problems you or family members have had with anesthetic medicines.  Any blood disorders you have.  Any surgeries you have had.  Any medical conditions you have.  Whether you are pregnant or may be pregnant. What are the risks? Generally, this is a safe procedure. However, problems may occur, including:  Damage to the heart or its blood vessels.  A return of blockage.  Bleeding, infection, or bruising at the insertion site.  A collection of blood under the skin (hematoma) at the insertion site.  A blood clot in another part of the body.  Kidney injury.  Allergic reaction to the dye or contrast that is used.  Bleeding into the abdomen (retroperitoneal bleeding).  What happens before the procedure? Staying hydrated Follow instructions from your health care provider about hydration, which may include:  Up to 2 hours before the procedure - you may continue to drink clear liquids, such as water, clear fruit juice, black coffee, and plain tea.  Eating and drinking restrictions Follow instructions from your health care provider about eating and drinking, which may include:  8 hours before the procedure - stop eating  heavy meals or foods such as meat, fried foods, or fatty foods.  6 hours before the procedure - stop eating light meals or foods, such as toast or cereal.  2 hours before the procedure - stop drinking clear liquids.  Ask your health care provider about:  Changing or stopping your regular medicines. This is especially important if you are taking diabetes medicines or blood thinners.  Taking medicines such as ibuprofen. These medicines can thin your blood. Do not take these medicines before your procedure if your health care provider instructs you not to. Generally, aspirin is recommended before a procedure of passing a small, thin tube (catheter) through a blood vessel and into the heart (cardiac catheterization).  What happens during the procedure?  An IV tube will be inserted into one of your veins.  You will be given one or more of the following: ? A medicine to help you relax (sedative). ? A medicine to numb the area where the catheter will be inserted into an artery (local anesthetic).  To reduce your risk of infection: ? Your health care team will wash or sanitize their hands. ? Your skin will be washed with soap. ? Hair may be removed from the area where the catheter will be inserted.  Using a guide wire, the catheter will be inserted into an artery. The location may be in your groin, in your wrist, or in the fold of your arm (near your elbow).  A type of X-ray (fluoroscopy) will be used to help guide the catheter to the opening of the arteries in the heart.  A  dye will be injected into the catheter, and X-rays will be taken. The dye will help to show where any narrowing or blockages are located in the arteries.  A tiny wire will be guided to the blocked spot, and a balloon will be inflated to make the artery wider.  The stent will be expanded and will crush the plaques into the wall of the vessel. The stent will hold the area open and improve the blood flow. Most stents have a  drug coating to reduce the risk of the stent narrowing over time.  The artery may be made wider using a drill, laser, or other tools to remove plaques.  When the blood flow is better, the catheter will be removed. The lining of the artery will grow over the stent, which stays where it was placed. This procedure may vary among health care providers and hospitals. What happens after the procedure?  If the procedure is done through the leg, you will be kept in bed lying flat for about 6 hours. You will be instructed to not bend and not cross your legs.  The insertion site will be checked frequently.  The pulse in your foot or wrist will be checked frequently.  You may have additional blood tests, X-rays, and a test that records the electrical activity of your heart (electrocardiogram, or ECG). This information is not intended to replace advice given to you by your health care provider. Make sure you discuss any questions you have with your health care provider. Document Released: 11/08/2002 Document Revised: 01/02/2016 Document Reviewed: 12/08/2015 Elsevier Interactive Patient Education  2017 [employer].

## 2017-01-01 NOTE — Care Management Note (Signed)
Case Management Note  Patient Details  Name: Omar Benson MRN: [id number] Date of Birth: 1934-12-24  Subjective/Objective:  Pt presented for Nstemi- post cardiac cath. Plan for home on Brilinta. PTA pt was independent with ADL's from home with wife and he is able to afford medications. Benefits Check Completed for Brilinta-30 day free card to be provided.                    Action/Plan: Co pay $29.00 for Brilinta. Pt will pick up 30 day free at the [facility] on [address]. Pt will need Rx for 30 day free and additional for refills. No further needs from CM at this time.   Expected Discharge Date:  12/31/16               Expected Discharge Plan:  Home/Self Care  In-House Referral:  NA  Discharge planning Services  CM Consult, Medication Assistance  Post Acute Care Choice:  NA Choice offered to:  NA  DME Arranged:  N/A DME Agency:  NA  HH Arranged:  NA HH Agency:  NA  Status of Service:  Completed, signed off  If discussed at [place] of Stay Meetings, dates discussed:    Additional Comments:  Bethena Roys, RN 01/01/2017, 12:14 PM

## 2017-01-01 NOTE — Progress Notes (Signed)
Pt and family members given all discharge instructions and verbalized understanding.  Pt and family is aware of where to pick up brilinta today and is educated on all prescriptions and new meds.  Pt rt groin is level 0 and pt is without any pain at time of discharge.  Pt home with wife via wc and with all belongings.

## 2017-01-12 ENCOUNTER — Encounter: Payer: Self-pay | Admitting: Physician Assistant

## 2017-01-12 ENCOUNTER — Ambulatory Visit (INDEPENDENT_AMBULATORY_CARE_PROVIDER_SITE_OTHER): Payer: Medicare Other | Admitting: Physician Assistant

## 2017-01-12 VITALS — BP 122/70 | HR 55 | Ht 69.0 in | Wt 181.6 lb

## 2017-01-12 DIAGNOSIS — I200 Unstable angina: Secondary | ICD-10-CM

## 2017-01-12 DIAGNOSIS — I480 Paroxysmal atrial fibrillation: Secondary | ICD-10-CM

## 2017-01-12 DIAGNOSIS — E785 Hyperlipidemia, unspecified: Secondary | ICD-10-CM | POA: Diagnosis not present

## 2017-01-12 DIAGNOSIS — N183 Chronic kidney disease, stage 3 unspecified: Secondary | ICD-10-CM

## 2017-01-12 DIAGNOSIS — J449 Chronic obstructive pulmonary disease, unspecified: Secondary | ICD-10-CM

## 2017-01-12 DIAGNOSIS — I25810 Atherosclerosis of coronary artery bypass graft(s) without angina pectoris: Secondary | ICD-10-CM

## 2017-01-12 MED ORDER — HYDRALAZINE HCL 25 MG PO TABS: 37.5000 mg | ORAL_TABLET | Freq: Two times a day (BID) | ORAL | 11 refills | Status: DC

## 2017-01-12 MED ORDER — NITROGLYCERIN 0.4 MG SL SUBL: 0.4000 mg | SUBLINGUAL_TABLET | SUBLINGUAL | 2 refills | Status: DC | PRN

## 2017-01-12 NOTE — Patient Instructions (Signed)
Medication Instructions:  RESTART- Zetia   Labwork: Fasting Lipids liver 2 Months  Testing/Procedures: None ordered  Follow-Up: Your physician recommends that you schedule a follow-up appointment in: 3 Months with Dr Debara Pickett   Any Other Special Instructions Will Be Listed Below (If Applicable).     If you need a refill on your cardiac medications before your next appointment, please call your pharmacy.
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
## 2017-01-13 ENCOUNTER — Encounter: Payer: Self-pay | Admitting: Physician Assistant

## 2017-01-15 ENCOUNTER — Ambulatory Visit: Payer: Medicare Other | Admitting: Nurse Practitioner

## 2017-01-19 ENCOUNTER — Observation Stay (HOSPITAL_COMMUNITY)
Admission: EM | Admit: 2017-01-19 | Discharge: 2017-01-20 | Disposition: A | Discharge status: Home / Self Care | Payer: Medicare Other | Attending: Family Medicine | Admitting: Family Medicine

## 2017-01-19 ENCOUNTER — Encounter (HOSPITAL_COMMUNITY): Payer: Self-pay | Admitting: *Deleted

## 2017-01-19 ENCOUNTER — Other Ambulatory Visit: Payer: Self-pay

## 2017-01-19 ENCOUNTER — Emergency Department (HOSPITAL_COMMUNITY): Payer: Medicare Other

## 2017-01-19 DIAGNOSIS — R079 Chest pain, unspecified: Secondary | ICD-10-CM

## 2017-01-19 DIAGNOSIS — I10 Essential (primary) hypertension: Secondary | ICD-10-CM | POA: Diagnosis not present

## 2017-01-19 DIAGNOSIS — N183 Chronic kidney disease, stage 3 (moderate): Secondary | ICD-10-CM | POA: Insufficient documentation

## 2017-01-19 DIAGNOSIS — Z887 Allergy status to serum and vaccine status: Secondary | ICD-10-CM | POA: Insufficient documentation

## 2017-01-19 DIAGNOSIS — I480 Paroxysmal atrial fibrillation: Secondary | ICD-10-CM | POA: Insufficient documentation

## 2017-01-19 DIAGNOSIS — Z8371 Family history of colonic polyps: Secondary | ICD-10-CM | POA: Insufficient documentation

## 2017-01-19 DIAGNOSIS — Z7982 Long term (current) use of aspirin: Secondary | ICD-10-CM | POA: Diagnosis not present

## 2017-01-19 DIAGNOSIS — Z87891 Personal history of nicotine dependence: Secondary | ICD-10-CM | POA: Insufficient documentation

## 2017-01-19 DIAGNOSIS — Z951 Presence of aortocoronary bypass graft: Secondary | ICD-10-CM | POA: Diagnosis not present

## 2017-01-19 DIAGNOSIS — E785 Hyperlipidemia, unspecified: Secondary | ICD-10-CM | POA: Diagnosis not present

## 2017-01-19 DIAGNOSIS — K219 Gastro-esophageal reflux disease without esophagitis: Secondary | ICD-10-CM | POA: Insufficient documentation

## 2017-01-19 DIAGNOSIS — I495 Sick sinus syndrome: Secondary | ICD-10-CM | POA: Insufficient documentation

## 2017-01-19 DIAGNOSIS — Z91030 Bee allergy status: Secondary | ICD-10-CM | POA: Insufficient documentation

## 2017-01-19 DIAGNOSIS — I252 Old myocardial infarction: Secondary | ICD-10-CM | POA: Insufficient documentation

## 2017-01-19 DIAGNOSIS — M109 Gout, unspecified: Secondary | ICD-10-CM | POA: Diagnosis not present

## 2017-01-19 DIAGNOSIS — I2510 Atherosclerotic heart disease of native coronary artery without angina pectoris: Secondary | ICD-10-CM | POA: Diagnosis present

## 2017-01-19 DIAGNOSIS — I739 Peripheral vascular disease, unspecified: Secondary | ICD-10-CM | POA: Diagnosis not present

## 2017-01-19 DIAGNOSIS — Z888 Allergy status to other drugs, medicaments and biological substances status: Secondary | ICD-10-CM | POA: Insufficient documentation

## 2017-01-19 DIAGNOSIS — I4510 Unspecified right bundle-branch block: Secondary | ICD-10-CM | POA: Diagnosis not present

## 2017-01-19 DIAGNOSIS — I4892 Unspecified atrial flutter: Secondary | ICD-10-CM | POA: Diagnosis not present

## 2017-01-19 DIAGNOSIS — Z8249 Family history of ischemic heart disease and other diseases of the circulatory system: Secondary | ICD-10-CM | POA: Insufficient documentation

## 2017-01-19 DIAGNOSIS — J439 Emphysema, unspecified: Secondary | ICD-10-CM | POA: Insufficient documentation

## 2017-01-19 DIAGNOSIS — Z9889 Other specified postprocedural states: Secondary | ICD-10-CM | POA: Insufficient documentation

## 2017-01-19 DIAGNOSIS — Z881 Allergy status to other antibiotic agents status: Secondary | ICD-10-CM | POA: Insufficient documentation

## 2017-01-19 DIAGNOSIS — Z79899 Other long term (current) drug therapy: Secondary | ICD-10-CM | POA: Diagnosis not present

## 2017-01-19 DIAGNOSIS — Z87442 Personal history of urinary calculi: Secondary | ICD-10-CM | POA: Insufficient documentation

## 2017-01-19 DIAGNOSIS — R0789 Other chest pain: Secondary | ICD-10-CM | POA: Diagnosis not present

## 2017-01-19 DIAGNOSIS — E039 Hypothyroidism, unspecified: Secondary | ICD-10-CM | POA: Diagnosis present

## 2017-01-19 DIAGNOSIS — Z955 Presence of coronary angioplasty implant and graft: Secondary | ICD-10-CM | POA: Diagnosis not present

## 2017-01-19 DIAGNOSIS — I129 Hypertensive chronic kidney disease with stage 1 through stage 4 chronic kidney disease, or unspecified chronic kidney disease: Secondary | ICD-10-CM | POA: Diagnosis not present

## 2017-01-19 DIAGNOSIS — I700 Atherosclerosis of aorta: Secondary | ICD-10-CM | POA: Insufficient documentation

## 2017-01-19 DIAGNOSIS — R072 Precordial pain: Secondary | ICD-10-CM

## 2017-01-19 DIAGNOSIS — J449 Chronic obstructive pulmonary disease, unspecified: Secondary | ICD-10-CM | POA: Diagnosis present

## 2017-01-19 DIAGNOSIS — Z91013 Allergy to seafood: Secondary | ICD-10-CM | POA: Insufficient documentation

## 2017-01-19 DIAGNOSIS — N529 Male erectile dysfunction, unspecified: Secondary | ICD-10-CM | POA: Diagnosis not present

## 2017-01-19 LAB — BASIC METABOLIC PANEL
Anion gap: 8 (ref 5–15)
BUN: 21 mg/dL — ABNORMAL HIGH (ref 6–20)
CHLORIDE: 106 mmol/L (ref 101–111)
CO2: 21 mmol/L — ABNORMAL LOW (ref 22–32)
Calcium: 9.6 mg/dL (ref 8.9–10.3)
Creatinine, Ser: 1.83 mg/dL — ABNORMAL HIGH (ref 0.61–1.24)
GFR, EST AFRICAN AMERICAN: 38 mL/min — AB (ref >60)
GFR, EST NON AFRICAN AMERICAN: 33 mL/min — AB (ref >60)
Glucose, Bld: 116 mg/dL — ABNORMAL HIGH (ref 65–99)
POTASSIUM: 4.5 mmol/L (ref 3.5–5.1)
SODIUM: 135 mmol/L (ref 135–145)

## 2017-01-19 LAB — CBC
HCT: 33.4 % — ABNORMAL LOW (ref 39.0–52.0)
HEMATOCRIT: 36.6 % — AB (ref 39.0–52.0)
Hemoglobin: 11.1 g/dL — ABNORMAL LOW (ref 13.0–17.0)
Hemoglobin: 12.1 g/dL — ABNORMAL LOW (ref 13.0–17.0)
MCH: 30.8 pg (ref 26.0–34.0)
MCH: 30.9 pg (ref 26.0–34.0)
MCHC: 33.1 g/dL (ref 30.0–36.0)
MCHC: 33.2 g/dL (ref 30.0–36.0)
MCV: 93 fL (ref 78.0–100.0)
MCV: 93.1 fL (ref 78.0–100.0)
PLATELETS: 209 10*3/uL (ref 150–400)
Platelets: 224 10*3/uL (ref 150–400)
RBC: 3.59 MIL/uL — AB (ref 4.22–5.81)
RBC: 3.93 MIL/uL — ABNORMAL LOW (ref 4.22–5.81)
RDW: 13.9 % (ref 11.5–15.5)
RDW: 14 % (ref 11.5–15.5)
WBC: 5.1 10*3/uL (ref 4.0–10.5)
WBC: 5.2 10*3/uL (ref 4.0–10.5)

## 2017-01-19 LAB — CREATININE, SERUM
Creatinine, Ser: 1.79 mg/dL — ABNORMAL HIGH (ref 0.61–1.24)
GFR calc Af Amer: 40 mL/min — ABNORMAL LOW
GFR calc non Af Amer: 34 mL/min — ABNORMAL LOW

## 2017-01-19 LAB — IRON AND TIBC
Iron: 52 ug/dL (ref 45–182)
Saturation Ratios: 17 % — ABNORMAL LOW (ref 17.9–39.5)
TIBC: 300 ug/dL (ref 250–450)
UIBC: 248 ug/dL

## 2017-01-19 LAB — GLUCOSE, CAPILLARY: GLUCOSE-CAPILLARY: 139 mg/dL — AB (ref 65–99)

## 2017-01-19 LAB — I-STAT TROPONIN, ED: Troponin i, poc: 0 ng/mL (ref 0.00–0.08)

## 2017-01-19 LAB — FOLATE: Folate: 25 ng/mL

## 2017-01-19 LAB — TROPONIN I
Troponin I: <0.03 ng/mL
Troponin I: <0.03 ng/mL (ref <0.03)
Troponin I: <0.03 ng/mL

## 2017-01-19 LAB — CBG MONITORING, ED: GLUCOSE-CAPILLARY: 101 mg/dL — AB (ref 65–99)

## 2017-01-19 LAB — RETICULOCYTES
RBC.: 3.63 MIL/uL — ABNORMAL LOW (ref 4.22–5.81)
Retic Count, Absolute: 21.8 K/uL (ref 19.0–186.0)
Retic Ct Pct: 0.6 % (ref 0.4–3.1)

## 2017-01-19 LAB — FERRITIN: Ferritin: 85 ng/mL (ref 24–336)

## 2017-01-19 LAB — VITAMIN B12: VITAMIN B 12: 307 pg/mL (ref 180–914)

## 2017-01-19 MED ORDER — NITROGLYCERIN 2 % TD OINT
1.0000 [in_us] | TOPICAL_OINTMENT | Freq: Once | TRANSDERMAL | Status: AC
  Administered 2017-01-19: 1 [in_us] via TOPICAL
  Filled 2017-01-19: qty 1

## 2017-01-19 MED ORDER — ISOSORBIDE MONONITRATE ER 30 MG PO TB24
30.0000 mg | ORAL_TABLET | Freq: Every day | ORAL | Status: DC
  Administered 2017-01-19 – 2017-01-20 (×2): 30 mg via ORAL
  Filled 2017-01-19 (×2): qty 1

## 2017-01-19 MED ORDER — ACETAMINOPHEN 325 MG PO TABS: 650.0000 mg | ORAL_TABLET | ORAL | Status: DC | PRN

## 2017-01-19 MED ORDER — MAGNESIUM OXIDE 400 (241.3 MG) MG PO TABS
400.0000 mg | ORAL_TABLET | Freq: Every day | ORAL | Status: DC
  Administered 2017-01-19: 400 mg via ORAL
  Filled 2017-01-19: qty 1

## 2017-01-19 MED ORDER — TICAGRELOR 90 MG PO TABS
90.0000 mg | ORAL_TABLET | Freq: Two times a day (BID) | ORAL | Status: DC
  Administered 2017-01-19 – 2017-01-20 (×3): 90 mg via ORAL
  Filled 2017-01-19 (×3): qty 1

## 2017-01-19 MED ORDER — ALLOPURINOL 300 MG PO TABS
300.0000 mg | ORAL_TABLET | Freq: Every day | ORAL | Status: DC
  Administered 2017-01-19: 300 mg via ORAL
  Filled 2017-01-19: qty 1

## 2017-01-19 MED ORDER — ONDANSETRON HCL 4 MG/2ML IJ SOLN: 4.0000 mg | Freq: Four times a day (QID) | INTRAMUSCULAR | Status: DC | PRN

## 2017-01-19 MED ORDER — CYCLOBENZAPRINE HCL 10 MG PO TABS: 5.0000 mg | ORAL_TABLET | Freq: Three times a day (TID) | ORAL | Status: DC | PRN

## 2017-01-19 MED ORDER — NITROGLYCERIN 0.4 MG SL SUBL
0.4000 mg | SUBLINGUAL_TABLET | SUBLINGUAL | Status: DC | PRN
  Administered 2017-01-19: 0.4 mg via SUBLINGUAL
  Filled 2017-01-19 (×2): qty 1

## 2017-01-19 MED ORDER — ADULT MULTIVITAMIN W/MINERALS CH
1.0000 | ORAL_TABLET | Freq: Every day | ORAL | Status: DC
  Administered 2017-01-19: 1 via ORAL
  Filled 2017-01-19: qty 1

## 2017-01-19 MED ORDER — PANTOPRAZOLE SODIUM 40 MG PO TBEC
40.0000 mg | DELAYED_RELEASE_TABLET | Freq: Every day | ORAL | Status: DC
  Administered 2017-01-19: 40 mg via ORAL
  Filled 2017-01-19: qty 1

## 2017-01-19 MED ORDER — TIOTROPIUM BROMIDE MONOHYDRATE 18 MCG IN CAPS: 18.0000 ug | ORAL_CAPSULE | Freq: Every day | RESPIRATORY_TRACT | Status: DC | PRN

## 2017-01-19 MED ORDER — ASPIRIN EC 81 MG PO TBEC
81.0000 mg | DELAYED_RELEASE_TABLET | Freq: Every day | ORAL | Status: DC
  Administered 2017-01-19: 81 mg via ORAL
  Filled 2017-01-19: qty 1

## 2017-01-19 MED ORDER — AMIODARONE HCL 200 MG PO TABS
100.0000 mg | ORAL_TABLET | Freq: Every day | ORAL | Status: DC
  Administered 2017-01-19: 100 mg via ORAL
  Filled 2017-01-19: qty 1

## 2017-01-19 MED ORDER — MORPHINE SULFATE (PF) 4 MG/ML IV SOLN
2.0000 mg | INTRAVENOUS | Status: DC | PRN
  Administered 2017-01-19: 2 mg via INTRAVENOUS
  Filled 2017-01-19: qty 1

## 2017-01-19 MED ORDER — LEVOTHYROXINE SODIUM 25 MCG PO TABS
125.0000 ug | ORAL_TABLET | Freq: Every day | ORAL | Status: DC
  Administered 2017-01-19 – 2017-01-20 (×2): 125 ug via ORAL
  Filled 2017-01-19 (×3): qty 1

## 2017-01-19 MED ORDER — HYDRALAZINE HCL 25 MG PO TABS
37.5000 mg | ORAL_TABLET | Freq: Two times a day (BID) | ORAL | Status: DC
  Administered 2017-01-19 – 2017-01-20 (×3): 37.5 mg via ORAL
  Filled 2017-01-19 (×3): qty 2

## 2017-01-19 MED ORDER — NITROGLYCERIN 0.4 MG SL SUBL: 0.4000 mg | SUBLINGUAL_TABLET | SUBLINGUAL | Status: DC | PRN

## 2017-01-19 MED ORDER — MORPHINE SULFATE (PF) 4 MG/ML IV SOLN
4.0000 mg | Freq: Once | INTRAVENOUS | Status: AC
  Administered 2017-01-19: 4 mg via INTRAVENOUS
  Filled 2017-01-19: qty 1

## 2017-01-19 MED ORDER — ENOXAPARIN SODIUM 40 MG/0.4ML ~~LOC~~ SOLN
40.0000 mg | Freq: Every day | SUBCUTANEOUS | Status: DC
  Administered 2017-01-19 – 2017-01-20 (×2): 40 mg via SUBCUTANEOUS
  Filled 2017-01-19 (×3): qty 0.4

## 2017-01-19 MED ORDER — EZETIMIBE 10 MG PO TABS
10.0000 mg | ORAL_TABLET | Freq: Every day | ORAL | Status: DC
  Administered 2017-01-19 – 2017-01-20 (×2): 10 mg via ORAL
  Filled 2017-01-19 (×3): qty 1

## 2017-01-19 NOTE — Progress Notes (Signed)
01/19/2017 1510 Received pt to 4E11 from ED.  Pt is A&O.  No C/O chest pain at this time.  Tele monitor applied and CCMD notified.  Oriented to room, call light and bed.  Call bell in reach, family at bedside. [place]

## 2017-01-19 NOTE — ED Notes (Addendum)
CBG 101 

## 2017-01-19 NOTE — Progress Notes (Signed)
Progress Note  Patient Name: Omar Benson Date of Encounter: 01/19/2017  Primary Cardiologist: New to Dr. Debara Pickett EP : Dr. Lovena Le  Subjective   Feeling well. No chest pain, sob or palpitations. Chest pain free since received IV morphine.   Inpatient Medications    Scheduled Meds: . allopurinol  300 mg Oral QHS  . amiodarone  100 mg Oral QHS  . aspirin EC  81 mg Oral QHS  . enoxaparin (LOVENOX) injection  40 mg Subcutaneous Daily  . ezetimibe  10 mg Oral Daily  . hydrALAZINE  37.5 mg Oral BID  . isosorbide mononitrate  30 mg Oral Daily  . levothyroxine  125 mcg Oral QAC breakfast  . magnesium oxide  400 mg Oral QHS  . multivitamin with minerals  1 tablet Oral QHS  . pantoprazole  40 mg Oral QHS  . ticagrelor  90 mg Oral BID   Continuous Infusions:  PRN Meds: acetaminophen, morphine injection, nitroGLYCERIN, ondansetron (ZOFRAN) IV, tiotropium   Vital Signs    Vitals:   01/19/17 0800 01/19/17 0845 01/19/17 0900 01/19/17 0930  BP: 130/66 139/75 139/74 126/71  Pulse: (!) 48 (!) 50 (!) 50 (!) 52  Resp: 17 13 12  (!) 21  Temp:      SpO2: 96% 95% 97% 96%   No intake or output data in the 24 hours ending 01/19/17 1017 There were no vitals filed for this visit.  Telemetry    Sinus rhythm - Personally Reviewed  ECG   Sinus Bradycardia at rate of 59 bpm. No acute changes - Personally Reviewed  Physical Exam   GEN: No acute distress.   Neck: No JVD Cardiac: RRR, no murmurs, rubs, or gallops. TTP at left upper chest.  Respiratory: Clear to auscultation bilaterally. GI: Soft, nontender, non-distended  MS: No edema; No deformity. Neuro:  Nonfocal  Psych: Normal affect   Labs    Chemistry Recent Labs Lab 01/19/17 0015 01/19/17 0404  NA 135  --   K 4.5  --   CL 106  --   CO2 21*  --   GLUCOSE 116*  --   BUN 21*  --   CREATININE 1.83* 1.79*  CALCIUM 9.6  --   GFRNONAA 33* 34*  GFRAA 38* 40*  ANIONGAP 8  --      Hematology Recent Labs Lab  01/19/17 0015 01/19/17 0404 01/19/17 0618  WBC 5.2 5.1  --   RBC 3.93* 3.59* 3.63*  HGB 12.1* 11.1*  --   HCT 36.6* 33.4*  --   MCV 93.1 93.0  --   MCH 30.8 30.9  --   MCHC 33.1 33.2  --   RDW 14.0 13.9  --   PLT 224 209  --     Cardiac Enzymes Recent Labs Lab 01/19/17 0404  TROPONINI <0.03    Recent Labs Lab 01/19/17 0036  TROPIPOC 0.00     BNPNo results for input(s): BNP, PROBNP in the last 168 hours.   DDimer No results for input(s): DDIMER in the last 168 hours.   Radiology    Dg Chest 2 View  Result Date: 01/19/2017 CLINICAL DATA:  Chest pain tonight. EXAM: CHEST  2 VIEW COMPARISON:  12/29/2016 FINDINGS: Moderate hyperinflation, unchanged. Mild chronic interstitial coarsening. No edema. No consolidation. No effusion. Normal hilar, mediastinal and cardiac contours. IMPRESSION: Hyperinflation and mild chronic interstitial coarsening. No acute cardiopulmonary findings. Electronically Signed   By: Andreas Newport M.D.   On: 01/19/2017 00:44    Cardiac  Studies   CORONARY STENT INTERVENTION  12/31/16  Conclusion     Prox RCA to Dist RCA lesion, 100 %stenosed.  Ost LAD to Prox LAD lesion, 100 %stenosed.  Ost Ramus to Ramus lesion, 30 %stenosed.  Prox Cx to Mid Cx lesion, 100 %stenosed.  Origin to Prox Graft lesion, 95 %stenosed.  Mid Graft to Dist Graft lesion, 75 %stenosed.  And is normal in caliber.  LIMA and is normal in caliber.  A STENT RESOLUTE ONYX [id number] drug eluting stent was successfully placed.  1st Diag lesion, 90 %stenosed.  Post intervention, there is a 0% residual stenosis.  A STENT RESOLUTE ONYX [id number] drug eluting stent was successfully placed.  Post Atrio lesion, 95 %stenosed.  Post intervention, there is a 0% residual stenosis.   Selective angiography into the SVG which supplied the RCA revealed that this was a sequential graft anastomosing into the distal RCA continuation branch and the mid PDA.  There was a 95% focal  stenosis in the distal RCA continuation branch prior to the PLA takeoff.  Successful PCI to the distal RCA through the saphenous vein graft of the 95% continuation branch stenosis with  insertion of a 2.7518 mm Resolute Onyx DES stent postdilated to 2.8 mm with the 95% stenosis being reduced to 0%.  Successful PCI through the SVG supplying the diagonal (ramus intermediate, like) vessel with ultimate insertion of a 2.522 mm stent deployed at the distal tip of the graft extending into the diagonal vessel with the 90% stenoses being reduced to 0% with post stent dilatation up to 2.7 mm.  RECOMMENDATION: The patient should continue with dual antiplatelet therapy indefinitely, particularly with his high-grade stenosis in the graft supplying the circumflex.  Medical therapy for concomitant CAD.  High potency statin therapy.     Patient Profile     Omar Benson is a 82 y.o. male with PMH of CAD s/p CABG, CKD stage III, atrial flutter s/p ablation and afib, HTN and COPD presented with chest pain.   Cardiac catheterization on 12/30/2016 which showed 100% proximal RCA, 100% ostial LAD, patent LIMA to LAD, 100% proximal left circumflex with severely diseased proximal and distal SVG to OM, 75% SVG to diagonal, proximal part of SVG to PDA/PLA were patent, however unable to visualize the entire graft. He returned to the cath lab on the following day and underwent drug-eluting stent to distal RCA prior to PLA takeoff. He also underwent successful PCI with drug-eluting stent to SVG to diagonal.  Assessment & Plan    1. Chest pain with hx of CAD s/p CABG and recent stenting x 2 as above - Did not improved with SL nitro leading to ER evaluation. Her chest pain free IV morphine. CP reproducible with palpation. He did used blower yesterday.  - Continue current medication. He can eat. EKG without acute ischemic changes. Dr. Percival Spanish to see later today.    2. CKD, stage III - Baseline Creatinine of  1.5-1.6  3. HTN - BP intermittently elevated   4. PAF - no reoccurrence. Not on systemic anticoagulation due to prior GI bleed.   5. HLD - 12/30/2016: Cholesterol 151; HDL 36; LDL Cholesterol 82; Triglycerides 164; VLDL 33  - On zetia.   Signed, Leanor Kail, PA  01/19/2017, 10:17 AM
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
## 2017-01-19 NOTE — ED Triage Notes (Addendum)
Pt c/o centralized chest pain onset at 1400; has taken two nitros and two tylenol without relief. Recently had two stents placed 8/16. Pain has been intermittent since stent placement. Denies sob or radiation. Pt took [place] at 2200 and last took nitro at 2345

## 2017-01-19 NOTE — Progress Notes (Signed)
PROGRESS NOTE    Omar Benson  WPY:[id number] DOB: 10-Sep-1934 DOA: 01/19/2017 PCP: Biagio Borg, MD    Brief Narrative:  82 year old male who presents with chest pain. Patient is known to have coronary artery disease status post coronary artery bypass grafting in 2013, recent revascularization, August 2018, he underwent successful PCI to the distal RCA anastomosis with a drug-eluting stent, PCI in the saphenous vein graft to the diagonal with drug-eluting stent. He also has chronic kidney disease, hypothyroidism, atrial flutter and history of GI bleeding.   He developed acute chest pain, precordial, and sharp in nature, lasting for about 10 hours, occurred while he was at rest, no associated symptoms, no radiation, no significant improvement or worsening factors, not relieved by nitroglycerin. Due to persistent symptoms he presented to the hospital for further evaluation.  Patient at home has been physically active, no angina, no claudication, no PND or orthopnea. On his initial physical examination blood pressure 149/81, heart rate 60, respiratory 16, temperature 97.8, oxygen saturation 97%. Moist mucous membranes, lungs were clear to auscultation bilaterally, heart S1-S2 present rhythmic, the abdomen was soft nontender, lower extremity with no edema, no reproducible chest pain. Cardiac enzymes were negative, his EKG showed sinus bradycardia, left axis deviation, right bundle branch block, positive premature ventricular complexes. No ST elevations or ST depressions, no significant T wave abnormalities. No changes compared to old EKGs. Chest x-ray with increased lung markings bilaterally, right hemidiaphragm elevation.   Patient was admitted to the hospital with working diagnosis of atypical chest pain, to rule out acute coronary syndrome.  Assessment & Plan:   Active Problems:   Hypertension   Coronary atherosclerosis, a CABG (2004) b. PCI with DES to RCA through SVG and PCI with DES to SVG  ATRIAL FLUTTER, CHRONIC   Hypothyroid   COPD GOLD 0    Chest pain   1. Atypical chest pain. Patient was admitted to the stepdown unit, currently chest pain-free, will continue dual antiplatelet therapy with aspirin and ticagrelor, isosorbide and as needed nitroglycerin, statin therapy with Zetia. Will continue to trend cardiac enzymes, do serial EKGs, if patient rules out for acute coronary syndrome, possible discharge in the morning. Will add a muscle relaxant for pain control.  2. Paroxysmal atrial flutter. Currently sinus rhythm, continue amiodarone, not on anticoagulation due to history of GI bleed.  3. Chronic kidney disease, stage III. Serum creatinine at its baseline, potassium 4.5, serum bicarbonate 21. We'll avoid nephrotoxic agents.   4. Hypothyroidism. Continue levothyroxine.  5. COPD. Continue to troponin. Stable with no signs of exacerbation.   DVT prophylaxis:  Code Status:  Family Communication:  Disposition Plan:    Consultants:   Cardiology   Procedures:     Antimicrobials:       Subjective: Patient chest pain free, no nausea or vomiting, no dyspnea.   Objective: Vitals:   01/19/17 1000 01/19/17 1015 01/19/17 1130 01/19/17 1200  BP: 135/83 (!) 142/76 (!) 147/76 139/81  Pulse: (!) 52 (!) 49 (!) 54 (!) 54  Resp: 19 14 16 14   Temp:      SpO2: 92% 97% 97% 94%   No intake or output data in the 24 hours ending 01/19/17 1228 There were no vitals filed for this visit.  Examination:  General: Not in pain or dyspnea Neurology: Awake and alert, non focal  E ENT: no pallor, no icterus, oral mucosa moist Cardiovascular: S1-S2 present, rhythmic, no gallops, rubs, or murmurs. No jugular venous distention, no lower extremity edema.  Pulmonary: vesicular breath sounds bilaterally, adequate air movement, no wheezing, rhonchi or rales. Gastrointestinal. Abdomen flat, no organomegaly, non tender, no rebound or guarding Skin. No rashes Musculoskeletal: no joint  deformities     Data Reviewed: I have personally reviewed following labs and imaging studies  CBC:  Recent Labs Lab 01/19/17 0015 01/19/17 0404  WBC 5.2 5.1  HGB 12.1* 11.1*  HCT 36.6* 33.4*  MCV 93.1 93.0  PLT 224 284   Basic Metabolic Panel:  Recent Labs Lab 01/19/17 0015 01/19/17 0404  NA 135  --   K 4.5  --   CL 106  --   CO2 21*  --   GLUCOSE 116*  --   BUN 21*  --   CREATININE 1.83* 1.79*  CALCIUM 9.6  --    GFR: Estimated Creatinine Clearance: 32.9 mL/min (A) (by C-G formula based on SCr of 1.79 mg/dL (H)). Liver Function Tests: No results for input(s): AST, ALT, ALKPHOS, BILITOT, PROT, ALBUMIN in the last 168 hours. No results for input(s): LIPASE, AMYLASE in the last 168 hours. No results for input(s): AMMONIA in the last 168 hours. Coagulation Profile: No results for input(s): INR, PROTIME in the last 168 hours. Cardiac Enzymes:  Recent Labs Lab 01/19/17 0404 01/19/17 0951  TROPONINI <0.03 <0.03   BNP (last 3 results) No results for input(s): PROBNP in the last 8760 hours. HbA1C: No results for input(s): HGBA1C in the last 72 hours. CBG:  Recent Labs Lab 01/19/17 0747  GLUCAP 101*   Lipid Profile: No results for input(s): CHOL, HDL, LDLCALC, TRIG, CHOLHDL, LDLDIRECT in the last 72 hours. Thyroid Function Tests: No results for input(s): TSH, T4TOTAL, FREET4, T3FREE, THYROIDAB in the last 72 hours. Anemia Panel:  Recent Labs  01/19/17 0618 01/19/17 0619  VITAMINB12 307  --   FOLATE  --  25.0  FERRITIN 85  --   TIBC 300  --   IRON 52  --   RETICCTPCT 0.6  --       Radiology Studies: I have reviewed all of the imaging during this hospital visit personally     Scheduled Meds: . allopurinol  300 mg Oral QHS  . amiodarone  100 mg Oral QHS  . aspirin EC  81 mg Oral QHS  . enoxaparin (LOVENOX) injection  40 mg Subcutaneous Daily  . ezetimibe  10 mg Oral Daily  . hydrALAZINE  37.5 mg Oral BID  . isosorbide mononitrate  30  mg Oral Daily  . levothyroxine  125 mcg Oral QAC breakfast  . magnesium oxide  400 mg Oral QHS  . multivitamin with minerals  1 tablet Oral QHS  . pantoprazole  40 mg Oral QHS  . ticagrelor  90 mg Oral BID   Continuous Infusions:   LOS: 0 days       Kaori Jumper Gerome Apley, MD [facility] Pager [phone]
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
## 2017-01-20 DIAGNOSIS — N529 Male erectile dysfunction, unspecified: Secondary | ICD-10-CM | POA: Diagnosis not present

## 2017-01-20 DIAGNOSIS — I2510 Atherosclerotic heart disease of native coronary artery without angina pectoris: Secondary | ICD-10-CM | POA: Diagnosis not present

## 2017-01-20 DIAGNOSIS — I4892 Unspecified atrial flutter: Secondary | ICD-10-CM

## 2017-01-20 DIAGNOSIS — I129 Hypertensive chronic kidney disease with stage 1 through stage 4 chronic kidney disease, or unspecified chronic kidney disease: Secondary | ICD-10-CM | POA: Diagnosis not present

## 2017-01-20 DIAGNOSIS — I700 Atherosclerosis of aorta: Secondary | ICD-10-CM | POA: Diagnosis not present

## 2017-01-20 DIAGNOSIS — I495 Sick sinus syndrome: Secondary | ICD-10-CM | POA: Diagnosis not present

## 2017-01-20 DIAGNOSIS — E038 Other specified hypothyroidism: Secondary | ICD-10-CM

## 2017-01-20 DIAGNOSIS — R0789 Other chest pain: Secondary | ICD-10-CM | POA: Diagnosis not present

## 2017-01-20 DIAGNOSIS — R072 Precordial pain: Secondary | ICD-10-CM | POA: Diagnosis not present

## 2017-01-20 DIAGNOSIS — N183 Chronic kidney disease, stage 3 (moderate): Secondary | ICD-10-CM | POA: Diagnosis not present

## 2017-01-20 DIAGNOSIS — I4510 Unspecified right bundle-branch block: Secondary | ICD-10-CM | POA: Diagnosis not present

## 2017-01-20 DIAGNOSIS — I480 Paroxysmal atrial fibrillation: Secondary | ICD-10-CM | POA: Diagnosis not present

## 2017-01-20 DIAGNOSIS — E039 Hypothyroidism, unspecified: Secondary | ICD-10-CM | POA: Diagnosis not present

## 2017-01-20 DIAGNOSIS — I739 Peripheral vascular disease, unspecified: Secondary | ICD-10-CM | POA: Diagnosis not present

## 2017-01-20 DIAGNOSIS — I10 Essential (primary) hypertension: Secondary | ICD-10-CM | POA: Diagnosis not present

## 2017-01-20 LAB — BASIC METABOLIC PANEL
ANION GAP: 7 (ref 5–15)
BUN: 16 mg/dL (ref 6–20)
CO2: 24 mmol/L (ref 22–32)
Calcium: 9.3 mg/dL (ref 8.9–10.3)
Chloride: 107 mmol/L (ref 101–111)
Creatinine, Ser: 1.67 mg/dL — ABNORMAL HIGH (ref 0.61–1.24)
GFR calc Af Amer: 43 mL/min — ABNORMAL LOW (ref >60)
GFR, EST NON AFRICAN AMERICAN: 37 mL/min — AB (ref >60)
GLUCOSE: 96 mg/dL (ref 65–99)
POTASSIUM: 4.2 mmol/L (ref 3.5–5.1)
SODIUM: 138 mmol/L (ref 135–145)

## 2017-01-20 MED ORDER — ISOSORBIDE MONONITRATE ER 60 MG PO TB24: 60.0000 mg | ORAL_TABLET | Freq: Every day | ORAL | 0 refills | Status: DC

## 2017-01-20 MED ORDER — ISOSORBIDE MONONITRATE ER 60 MG PO TB24: 60.0000 mg | ORAL_TABLET | Freq: Every day | ORAL | Status: DC

## 2017-01-20 NOTE — Discharge Summary (Signed)
Physician Discharge Summary  Omar Benson JJK:[id number] DOB: 04-24-1935 DOA: 01/19/2017  PCP: Biagio Borg, MD  Admit date: 01/19/2017 Discharge date: 01/20/2017  Time spent: greater than 30 minutes  Recommendations for Outpatient Follow-up:  1. Follow up with Dr. Debara Pickett 2. Follow up repeat BMP and CBC within 1 week   Discharge Diagnoses:  Active Problems:   Hypertension   Coronary atherosclerosis, a CABG (2004) b. PCI with DES to RCA through SVG and PCI with DES to SVG   ATRIAL FLUTTER, CHRONIC   Hypothyroid   COPD GOLD 0    Chest pain   Discharge Condition: stable  Diet recommendation: heart healthy  There were no vitals filed for this visit.  History of present illness:  82 year old male who presents with chest pain. Patient is known to have coronary artery disease status post coronary artery bypass grafting in 2013, recent revascularization, August 2018, he underwent successful PCI to the distal RCA anastomosis with a drug-eluting stent, PCI in the saphenous vein graft to the diagonal with drug-eluting stent. He also has chronic kidney disease, hypothyroidism, atrial flutter and history of GI bleeding.   He developed acute chest pain, precordial, and sharp in nature, lasting for about 10 hours, occurred while he was at rest, no associated symptoms, no radiation, no significant improvement or worsening factors, not relieved by nitroglycerin. Due to persistent symptoms he presented to the hospital for further evaluation.  Patient at home has been physically active, no angina, no claudication, no PND or orthopnea. On his initial physical examination blood pressure 149/81, heart rate 60, respiratory 16, temperature 97.8, oxygen saturation 97%. Moist mucous membranes, lungs were clear to auscultation bilaterally, heart S1-S2 present rhythmic, the abdomen was soft nontender, lower extremity with no edema, no reproducible chest pain. Cardiac enzymes were negative, his EKG showed sinus  bradycardia, left axis deviation, right bundle branch block, positive premature ventricular complexes. No ST elevations or ST depressions, no significant T wave abnormalities. No changes compared to old EKGs. Chest x-ray with increased lung markings bilaterally, right hemidiaphragm elevation.   Patient was admitted to the hospital with working diagnosis of atypical chest pain, to rule out acute coronary syndrome.  Patient had negative troponins and no recurrent CP.  He was seen by cardiology who increased imdur and planned for follow up with Dr. Debara Pickett.    Hospital Course:  1. Atypical chest pain. Patient was admitted to the stepdown unit, remained CP free.  His dual antiplatelet therapy with aspirin and ticagrelor, isosorbide and as needed nitroglycerin, statin therapy with Zetia were continued. Cardiac enzymes were negative.  He did have some T wave changes on repeat EKG at d/c, but this was similar to 8/28 EKG (discussed with cards who also felt ok for d/c). - he'll f/u with Dr. Debara Pickett with cardiology - imdur increased to 60 mg daily  2. Paroxysmal atrial flutter. Currently sinus rhythm, continue amiodarone, not on anticoagulation due to history of GI bleed.  3. Chronic kidney disease, stage III. Serum creatinine at its baseline, potassium 4.5, serum bicarbonate 21.   4. Hypothyroidism. levothyroxine.  5. COPD. Stable with no signs of exacerbation.   Procedures:  none (i.e. Studies not automatically included, echos, thoracentesis, etc; not x-rays)  Consultations:  cardiology  Discharge Exam: Vitals:   01/20/17 1133 01/20/17 1426  BP: 118/76 105/63  Pulse: (!) 55 (!) 58  Resp: 20 (!) 21  Temp: 97.8 F (36.6 C) (!) 97.1 F (36.2 C)  SpO2: 95% 95%  General: No acute distress. Cardiovascular: Heart sounds show a regular rate, and rhythm. No gallops or rubs. No murmurs. No JVD. Lungs: Clear to auscultation bilaterally with good air movement. No rales, rhonchi or  wheezes. Abdomen: Soft, nontender, nondistended with normal active bowel sounds. No masses. No hepatosplenomegaly. Neurological: Alert and oriented 3. Moves all extremities 4 with equal strength. Cranial nerves II through XII grossly intact. Skin: Warm and dry. No rashes or lesions. Extremities: No clubbing or cyanosis. No edema. Pedal pulses 2+. Psychiatric: Mood and affect are normal. Insight and judgment are appropriate.   Discharge Instructions   Discharge Instructions    Call MD for:    Complete by:  As directed    Call MD for:  difficulty breathing, headache or visual disturbances    Complete by:  As directed    Call MD for:  persistant dizziness or light-headedness    Complete by:  As directed    Call MD for:  persistant nausea and vomiting    Complete by:  As directed    Call MD for:  severe uncontrolled pain    Complete by:  As directed    Call MD for:  temperature >100.4    Complete by:  As directed    Diet - low sodium heart healthy    Complete by:  As directed    Discharge instructions    Complete by:  As directed    You were seen in the hospital for an episode of chest pain.  Your workup was negative.  Dr. Percival Spanish increased your imdur to 60 mg daily.  Please follow up with Dr. Debara Pickett (they should call you for an appointment).  Please follow up with your pcp within the next week.   Increase activity slowly    Complete by:  As directed      Current Discharge Medication List    CONTINUE these medications which have CHANGED   Details  isosorbide mononitrate (IMDUR) 60 MG 24 hr tablet Take 1 tablet (60 mg total) by mouth daily. Qty: 30 tablet, Refills: 0      CONTINUE these medications which have NOT CHANGED   Details  allopurinol (ZYLOPRIM) 300 MG tablet Take 1 tablet (300 mg total) by mouth daily. Qty: 90 tablet, Refills: 3    amiodarone (PACERONE) 200 MG tablet Take 0.5 tablets (100 mg total) by mouth daily. Qty: 45 tablet, Refills: 3   Associated  Diagnoses: Paroxysmal atrial fibrillation (HCC)    aspirin 81 MG tablet Take 1 tablet (81 mg total) by mouth at bedtime. Qty: 30 tablet, Refills: 11    b complex vitamins tablet Take 1 tablet by mouth at bedtime.     Cholecalciferol (VITAMIN D) 2000 UNITS CAPS Take 2,000 Units by mouth at bedtime.     EPINEPHrine (EPIPEN) 0.3 mg/0.3 mL SOAJ injection Inject 0.3 mLs (0.3 mg total) into the muscle once. Qty: 2 Device, Refills: 2   Associated Diagnoses: Bee sting, initial encounter    ezetimibe (ZETIA) 10 MG tablet Take 1 tablet (10 mg total) by mouth daily. Qty: 30 tablet, Refills: 11    hydrALAZINE (APRESOLINE) 25 MG tablet Take 1.5 tablets (37.5 mg total) by mouth 2 (two) times daily. Qty: 90 tablet, Refills: 11    levothyroxine (SYNTHROID, LEVOTHROID) 125 MCG tablet Take 1 tablet (125 mcg total) by mouth daily. Qty: 90 tablet, Refills: 3    Magnesium 250 MG TABS Take 250 mg by mouth at bedtime.    Multiple Vitamin (MULTIVITAMIN WITH MINERALS)  TABS tablet Take 1 tablet by mouth at bedtime.    neomycin-bacitracin-polymyxin (NEOSPORIN) OINT Apply 1 application topically daily as needed for wound care.    nitroGLYCERIN (NITROSTAT) 0.4 MG SL tablet Place 1 tablet (0.4 mg total) under the tongue every 5 (five) minutes as needed for chest pain. Qty: 25 tablet, Refills: 2    pantoprazole (PROTONIX) 40 MG tablet Take 1 tablet (40 mg total) by mouth daily. Yearly physical due in February must see MD for refills Qty: 90 tablet, Refills: 3    ticagrelor (BRILINTA) 90 MG TABS tablet Take 1 tablet (90 mg total) by mouth 2 (two) times daily. Qty: 60 tablet, Refills: 0    tiotropium (SPIRIVA HANDIHALER) 18 MCG inhalation capsule PLACE 1 CAPSULE (18 MCG TOTAL) INTO INHALER AND INHALE DAILY. Qty: 90 capsule, Refills: 3    vitamin C (ASCORBIC ACID) 500 MG tablet Take 500 mg by mouth at bedtime.        Allergies  Allergen Reactions  . Fish Allergy Diarrhea and Nausea And Vomiting  .  Promethazine Hcl Other (See Comments)    Syncope (reaction to phenergan)  . Shellfish Allergy Diarrhea and Nausea And Vomiting  . Yellow Jacket Venom [Bee Venom] Swelling    Localized swelling from yellow jackets and honey bees  . Doxycycline Hyclate Other (See Comments)    esophagitis  . Lipitor [Atorvastatin Calcium] Other (See Comments)    Myalgia/myopathy  . Tetracycline Other (See Comments)    Esophagitis  . Crestor [Rosuvastatin] Other (See Comments)    Myalgia/myopathy  . Tetanus Toxoid Swelling and Rash   Follow-up Information    Hilty, Nadean Corwin, MD Follow up.   Specialty:  Cardiology Why:  office will call with time and date Contact information: [place] [address] [phone]        Biagio Borg, MD Follow up.   Specialties:  Internal Medicine, Radiology Contact information: [address] [address] [phone]            The results of significant diagnostics from this hospitalization (including imaging, microbiology, ancillary and laboratory) are listed below for reference.    Significant Diagnostic Studies: Dg Chest 2 View  Result Date: 01/19/2017 CLINICAL DATA:  Chest pain tonight. EXAM: CHEST  2 VIEW COMPARISON:  12/29/2016 FINDINGS: Moderate hyperinflation, unchanged. Mild chronic interstitial coarsening. No edema. No consolidation. No effusion. Normal hilar, mediastinal and cardiac contours. IMPRESSION: Hyperinflation and mild chronic interstitial coarsening. No acute cardiopulmonary findings. Electronically Signed   By: Andreas Newport M.D.   On: 01/19/2017 00:44   Dg Chest 2 View  Result Date: 12/29/2016 CLINICAL DATA:  Chest pain. EXAM: CHEST  2 VIEW COMPARISON:  Chest x-ray dated December 19, 2016. FINDINGS: Prior CABG. The cardiomediastinal silhouette is normal in size. Normal pulmonary vascularity. Atherosclerotic calcification of the aortic arch. Bibasilar atelectasis. No focal consolidation,  pleural effusion, or pneumothorax. No acute osseous abnormality. IMPRESSION: No active cardiopulmonary disease. Electronically Signed   By: Titus Dubin M.D.   On: 12/29/2016 16:33    Microbiology: No results found for this or any previous visit (from the past 240 hour(s)).   Labs: Basic Metabolic Panel:  Recent Labs Lab 01/19/17 0015 01/19/17 0404 01/20/17 0327  NA 135  --  138  K 4.5  --  4.2  CL 106  --  107  CO2 21*  --  24  GLUCOSE 116*  --  96  BUN 21*  --  16  CREATININE 1.83* 1.79* 1.67*  CALCIUM 9.6  --  9.3   Liver Function Tests: No results for input(s): AST, ALT, ALKPHOS, BILITOT, PROT, ALBUMIN in the last 168 hours. No results for input(s): LIPASE, AMYLASE in the last 168 hours. No results for input(s): AMMONIA in the last 168 hours. CBC:  Recent Labs Lab 01/19/17 0015 01/19/17 0404  WBC 5.2 5.1  HGB 12.1* 11.1*  HCT 36.6* 33.4*  MCV 93.1 93.0  PLT 224 209   Cardiac Enzymes:  Recent Labs Lab 01/19/17 0404 01/19/17 0951 01/19/17 1519  TROPONINI <0.03 <0.03 <0.03   BNP: BNP (last 3 results) No results for input(s): BNP in the last 8760 hours.  ProBNP (last 3 results) No results for input(s): PROBNP in the last 8760 hours.  CBG:  Recent Labs Lab 01/19/17 0747 01/19/17 1649  GLUCAP 101* 139*       Signed:  A Melven Sartorius MD.  [facility] 01/20/2017, 4:20 PM

## 2017-01-20 NOTE — Progress Notes (Signed)
Progress Note  Patient Name: Omar Benson Date of Encounter: 01/20/2017  Primary Cardiologist: New to Dr. Debara Pickett EP : Dr. Lovena Le  Subjective   Feeling well. No chest pain, sob or palpitations.   Inpatient Medications    Scheduled Meds: . allopurinol  300 mg Oral QHS  . amiodarone  100 mg Oral QHS  . aspirin EC  81 mg Oral QHS  . enoxaparin (LOVENOX) injection  40 mg Subcutaneous Daily  . ezetimibe  10 mg Oral Daily  . hydrALAZINE  37.5 mg Oral BID  . isosorbide mononitrate  30 mg Oral Daily  . levothyroxine  125 mcg Oral QAC breakfast  . magnesium oxide  400 mg Oral QHS  . multivitamin with minerals  1 tablet Oral QHS  . pantoprazole  40 mg Oral QHS  . ticagrelor  90 mg Oral BID   Continuous Infusions:  PRN Meds: acetaminophen, cyclobenzaprine, morphine injection, nitroGLYCERIN, ondansetron (ZOFRAN) IV, tiotropium   Vital Signs    Vitals:   01/19/17 2011 01/19/17 2240 01/20/17 0428 01/20/17 1133  BP:  (!) 157/95 123/75 118/76  Pulse: (!) 58 (!) 58 (!) 53 (!) 55  Resp: (!) 23 (!) 23 12 20   Temp:  97.8 F (36.6 C) 98.3 F (36.8 C) 97.8 F (36.6 C)  TempSrc:  Oral Oral Oral  SpO2: 97% 97% 96% 95%  Height:   5\' 9"  (1.753 m)     Intake/Output Summary (Last 24 hours) at 01/20/17 1234 Last data filed at 01/20/17 0800  Gross per 24 hour  Intake              480 ml  Output                0 ml  Net              480 ml   There were no vitals filed for this visit.  Telemetry    SR  - Personally Reviewed  ECG    Sinus rhythm with RBBB - Personally Reviewed  Physical Exam   GEN: No acute distress.   Neck: No JVD Cardiac: RRR, no murmurs, rubs, or gallops.  Respiratory: Clear to auscultation bilaterally. GI: Soft, nontender, non-distended  MS: No edema; No deformity. Neuro:  Nonfocal  Psych: Normal affect   Labs    Chemistry Recent Labs Lab 01/19/17 0015 01/19/17 0404 01/20/17 0327  NA 135  --  138  K 4.5  --  4.2  CL 106  --  107  CO2 21*  --   24  GLUCOSE 116*  --  96  BUN 21*  --  16  CREATININE 1.83* 1.79* 1.67*  CALCIUM 9.6  --  9.3  GFRNONAA 33* 34* 37*  GFRAA 38* 40* 43*  ANIONGAP 8  --  7     Hematology Recent Labs Lab 01/19/17 0015 01/19/17 0404 01/19/17 0618  WBC 5.2 5.1  --   RBC 3.93* 3.59* 3.63*  HGB 12.1* 11.1*  --   HCT 36.6* 33.4*  --   MCV 93.1 93.0  --   MCH 30.8 30.9  --   MCHC 33.1 33.2  --   RDW 14.0 13.9  --   PLT 224 209  --     Cardiac Enzymes Recent Labs Lab 01/19/17 0404 01/19/17 0951 01/19/17 1519  TROPONINI <0.03 <0.03 <0.03    Recent Labs Lab 01/19/17 0036  TROPIPOC 0.00     BNPNo results for input(s): BNP, PROBNP in the last  168 hours.   DDimer No results for input(s): DDIMER in the last 168 hours.   Radiology    Dg Chest 2 View  Result Date: 01/19/2017 CLINICAL DATA:  Chest pain tonight. EXAM: CHEST  2 VIEW COMPARISON:  12/29/2016 FINDINGS: Moderate hyperinflation, unchanged. Mild chronic interstitial coarsening. No edema. No consolidation. No effusion. Normal hilar, mediastinal and cardiac contours. IMPRESSION: Hyperinflation and mild chronic interstitial coarsening. No acute cardiopulmonary findings. Electronically Signed   By: Andreas Newport M.D.   On: 01/19/2017 00:44    Cardiac Studies   None this admission   Patient Profile     Meet Omar Deanis a 82 y.o.malewith PMH of CAD s/p CABG, CKD stage III, atrial flutter s/p ablation and afib, HTN and COPD presented with chest pain.   Cardiac catheterization on 12/30/2016 which showed 100% proximal RCA, 100% ostial LAD, patent LIMA to LAD, 100% proximal left circumflex with severely diseased proximal and distal SVG to OM, 75% SVG to diagonal, proximal part of SVG to PDA/PLA were patent, however unable to visualize the entire graft. He returned to the cath lab on the following day and underwent drug-eluting stent to distal RCA prior to [place]. He also underwent successful PCI with drug-eluting stent to SVG to  diagonal.  Assessment & Plan    1. Chest pain with hx of CAD s/p CABG and recent stenting x 2 as above - Did not improved with SL nitro leading to ER evaluation. Her chest pain free IV morphine. CP reproducible with palpation. He did used blower yesterday.  EKG without acute ischemic changes. No recurrent chest pain. Ambulate.  - Discharge home later today once seen by Dr. Percival Spanish (review discharge medications). Has appointment with Dr. Debara Pickett 04/16/17.   3. HTN - BP stable  3. PAF - no reoccurrence. Not on systemic anticoagulation due to prior GI bleed.   4. HLD - 12/30/2016: Cholesterol 151; HDL 36; LDL Cholesterol 82; Triglycerides 164; VLDL 33  - On zetia.   Signed, Leanor Kail, PA  01/20/2017, 12:34 PM    History and all data above reviewed.  Patient examined.  I agree with the findings as above.  No chest pain this morning.  The patient exam reveals COR:RRR  ,  Lungs: Clear  ,  Abd: Positive bowel sounds, no rebound no guarding, Ext No edema  .  All available labs, radiology testing, previous records reviewed. Agree with documented assessment and plan. Chest pain:  Atypical.  OK to discharge.  Medical management.  I increased the Imdur.    Jeneen Rinks Fordyce Lepak  1:34 PM  01/20/2017

## 2017-01-21 ENCOUNTER — Telehealth (HOSPITAL_COMMUNITY): Payer: Self-pay

## 2017-01-21 NOTE — Telephone Encounter (Signed)
I called and left message on voicemail to call office about scheduling for cardiac rehab. I left office contact information on patient voicemail to return call.  ° °

## 2017-01-22 ENCOUNTER — Telehealth: Payer: Self-pay | Admitting: *Deleted

## 2017-01-22 NOTE — Telephone Encounter (Signed)
Pt was on TCM list admitted 01/19/17 for CP & Hypertension to r/o acute coronary syndrome. Pt was D/C 01/20/17, and will be following up w/cardiology Dr. Debara Pickett on 02/08/17...Johny Chess

## 2017-01-25 DIAGNOSIS — I700 Atherosclerosis of aorta: Secondary | ICD-10-CM | POA: Diagnosis not present

## 2017-01-25 DIAGNOSIS — I739 Peripheral vascular disease, unspecified: Secondary | ICD-10-CM | POA: Diagnosis not present

## 2017-01-25 DIAGNOSIS — N2581 Secondary hyperparathyroidism of renal origin: Secondary | ICD-10-CM | POA: Diagnosis not present

## 2017-01-25 DIAGNOSIS — I2510 Atherosclerotic heart disease of native coronary artery without angina pectoris: Secondary | ICD-10-CM | POA: Diagnosis not present

## 2017-01-25 DIAGNOSIS — N183 Chronic kidney disease, stage 3 (moderate): Secondary | ICD-10-CM | POA: Diagnosis not present

## 2017-01-25 DIAGNOSIS — D509 Iron deficiency anemia, unspecified: Secondary | ICD-10-CM | POA: Diagnosis not present

## 2017-01-25 DIAGNOSIS — J449 Chronic obstructive pulmonary disease, unspecified: Secondary | ICD-10-CM | POA: Diagnosis not present

## 2017-01-25 DIAGNOSIS — Z6827 Body mass index (BMI) 27.0-27.9, adult: Secondary | ICD-10-CM | POA: Diagnosis not present

## 2017-02-03 ENCOUNTER — Telehealth: Payer: Self-pay | Admitting: Physician Assistant

## 2017-02-03 NOTE — Telephone Encounter (Signed)
Received incoming records from [facility] for upcoming appointment on 02/09/17 @ 1:30pm with Almyra Deforest. Records given to [facility] in Medical Records. 02/03/17 ab

## 2017-02-05 ENCOUNTER — Ambulatory Visit (INDEPENDENT_AMBULATORY_CARE_PROVIDER_SITE_OTHER): Payer: Medicare Other | Admitting: Internal Medicine

## 2017-02-05 ENCOUNTER — Encounter: Payer: Self-pay | Admitting: Internal Medicine

## 2017-02-05 VITALS — BP 114/66 | HR 57 | Temp 97.9°F | Ht 69.0 in | Wt 181.0 lb

## 2017-02-05 DIAGNOSIS — K5792 Diverticulitis of intestine, part unspecified, without perforation or abscess without bleeding: Secondary | ICD-10-CM

## 2017-02-05 DIAGNOSIS — N183 Chronic kidney disease, stage 3 unspecified: Secondary | ICD-10-CM

## 2017-02-05 DIAGNOSIS — I10 Essential (primary) hypertension: Secondary | ICD-10-CM

## 2017-02-05 DIAGNOSIS — I200 Unstable angina: Secondary | ICD-10-CM | POA: Diagnosis not present

## 2017-02-05 DIAGNOSIS — R079 Chest pain, unspecified: Secondary | ICD-10-CM

## 2017-02-05 MED ORDER — AMOXICILLIN-POT CLAVULANATE 875-125 MG PO TABS: 1.0000 | ORAL_TABLET | Freq: Two times a day (BID) | ORAL | 0 refills | Status: DC

## 2017-02-05 NOTE — Assessment & Plan Note (Signed)
Incidental today, but significant, declines lab, imaging or rocephin IM, but will accept augmentin that has worked 3 times in the past.  Eliott Nine to go to [facility] for any worsening pain, fever, weakness.

## 2017-02-05 NOTE — Assessment & Plan Note (Signed)
.  stable overall by history and exam, recent data reviewed with pt, and pt to continue medical treatment as before,  to f/u any worsening symptoms or concerns Lab Results  Component Value Date   CREATININE 1.67 (H) 01/20/2017

## 2017-02-05 NOTE — Patient Instructions (Signed)
Please take all new medication as prescribed - the augmentin  Please continue all other medications as before, and refills have been done if requested.  Please have the pharmacy call with any other refills you may need.  Please continue your efforts at being more active, low cholesterol diet, and weight control.  You are otherwise up to date with prevention measures today.  Please keep your appointments with your specialists as you may have planned

## 2017-02-05 NOTE — Assessment & Plan Note (Signed)
stable overall by history and exam, recent data reviewed with pt, and pt to continue medical treatment as before,  to f/u any worsening symptoms or concerns BP Readings from Last 3 Encounters:  02/05/17 114/66  01/20/17 105/63  01/12/17 122/70
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
## 2017-02-05 NOTE — Assessment & Plan Note (Addendum)
None on increased imdur and brillinta, for f/u cardioilogy as planned  Note:  Total time for pt hx, exam, review of record with pt in the room, determination of diagnoses and plan for further eval and tx is > 40 min, with over 50% spent in coordination and counseling of patient including the differential dx, tx, further evaluation and other management of acute diverticulitis, CP, CAD, HTN and CKD

## 2017-02-09 ENCOUNTER — Ambulatory Visit (INDEPENDENT_AMBULATORY_CARE_PROVIDER_SITE_OTHER): Payer: Medicare Other | Admitting: Physician Assistant

## 2017-02-09 ENCOUNTER — Encounter: Payer: Self-pay | Admitting: Physician Assistant

## 2017-02-09 VITALS — BP 128/58 | HR 56 | Ht 69.0 in | Wt 180.0 lb

## 2017-02-09 DIAGNOSIS — I25810 Atherosclerosis of coronary artery bypass graft(s) without angina pectoris: Secondary | ICD-10-CM

## 2017-02-09 DIAGNOSIS — J449 Chronic obstructive pulmonary disease, unspecified: Secondary | ICD-10-CM

## 2017-02-09 DIAGNOSIS — E785 Hyperlipidemia, unspecified: Secondary | ICD-10-CM

## 2017-02-09 DIAGNOSIS — I10 Essential (primary) hypertension: Secondary | ICD-10-CM

## 2017-02-09 DIAGNOSIS — I480 Paroxysmal atrial fibrillation: Secondary | ICD-10-CM

## 2017-02-09 DIAGNOSIS — N183 Chronic kidney disease, stage 3 unspecified: Secondary | ICD-10-CM

## 2017-02-09 DIAGNOSIS — I200 Unstable angina: Secondary | ICD-10-CM

## 2017-02-09 NOTE — Patient Instructions (Signed)
Medication Instructions:  Continue current medications  If you need a refill on your cardiac medications before your next appointment, please call your pharmacy.  Labwork: Fasting Lipid Liver in 1 Month HERE IN OUR OFFICE AT [facility]  Testing/Procedures: None Ordered  Follow-Up: Your physician wants you to follow-up in: Keep appointment with Dr Debara Pickett November 30th @ 2:00 pm.   Thank you for choosing [facility] at [employer]!!
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
## 2017-02-10 ENCOUNTER — Encounter (HOSPITAL_COMMUNITY): Payer: Self-pay

## 2017-02-10 ENCOUNTER — Encounter: Payer: Self-pay | Admitting: Physician Assistant

## 2017-02-24 ENCOUNTER — Telehealth (HOSPITAL_COMMUNITY): Payer: Self-pay | Admitting: Pharmacist

## 2017-02-24 NOTE — Telephone Encounter (Signed)
Cardiac Rehab Medication Review by a Pharmacist  Does the patient feel that his/her medications are working for him/her?  yes  Has the patient been experiencing any side effects to the medications prescribed?  no  Does the patient measure his/her own blood pressure or blood glucose at home?  yes - not every day  Does the patient have any problems obtaining medications due to transportation or finances?   no  Understanding of regimen: good Understanding of indications: good Potential of compliance: good  Pharmacist comments: Thos is presenting to for cardiac rehab orientation. He does not report any changes to his medication list from that on file and was able to confirm each medication when prompted with the name. He did not report any issues or have any further questions.  Mila Merry Gerarda Fraction, PharmD PGY1 Pharmacy Resident Pager: [phone] 02/24/2017 3:50 PM

## 2017-02-26 ENCOUNTER — Encounter: Payer: Self-pay | Admitting: Family Medicine

## 2017-02-26 ENCOUNTER — Ambulatory Visit (INDEPENDENT_AMBULATORY_CARE_PROVIDER_SITE_OTHER): Payer: Medicare Other | Admitting: Family Medicine

## 2017-02-26 VITALS — BP 100/70 | HR 63 | Temp 98.0°F | Ht 69.0 in | Wt 181.3 lb

## 2017-02-26 DIAGNOSIS — R1032 Left lower quadrant pain: Secondary | ICD-10-CM

## 2017-02-26 DIAGNOSIS — Z8719 Personal history of other diseases of the digestive system: Secondary | ICD-10-CM

## 2017-02-26 DIAGNOSIS — I200 Unstable angina: Secondary | ICD-10-CM | POA: Diagnosis not present

## 2017-02-26 LAB — CBC
HCT: 38.3 % — ABNORMAL LOW (ref 39.0–52.0)
HEMOGLOBIN: 12.7 g/dL — AB (ref 13.0–17.0)
MCHC: 33.1 g/dL (ref 30.0–36.0)
MCV: 96.6 fl (ref 78.0–100.0)
PLATELETS: 234 10*3/uL (ref 150.0–400.0)
RBC: 3.97 Mil/uL — ABNORMAL LOW (ref 4.22–5.81)
RDW: 15.3 % (ref 11.5–15.5)
WBC: 9.4 10*3/uL (ref 4.0–10.5)

## 2017-02-26 LAB — POCT URINALYSIS DIPSTICK
BILIRUBIN UA: NEGATIVE
Blood, UA: NEGATIVE
Glucose, UA: NEGATIVE
Ketones, UA: NEGATIVE
LEUKOCYTES UA: NEGATIVE
NITRITE UA: NEGATIVE
PH UA: 6.5 (ref 5.0–8.0)
PROTEIN UA: NEGATIVE
Spec Grav, UA: 1.015 (ref 1.010–1.025)
Urobilinogen, UA: 0.2 E.U./dL

## 2017-02-26 LAB — COMPREHENSIVE METABOLIC PANEL
ALK PHOS: 113 U/L (ref 39–117)
ALT: 18 U/L (ref 0–53)
AST: 17 U/L (ref 0–37)
Albumin: 4.2 g/dL (ref 3.5–5.2)
BUN: 26 mg/dL — ABNORMAL HIGH (ref 6–23)
CO2: 25 mEq/L (ref 19–32)
Calcium: 9 mg/dL (ref 8.4–10.5)
Chloride: 103 mEq/L (ref 96–112)
Creatinine, Ser: 1.75 mg/dL — ABNORMAL HIGH (ref 0.40–1.50)
GFR: 40.01 mL/min — AB (ref >60.00)
GLUCOSE: 104 mg/dL — AB (ref 70–99)
POTASSIUM: 4.7 meq/L (ref 3.5–5.1)
Sodium: 137 mEq/L (ref 135–145)
TOTAL PROTEIN: 7.1 g/dL (ref 6.0–8.3)
Total Bilirubin: 1.2 mg/dL (ref 0.2–1.2)

## 2017-02-26 MED ORDER — AMOXICILLIN-POT CLAVULANATE 875-125 MG PO TABS: 1.0000 | ORAL_TABLET | Freq: Two times a day (BID) | ORAL | 0 refills | Status: DC

## 2017-02-26 NOTE — Patient Instructions (Signed)
BEFORE YOU LEAVE: -follow up: schedule him follow up with his PCP or PCP office next week -labs  Clear liquid diet for 1-2 days until feeling better, then pureed until symptoms resolved.  Avoid all red meat and dairy for 1 month.  Seek emergency care if worsening or not responding to treatment.
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
## 2017-03-02 ENCOUNTER — Ambulatory Visit (INDEPENDENT_AMBULATORY_CARE_PROVIDER_SITE_OTHER): Payer: Medicare Other | Admitting: Internal Medicine

## 2017-03-02 ENCOUNTER — Inpatient Hospital Stay (HOSPITAL_COMMUNITY): Admission: RE | Admit: 2017-03-02 | Payer: Medicare Other | Source: Ambulatory Visit

## 2017-03-02 ENCOUNTER — Encounter: Payer: Self-pay | Admitting: Internal Medicine

## 2017-03-02 VITALS — BP 138/72 | HR 57 | Temp 97.9°F | Ht 69.0 in | Wt 172.0 lb

## 2017-03-02 DIAGNOSIS — N183 Chronic kidney disease, stage 3 unspecified: Secondary | ICD-10-CM

## 2017-03-02 DIAGNOSIS — I200 Unstable angina: Secondary | ICD-10-CM | POA: Diagnosis not present

## 2017-03-02 DIAGNOSIS — I10 Essential (primary) hypertension: Secondary | ICD-10-CM | POA: Diagnosis not present

## 2017-03-02 DIAGNOSIS — K5792 Diverticulitis of intestine, part unspecified, without perforation or abscess without bleeding: Secondary | ICD-10-CM

## 2017-03-02 MED ORDER — ISOSORBIDE MONONITRATE ER 60 MG PO TB24: 60.0000 mg | ORAL_TABLET | Freq: Every day | ORAL | 3 refills | Status: DC

## 2017-03-02 MED ORDER — AMOXICILLIN-POT CLAVULANATE 875-125 MG PO TABS: 1.0000 | ORAL_TABLET | Freq: Two times a day (BID) | ORAL | 0 refills | Status: DC
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
## 2017-03-02 NOTE — Assessment & Plan Note (Signed)
stable overall by history and exam, recent data reviewed with pt, and pt to continue medical treatment as before,  to f/u any worsening symptoms or concerns BP Readings from Last 3 Encounters:  03/02/17 138/72  02/26/17 100/70  02/09/17 (!) 128/58  \

## 2017-03-02 NOTE — Patient Instructions (Signed)
Ok to finish a 10 day course of augmentin  Please return if not improved, as we may have to consider different antibiotic and/or CT scan  Please continue all other medications as before, and refills have been done if requested.  Please have the pharmacy call with any other refills you may need.  Please keep your appointments with your specialists as you may have planned

## 2017-03-02 NOTE — Assessment & Plan Note (Signed)
With presumed recurrence, seems improved on augmentin, will finish 10 days, but if pain persists or worsens or recurs I would ask for CT imaging; differential also includes acute prostatitis

## 2017-03-02 NOTE — Assessment & Plan Note (Signed)
stable overall by history and exam, recent data reviewed with pt, and pt to continue medical treatment as before,  to f/u any worsening symptoms or concerns Lab Results  Component Value Date   CREATININE 1.75 (H) 02/26/2017

## 2017-03-08 ENCOUNTER — Ambulatory Visit (HOSPITAL_COMMUNITY): Payer: Medicare Other

## 2017-03-09 ENCOUNTER — Other Ambulatory Visit: Payer: Self-pay | Admitting: *Deleted

## 2017-03-09 DIAGNOSIS — I714 Abdominal aortic aneurysm, without rupture, unspecified: Secondary | ICD-10-CM

## 2017-03-10 ENCOUNTER — Ambulatory Visit (HOSPITAL_COMMUNITY): Payer: Medicare Other

## 2017-03-12 ENCOUNTER — Ambulatory Visit (HOSPITAL_COMMUNITY): Payer: Medicare Other

## 2017-03-15 ENCOUNTER — Ambulatory Visit (HOSPITAL_COMMUNITY): Payer: Medicare Other

## 2017-03-17 ENCOUNTER — Ambulatory Visit (HOSPITAL_COMMUNITY): Payer: Medicare Other

## 2017-03-17 ENCOUNTER — Ambulatory Visit (HOSPITAL_COMMUNITY)
Admission: RE | Admit: 2017-03-17 | Discharge: 2017-03-17 | Disposition: A | Discharge status: Home / Self Care | Payer: Medicare Other | Source: Ambulatory Visit | Attending: Cardiology | Admitting: Cardiology

## 2017-03-17 DIAGNOSIS — I714 Abdominal aortic aneurysm, without rupture, unspecified: Secondary | ICD-10-CM

## 2017-03-19 ENCOUNTER — Ambulatory Visit (HOSPITAL_COMMUNITY): Payer: Medicare Other

## 2017-03-19 ENCOUNTER — Telehealth: Payer: Self-pay

## 2017-03-19 DIAGNOSIS — I714 Abdominal aortic aneurysm, without rupture, unspecified: Secondary | ICD-10-CM

## 2017-03-19 NOTE — Addendum Note (Signed)
Addended by: Biagio Borg on: 03/19/2017 12:48 PM   Modules accepted: Orders

## 2017-03-19 NOTE — Telephone Encounter (Signed)
-----   Message from Biagio Borg, MD sent at 03/18/2017  5:20 PM EDT ----- Left message on MyChart, pt to cont same tx except  The test results show that your current treatment is OK, except there is evidence for mild worsening aortic aneurysm, and even another aneurysm just after the aorta near the right groin area.  I see you have been seeing Vascular Surgury in the past, but I am not sure you have any further follow up.  I will ask the office to call you to see if you already have Vascular Surg[facility] follow up planned.  If not, you will want to be referred.  Jaloni Davoli to please inform pt, and ask if needs me to refer him to Vascular Surg[facility] (or does he already have an appt planned)

## 2017-03-19 NOTE — Telephone Encounter (Signed)
[place] for vascular surgury referral - I have done

## 2017-03-19 NOTE — Telephone Encounter (Signed)
Pt has been informed and expressed understanding.   No follow up appt and he would like to be referred to the doctor he went to but can not recall the name.

## 2017-03-22 ENCOUNTER — Ambulatory Visit (HOSPITAL_COMMUNITY): Payer: Medicare Other

## 2017-03-24 ENCOUNTER — Ambulatory Visit (HOSPITAL_COMMUNITY): Payer: Medicare Other

## 2017-03-26 ENCOUNTER — Ambulatory Visit (HOSPITAL_COMMUNITY): Payer: Medicare Other

## 2017-03-29 ENCOUNTER — Other Ambulatory Visit (HOSPITAL_COMMUNITY): Payer: Medicare Other

## 2017-03-29 ENCOUNTER — Ambulatory Visit (HOSPITAL_COMMUNITY): Payer: Medicare Other

## 2017-03-31 ENCOUNTER — Ambulatory Visit (HOSPITAL_COMMUNITY): Payer: Medicare Other

## 2017-04-02 ENCOUNTER — Ambulatory Visit (HOSPITAL_COMMUNITY): Payer: Medicare Other

## 2017-04-05 ENCOUNTER — Ambulatory Visit (HOSPITAL_COMMUNITY): Payer: Medicare Other

## 2017-04-07 ENCOUNTER — Ambulatory Visit (HOSPITAL_COMMUNITY): Payer: Medicare Other

## 2017-04-12 ENCOUNTER — Ambulatory Visit (HOSPITAL_COMMUNITY): Payer: Medicare Other

## 2017-04-14 ENCOUNTER — Ambulatory Visit (HOSPITAL_COMMUNITY): Payer: Medicare Other

## 2017-04-14 DIAGNOSIS — E785 Hyperlipidemia, unspecified: Secondary | ICD-10-CM | POA: Diagnosis not present

## 2017-04-14 DIAGNOSIS — I10 Essential (primary) hypertension: Secondary | ICD-10-CM | POA: Diagnosis not present

## 2017-04-15 DIAGNOSIS — L570 Actinic keratosis: Secondary | ICD-10-CM | POA: Diagnosis not present

## 2017-04-15 DIAGNOSIS — T148XXA Other injury of unspecified body region, initial encounter: Secondary | ICD-10-CM | POA: Diagnosis not present

## 2017-04-15 DIAGNOSIS — L814 Other melanin hyperpigmentation: Secondary | ICD-10-CM | POA: Diagnosis not present

## 2017-04-15 DIAGNOSIS — Z23 Encounter for immunization: Secondary | ICD-10-CM | POA: Diagnosis not present

## 2017-04-15 DIAGNOSIS — D225 Melanocytic nevi of trunk: Secondary | ICD-10-CM | POA: Diagnosis not present

## 2017-04-15 DIAGNOSIS — D1801 Hemangioma of skin and subcutaneous tissue: Secondary | ICD-10-CM | POA: Diagnosis not present

## 2017-04-15 DIAGNOSIS — L820 Inflamed seborrheic keratosis: Secondary | ICD-10-CM | POA: Diagnosis not present

## 2017-04-15 DIAGNOSIS — L821 Other seborrheic keratosis: Secondary | ICD-10-CM | POA: Diagnosis not present

## 2017-04-15 LAB — LIPID PANEL
CHOLESTEROL TOTAL: 145 mg/dL (ref 100–199)
Chol/HDL Ratio: 3.5 ratio (ref 0.0–5.0)
HDL: 42 mg/dL (ref >39)
LDL CALC: 59 mg/dL (ref 0–99)
Triglycerides: 220 mg/dL — ABNORMAL HIGH (ref 0–149)
VLDL CHOLESTEROL CAL: 44 mg/dL — AB (ref 5–40)

## 2017-04-15 LAB — HEPATIC FUNCTION PANEL
ALK PHOS: 129 IU/L — AB (ref 39–117)
ALT: 26 IU/L (ref 0–44)
AST: 24 IU/L (ref 0–40)
Albumin: 4 g/dL (ref 3.5–4.7)
Bilirubin Total: 0.7 mg/dL (ref 0.0–1.2)
Bilirubin, Direct: 0.2 mg/dL (ref 0.00–0.40)
TOTAL PROTEIN: 7 g/dL (ref 6.0–8.5)

## 2017-04-16 ENCOUNTER — Encounter: Payer: Self-pay | Admitting: Internal Medicine

## 2017-04-16 ENCOUNTER — Ambulatory Visit (INDEPENDENT_AMBULATORY_CARE_PROVIDER_SITE_OTHER): Payer: Medicare Other | Admitting: Internal Medicine

## 2017-04-16 VITALS — BP 122/62 | HR 54 | Ht 69.0 in | Wt 182.0 lb

## 2017-04-16 DIAGNOSIS — I25810 Atherosclerosis of coronary artery bypass graft(s) without angina pectoris: Secondary | ICD-10-CM | POA: Diagnosis not present

## 2017-04-16 DIAGNOSIS — I10 Essential (primary) hypertension: Secondary | ICD-10-CM

## 2017-04-16 DIAGNOSIS — I200 Unstable angina: Secondary | ICD-10-CM

## 2017-04-16 DIAGNOSIS — E785 Hyperlipidemia, unspecified: Secondary | ICD-10-CM

## 2017-04-16 DIAGNOSIS — N183 Chronic kidney disease, stage 3 unspecified: Secondary | ICD-10-CM

## 2017-04-16 DIAGNOSIS — I480 Paroxysmal atrial fibrillation: Secondary | ICD-10-CM

## 2017-04-16 MED ORDER — ICOSAPENT ETHYL 1 G PO CAPS
2.0000 g | ORAL_CAPSULE | Freq: Two times a day (BID) | ORAL | 5 refills | Status: DC
Start: 2017-04-16 — End: 2017-06-22
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
## 2017-04-16 NOTE — Patient Instructions (Addendum)
Your physician has recommended you make the following change in your medication:  -- START vascepa 2 gram twice daily - 5 boxes samples provided  3 boxes Brilinta samples provided  Your physician recommends that you return for lab work in [place] fasting to check cholesterol   Your physician wants you to follow-up in: August 2019. You will receive a reminder letter in the mail two months in advance. If you don't receive a letter, please call our office to schedule the follow-up appointment.

## 2017-04-19 ENCOUNTER — Ambulatory Visit (HOSPITAL_COMMUNITY): Payer: Medicare Other

## 2017-04-21 ENCOUNTER — Ambulatory Visit (HOSPITAL_COMMUNITY): Payer: Medicare Other

## 2017-04-21 DIAGNOSIS — Z6827 Body mass index (BMI) 27.0-27.9, adult: Secondary | ICD-10-CM | POA: Diagnosis not present

## 2017-04-21 DIAGNOSIS — N2581 Secondary hyperparathyroidism of renal origin: Secondary | ICD-10-CM | POA: Diagnosis not present

## 2017-04-21 DIAGNOSIS — I739 Peripheral vascular disease, unspecified: Secondary | ICD-10-CM | POA: Diagnosis not present

## 2017-04-21 DIAGNOSIS — N183 Chronic kidney disease, stage 3 (moderate): Secondary | ICD-10-CM | POA: Diagnosis not present

## 2017-04-21 DIAGNOSIS — J449 Chronic obstructive pulmonary disease, unspecified: Secondary | ICD-10-CM | POA: Diagnosis not present

## 2017-04-21 DIAGNOSIS — I2510 Atherosclerotic heart disease of native coronary artery without angina pectoris: Secondary | ICD-10-CM | POA: Diagnosis not present

## 2017-04-21 DIAGNOSIS — D509 Iron deficiency anemia, unspecified: Secondary | ICD-10-CM | POA: Diagnosis not present

## 2017-04-21 DIAGNOSIS — I700 Atherosclerosis of aorta: Secondary | ICD-10-CM | POA: Diagnosis not present

## 2017-04-23 ENCOUNTER — Ambulatory Visit (HOSPITAL_COMMUNITY): Payer: Medicare Other

## 2017-04-26 ENCOUNTER — Ambulatory Visit (HOSPITAL_COMMUNITY): Payer: Medicare Other

## 2017-04-28 ENCOUNTER — Ambulatory Visit (INDEPENDENT_AMBULATORY_CARE_PROVIDER_SITE_OTHER): Payer: Medicare Other | Admitting: Vascular Surgery

## 2017-04-28 ENCOUNTER — Encounter: Payer: Self-pay | Admitting: Vascular Surgery

## 2017-04-28 ENCOUNTER — Other Ambulatory Visit: Payer: Self-pay

## 2017-04-28 ENCOUNTER — Ambulatory Visit (HOSPITAL_COMMUNITY): Payer: Medicare Other

## 2017-04-28 VITALS — BP 125/73 | HR 59 | Temp 97.2°F | Resp 18 | Ht 69.0 in | Wt 182.0 lb

## 2017-04-28 DIAGNOSIS — I714 Abdominal aortic aneurysm, without rupture, unspecified: Secondary | ICD-10-CM

## 2017-04-28 DIAGNOSIS — I200 Unstable angina: Secondary | ICD-10-CM

## 2017-04-28 NOTE — Progress Notes (Signed)
[facility] [place]  Patient name: Omar Benson MRN: [id number] DOB: 1935-02-28 Sex: male  REASON FOR CONSULT: Discussion of enlargement of known abdominal aortic aneurysm  HPI: Omar Benson is a 82 y.o. male, who is here today for follow-up.  I had seen him in 2013 at which time he had ectasia or small aneurysm of his infrarenal aorta at 2.8 cm.  He has had serial studies of this.  He recently in 09-Mar-2023 had repeat of this and was found to have dilatation up to 3.4 cm and is seen today for further discussion of this.  He also has known moderate stenosis of his right common iliac artery but has no symptoms referable to this.  He remains quite active and appears younger than his stated age of 68 does have history of prior coronary artery bypass grafting.  No history of stroke.  No symptoms referable to his aorta.  Past Medical History:  Diagnosis Date  . AAA 09/19/2008  . ANEMIA-IRON DEFICIENCY 09/19/2008  . Anxiety    "related to my health; when BP rises, etc" (12/29/2016)  . Arthritis    no meds - knee/back (12/29/2016)  . Atrial fibrillation ([place]) 04/19/2008   amiodarone rx;  Echocardiogram 05/26/11: No wall motion abnormalities, mild LVH, EF 60%.  . Atrial flutter ([place]) 12/05/2008   s/p RFCA - ablation  . Benign esophageal stricture ~ 2007   dilated during EGD  . BRADYCARDIA 12/05/2008  . CAD 12/05/2008   s/p CABG; NSTEMI 05/2011 - LHC 05/25/11: LAD occluded, LIMA-LAD patent, ostial circumflex 50%, AV circumflex 70%, SVG-OM2 with extensive disease with thrombus (culprit vessel), RCA 80% and occluded, SVG-intermediate patent, SVG-PDA/PL A patent.  Given that the culprit vessel was a subtotally occluded heavily thrombotic graft to a smaller OM2, medical therapy was recommended.;   . Cataract    left - small - no treatment yet  . Chronic upper back pain    "between the shoulders" (12/29/2016)  . CKD (chronic kidney disease) 09/19/2008   Qualifier: Diagnosis of  By: Jenny Reichmann MD, Hunt Oris  -- Stage 3  . COLONIC POLYPS, HX OF 09/19/2008   adenomatous polyps 07/2010  . COPD (chronic obstructive pulmonary disease) ([place])   . CORONARY ARTERY BYPASS GRAFT, HX OF    A. LIMA-LAD, VG-RI, VG-OM2, VG-RPD/RPL;  B. 05/2011 - NSTEMI - CATH WITH 4/5 PATENT GRAFTS AND NEW THROMBUS IN DISTAL VG-OM2 - MED RX  . DIVERTICULOSIS, COLON 09/19/2008  . Emphysema of lung ([place])    "they say I have a little" (12/29/2016)  . Environmental allergies    "allergic to a couple kinds of trees; nothing major" (12/29/2016)  . Erectile dysfunction 09/13/2012  . GASTROINTESTINAL HEMORRHAGE, HX OF 04/19/2008  . GERD 09/19/2008  . GOUT 09/19/2008  . History of blood transfusion 04/19/2008   2 units transfused; "bleeding in my intestines; related to [employer]  . History of kidney stones   . HYPERLIPIDEMIA 09/19/2008   no meds - diet controlled  . HYPERTENSION 09/19/2008   patient denies this dx - no meds for HTN  . Hypothyroidism 12/05/2008  . Myocardial infarction ([place]) 2013, 2017   both minor - left anterior vessel  . PERIPHERAL VASCULAR DISEASE 09/19/2008  . PSA, INCREASED 09/19/2008  . RENAL INSUFFICIENCY 09/19/2008  . Unstable angina pectoris ([place]) 05/2015   Cath 02/02 w/ patent LIMA-LAD, SVG-D1, SVG-RCA, severe dz in SVG-OM, med rx    Family History  Problem Relation Age of Onset  .  Multiple myeloma Mother   . Cancer Mother   . Other Mother        varicose veins  . Diabetes Father   . Diabetes Sister   . Heart disease Paternal Uncle   . Heart disease Maternal Uncle   . Lymphoma Sister        half-sister  . Heart attack Maternal Uncle   . Heart attack Paternal Grandmother   . Heart attack Paternal Grandfather   . Hypertension Neg Hx   . Stroke Neg Hx   . Colon cancer Neg Hx   . Colon polyps Neg Hx   . Rectal cancer Neg Hx   . Stomach cancer Neg Hx     SOCIAL HISTORY: Social History   Socioeconomic History  . Marital status: Married    Spouse name:  [name]  . Number of children: Not on file  . Years of education: Not on file  . Highest education level: Not on file  Social Needs  . Financial resource strain: Not on file  . Food insecurity - worry: Not on file  . Food insecurity - inability: Not on file  . Transportation needs - medical: Not on file  . Transportation needs - non-medical: Not on file  Occupational History  . Occupation: self Therapist, art: SELF EMPLOYED  Tobacco Use  . Smoking status: Former Smoker    Packs/day: 3.50    Years: 31.00    Pack years: 108.50    Types: Cigarettes    Last attempt to quit: 03/19/1983    Years since quitting: 34.1  . Smokeless tobacco: Never Used  Substance and Sexual Activity  . Alcohol use: Yes    Comment: 12/29/2016 "usually 10-12oz liquor/wk; only had 1 shot in the last 10 days"  . Drug use: No  . Sexual activity: Not Currently  Other Topics Concern  . Not on file  Social History Narrative   Married lives with his wife    Allergies  Allergen Reactions  . Fish Allergy Diarrhea and Nausea And Vomiting  . Promethazine Hcl Other (See Comments)    Syncope (reaction to phenergan)  . Shellfish Allergy Diarrhea and Nausea And Vomiting  . Yellow Jacket Venom [Bee Venom] Swelling    Localized swelling from yellow jackets and honey bees  . Doxycycline Hyclate Other (See Comments)    esophagitis  . Lipitor [Atorvastatin Calcium] Other (See Comments)    Myalgia/myopathy  . Tetracycline Other (See Comments)    Esophagitis  . Crestor [Rosuvastatin] Other (See Comments)    Myalgia/myopathy  . Tetanus Toxoid Swelling and Rash    Current Outpatient Medications  Medication Sig Dispense Refill  . allopurinol (ZYLOPRIM) 300 MG tablet Take 1 tablet (300 mg total) by mouth daily. 90 tablet 3  . amiodarone (PACERONE) 200 MG tablet Take 0.5 tablets (100 mg total) by mouth daily. (Patient taking differently: Take 100 mg by mouth at bedtime. ) 45 tablet 3  . aspirin 81  MG tablet Take 1 tablet (81 mg total) by mouth at bedtime. 30 tablet 11  . b complex vitamins tablet Take 1 tablet by mouth daily.     . Cholecalciferol (VITAMIN D) 2000 UNITS CAPS Take 2,000 Units by mouth daily.     Marland Kitchen EPINEPHrine (EPIPEN) 0.3 mg/0.3 mL SOAJ injection Inject 0.3 mLs (0.3 mg total) into the muscle once. (Patient taking differently: Inject 0.3 mg into the muscle once as needed (severe allergic reaction). ) 2 Device 2  . ezetimibe (  ZETIA) 10 MG tablet Take 1 tablet (10 mg total) by mouth daily. 30 tablet 11  . hydrALAZINE (APRESOLINE) 25 MG tablet Take 1.5 tablets (37.5 mg total) by mouth 2 (two) times daily. 90 tablet 11  . Icosapent Ethyl (VASCEPA) 1 g CAPS Take 2 g by mouth 2 (two) times daily. 120 capsule 5  . levothyroxine (SYNTHROID, LEVOTHROID) 125 MCG tablet Take 1 tablet (125 mcg total) by mouth daily. 90 tablet 3  . Magnesium 250 MG TABS Take 250 mg by mouth daily.     . Multiple Vitamin (MULTIVITAMIN WITH MINERALS) TABS tablet Take 1 tablet by mouth daily.     . nitroGLYCERIN (NITROSTAT) 0.4 MG SL tablet Place 1 tablet (0.4 mg total) under the tongue every 5 (five) minutes as needed for chest pain. 25 tablet 2  . pantoprazole (PROTONIX) 40 MG tablet Take 1 tablet (40 mg total) by mouth daily. Yearly physical due in February must see MD for refills (Patient taking differently: Take 40 mg by mouth at bedtime. Yearly physical due in February must see MD for refills) 90 tablet 3  . ticagrelor (BRILINTA) 90 MG TABS tablet Take 1 tablet (90 mg total) by mouth 2 (two) times daily. 60 tablet 0  . tiotropium (SPIRIVA HANDIHALER) 18 MCG inhalation capsule PLACE 1 CAPSULE (18 MCG TOTAL) INTO INHALER AND INHALE DAILY. (Patient taking differently: Place 18 mcg into inhaler and inhale daily. ) 90 capsule 3  . vitamin C (ASCORBIC ACID) 500 MG tablet Take 500 mg by mouth daily.     . isosorbide mononitrate (IMDUR) 60 MG 24 hr tablet Take 1 tablet (60 mg total) by mouth daily. 90 tablet 3  .  neomycin-bacitracin-polymyxin (NEOSPORIN) OINT Apply 1 application topically daily as needed for wound care.     No current facility-administered medications for this visit.     REVIEW OF SYSTEMS:  _0  denotes positive finding, _1  denotes negative finding Cardiac  Comments:  Chest pain or chest pressure:    Shortness of breath upon exertion: x   Short of breath when lying flat:    Irregular heart rhythm: x       Vascular    Pain in calf, thigh, or hip brought on by ambulation:    Pain in feet at night that wakes you up from your sleep:     Blood clot in your veins:    Leg swelling:         Pulmonary    Oxygen at home:    Productive cough:     Wheezing:         Neurologic    Sudden weakness in arms or legs:     Sudden numbness in arms or legs:     Sudden onset of difficulty speaking or slurred speech:    Temporary loss of vision in one eye:     Problems with dizziness:         Gastrointestinal    Blood in stool:     Vomited blood:         Genitourinary    Burning when urinating:     Blood in urine:        Psychiatric    Major depression:         Hematologic    Bleeding problems:    Problems with blood clotting too easily:        Skin    Rashes or ulcers:        Constitutional    Fever  or chills:      PHYSICAL EXAM: Vitals:   04/28/17 1047  BP: 125/73  Pulse: (!) 59  Resp: 18  Temp: (!) 97.2 F (36.2 C)  Weight: 182 lb (82.6 kg)  Height: _0  (1.753 m)    GENERAL: The patient is a well-nourished male, in no acute distress. The vital signs are documented above. CARDIOVASCULAR: Carotid arteries without bruits bilaterally.  Radial, femoral, popliteal and dorsalis pedis pulses all 2+ with no evidence of peripheral artery aneurysms PULMONARY: There is good air exchange  ABDOMEN: Soft and non-tender.  No aneurysm palpable mUSCULOSKELETAL: There are no major deformities or cyanosis. NEUROLOGIC: No focal weakness or paresthesias are detected. SKIN: There  are no ulcers or rashes noted. PSYCHIATRIC: The patient has a normal affect.  DATA:  I reviewed his recent report from October 2018.  Also the patient underwent a CT angiogram of his chest abdomen and pelvis to rule out dissection in February 2017.  I reviewed these films and discussed this with patient and his family as well.  CT scan showed diffuse ectasia with no true aneurysm.  Showed the mural thrombus in his right common iliac artery with mild narrowing.  MEDICAL ISSUES: Small infrarenal abdominal aortic aneurysm now at 3.4 cm.  I explained that there would be minimal risk for rupture and small risk for lifelong expansion to the point of requiring treatment.  I have recommended that we see him again in 2 years with ultrasound at that time to rule out any significant enlargement.   Rosetta Posner, MD FACS [facility] Tel [phone] Pager [phone]

## 2017-04-30 ENCOUNTER — Ambulatory Visit (HOSPITAL_COMMUNITY): Payer: Medicare Other

## 2017-05-03 ENCOUNTER — Ambulatory Visit (HOSPITAL_COMMUNITY): Payer: Medicare Other

## 2017-05-05 ENCOUNTER — Ambulatory Visit (HOSPITAL_COMMUNITY): Payer: Medicare Other

## 2017-05-07 ENCOUNTER — Encounter: Payer: Self-pay | Admitting: Internal Medicine

## 2017-05-07 ENCOUNTER — Ambulatory Visit (INDEPENDENT_AMBULATORY_CARE_PROVIDER_SITE_OTHER): Payer: Medicare Other | Admitting: Internal Medicine

## 2017-05-07 VITALS — BP 126/78 | HR 57 | Temp 97.7°F | Ht 69.0 in | Wt 184.0 lb

## 2017-05-07 DIAGNOSIS — I200 Unstable angina: Secondary | ICD-10-CM

## 2017-05-07 DIAGNOSIS — E038 Other specified hypothyroidism: Secondary | ICD-10-CM

## 2017-05-07 DIAGNOSIS — N183 Chronic kidney disease, stage 3 unspecified: Secondary | ICD-10-CM

## 2017-05-07 DIAGNOSIS — I10 Essential (primary) hypertension: Secondary | ICD-10-CM | POA: Diagnosis not present

## 2017-05-07 DIAGNOSIS — E785 Hyperlipidemia, unspecified: Secondary | ICD-10-CM | POA: Diagnosis not present

## 2017-05-07 MED ORDER — ISOSORBIDE MONONITRATE ER 60 MG PO TB24: 60.0000 mg | ORAL_TABLET | Freq: Every day | ORAL | 3 refills | Status: DC

## 2017-05-07 MED ORDER — ALLOPURINOL 300 MG PO TABS: 300.0000 mg | ORAL_TABLET | Freq: Every day | ORAL | 3 refills | Status: DC

## 2017-05-07 MED ORDER — TIOTROPIUM BROMIDE MONOHYDRATE 18 MCG IN CAPS: 18.0000 ug | ORAL_CAPSULE | Freq: Every day | RESPIRATORY_TRACT | 3 refills | Status: DC

## 2017-05-07 MED ORDER — HYDRALAZINE HCL 25 MG PO TABS: 37.5000 mg | ORAL_TABLET | Freq: Two times a day (BID) | ORAL | 3 refills | Status: DC

## 2017-05-07 MED ORDER — EZETIMIBE 10 MG PO TABS: 10.0000 mg | ORAL_TABLET | Freq: Every day | ORAL | 3 refills | Status: DC

## 2017-05-07 MED ORDER — PANTOPRAZOLE SODIUM 40 MG PO TBEC: 40.0000 mg | DELAYED_RELEASE_TABLET | Freq: Every day | ORAL | 3 refills | Status: DC

## 2017-05-07 MED ORDER — LEVOTHYROXINE SODIUM 125 MCG PO TABS: 125.0000 ug | ORAL_TABLET | Freq: Every day | ORAL | 3 refills | Status: DC

## 2017-05-07 NOTE — Patient Instructions (Addendum)
Please continue all other medications as before, and refills have been done if requested.  Please have the pharmacy call with any other refills you may need.  Please continue your efforts at being more active, low cholesterol diet, and weight control.  Please keep your appointments with your specialists as you may have planned  Please return in 6 months, or sooner if needed 
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
## 2017-05-08 NOTE — Assessment & Plan Note (Addendum)
stable overall by history and exam, recent data reviewed with pt, and pt to continue medical treatment as before,  to f/u any worsening symptoms or concerns Lab Results  Component Value Date   CREATININE 1.75 (H) 02/26/2017  decliens f/u lab today

## 2017-05-08 NOTE — Assessment & Plan Note (Signed)
Lab Results  Component Value Date   LDLCALC 59 04/14/2017  stable overall by history and exam, recent data reviewed with pt, and pt to continue medical treatment as before,  to f/u any worsening symptoms or concerns

## 2017-05-08 NOTE — Assessment & Plan Note (Signed)
stable overall by history and exam, recent data reviewed with pt, and pt to continue medical treatment as before,  to f/u any worsening symptoms or concerns BP Readings from Last 3 Encounters:  05/07/17 126/78  04/28/17 125/73  04/16/17 122/62

## 2017-05-08 NOTE — Assessment & Plan Note (Signed)
Asympt,  Lab Results  Component Value Date   TSH 1.19 06/18/2016  stable overall by history and exam, recent data reviewed with pt, and pt to continue medical treatment as before,  to f/u any worsening symptoms or concerns

## 2017-05-10 ENCOUNTER — Ambulatory Visit (HOSPITAL_COMMUNITY): Payer: Medicare Other

## 2017-05-10 NOTE — Addendum Note (Signed)
Addended by: Lianne Cure A on: 05/10/2017 11:51 AM   Modules accepted: Orders

## 2017-05-11 IMAGING — CT CT ANGIO CHEST
2 of 7 series · 15 of 46 positions shown · IV contrast (OMNI 350)
Comparison: Chest radiograph 06/19/2015, CT of the chest 09/22/2010

CLINICAL DATA: Sharp chest and back pain.  Nausea and weakness.

EXAM:
CT ANGIOGRAPHY CHEST
CTA ABDOMEN AND PELVIS WITHOUT AND WITH CONTRAST
TECHNIQUE: Multidetector CT imaging of the chest, abdomen and pelvis was
performed using the standard protocol during bolus administration of
intravenous contrast. Multiplanar CT image reconstructions and MIPs
were obtained to evaluate the vascular anatomy.
CONTRAST:  80mL OMNIPAQUE IOHEXOL 350 MG/ML SOLN

[Series 6: dissection 2mm · axial · 0.78mm/px · z∈[-691,-131]mm · 12 of 316 slices shown]
[im 18/316  lung]
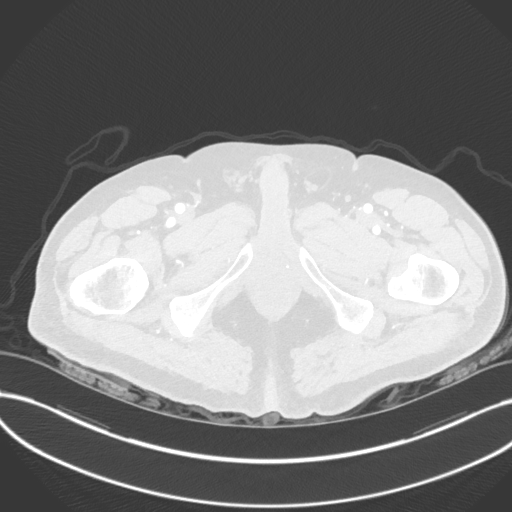
[im 53/316  soft-tissue]
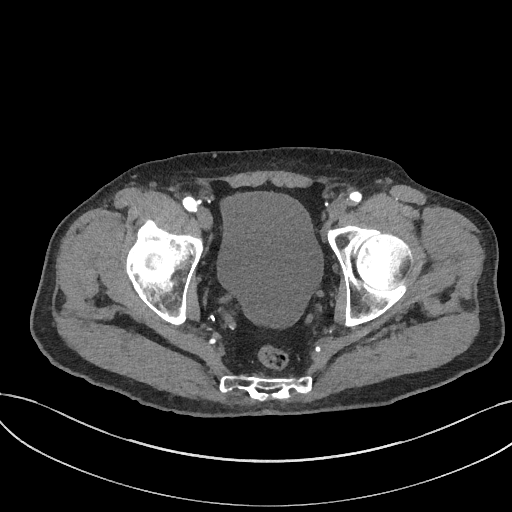
[im 71/316  lung]
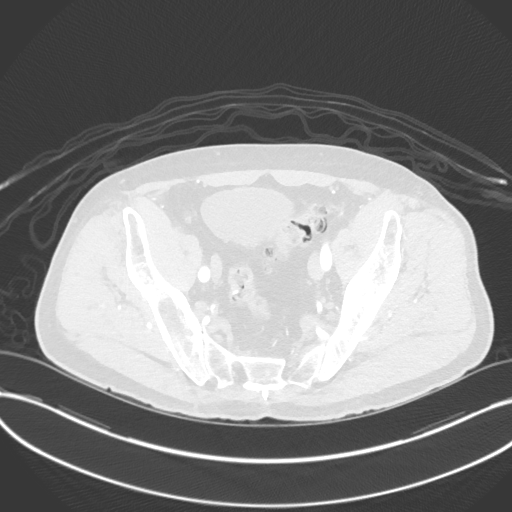
[im 88/316  soft-tissue]
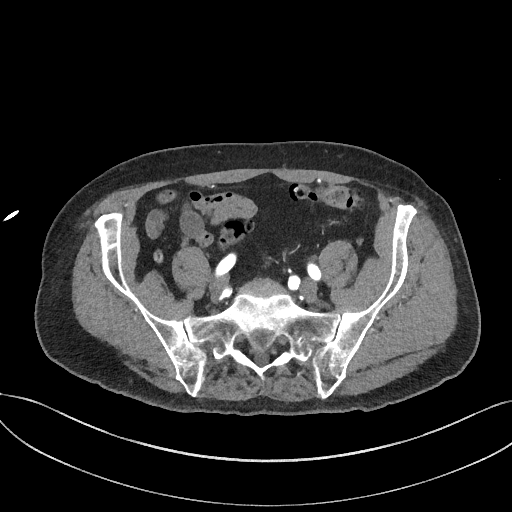
[im 123/316  lung]
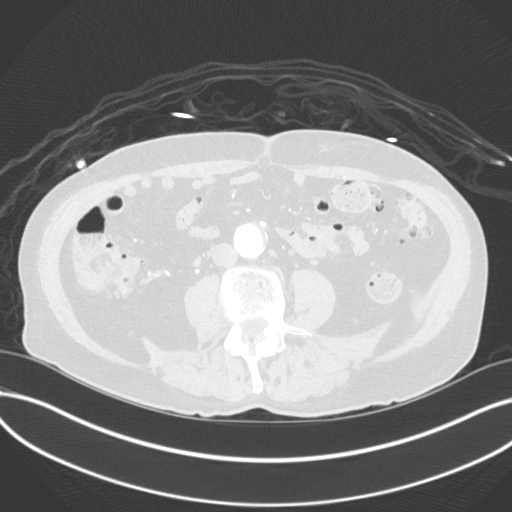
[im 141/316  soft-tissue]
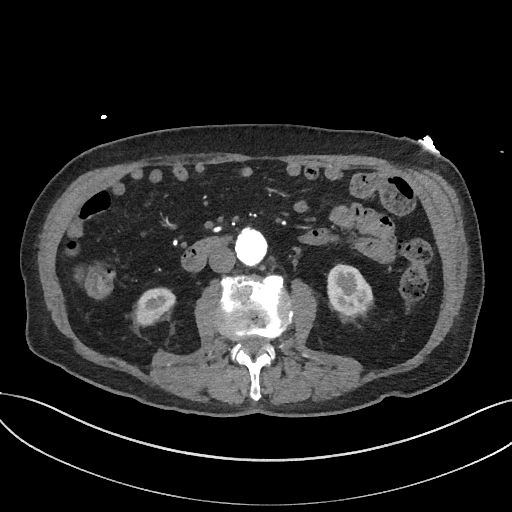
[im 176/316  lung]
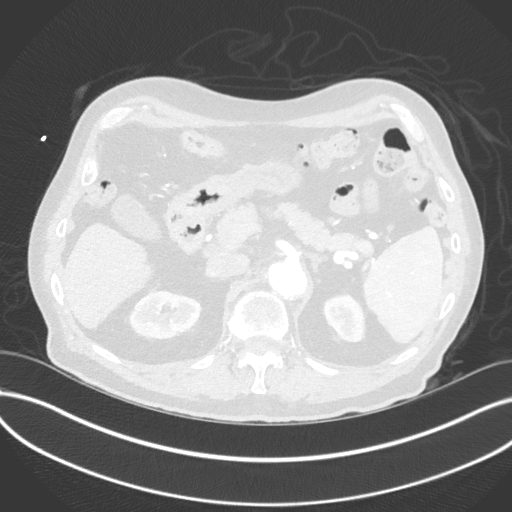
[im 193/316  soft-tissue]
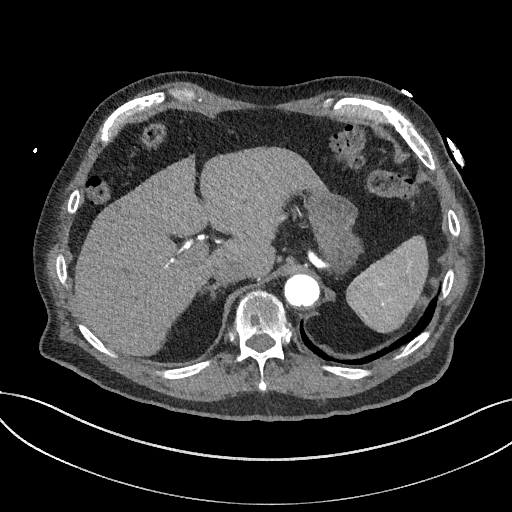
[im 228/316  lung]
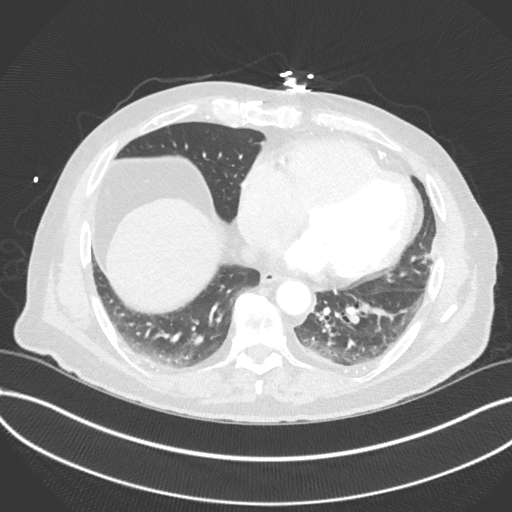
[im 246/316  soft-tissue]
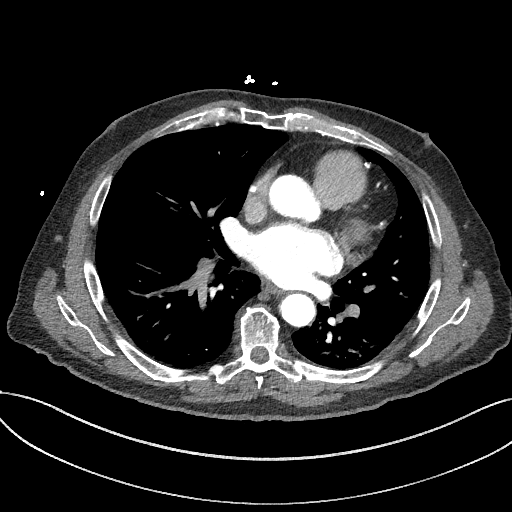
[im 263/316  lung]
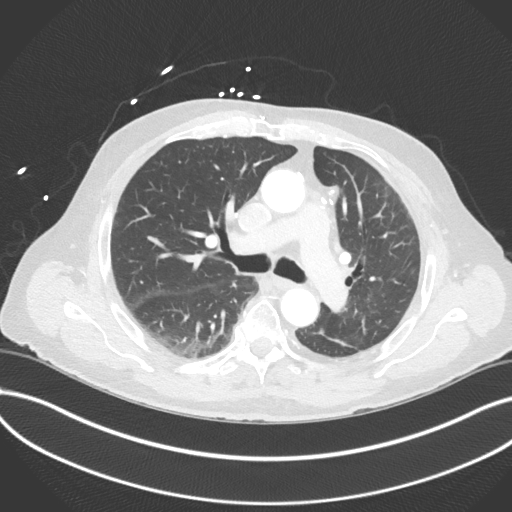
[im 298/316  soft-tissue]
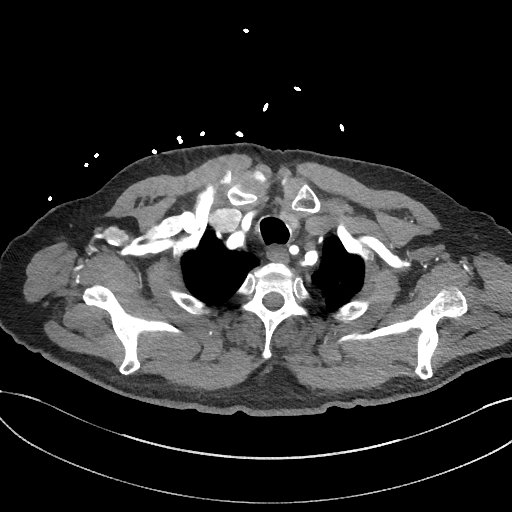

[Series 9: dissection 2mm cor · coronal · 0.81mm/px · 3 of 123 slices shown]
[im 31/123  soft-tissue]
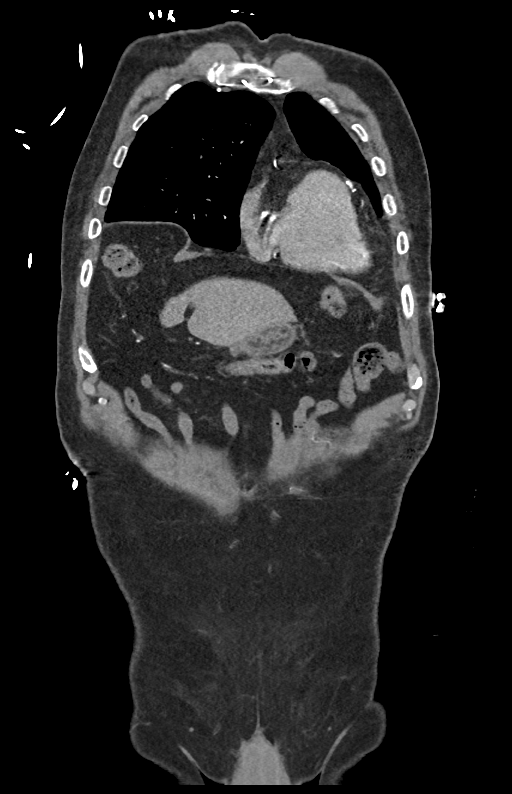
[im 62/123  soft-tissue]
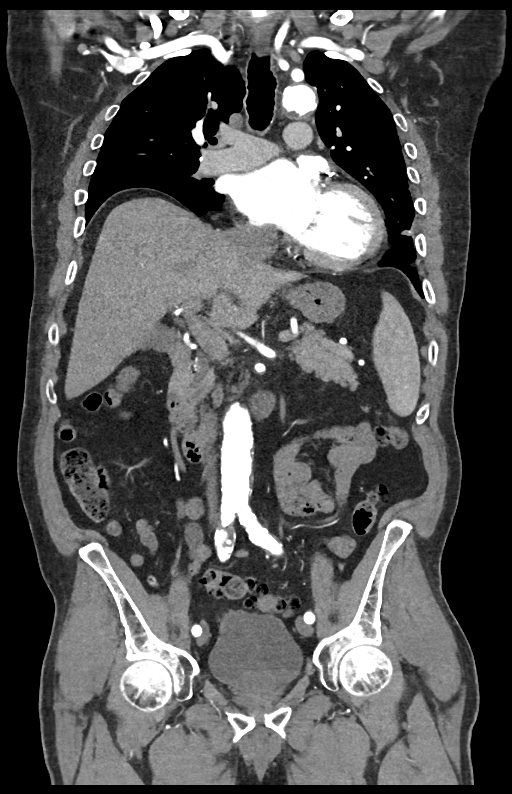
[im 92/123  soft-tissue]
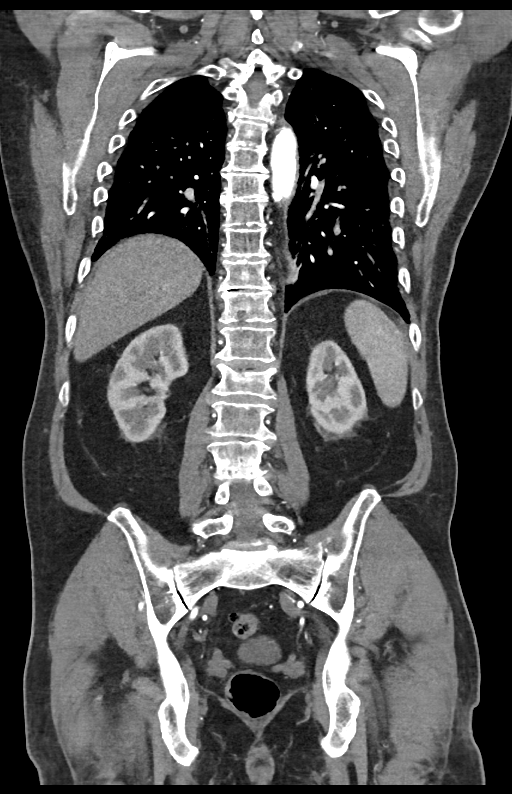

[15 of 46 positions shown; findings below may reference images not displayed]

FINDINGS: CTA CHEST FINDINGS

Status post CABG. Right main coronary artery and left circumflex
grafts are patent. Calcified atherosclerotic disease of the native
coronary arteries noted. Moderate in severity calcified and
noncalcified plaque of the aorta and tortuosity is seen, without
evidence of dissection. There is no evidence of pulmonary embolus.
No axillary lymphadenopathy is seen. The visualized portions of the
thyroid gland are unremarkable in appearance. The heart is enlarged
with preferential right heart enlargement. No pericardial effusion.

Mild left apical subpleural scarring. Lingular linear subpleural
thickening likely also corresponds to scarring. There is no evidence
of significant focal lung parenchymal consolidation, pleural
effusion or pneumothorax. No masses are identified ; no abnormal
focal contrast enhancement is seen.

No acute osseous abnormalities are seen.

CT ABDOMEN and PELVIS FINDINGS

The liver, spleen, adrenals, pancreas, kidneys are unremarkable.

There is no evidence of bowel obstruction, enteritis, colitis,
diverticulitis, nor appendicitis. The appendix is identified and is
unremarkable. A moderate amount of fecal retention is appreciated
within the colon.

There is no evidence of abdominal aortic aneurysm. There is
calcified and nonaggressive of plaque along the abdominal aorta and
iliac arteries. Evidence of soft plaque ulceration is seen within
the mid right common iliac artery ( image 218/ series 6 and image
80/sequence 10). Mild fusiform dilation of the right common iliac
artery measuring 18 mm in maximum diameter is seen at the same
level. The celiac, SMA, IMA, portal vein, SMV are opacified. Coarse
calcifications are seen at the takeoff of celiac trunk, SMA,
bilateral renal arteries, without flow limiting stenosis.

There is no evidence of abdominal or pelvic free fluid, loculated
fluid collections, masses, nor adenopathy scant mural calcifications
identified within the aorta and mesenteric vessels.

There is no evidence of abdominal wall nor inguinal hernia.

There is mild colonic diverticulosis without evidence of
diverticulitis the appendix is normal.

The urinary bladder is distended. Prostate gland appears mildly
prominent and abutting the posterior bladder wall.

There is no evidence of aggressive appearing osseous lesions. There
is mild S shape spine scoliosis. Multilevel osteoarthritic changes
are seen, worse at T12-L1, with there is a superior endplate
compression deformity with approximately 40% height loss, likely
degenerative. L1-L2, L2-L3 also demonstrate advanced osteoarthritic
changes. Significant posterior facet arthropathy of the lower
lumbosacral spine.

Review of the MIP images confirms the above findings.
IMPRESSION: No evidence of aortic dissection or pulmonary embolus.

Mild cardiomegaly. Post CABG with patent graft to the main right
coronary artery and left circumflex artery.

Tortuosity and atherosclerotic disease of the aorta with calcified
and noncalcified plaque.

Bulky calcifications at the takeoff of the major aortic abdominal
branches without evidence of flow limiting stenosis.

An area of mild fusiform dilation of the mid right iliac artery and
focal soft plaque ulceration.

No acute abnormalities within the solid abdominal organs.

Diffuse colonic diverticulosis without evidence of diverticulitis.

Mild prominence of the prostate gland, impinging on the posterior
wall of the urinary bladder. Correlation to PCAs recommended.

Multilevel osteoarthritic changes of the spine.

Likely degenerative superior endplate L1 compression fracture with
approximately 40% height loss.

## 2017-05-12 ENCOUNTER — Ambulatory Visit (HOSPITAL_COMMUNITY): Payer: Medicare Other

## 2017-05-14 ENCOUNTER — Ambulatory Visit (HOSPITAL_COMMUNITY): Payer: Medicare Other

## 2017-05-17 ENCOUNTER — Ambulatory Visit (HOSPITAL_COMMUNITY): Payer: Medicare Other

## 2017-05-19 ENCOUNTER — Ambulatory Visit (HOSPITAL_COMMUNITY): Payer: Medicare Other

## 2017-05-21 ENCOUNTER — Ambulatory Visit (HOSPITAL_COMMUNITY): Payer: Medicare Other

## 2017-05-24 ENCOUNTER — Ambulatory Visit (HOSPITAL_COMMUNITY): Payer: Medicare Other

## 2017-05-26 ENCOUNTER — Ambulatory Visit (HOSPITAL_COMMUNITY): Payer: Medicare Other

## 2017-05-28 ENCOUNTER — Ambulatory Visit (HOSPITAL_COMMUNITY): Payer: Medicare Other

## 2017-05-31 ENCOUNTER — Ambulatory Visit (HOSPITAL_COMMUNITY): Payer: Medicare Other

## 2017-06-02 ENCOUNTER — Ambulatory Visit (HOSPITAL_COMMUNITY): Payer: Medicare Other

## 2017-06-04 ENCOUNTER — Ambulatory Visit (HOSPITAL_COMMUNITY): Payer: Medicare Other

## 2017-06-07 ENCOUNTER — Ambulatory Visit (HOSPITAL_COMMUNITY): Payer: Medicare Other

## 2017-06-09 ENCOUNTER — Ambulatory Visit (HOSPITAL_COMMUNITY): Payer: Medicare Other

## 2017-06-17 ENCOUNTER — Ambulatory Visit: Payer: Medicare Other | Admitting: Internal Medicine

## 2017-06-22 ENCOUNTER — Ambulatory Visit (INDEPENDENT_AMBULATORY_CARE_PROVIDER_SITE_OTHER): Payer: Medicare Other | Admitting: *Deleted

## 2017-06-22 VITALS — BP 122/68 | HR 58 | Resp 18 | Ht 69.0 in | Wt 184.0 lb

## 2017-06-22 DIAGNOSIS — Z0000 Encounter for general adult medical examination without abnormal findings: Secondary | ICD-10-CM
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
## 2017-06-22 NOTE — Patient Instructions (Addendum)
Continue doing brain stimulating activities (puzzles, reading, adult coloring books, staying active) to keep memory sharp.   Continue to eat heart healthy diet (full of fruits, vegetables, whole grains, lean protein, water--limit salt, fat, and sugar intake) and increase physical activity as tolerated.  Omar Benson , Thank you for taking time to come for your Medicare Wellness Visit. I appreciate your ongoing commitment to your health goals. Please review the following plan we discussed and let me know if I can assist you in the future.   These are the goals we discussed: Goals    . patient     Stay as healthy and as independent as possible. Live a long life, enjoy life and family.       This is a list of the screening recommended for you and due dates:  Health Maintenance  Topic Date Due  . Tetanus Vaccine  09/14/1953  . Pneumonia vaccines (1 of 2 - PCV13) 09/13/2001  . Flu Shot  08/16/2017*  *Topic was postponed. The date shown is not the original due date.

## 2017-06-25 ENCOUNTER — Ambulatory Visit (INDEPENDENT_AMBULATORY_CARE_PROVIDER_SITE_OTHER): Payer: Medicare Other | Admitting: Nurse Practitioner

## 2017-06-25 ENCOUNTER — Encounter: Payer: Self-pay | Admitting: Nurse Practitioner

## 2017-06-25 VITALS — BP 138/72 | HR 59 | Temp 97.5°F | Ht 69.0 in | Wt 185.0 lb

## 2017-06-25 DIAGNOSIS — R102 Pelvic and perineal pain: Secondary | ICD-10-CM | POA: Diagnosis not present

## 2017-06-25 DIAGNOSIS — R350 Frequency of micturition: Secondary | ICD-10-CM | POA: Diagnosis not present

## 2017-06-25 DIAGNOSIS — N401 Enlarged prostate with lower urinary tract symptoms: Secondary | ICD-10-CM

## 2017-06-25 DIAGNOSIS — M1A09X0 Idiopathic chronic gout, multiple sites, without tophus (tophi): Secondary | ICD-10-CM | POA: Diagnosis not present

## 2017-06-25 DIAGNOSIS — R338 Other retention of urine: Secondary | ICD-10-CM

## 2017-06-25 LAB — POCT URINALYSIS DIPSTICK
BILIRUBIN UA: NEGATIVE
Glucose, UA: NEGATIVE
Ketones, UA: NEGATIVE
LEUKOCYTES UA: NEGATIVE
Nitrite, UA: NEGATIVE
PH UA: 5.5 (ref 5.0–8.0)
PROTEIN UA: NEGATIVE
RBC UA: NEGATIVE
Spec Grav, UA: >=1.03 — AB (ref 1.010–1.025)
UROBILINOGEN UA: 0.2 U/dL

## 2017-06-25 MED ORDER — AMOXICILLIN-POT CLAVULANATE 875-125 MG PO TABS: 1.0000 | ORAL_TABLET | Freq: Two times a day (BID) | ORAL | 0 refills | Status: DC

## 2017-06-25 MED ORDER — ALLOPURINOL 300 MG PO TABS: 300.0000 mg | ORAL_TABLET | Freq: Every day | ORAL | 0 refills | Status: DC

## 2017-06-25 NOTE — Patient Instructions (Addendum)
CT ABD/pelvis is to rule out prostatitis vs diverticulitis?  Negative for diverticulitis. Enlarged prostate causing some urinary retention. changed oral abx to cipro Entered referral to urology  Abdominal Pain, Adult Abdominal pain can be caused by many things. Often, abdominal pain is not serious and it gets better with no treatment or by being treated at home. However, sometimes abdominal pain is serious. Your health care provider will do a medical history and a physical exam to try to determine the cause of your abdominal pain. Follow these instructions at home:  Take over-the-counter and prescription medicines only as told by your health care provider. Do not take a laxative unless told by your health care provider.  Drink enough fluid to keep your urine clear or pale yellow.  Watch your condition for any changes.  Keep all follow-up visits as told by your health care provider. This is important. Contact a health care provider if:  Your abdominal pain changes or gets worse.  You are not hungry or you lose weight without trying.  You are constipated or have diarrhea for more than 2-3 days.  You have pain when you urinate or have a bowel movement.  Your abdominal pain wakes you up at night.  Your pain gets worse with meals, after eating, or with certain foods.  You are throwing up and cannot keep anything down.  You have a fever. Get help right away if:  Your pain does not go away as soon as your health care provider told you to expect.  You cannot stop throwing up.  Your pain is only in areas of the abdomen, such as the right side or the left lower portion of the abdomen.  You have bloody or black stools, or stools that look like tar.  You have severe pain, cramping, or bloating in your abdomen.  You have signs of dehydration, such as: ? Dark urine, very little urine, or no urine. ? Cracked lips. ? Dry mouth. ? Sunken eyes. ? Sleepiness. ? Weakness. This  information is not intended to replace advice given to you by your health care provider. Make sure you discuss any questions you have with your health care provider. Document Released: 02/11/2005 Document Revised: 11/22/2015 Document Reviewed: 10/16/2015 Elsevier Interactive Patient Education  [employer].

## 2017-06-25 NOTE — Assessment & Plan Note (Signed)
I explained to Mr. Schnick that despite hx of diverticulosis, his symptoms are less likely due to diverticulitis exacerbation. I informed him that this is more likely due to his prostate and/or bladder. I recommended CT ABD/pelvis and use of different oral antibiotics. He declined cipro and doxycyline prescription, stating he does not want to try anything new. He only want Augmentin since it has worked in past and had no adverse effects.  Provided augmentin x 4days and ordered CT ABD/pelvis without contrast due to CKD.

## 2017-06-25 NOTE — Progress Notes (Addendum)
Subjective:  Patient ID: Omar Benson, male    DOB: April 30, 1935  Age: 83 y.o. MRN: [id number]  CC: Abdominal Pain (pain on Lower abd/frequest urinate--felt like)   Abdominal Pain  This is a recurrent problem. The current episode started more than 1 month ago. The onset quality is sudden. The problem occurs intermittently. The problem has been waxing and waning. The pain is located in the suprapubic region. The quality of the pain is a sensation of fullness and dull. The abdominal pain does not radiate. Associated symptoms include frequency. Pertinent negatives include no anorexia, arthralgias, belching, constipation, diarrhea, dysuria, fever, flatus, headaches, hematochezia, hematuria, melena, myalgias, nausea, vomiting or weight loss. Nothing aggravates the pain. The pain is relieved by nothing.    Outpatient Medications Prior to Visit  Medication Sig Dispense Refill  . amiodarone (PACERONE) 200 MG tablet Take 0.5 tablets (100 mg total) by mouth daily. (Patient taking differently: Take 100 mg by mouth at bedtime. ) 45 tablet 3  . aspirin 81 MG tablet Take 1 tablet (81 mg total) by mouth at bedtime. 30 tablet 11  . b complex vitamins tablet Take 1 tablet by mouth daily.     . Cholecalciferol (VITAMIN D) 2000 UNITS CAPS Take 2,000 Units by mouth daily.     Marland Kitchen EPINEPHrine (EPIPEN) 0.3 mg/0.3 mL SOAJ injection Inject 0.3 mLs (0.3 mg total) into the muscle once. (Patient taking differently: Inject 0.3 mg into the muscle once as needed (severe allergic reaction). ) 2 Device 2  . ezetimibe (ZETIA) 10 MG tablet Take 1 tablet (10 mg total) by mouth daily. 90 tablet 3  . hydrALAZINE (APRESOLINE) 25 MG tablet Take 1.5 tablets (37.5 mg total) by mouth 2 (two) times daily. 90270 tablet 3  . levothyroxine (SYNTHROID, LEVOTHROID) 125 MCG tablet Take 1 tablet (125 mcg total) by mouth daily. 90 tablet 3  . Magnesium 250 MG TABS Take 250 mg by mouth daily.     . Multiple Vitamin (MULTIVITAMIN WITH MINERALS) TABS  tablet Take 1 tablet by mouth daily.     Marland Kitchen neomycin-bacitracin-polymyxin (NEOSPORIN) OINT Apply 1 application topically daily as needed for wound care.    . nitroGLYCERIN (NITROSTAT) 0.4 MG SL tablet Place 1 tablet (0.4 mg total) under the tongue every 5 (five) minutes as needed for chest pain. 25 tablet 2  . pantoprazole (PROTONIX) 40 MG tablet Take 1 tablet (40 mg total) by mouth daily. 90 tablet 3  . ticagrelor (BRILINTA) 90 MG TABS tablet Take 1 tablet (90 mg total) by mouth 2 (two) times daily. 60 tablet 0  . tiotropium (SPIRIVA HANDIHALER) 18 MCG inhalation capsule Place 1 capsule (18 mcg total) into inhaler and inhale daily. 90 capsule 3  . vitamin C (ASCORBIC ACID) 500 MG tablet Take 500 mg by mouth daily.     Marland Kitchen allopurinol (ZYLOPRIM) 300 MG tablet Take 1 tablet (300 mg total) by mouth daily. 90 tablet 3  . isosorbide mononitrate (IMDUR) 60 MG 24 hr tablet Take 1 tablet (60 mg total) by mouth daily. 90 tablet 3   No facility-administered medications prior to visit.     ROS See HPI  Objective:  BP 138/72   Pulse (!) 59   Temp (!) 97.5 F (36.4 C)   Ht 5\' 9"  (1.753 m)   Wt 185 lb (83.9 kg)   SpO2 97%   BMI 27.32 kg/m   BP Readings from Last 3 Encounters:  06/25/17 138/72  06/22/17 122/68  05/07/17 126/78  Wt Readings from Last 3 Encounters:  06/25/17 185 lb (83.9 kg)  06/22/17 184 lb (83.5 kg)  05/07/17 184 lb (83.5 kg)    Physical Exam  Constitutional: He is oriented to person, place, and time. No distress.  Pulmonary/Chest: Effort normal. No respiratory distress.  Abdominal: He exhibits no distension. There is tenderness. There is guarding.  Neurological: He is alert and oriented to person, place, and time.  Skin: Skin is warm and dry. No rash noted.  Psychiatric: He has a normal mood and affect. His behavior is normal.  Vitals reviewed.   Lab Results  Component Value Date   WBC 9.4 02/26/2017   HGB 12.7 (L) 02/26/2017   HCT 38.3 (L) 02/26/2017   PLT  234.0 02/26/2017   GLUCOSE 104 (H) 02/26/2017   CHOL 145 04/14/2017   TRIG 220 (H) 04/14/2017   HDL 42 04/14/2017   LDLDIRECT 84.0 06/18/2016   LDLCALC 59 04/14/2017   ALT 26 04/14/2017   AST 24 04/14/2017   NA 137 02/26/2017   K 4.7 02/26/2017   CL 103 02/26/2017   CREATININE 1.75 (H) 02/26/2017   BUN 26 (H) 02/26/2017   CO2 25 02/26/2017   TSH 1.19 06/18/2016   PSA 1.73 06/18/2016   INR 1.05 12/30/2016   HGBA1C 5.3 12/16/2016    Vas [place] Aaa Duplex  Result Date: 03/18/2017 ABDOMINAL AORTA STUDY Risk Factors: Hypertension, hyperlipidemia, past history of smoking, coronary               artery disease. Other: Patient complains of abdominal pain.  Limitations: Air/bowel gas and patient discomfort.  Examination Guidelines: A complete evaluation includes B-mode imaging, spectral doppler, color doppler, and power doppler as needed of all accessible portions of each vessel. Bilateral testing is considered an integral part of a complete examination. Limited examinations for reoccurring indications may be performed as noted.  Abdominal Aorta Findings: +-------------+-------+----------+----------+--------+--------+--------+ Location     AP (cm)Trans (cm)PSV (cm/s)WaveformThrombusComments +-------------+-------+----------+----------+--------+--------+--------+ Proximal     3.4    3.3       130                                +-------------+-------+----------+----------+--------+--------+--------+ Mid          2.8    2.7       71                                 +-------------+-------+----------+----------+--------+--------+--------+ Distal       2.7    2.6       -65                                +-------------+-------+----------+----------+--------+--------+--------+ RT CIA Prox  1.5    1.4       137                                +-------------+-------+----------+----------+--------+--------+--------+ RT CIA Mid                    362                                 +-------------+-------+----------+----------+--------+--------+--------+ RT CIA Distal  198                                +-------------+-------+----------+----------+--------+--------+--------+ RT EIA Prox                   95                                 +-------------+-------+----------+----------+--------+--------+--------+ RT EIA Distal0.9    0.9       122                                +-------------+-------+----------+----------+--------+--------+--------+ LT CIA Prox  1.2    1.2       181                                +-------------+-------+----------+----------+--------+--------+--------+ LT CIA Mid                    163                                +-------------+-------+----------+----------+--------+--------+--------+ LT CIA Distal                 148                                +-------------+-------+----------+----------+--------+--------+--------+ LT EIA Prox                   136                                +-------------+-------+----------+----------+--------+--------+--------+ LT EIA Distal1.0    0.9       160                                +-------------+-------+----------+----------+--------+--------+--------+  Visualization of the Left CIA Distal artery and Left EIA Proximal artery was limited.  Final Interpretation: Abdominal Aorta: There is evidence of abnormal dilitation of the Proximal Abdominal aorta. There is evidence of abnormal dilation of the Right Common Iliac artery. The largest aortic measurement is 3.4 cm. The largest aortic diameter has increased compared to prior exam. Previous diameter measurement was 2.8 cm obtained on 11/16. Stenosis: +------------------+-------------+ Location          Stenosis      +------------------+-------------+ Right Common Iliac>50% stenosis +------------------+-------------+  *See table(s) above for measurements and observations. Suggest follow up  study in 12 months. Electronically signed by Kathlyn Sacramento on 03/18/2017 at 4:53:07 PM.   Assessment & Plan:  I explained to Mr. Makar that despite hx of diverticulosis, his symptoms are less likely due to diverticulitis exacerbation. I informed him that this is more likely due to his prostate and/or bladder. I recommended CT ABD/pelvis and use of different oral antibiotics. He declined cipro and doxycyline prescription, stating he does not want to try anything new. He only want Augmentin since it has worked in past and had no adverse effects.  Noe was seen today for abdominal pain.  Diagnoses and all orders  for this visit:  Suprapubic abdominal pain -     CT Abdomen Pelvis Wo Contrast; Future -     Discontinue: amoxicillin-clavulanate (AUGMENTIN) 875-125 MG tablet; Take 1 tablet by mouth 2 (two) times daily. -     Ambulatory referral to Urology -     ciprofloxacin (CIPRO) 500 MG tablet; Take 1 tablet (500 mg total) by mouth 2 (two) times daily.  Urinary frequency -     POCT urinalysis dipstick -     Urine Culture -     CT Abdomen Pelvis Wo Contrast; Future -     Discontinue: amoxicillin-clavulanate (AUGMENTIN) 875-125 MG tablet; Take 1 tablet by mouth 2 (two) times daily. -     Ambulatory referral to Urology -     ciprofloxacin (CIPRO) 500 MG tablet; Take 1 tablet (500 mg total) by mouth 2 (two) times daily.  Chronic gout of multiple sites, unspecified cause -     allopurinol (ZYLOPRIM) 300 MG tablet; Take 1 tablet (300 mg total) by mouth daily.  Enlarged prostate with urinary retention -     Ambulatory referral to Urology -     ciprofloxacin (CIPRO) 500 MG tablet; Take 1 tablet (500 mg total) by mouth 2 (two) times daily.    I have discontinued Daemion A. Doerr's amoxicillin-clavulanate. I am also having him start on ciprofloxacin. Additionally, I am having him maintain his EPINEPHrine, Vitamin D, b complex vitamins, vitamin C, amiodarone, Magnesium, multivitamin with minerals,  neomycin-bacitracin-polymyxin, aspirin, ticagrelor, nitroGLYCERIN, ezetimibe, tiotropium, pantoprazole, levothyroxine, isosorbide mononitrate, hydrALAZINE, and allopurinol.  Meds ordered this encounter  Medications  . allopurinol (ZYLOPRIM) 300 MG tablet    Sig: Take 1 tablet (300 mg total) by mouth daily.    Dispense:  30 tablet    Refill:  0    Order Specific Question:   Supervising Provider    Answer:   Lucille Passy [[id number]]  . DISCONTD: amoxicillin-clavulanate (AUGMENTIN) 875-125 MG tablet    Sig: Take 1 tablet by mouth 2 (two) times daily.    Dispense:  8 tablet    Refill:  0    Order Specific Question:   Supervising Provider    Answer:   Lucille Passy [[id number]]  . ciprofloxacin (CIPRO) 500 MG tablet    Sig: Take 1 tablet (500 mg total) by mouth 2 (two) times daily.    Dispense:  14 tablet    Refill:  0    Order Specific Question:   Supervising Provider    Answer:   Lucille Passy [[id number]]    Follow-up: Return if symptoms worsen or fail to improve.  Wilfred Lacy, NP

## 2017-06-26 LAB — URINE CULTURE
MICRO NUMBER: 90172890
RESULT: NO GROWTH
SPECIMEN QUALITY: ADEQUATE

## 2017-06-28 ENCOUNTER — Ambulatory Visit (INDEPENDENT_AMBULATORY_CARE_PROVIDER_SITE_OTHER)
Admission: RE | Admit: 2017-06-28 | Discharge: 2017-06-28 | Disposition: A | Discharge status: Home / Self Care | Payer: Medicare Other | Source: Ambulatory Visit | Attending: Nurse Practitioner | Admitting: Nurse Practitioner

## 2017-06-28 DIAGNOSIS — R102 Pelvic and perineal pain: Secondary | ICD-10-CM

## 2017-06-28 DIAGNOSIS — R350 Frequency of micturition: Secondary | ICD-10-CM

## 2017-06-28 DIAGNOSIS — K5730 Diverticulosis of large intestine without perforation or abscess without bleeding: Secondary | ICD-10-CM | POA: Diagnosis not present

## 2017-06-29 ENCOUNTER — Encounter: Payer: Self-pay | Admitting: Nurse Practitioner

## 2017-06-29 MED ORDER — CIPROFLOXACIN HCL 500 MG PO TABS: 500.0000 mg | ORAL_TABLET | Freq: Two times a day (BID) | ORAL | 0 refills | Status: DC

## 2017-06-29 NOTE — Addendum Note (Signed)
Addended by: Wilfred Lacy L on: 06/29/2017 08:10 AM   Modules accepted: Orders

## 2017-07-13 DIAGNOSIS — R3911 Hesitancy of micturition: Secondary | ICD-10-CM | POA: Diagnosis not present

## 2017-07-13 DIAGNOSIS — R3912 Poor urinary stream: Secondary | ICD-10-CM | POA: Diagnosis not present

## 2017-07-13 DIAGNOSIS — N401 Enlarged prostate with lower urinary tract symptoms: Secondary | ICD-10-CM | POA: Diagnosis not present

## 2017-07-28 DIAGNOSIS — I700 Atherosclerosis of aorta: Secondary | ICD-10-CM | POA: Diagnosis not present

## 2017-07-28 DIAGNOSIS — Z6827 Body mass index (BMI) 27.0-27.9, adult: Secondary | ICD-10-CM | POA: Diagnosis not present

## 2017-07-28 DIAGNOSIS — I2510 Atherosclerotic heart disease of native coronary artery without angina pectoris: Secondary | ICD-10-CM | POA: Diagnosis not present

## 2017-07-28 DIAGNOSIS — N2581 Secondary hyperparathyroidism of renal origin: Secondary | ICD-10-CM | POA: Diagnosis not present

## 2017-07-28 DIAGNOSIS — D509 Iron deficiency anemia, unspecified: Secondary | ICD-10-CM | POA: Diagnosis not present

## 2017-07-28 DIAGNOSIS — N183 Chronic kidney disease, stage 3 (moderate): Secondary | ICD-10-CM | POA: Diagnosis not present

## 2017-07-28 DIAGNOSIS — I739 Peripheral vascular disease, unspecified: Secondary | ICD-10-CM | POA: Diagnosis not present

## 2017-07-28 DIAGNOSIS — J449 Chronic obstructive pulmonary disease, unspecified: Secondary | ICD-10-CM | POA: Diagnosis not present

## 2017-08-18 ENCOUNTER — Other Ambulatory Visit: Payer: Self-pay | Admitting: Internal Medicine

## 2017-08-19 DIAGNOSIS — H16042 Marginal corneal ulcer, left eye: Secondary | ICD-10-CM | POA: Diagnosis not present

## 2017-09-08 ENCOUNTER — Other Ambulatory Visit: Payer: Self-pay | Admitting: Internal Medicine

## 2017-09-13 DIAGNOSIS — D1801 Hemangioma of skin and subcutaneous tissue: Secondary | ICD-10-CM | POA: Diagnosis not present

## 2017-09-13 DIAGNOSIS — D485 Neoplasm of uncertain behavior of skin: Secondary | ICD-10-CM | POA: Diagnosis not present

## 2017-09-13 DIAGNOSIS — L570 Actinic keratosis: Secondary | ICD-10-CM | POA: Diagnosis not present

## 2017-09-13 DIAGNOSIS — C44319 Basal cell carcinoma of skin of other parts of face: Secondary | ICD-10-CM | POA: Diagnosis not present

## 2017-09-14 DIAGNOSIS — C44319 Basal cell carcinoma of skin of other parts of face: Secondary | ICD-10-CM | POA: Diagnosis not present

## 2017-09-14 DIAGNOSIS — D485 Neoplasm of uncertain behavior of skin: Secondary | ICD-10-CM | POA: Diagnosis not present

## 2017-09-29 DIAGNOSIS — C44319 Basal cell carcinoma of skin of other parts of face: Secondary | ICD-10-CM | POA: Diagnosis not present

## 2017-10-28 ENCOUNTER — Other Ambulatory Visit: Payer: Self-pay | Admitting: Internal Medicine

## 2017-10-28 DIAGNOSIS — I480 Paroxysmal atrial fibrillation: Secondary | ICD-10-CM

## 2017-11-02 ENCOUNTER — Encounter: Payer: Self-pay | Admitting: Internal Medicine

## 2017-11-02 ENCOUNTER — Ambulatory Visit (INDEPENDENT_AMBULATORY_CARE_PROVIDER_SITE_OTHER): Payer: Medicare Other | Admitting: Internal Medicine

## 2017-11-02 VITALS — BP 124/86 | HR 60 | Temp 97.7°F | Ht 69.0 in | Wt 185.0 lb

## 2017-11-02 DIAGNOSIS — J309 Allergic rhinitis, unspecified: Secondary | ICD-10-CM

## 2017-11-02 DIAGNOSIS — J069 Acute upper respiratory infection, unspecified: Secondary | ICD-10-CM | POA: Insufficient documentation

## 2017-11-02 DIAGNOSIS — R42 Dizziness and giddiness: Secondary | ICD-10-CM | POA: Diagnosis not present

## 2017-11-02 MED ORDER — MECLIZINE HCL 12.5 MG PO TABS: 12.5000 mg | ORAL_TABLET | Freq: Three times a day (TID) | ORAL | 1 refills | Status: DC | PRN

## 2017-11-02 MED ORDER — AMOXICILLIN 500 MG PO CAPS: 1000.0000 mg | ORAL_CAPSULE | Freq: Two times a day (BID) | ORAL | 0 refills | Status: DC

## 2017-11-02 MED ORDER — PREDNISONE 10 MG PO TABS: ORAL_TABLET | ORAL | 0 refills | Status: DC

## 2017-11-02 MED ORDER — DIAZEPAM 5 MG PO TABS: ORAL_TABLET | ORAL | 0 refills | Status: DC

## 2017-11-02 MED ORDER — FEXOFENADINE HCL 180 MG PO TABS: 180.0000 mg | ORAL_TABLET | Freq: Every day | ORAL | 2 refills | Status: DC
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
## 2017-11-02 NOTE — Assessment & Plan Note (Signed)
Ok for valium 5 qd x 3 days, also antivert prn,  to f/u any worsening symptoms or concerns

## 2017-11-02 NOTE — Assessment & Plan Note (Addendum)
Mild to mod seasonal worsening, for short course predpac asd, to add allegra, cont nasacort as he does

## 2017-11-02 NOTE — Assessment & Plan Note (Signed)
Mild to mod, for antibx course,  to f/u any worsening symptoms or concerns 

## 2017-11-02 NOTE — Patient Instructions (Addendum)
Please take all new medication as prescribed - the antibiotic, allegra, prednisone, antivert for dizziness, and valium as we discussed  You can also take Delsym OTC for cough, and/or Mucinex (or it's generic off brand) for congestion, and tylenol as needed for pain.  Please continue all other medications as before, and refills have been done if requested.  Please have the pharmacy call with any other refills you may need.  Please keep your appointments with your specialists as you may have planned

## 2017-11-08 ENCOUNTER — Encounter: Payer: Self-pay | Admitting: Internal Medicine

## 2017-11-08 ENCOUNTER — Ambulatory Visit (INDEPENDENT_AMBULATORY_CARE_PROVIDER_SITE_OTHER): Payer: Medicare Other | Admitting: Internal Medicine

## 2017-11-08 ENCOUNTER — Other Ambulatory Visit (INDEPENDENT_AMBULATORY_CARE_PROVIDER_SITE_OTHER): Payer: Medicare Other

## 2017-11-08 VITALS — BP 116/76 | HR 58 | Temp 97.7°F | Ht 69.0 in | Wt 184.0 lb

## 2017-11-08 DIAGNOSIS — E785 Hyperlipidemia, unspecified: Secondary | ICD-10-CM

## 2017-11-08 DIAGNOSIS — I10 Essential (primary) hypertension: Secondary | ICD-10-CM

## 2017-11-08 DIAGNOSIS — J449 Chronic obstructive pulmonary disease, unspecified: Secondary | ICD-10-CM

## 2017-11-08 DIAGNOSIS — N320 Bladder-neck obstruction: Secondary | ICD-10-CM | POA: Diagnosis not present

## 2017-11-08 LAB — BASIC METABOLIC PANEL
BUN: 31 mg/dL — ABNORMAL HIGH (ref 6–23)
CO2: 24 mEq/L (ref 19–32)
Calcium: 9.9 mg/dL (ref 8.4–10.5)
Chloride: 106 mEq/L (ref 96–112)
Creatinine, Ser: 1.74 mg/dL — ABNORMAL HIGH (ref 0.40–1.50)
GFR: 40.21 mL/min — AB (ref >60.00)
Glucose, Bld: 99 mg/dL (ref 70–99)
POTASSIUM: 4.8 meq/L (ref 3.5–5.1)
SODIUM: 138 meq/L (ref 135–145)

## 2017-11-08 LAB — URINALYSIS, ROUTINE W REFLEX MICROSCOPIC
Bilirubin Urine: NEGATIVE
HGB URINE DIPSTICK: NEGATIVE
Ketones, ur: NEGATIVE
Leukocytes, UA: NEGATIVE
NITRITE: NEGATIVE
RBC / HPF: NONE SEEN (ref 0–?)
SPECIFIC GRAVITY, URINE: 1.025 (ref 1.000–1.030)
Total Protein, Urine: NEGATIVE
URINE GLUCOSE: NEGATIVE
Urobilinogen, UA: 0.2 (ref 0.0–1.0)
pH: 5.5 (ref 5.0–8.0)

## 2017-11-08 LAB — PSA: PSA: 1.51 ng/mL (ref 0.10–4.00)

## 2017-11-08 LAB — LIPID PANEL
CHOLESTEROL: 161 mg/dL (ref 0–200)
HDL: 42.3 mg/dL (ref >39.00)
NonHDL: 118.62
TRIGLYCERIDES: 341 mg/dL — AB (ref 0.0–149.0)
Total CHOL/HDL Ratio: 4
VLDL: 68.2 mg/dL — AB (ref 0.0–40.0)

## 2017-11-08 LAB — CBC WITH DIFFERENTIAL/PLATELET
BASOS PCT: 1.2 % (ref 0.0–3.0)
Basophils Absolute: 0.1 10*3/uL (ref 0.0–0.1)
EOS ABS: 0.4 10*3/uL (ref 0.0–0.7)
Eosinophils Relative: 5.1 % — ABNORMAL HIGH (ref 0.0–5.0)
HCT: 41.4 % (ref 39.0–52.0)
HEMOGLOBIN: 14.1 g/dL (ref 13.0–17.0)
LYMPHS ABS: 1.6 10*3/uL (ref 0.7–4.0)
Lymphocytes Relative: 21.7 % (ref 12.0–46.0)
MCHC: 34.1 g/dL (ref 30.0–36.0)
MCV: 95.8 fl (ref 78.0–100.0)
MONO ABS: 0.9 10*3/uL (ref 0.1–1.0)
Monocytes Relative: 11.7 % (ref 3.0–12.0)
NEUTROS ABS: 4.5 10*3/uL (ref 1.4–7.7)
NEUTROS PCT: 60.3 % (ref 43.0–77.0)
PLATELETS: 223 10*3/uL (ref 150.0–400.0)
RBC: 4.32 Mil/uL (ref 4.22–5.81)
RDW: 14.5 % (ref 11.5–15.5)
WBC: 7.5 10*3/uL (ref 4.0–10.5)

## 2017-11-08 LAB — HEPATIC FUNCTION PANEL
ALBUMIN: 4.4 g/dL (ref 3.5–5.2)
ALT: 27 U/L (ref 0–53)
AST: 23 U/L (ref 0–37)
Alkaline Phosphatase: 99 U/L (ref 39–117)
Bilirubin, Direct: 0.2 mg/dL (ref 0.0–0.3)
Total Bilirubin: 0.8 mg/dL (ref 0.2–1.2)
Total Protein: 7.5 g/dL (ref 6.0–8.3)

## 2017-11-08 LAB — TSH: TSH: 1.42 u[IU]/mL (ref 0.35–4.50)

## 2017-11-08 LAB — LDL CHOLESTEROL, DIRECT: Direct LDL: 80 mg/dL

## 2017-11-08 NOTE — Assessment & Plan Note (Signed)
Stable, to cont albuterol MD
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
## 2017-11-08 NOTE — Assessment & Plan Note (Signed)
stable overall by history and exam, recent data reviewed with pt, and pt to continue medical treatment as before,  to f/u any worsening symptoms or concerns BP Readings from Last 3 Encounters:  11/08/17 116/76  11/02/17 124/86  06/25/17 138/72

## 2017-11-08 NOTE — Patient Instructions (Signed)

## 2017-11-08 NOTE — Assessment & Plan Note (Signed)
stable overall by history and exam, recent data reviewed with pt, and pt to continue medical treatment as before,  to f/u any worsening symptoms or concerns Lab Results  Component Value Date   LDLCALC 59 04/14/2017

## 2017-11-20 ENCOUNTER — Other Ambulatory Visit: Payer: Self-pay | Admitting: Internal Medicine

## 2017-11-24 DIAGNOSIS — C44319 Basal cell carcinoma of skin of other parts of face: Secondary | ICD-10-CM | POA: Diagnosis not present

## 2017-12-09 ENCOUNTER — Other Ambulatory Visit: Payer: Self-pay | Admitting: Internal Medicine

## 2018-01-10 ENCOUNTER — Other Ambulatory Visit: Payer: Self-pay | Admitting: Physician Assistant

## 2018-01-11 ENCOUNTER — Ambulatory Visit (INDEPENDENT_AMBULATORY_CARE_PROVIDER_SITE_OTHER): Payer: Medicare Other | Admitting: Internal Medicine

## 2018-01-11 ENCOUNTER — Encounter: Payer: Self-pay | Admitting: Internal Medicine

## 2018-01-11 VITALS — BP 130/72 | HR 54 | Ht 68.0 in | Wt 185.0 lb

## 2018-01-11 DIAGNOSIS — I25810 Atherosclerosis of coronary artery bypass graft(s) without angina pectoris: Secondary | ICD-10-CM | POA: Diagnosis not present

## 2018-01-11 DIAGNOSIS — E785 Hyperlipidemia, unspecified: Secondary | ICD-10-CM

## 2018-01-11 DIAGNOSIS — I480 Paroxysmal atrial fibrillation: Secondary | ICD-10-CM

## 2018-01-11 MED ORDER — FENOFIBRATE 54 MG PO TABS: 54.0000 mg | ORAL_TABLET | Freq: Every day | ORAL | 3 refills | Status: DC

## 2018-01-11 MED ORDER — ICOSAPENT ETHYL 0.5 G PO CAPS: 2.0000 g | ORAL_CAPSULE | Freq: Two times a day (BID) | ORAL | 11 refills | Status: DC

## 2018-01-11 MED ORDER — ICOSAPENT ETHYL 1 G PO CAPS: 2.0000 g | ORAL_CAPSULE | Freq: Two times a day (BID) | ORAL | 3 refills | Status: DC
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
## 2018-01-11 NOTE — Patient Instructions (Addendum)
Your physician has recommended you make the following change in your medication:  -- START vascepa (0.5 gram capsule) - take 4 capsules twice daily -- START fenofibrate 54mg  daily -- STOP brilinta  Your physician recommends that you return for lab work in [place] (fasting) to check cholesterol   Your physician wants you to follow-up in: [place] with Dr. Debara Pickett. You will receive a reminder letter in the mail two months in advance. If you don't receive a letter, please call our office to schedule the follow-up appointment.

## 2018-01-12 DIAGNOSIS — I700 Atherosclerosis of aorta: Secondary | ICD-10-CM | POA: Diagnosis not present

## 2018-01-12 DIAGNOSIS — D509 Iron deficiency anemia, unspecified: Secondary | ICD-10-CM | POA: Diagnosis not present

## 2018-01-12 DIAGNOSIS — I2510 Atherosclerotic heart disease of native coronary artery without angina pectoris: Secondary | ICD-10-CM | POA: Diagnosis not present

## 2018-01-12 DIAGNOSIS — N400 Enlarged prostate without lower urinary tract symptoms: Secondary | ICD-10-CM | POA: Diagnosis not present

## 2018-01-12 DIAGNOSIS — N2581 Secondary hyperparathyroidism of renal origin: Secondary | ICD-10-CM | POA: Diagnosis not present

## 2018-01-12 DIAGNOSIS — N183 Chronic kidney disease, stage 3 (moderate): Secondary | ICD-10-CM | POA: Diagnosis not present

## 2018-01-12 DIAGNOSIS — I739 Peripheral vascular disease, unspecified: Secondary | ICD-10-CM | POA: Diagnosis not present

## 2018-01-12 DIAGNOSIS — Z6827 Body mass index (BMI) 27.0-27.9, adult: Secondary | ICD-10-CM | POA: Diagnosis not present

## 2018-01-12 DIAGNOSIS — J449 Chronic obstructive pulmonary disease, unspecified: Secondary | ICD-10-CM | POA: Diagnosis not present

## 2018-01-13 ENCOUNTER — Telehealth: Payer: Self-pay | Admitting: Internal Medicine

## 2018-01-13 NOTE — Telephone Encounter (Signed)
Called [employer] and was notified patient does not need prior auth for Vascepa. The cost of the medication is $142.60/month.

## 2018-01-20 ENCOUNTER — Other Ambulatory Visit: Payer: Self-pay | Admitting: Internal Medicine

## 2018-01-20 ENCOUNTER — Other Ambulatory Visit: Payer: Self-pay | Admitting: Physician Assistant

## 2018-02-21 ENCOUNTER — Other Ambulatory Visit: Payer: Self-pay | Admitting: Internal Medicine

## 2018-03-14 ENCOUNTER — Other Ambulatory Visit: Payer: Self-pay | Admitting: Internal Medicine

## 2018-03-24 ENCOUNTER — Other Ambulatory Visit: Payer: Self-pay | Admitting: Internal Medicine

## 2018-04-19 DIAGNOSIS — D225 Melanocytic nevi of trunk: Secondary | ICD-10-CM | POA: Diagnosis not present

## 2018-04-19 DIAGNOSIS — L570 Actinic keratosis: Secondary | ICD-10-CM | POA: Diagnosis not present

## 2018-04-19 DIAGNOSIS — Z85828 Personal history of other malignant neoplasm of skin: Secondary | ICD-10-CM | POA: Diagnosis not present

## 2018-04-19 DIAGNOSIS — L820 Inflamed seborrheic keratosis: Secondary | ICD-10-CM | POA: Diagnosis not present

## 2018-04-19 DIAGNOSIS — L814 Other melanin hyperpigmentation: Secondary | ICD-10-CM | POA: Diagnosis not present

## 2018-04-19 DIAGNOSIS — Z23 Encounter for immunization: Secondary | ICD-10-CM | POA: Diagnosis not present

## 2018-04-19 DIAGNOSIS — L821 Other seborrheic keratosis: Secondary | ICD-10-CM | POA: Diagnosis not present

## 2018-04-20 ENCOUNTER — Encounter: Payer: Self-pay | Admitting: Physician Assistant

## 2018-04-20 ENCOUNTER — Ambulatory Visit (INDEPENDENT_AMBULATORY_CARE_PROVIDER_SITE_OTHER): Payer: Medicare Other | Admitting: Physician Assistant

## 2018-04-20 VITALS — BP 126/68 | HR 52 | Ht 68.0 in | Wt 189.2 lb

## 2018-04-20 DIAGNOSIS — R002 Palpitations: Secondary | ICD-10-CM

## 2018-04-20 DIAGNOSIS — I25810 Atherosclerosis of coronary artery bypass graft(s) without angina pectoris: Secondary | ICD-10-CM | POA: Diagnosis not present

## 2018-04-20 DIAGNOSIS — N183 Chronic kidney disease, stage 3 unspecified: Secondary | ICD-10-CM

## 2018-04-20 DIAGNOSIS — I480 Paroxysmal atrial fibrillation: Secondary | ICD-10-CM

## 2018-04-20 DIAGNOSIS — I10 Essential (primary) hypertension: Secondary | ICD-10-CM
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
## 2018-04-20 NOTE — Patient Instructions (Signed)
Medication Instructions:  Your physician has recommended the following:   [place] 1/2 TABLET OF AMIODARONE AS NEEDED FOR INCREASED SYMPTOMS OF PVCs   If you need a refill on your cardiac medications before your next appointment, please call your pharmacy.   Lab work: NONE If you have labs (blood work) drawn today and your tests are completely normal, you will receive your results only by: Marland Kitchen MyChart Message (if you have MyChart) OR . A paper copy in the mail If you have any lab test that is abnormal or we need to change your treatment, we will call you to review the results.  Testing/Procedures: NONE  Follow-Up: At [facility], you and your health needs are our priority.  As part of our continuing mission to provide you with exceptional heart care, we have created designated Provider Care Teams.  These Care Teams include your primary Cardiologist (physician) and Advanced Practice Providers (APPs -  Physician Assistants and Nurse Practitioners) who all work together to provide you with the care you need, when you need it. You will need a follow up appointment in 4 months WITH DR. HILTY.  Please call our office 2 months in advance to schedule this appointment.    Any Other Special Instructions Will Be Listed Below (If Applicable).  DECREASE ALCOHOL INTAKE.

## 2018-04-27 ENCOUNTER — Other Ambulatory Visit: Payer: Self-pay | Admitting: Internal Medicine

## 2018-05-24 ENCOUNTER — Other Ambulatory Visit: Payer: Self-pay | Admitting: Internal Medicine

## 2018-06-12 ENCOUNTER — Other Ambulatory Visit: Payer: Self-pay | Admitting: Internal Medicine

## 2018-06-12 DIAGNOSIS — M1A09X0 Idiopathic chronic gout, multiple sites, without tophus (tophi): Secondary | ICD-10-CM

## 2018-09-26 ENCOUNTER — Telehealth: Payer: Self-pay | Admitting: Internal Medicine

## 2018-09-26 NOTE — Telephone Encounter (Signed)
New message     Virtual video visit scheduled for 09-27-18.  Patient gave consent for video visit.  YOUR CARDIOLOGY TEAM HAS ARRANGED FOR AN E-VISIT FOR YOUR APPOINTMENT - PLEASE REVIEW IMPORTANT INFORMATION BELOW SEVERAL DAYS PRIOR TO YOUR APPOINTMENT  Due to the recent COVID-19 pandemic, we are transitioning in-person office visits to tele-medicine visits in an effort to decrease unnecessary exposure to our patients, their families, and staff. These visits are billed to your insurance just like a normal visit is. We also encourage you to sign up for MyChart if you have not already done so. You will need a smartphone if possible. For patients that do not have this, we can still complete the visit using a regular telephone but do prefer a smartphone to enable video when possible. You may have a family member that lives with you that can help. If possible, we also ask that you have a blood pressure cuff and scale at home to measure your blood pressure, heart rate and weight prior to your scheduled appointment. Patients with clinical needs that need an in-person evaluation and testing will still be able to come to the office if absolutely necessary. If you have any questions, feel free to call our office.     YOUR PROVIDER WILL BE USING THE FOLLOWING PLATFORM TO COMPLETE YOUR VISIT:  Doximity   IF USING MYCHART - How to Download the MyChart App to Your SmartPhone   - If Apple, go to [employer] and type in MyChart in the search bar and download the app. If Android, ask patient to go to [employer] and type in [place] in the search bar and download the app. The app is free but as with any other app downloads, your phone may require you to verify saved payment information or Apple/Android password.  - You will need to then log into the app with your MyChart username and password, and select [facility] as your healthcare provider to link the account.  - When it is time for your visit, go to the  MyChart app, find appointments, and click Begin Video Visit. Be sure to Select Allow for your device to access the Microphone and Camera for your visit. You will then be connected, and your provider will be with you shortly.  **If you have any issues connecting or need assistance, please contact MyChart service desk [phone])**  **If using a computer, in order to ensure the best quality for your visit, you will need to use either of the following Internet Browsers: Insurance underwriter or Microsoft Edge**   IF USING DOXIMITY or [email or web address] - The staff will give you instructions on receiving your link to join the meeting the day of your visit.      2-3 DAYS BEFORE YOUR APPOINTMENT  You will receive a telephone call from one of our [facility] team members - your caller ID may say "Unknown caller." If this is a video visit, we will walk you through how to get the video launched on your phone. We will remind you check your blood pressure, heart rate and weight prior to your scheduled appointment. If you have an Apple Watch or Kardia, please upload any pertinent ECG strips the day before or morning of your appointment to [facility]. Our staff will also make sure you have reviewed the consent and agree to move forward with your scheduled tele-health visit.     THE DAY OF YOUR APPOINTMENT  Approximately 15 minutes prior to your scheduled  appointment, you will receive a telephone call from one of [facility] team - your caller ID may say "Unknown caller."  Our staff will confirm medications, vital signs for the day and any symptoms you may be experiencing. Please have this information available prior to the time of visit start. It may also be helpful for you to have a pad of paper and pen handy for any instructions given during your visit. They will also walk you through joining the smartphone meeting if this is a video visit.    CONSENT FOR TELE-HEALTH VISIT - PLEASE REVIEW  I hereby voluntarily  request, consent and authorize [facility] and its employed or contracted physicians, physician assistants, nurse practitioners or other licensed health care professionals (the Practitioner), to provide me with telemedicine health care services (the Services") as deemed necessary by the treating Practitioner. I acknowledge and consent to receive the Services by the Practitioner via telemedicine. I understand that the telemedicine visit will involve communicating with the Practitioner through live audiovisual communication technology and the disclosure of certain medical information by electronic transmission. I acknowledge that I have been given the opportunity to request an in-person assessment or other available alternative prior to the telemedicine visit and am voluntarily participating in the telemedicine visit.  I understand that I have the right to withhold or withdraw my consent to the use of telemedicine in the course of my care at any time, without affecting my right to future care or treatment, and that the Practitioner or I may terminate the telemedicine visit at any time. I understand that I have the right to inspect all information obtained and/or recorded in the course of the telemedicine visit and may receive copies of available information for a reasonable fee.  I understand that some of the potential risks of receiving the Services via telemedicine include:   Delay or interruption in medical evaluation due to technological equipment failure or disruption;  Information transmitted may not be sufficient (e.g. poor resolution of images) to allow for appropriate medical decision making by the Practitioner; and/or   In rare instances, security protocols could fail, causing a breach of personal health information.  Furthermore, I acknowledge that it is my responsibility to provide information about my medical history, conditions and care that is complete and accurate to the best of my  ability. I acknowledge that Practitioner's advice, recommendations, and/or decision may be based on factors not within their control, such as incomplete or inaccurate data provided by me or distortions of diagnostic images or specimens that may result from electronic transmissions. I understand that the practice of medicine is not an exact science and that Practitioner makes no warranties or guarantees regarding treatment outcomes. I acknowledge that I will receive a copy of this consent concurrently upon execution via email to the email address I last provided but may also request a printed copy by calling the office of [facility].    I understand that my insurance will be billed for this visit.   I have read or had this consent read to me.  I understand the contents of this consent, which adequately explains the benefits and risks of the Services being provided via telemedicine.   I have been provided ample opportunity to ask questions regarding this consent and the Services and have had my questions answered to my satisfaction.  I give my informed consent for the services to be provided through the use of telemedicine in my medical care  By participating in this telemedicine  visit I agree to the above.

## 2018-09-27 ENCOUNTER — Encounter: Payer: Self-pay | Admitting: Internal Medicine

## 2018-09-27 ENCOUNTER — Telehealth (INDEPENDENT_AMBULATORY_CARE_PROVIDER_SITE_OTHER): Payer: Medicare Other | Admitting: Internal Medicine

## 2018-09-27 VITALS — BP 122/69 | HR 54 | Temp 97.0°F | Ht 68.0 in | Wt 180.5 lb

## 2018-09-27 DIAGNOSIS — I10 Essential (primary) hypertension: Secondary | ICD-10-CM

## 2018-09-27 DIAGNOSIS — I480 Paroxysmal atrial fibrillation: Secondary | ICD-10-CM | POA: Diagnosis not present

## 2018-09-27 DIAGNOSIS — E785 Hyperlipidemia, unspecified: Secondary | ICD-10-CM

## 2018-09-27 DIAGNOSIS — Z7189 Other specified counseling: Secondary | ICD-10-CM

## 2018-09-27 DIAGNOSIS — I25810 Atherosclerosis of coronary artery bypass graft(s) without angina pectoris: Secondary | ICD-10-CM | POA: Diagnosis not present

## 2018-09-27 DIAGNOSIS — Z79899 Other long term (current) drug therapy: Secondary | ICD-10-CM

## 2018-09-27 MED ORDER — AMIODARONE HCL 200 MG PO TABS: 100.0000 mg | ORAL_TABLET | Freq: Every day | ORAL | 3 refills | Status: DC

## 2018-09-27 MED ORDER — EZETIMIBE 10 MG PO TABS: 10.0000 mg | ORAL_TABLET | Freq: Every day | ORAL | 3 refills | Status: DC

## 2018-09-27 MED ORDER — ISOSORBIDE MONONITRATE ER 60 MG PO TB24: 60.0000 mg | ORAL_TABLET | Freq: Every day | ORAL | 3 refills | Status: DC

## 2018-09-27 MED ORDER — NITROGLYCERIN 0.4 MG SL SUBL: 0.4000 mg | SUBLINGUAL_TABLET | SUBLINGUAL | 3 refills | Status: DC | PRN

## 2018-09-27 MED ORDER — ICOSAPENT ETHYL 0.5 G PO CAPS: 4.0000 | ORAL_CAPSULE | Freq: Two times a day (BID) | ORAL | 11 refills | Status: DC
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
## 2018-09-27 NOTE — Patient Instructions (Signed)
Medication Instructions:  RESUME zetia 10mg  daily START vascepa 0.5mg  capsules - take 4 capsules twice daily Continue other medications If you need a refill on your cardiac medications before your next appointment, please call your pharmacy.   Lab work: FASTING lab work in 3 months - lipid panel, liver function, TSH (thyroid) If you have labs (blood work) drawn today and your tests are completely normal, you will receive your results only by: Marland Kitchen MyChart Message (if you have MyChart) OR . A paper copy in the mail If you have any lab test that is abnormal or we need to change your treatment, we will call you to review the results.  Testing/Procedures: NONE  Follow-Up: At [facility], you and your health needs are our priority.  As part of our continuing mission to provide you with exceptional heart care, we have created designated Provider Care Teams.  These Care Teams include your primary Cardiologist (physician) and Advanced Practice Providers (APPs -  Physician Assistants and Nurse Practitioners) who all work together to provide you with the care you need, when you need it. You will need a follow up appointment in 6 months.  Please call our office 2 months in advance to schedule this appointment.  You may see Dr. Debara Pickett or you will see one of the following Advanced Practice Providers on your designated Care Team: Almyra Deforest, [place] . Fabian Sharp, PA-C  Any Other Special Instructions Will Be Listed Below (If Applicable).

## 2018-10-29 ENCOUNTER — Other Ambulatory Visit: Payer: Self-pay | Admitting: Internal Medicine

## 2018-10-29 DIAGNOSIS — S61412A Laceration without foreign body of left hand, initial encounter: Secondary | ICD-10-CM | POA: Diagnosis not present

## 2018-11-10 ENCOUNTER — Ambulatory Visit (INDEPENDENT_AMBULATORY_CARE_PROVIDER_SITE_OTHER): Payer: Medicare Other | Admitting: Internal Medicine

## 2018-11-10 ENCOUNTER — Telehealth: Payer: Self-pay | Admitting: Physical Therapy

## 2018-11-10 ENCOUNTER — Other Ambulatory Visit (INDEPENDENT_AMBULATORY_CARE_PROVIDER_SITE_OTHER): Payer: Medicare Other

## 2018-11-10 ENCOUNTER — Encounter: Payer: Self-pay | Admitting: Internal Medicine

## 2018-11-10 ENCOUNTER — Other Ambulatory Visit: Payer: Self-pay

## 2018-11-10 VITALS — BP 128/78 | HR 55 | Temp 97.7°F | Ht 68.0 in | Wt 186.0 lb

## 2018-11-10 DIAGNOSIS — M25511 Pain in right shoulder: Secondary | ICD-10-CM | POA: Diagnosis not present

## 2018-11-10 DIAGNOSIS — E559 Vitamin D deficiency, unspecified: Secondary | ICD-10-CM | POA: Diagnosis not present

## 2018-11-10 DIAGNOSIS — E538 Deficiency of other specified B group vitamins: Secondary | ICD-10-CM | POA: Diagnosis not present

## 2018-11-10 DIAGNOSIS — D509 Iron deficiency anemia, unspecified: Secondary | ICD-10-CM

## 2018-11-10 DIAGNOSIS — R739 Hyperglycemia, unspecified: Secondary | ICD-10-CM

## 2018-11-10 DIAGNOSIS — E785 Hyperlipidemia, unspecified: Secondary | ICD-10-CM

## 2018-11-10 DIAGNOSIS — S61012D Laceration without foreign body of left thumb without damage to nail, subsequent encounter: Secondary | ICD-10-CM | POA: Diagnosis not present

## 2018-11-10 DIAGNOSIS — I25810 Atherosclerosis of coronary artery bypass graft(s) without angina pectoris: Secondary | ICD-10-CM

## 2018-11-10 DIAGNOSIS — E038 Other specified hypothyroidism: Secondary | ICD-10-CM | POA: Diagnosis not present

## 2018-11-10 LAB — BASIC METABOLIC PANEL
BUN: 31 mg/dL — ABNORMAL HIGH (ref 6–23)
CO2: 25 mEq/L (ref 19–32)
Calcium: 9.3 mg/dL (ref 8.4–10.5)
Chloride: 106 mEq/L (ref 96–112)
Creatinine, Ser: 1.68 mg/dL — ABNORMAL HIGH (ref 0.40–1.50)
GFR: 39.3 mL/min — ABNORMAL LOW (ref >60.00)
Glucose, Bld: 95 mg/dL (ref 70–99)
Potassium: 4.5 mEq/L (ref 3.5–5.1)
Sodium: 138 mEq/L (ref 135–145)

## 2018-11-10 LAB — TSH: TSH: 1.05 u[IU]/mL (ref 0.35–4.50)

## 2018-11-10 LAB — CBC WITH DIFFERENTIAL/PLATELET
Basophils Absolute: 0.1 10*3/uL (ref 0.0–0.1)
Basophils Relative: 1.1 % (ref 0.0–3.0)
Eosinophils Absolute: 0.5 10*3/uL (ref 0.0–0.7)
Eosinophils Relative: 8.3 % — ABNORMAL HIGH (ref 0.0–5.0)
HCT: 40.3 % (ref 39.0–52.0)
Hemoglobin: 13.3 g/dL (ref 13.0–17.0)
Lymphocytes Relative: 26.5 % (ref 12.0–46.0)
Lymphs Abs: 1.4 10*3/uL (ref 0.7–4.0)
MCHC: 33.1 g/dL (ref 30.0–36.0)
MCV: 96.4 fl (ref 78.0–100.0)
Monocytes Absolute: 0.6 10*3/uL (ref 0.1–1.0)
Monocytes Relative: 11.7 % (ref 3.0–12.0)
Neutro Abs: 2.9 10*3/uL (ref 1.4–7.7)
Neutrophils Relative %: 52.4 % (ref 43.0–77.0)
Platelets: 189 10*3/uL (ref 150.0–400.0)
RBC: 4.19 Mil/uL — ABNORMAL LOW (ref 4.22–5.81)
RDW: 14.9 % (ref 11.5–15.5)
WBC: 5.5 10*3/uL (ref 4.0–10.5)

## 2018-11-10 LAB — HEPATIC FUNCTION PANEL
ALT: 35 U/L (ref 0–53)
AST: 28 U/L (ref 0–37)
Albumin: 4.1 g/dL (ref 3.5–5.2)
Alkaline Phosphatase: 106 U/L (ref 39–117)
Bilirubin, Direct: 0.2 mg/dL (ref 0.0–0.3)
Total Bilirubin: 0.8 mg/dL (ref 0.2–1.2)
Total Protein: 7 g/dL (ref 6.0–8.3)

## 2018-11-10 LAB — URINALYSIS, ROUTINE W REFLEX MICROSCOPIC
Bilirubin Urine: NEGATIVE
Hgb urine dipstick: NEGATIVE
Ketones, ur: NEGATIVE
Leukocytes,Ua: NEGATIVE
Nitrite: NEGATIVE
RBC / HPF: NONE SEEN (ref 0–?)
Specific Gravity, Urine: 1.025 (ref 1.000–1.030)
Total Protein, Urine: NEGATIVE
Urine Glucose: NEGATIVE
Urobilinogen, UA: 0.2 (ref 0.0–1.0)
pH: 5.5 (ref 5.0–8.0)

## 2018-11-10 LAB — LIPID PANEL
Cholesterol: 158 mg/dL (ref 0–200)
HDL: 40.5 mg/dL (ref >39.00)
NonHDL: 117.91
Total CHOL/HDL Ratio: 4
Triglycerides: 272 mg/dL — ABNORMAL HIGH (ref 0.0–149.0)
VLDL: 54.4 mg/dL — ABNORMAL HIGH (ref 0.0–40.0)

## 2018-11-10 LAB — IBC PANEL
Iron: 71 ug/dL (ref 42–165)
Saturation Ratios: 18.9 % — ABNORMAL LOW (ref 20.0–50.0)
Transferrin: 268 mg/dL (ref 212.0–360.0)

## 2018-11-10 LAB — LDL CHOLESTEROL, DIRECT: Direct LDL: 85 mg/dL

## 2018-11-10 LAB — VITAMIN B12: Vitamin B-12: 341 pg/mL (ref 211–911)

## 2018-11-10 LAB — HEMOGLOBIN A1C: Hgb A1c MFr Bld: 5.8 % (ref 4.6–6.5)

## 2018-11-10 LAB — VITAMIN D 25 HYDROXY (VIT D DEFICIENCY, FRACTURES): VITD: 37.34 ng/mL (ref 30.00–100.00)

## 2018-11-10 NOTE — Telephone Encounter (Signed)
Spoke with patient regarding referral and "high risk" catgeory- he prefers to utilize telehealth for PT visits.  Wednesday Ericsson C. Aliannah Holstrom PT, DPT 11/10/18 3:52 PM
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
## 2018-11-10 NOTE — Patient Instructions (Signed)
Your Stitches were taken out today  You will be contacted regarding the referral for: Physical Therapy for the shoulder  Please make sure to call your Kidney doctor for follow up  Please continue all other medications as before, and refills have been done if requested.  Please have the pharmacy call with any other refills you may need.  Please continue your efforts at being more active, low cholesterol diet, and weight control  Please keep your appointments with your specialists as you may have planned  Please go to the LAB in the Basement (turn left off the elevator) for the tests to be done today  You will be contacted by phone if any changes need to be made immediately.  Otherwise, you will receive a letter about your results with an explanation, but please check with MyChart first.  Please remember to sign up for MyChart if you have not done so, as this will be important to you in the future with finding out test results, communicating by private email, and scheduling acute appointments online when needed.  Please return in 6 months, or sooner if needed, with Lab testing done 3-5 days before

## 2018-11-13 ENCOUNTER — Encounter: Payer: Self-pay | Admitting: Internal Medicine

## 2018-11-13 DIAGNOSIS — S61019A Laceration without foreign body of unspecified thumb without damage to nail, initial encounter: Secondary | ICD-10-CM | POA: Insufficient documentation

## 2018-11-13 NOTE — Assessment & Plan Note (Signed)
stable overall by history and exam, recent data reviewed with pt, and pt to continue medical treatment as before,  to f/u any worsening symptoms or concerns  

## 2018-11-13 NOTE — Assessment & Plan Note (Signed)
Stitches have been removed, to f/u any worsening symptoms or concerns

## 2018-11-13 NOTE — Assessment & Plan Note (Signed)
Also for iron panel with labs,  to f/u any worsening symptoms or concerns

## 2018-11-13 NOTE — Assessment & Plan Note (Addendum)
?   Rot cuff tendonitis, decline ortho or sport med referral, for PT referral  Note:  Total time for pt hx, exam, review of record with pt in the room, determination of diagnoses and plan for further eval and tx is > 40 min, with over 50% spent in coordination and counseling of patient including the differential dx, tx, further evaluation and other management of right shoulder pain, thumb laceration, hypothyroidism, hyeprglycemia, HLD, iron def anemia

## 2018-11-15 ENCOUNTER — Ambulatory Visit: Payer: Self-pay | Admitting: *Deleted

## 2018-11-15 ENCOUNTER — Ambulatory Visit (INDEPENDENT_AMBULATORY_CARE_PROVIDER_SITE_OTHER): Payer: Medicare Other | Admitting: Internal Medicine

## 2018-11-15 DIAGNOSIS — I25810 Atherosclerosis of coronary artery bypass graft(s) without angina pectoris: Secondary | ICD-10-CM | POA: Diagnosis not present

## 2018-11-15 DIAGNOSIS — Z20828 Contact with and (suspected) exposure to other viral communicable diseases: Secondary | ICD-10-CM | POA: Diagnosis not present

## 2018-11-15 DIAGNOSIS — Z20822 Contact with and (suspected) exposure to covid-19: Secondary | ICD-10-CM

## 2018-11-15 NOTE — Telephone Encounter (Signed)
Yes, ok for referral for covid testing

## 2018-11-15 NOTE — Telephone Encounter (Signed)
Opened in error

## 2018-11-15 NOTE — Telephone Encounter (Signed)
Pt called with having been exposed to his grandson on Father's Day. The grandson has now tested positive for the virus. He is not have any symptoms and neither does his grandson. He does have chronic medical conditions, hx of heart surgeries and emphysema. Advised him of having a virtual appointment with his provider first and then to get scheduled for the test. He always wear a mask and used gloves when he goes to a drive thru for food.  He voiced understanding. [facility] notified regarding an appointment and call transferred to the office. Triage routed to the practice.   Reason for Disposition . [1] COVID-19 EXPOSURE (Close Contact) within last 14 days AND [2] needs COVID-19 lab test to return to work AND [3] NO symptoms    Does not need to go to back to work but has chronic medical conditions and is 84 years old.  Answer Assessment - Initial Assessment Questions 1. CLOSE CONTACT: "Who is the person with the confirmed or suspected COVID-19 infection that you were exposed to?"     [place] son 2. PLACE of CONTACT: "Where were you when you were exposed to COVID-19?" (e.g., home, school, medical waiting room; which city?)     At daughter's house, he came in to visit for Father's Day 3. TYPE of CONTACT: "How much contact was there?" (e.g., sitting next to, live in same house, work in same office, same building)     In the area outside 4. DURATION of CONTACT: "How long were you in contact with the COVID-19 patient?" (e.g., a few seconds, passed by person, a few minutes, live with the patient)     Talking about 10 feet away 5. DATE of CONTACT: "When did you have contact with a COVID-19 patient?" (e.g., how many days ago)     A week ago 6. TRAVEL: "Have you traveled out of the country recently?" If so, "When and where?"     * Also ask about out-of-state travel, since the CDC has identified some high-risk cities for community spread in the [place].     * Note: Travel becomes less relevant if there  is widespread community transmission where the patient lives.     no 7. COMMUNITY SPREAD: "Are there lots of cases of COVID-19 (community spread) where you live?" (See public health department website, if unsure)       In the community 8. SYMPTOMS: "Do you have any symptoms?" (e.g., fever, cough, breathing difficulty)     no 9. PREGNANCY OR POSTPARTUM: "Is there any chance you are pregnant?" "When was your last menstrual period?" "Did you deliver in the last 2 weeks?"     no 10. HIGH RISK: "Do you have any heart or lung problems? Do you have a weak immune system?" (e.g., CHF, COPD, asthma, HIV positive, chemotherapy, renal failure, diabetes mellitus, sickle cell anemia)       Heart surgery, emphysema,  Protocols used: CORONAVIRUS (COVID-19) EXPOSURE-A-AH

## 2018-11-15 NOTE — Telephone Encounter (Signed)
Message sent to testing [facility] pool.
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
## 2018-11-15 NOTE — Patient Instructions (Signed)
You will be referred for COVID testing soon  Please continue all other medications as before, and refills have been done if requested.  Please have the pharmacy call with any other refills you may need.  Please keep your appointments with your specialists as you may have planned

## 2018-11-15 NOTE — Assessment & Plan Note (Signed)
Fortunately asympt at 8 days, but with obvious near close exposure, will refer for testing.

## 2018-11-16 ENCOUNTER — Other Ambulatory Visit: Payer: Medicare Other

## 2018-11-16 ENCOUNTER — Telehealth: Payer: Self-pay | Admitting: *Deleted

## 2018-11-16 DIAGNOSIS — Z20822 Contact with and (suspected) exposure to covid-19: Secondary | ICD-10-CM

## 2018-11-16 DIAGNOSIS — R6889 Other general symptoms and signs: Secondary | ICD-10-CM | POA: Diagnosis not present

## 2018-11-16 NOTE — Telephone Encounter (Signed)
Encounter opened in error

## 2018-11-16 NOTE — Telephone Encounter (Addendum)
Contacted pt to schedule test; pt accepted appointment at [facility] site 11/16/2018 at 1330; pt given address, location, and instructions that he and all occupants of his vehicle should wear masks; he verbalized understanding; orders placed per protocol. ----- Message from Cresenciano Lick, [place] sent at 11/15/2018  4:01 PM EDT ----- Regarding: COVID19 testing Dr. Jenny Reichmann wants patient tested due to known close exposure. Thanks!

## 2018-11-22 LAB — NOVEL CORONAVIRUS, NAA: SARS-CoV-2, NAA: NOT DETECTED

## 2018-11-29 ENCOUNTER — Encounter: Payer: Self-pay | Admitting: Internal Medicine

## 2018-11-29 MED ORDER — LEVOTHYROXINE SODIUM 125 MCG PO TABS: 125.0000 ug | ORAL_TABLET | Freq: Every day | ORAL | 1 refills | Status: DC

## 2018-12-12 ENCOUNTER — Encounter: Payer: Self-pay | Admitting: Internal Medicine

## 2018-12-12 DIAGNOSIS — M1A09X0 Idiopathic chronic gout, multiple sites, without tophus (tophi): Secondary | ICD-10-CM

## 2018-12-12 MED ORDER — PANTOPRAZOLE SODIUM 40 MG PO TBEC: 40.0000 mg | DELAYED_RELEASE_TABLET | Freq: Every day | ORAL | 1 refills | Status: DC

## 2018-12-12 MED ORDER — ALLOPURINOL 300 MG PO TABS: 300.0000 mg | ORAL_TABLET | Freq: Every day | ORAL | 1 refills | Status: DC

## 2019-03-27 ENCOUNTER — Encounter: Payer: Self-pay | Admitting: Internal Medicine

## 2019-03-27 DIAGNOSIS — H9203 Otalgia, bilateral: Secondary | ICD-10-CM

## 2019-03-27 DIAGNOSIS — R42 Dizziness and giddiness: Secondary | ICD-10-CM

## 2019-04-18 DIAGNOSIS — H9113 Presbycusis, bilateral: Secondary | ICD-10-CM | POA: Diagnosis not present

## 2019-04-18 DIAGNOSIS — H9313 Tinnitus, bilateral: Secondary | ICD-10-CM | POA: Diagnosis not present

## 2019-04-18 DIAGNOSIS — D490 Neoplasm of unspecified behavior of digestive system: Secondary | ICD-10-CM | POA: Diagnosis not present

## 2019-04-18 DIAGNOSIS — H8110 Benign paroxysmal vertigo, unspecified ear: Secondary | ICD-10-CM | POA: Diagnosis not present

## 2019-04-18 DIAGNOSIS — R42 Dizziness and giddiness: Secondary | ICD-10-CM | POA: Diagnosis not present

## 2019-04-18 DIAGNOSIS — H833X3 Noise effects on inner ear, bilateral: Secondary | ICD-10-CM | POA: Diagnosis not present

## 2019-04-18 DIAGNOSIS — H903 Sensorineural hearing loss, bilateral: Secondary | ICD-10-CM | POA: Diagnosis not present

## 2019-04-19 DIAGNOSIS — H9113 Presbycusis, bilateral: Secondary | ICD-10-CM | POA: Insufficient documentation

## 2019-04-19 DIAGNOSIS — H833X3 Noise effects on inner ear, bilateral: Secondary | ICD-10-CM | POA: Insufficient documentation

## 2019-04-19 DIAGNOSIS — H9313 Tinnitus, bilateral: Secondary | ICD-10-CM | POA: Insufficient documentation

## 2019-04-19 DIAGNOSIS — D490 Neoplasm of unspecified behavior of digestive system: Secondary | ICD-10-CM | POA: Insufficient documentation

## 2019-04-26 DIAGNOSIS — L821 Other seborrheic keratosis: Secondary | ICD-10-CM | POA: Diagnosis not present

## 2019-04-26 DIAGNOSIS — Z23 Encounter for immunization: Secondary | ICD-10-CM | POA: Diagnosis not present

## 2019-04-26 DIAGNOSIS — D225 Melanocytic nevi of trunk: Secondary | ICD-10-CM | POA: Diagnosis not present

## 2019-04-26 DIAGNOSIS — L814 Other melanin hyperpigmentation: Secondary | ICD-10-CM | POA: Diagnosis not present

## 2019-04-26 DIAGNOSIS — Z85828 Personal history of other malignant neoplasm of skin: Secondary | ICD-10-CM | POA: Diagnosis not present

## 2019-04-26 DIAGNOSIS — L570 Actinic keratosis: Secondary | ICD-10-CM | POA: Diagnosis not present

## 2019-05-15 ENCOUNTER — Other Ambulatory Visit: Payer: Self-pay

## 2019-05-15 ENCOUNTER — Ambulatory Visit (INDEPENDENT_AMBULATORY_CARE_PROVIDER_SITE_OTHER): Payer: Medicare Other | Admitting: Internal Medicine

## 2019-05-15 ENCOUNTER — Encounter: Payer: Self-pay | Admitting: Internal Medicine

## 2019-05-15 VITALS — Ht 69.0 in | Wt 184.0 lb

## 2019-05-15 DIAGNOSIS — E785 Hyperlipidemia, unspecified: Secondary | ICD-10-CM | POA: Diagnosis not present

## 2019-05-15 DIAGNOSIS — I25810 Atherosclerosis of coronary artery bypass graft(s) without angina pectoris: Secondary | ICD-10-CM | POA: Diagnosis not present

## 2019-05-15 DIAGNOSIS — I10 Essential (primary) hypertension: Secondary | ICD-10-CM

## 2019-05-15 DIAGNOSIS — R739 Hyperglycemia, unspecified: Secondary | ICD-10-CM | POA: Diagnosis not present

## 2019-05-15 DIAGNOSIS — N183 Chronic kidney disease, stage 3 unspecified: Secondary | ICD-10-CM

## 2019-05-15 LAB — HEMOGLOBIN A1C: Hgb A1c MFr Bld: 5.8 % (ref 4.6–6.5)

## 2019-05-15 LAB — BASIC METABOLIC PANEL
BUN: 30 mg/dL — ABNORMAL HIGH (ref 6–23)
CO2: 27 mEq/L (ref 19–32)
Calcium: 10 mg/dL (ref 8.4–10.5)
Chloride: 105 mEq/L (ref 96–112)
Creatinine, Ser: 1.8 mg/dL — ABNORMAL HIGH (ref 0.40–1.50)
GFR: 36.24 mL/min — ABNORMAL LOW (ref >60.00)
Glucose, Bld: 121 mg/dL — ABNORMAL HIGH (ref 70–99)
Potassium: 4.8 mEq/L (ref 3.5–5.1)
Sodium: 138 mEq/L (ref 135–145)

## 2019-05-15 LAB — LIPID PANEL
Cholesterol: 159 mg/dL (ref 0–200)
HDL: 39.4 mg/dL (ref >39.00)
NonHDL: 119.79
Total CHOL/HDL Ratio: 4
Triglycerides: 275 mg/dL — ABNORMAL HIGH (ref 0.0–149.0)
VLDL: 55 mg/dL — ABNORMAL HIGH (ref 0.0–40.0)

## 2019-05-15 LAB — HEPATIC FUNCTION PANEL
ALT: 28 U/L (ref 0–53)
AST: 31 U/L (ref 0–37)
Albumin: 4.2 g/dL (ref 3.5–5.2)
Alkaline Phosphatase: 109 U/L (ref 39–117)
Bilirubin, Direct: 0.1 mg/dL (ref 0.0–0.3)
Total Bilirubin: 0.6 mg/dL (ref 0.2–1.2)
Total Protein: 7.4 g/dL (ref 6.0–8.3)

## 2019-05-15 LAB — LDL CHOLESTEROL, DIRECT: Direct LDL: 81 mg/dL

## 2019-05-15 NOTE — Patient Instructions (Addendum)
Please continue all other medications as before, and refills have been done if requested.  Please have the pharmacy call with any other refills you may need.  Please continue your efforts at being more active, low cholesterol diet, and weight control.  Please keep your appointments with your specialists as you may have planned  Please return in 6 months, or sooner if needed 
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
## 2019-05-19 ENCOUNTER — Encounter: Payer: Self-pay | Admitting: Internal Medicine

## 2019-05-19 NOTE — Assessment & Plan Note (Signed)
stable overall by history and exam, recent data reviewed with pt, and pt to continue medical treatment as before,  to f/u any worsening symptoms or concerns  

## 2019-05-25 ENCOUNTER — Encounter: Payer: Self-pay | Admitting: Internal Medicine

## 2019-05-25 MED ORDER — LEVOTHYROXINE SODIUM 125 MCG PO TABS: 125.0000 ug | ORAL_TABLET | Freq: Every day | ORAL | 1 refills | Status: DC

## 2019-05-29 ENCOUNTER — Ambulatory Visit: Payer: Medicare Other | Attending: Internal Medicine

## 2019-05-29 DIAGNOSIS — Z23 Encounter for immunization: Secondary | ICD-10-CM | POA: Insufficient documentation

## 2019-05-29 NOTE — Progress Notes (Signed)
   Covid-19 Vaccination Clinic  Name:  Omar Benson    MRN: [id number] DOB: 01/20/1935  05/29/2019  [name] was observed post Covid-19 immunization for 30 minutes based on pre-vaccination screening without incidence. He was provided with Vaccine Information Sheet and instruction to access the V-Safe system.   [name] was instructed to call 911 with any severe reactions post vaccine: Marland Kitchen Difficulty breathing  . Swelling of your face and throat  . A fast heartbeat  . A bad rash all over your body  . Dizziness and weakness    Immunizations Administered    Name Date Dose VIS Date Route   Pfizer COVID-19 Vaccine 05/29/2019  1:04 PM 0.3 mL 04/28/2019 Intramuscular   Manufacturer: [place]   Lot: [id number]   [place]: [id number]

## 2019-06-04 ENCOUNTER — Other Ambulatory Visit: Payer: Self-pay | Admitting: Internal Medicine

## 2019-06-04 DIAGNOSIS — M1A09X0 Idiopathic chronic gout, multiple sites, without tophus (tophi): Secondary | ICD-10-CM

## 2019-06-04 NOTE — Telephone Encounter (Signed)
Please refill as per office routine med refill policy (all routine meds refilled for 3 mo or monthly per pt preference up to one year from last visit, then month to month grace period for 3 mo, then further med refills will have to be denied)  

## 2019-06-19 ENCOUNTER — Ambulatory Visit: Payer: PPO | Attending: Internal Medicine

## 2019-06-19 ENCOUNTER — Ambulatory Visit: Payer: Medicare Other

## 2019-06-19 DIAGNOSIS — Z23 Encounter for immunization: Secondary | ICD-10-CM | POA: Insufficient documentation

## 2019-06-19 NOTE — Progress Notes (Signed)
   Covid-19 Vaccination Clinic  Name:  LENA SANTILLI    MRN: [id number] DOB: 03/16/1935  06/19/2019  Mr. Jopp was observed post Covid-19 immunization for 15 minutes without incidence. He was provided with Vaccine Information Sheet and instruction to access the V-Safe system.   Mr. Iuliano was instructed to call 911 with any severe reactions post vaccine: Marland Kitchen Difficulty breathing  . Swelling of your face and throat  . A fast heartbeat  . A bad rash all over your body  . Dizziness and weakness    Immunizations Administered    Name Date Dose VIS Date Route   Pfizer COVID-19 Vaccine 06/19/2019  8:56 AM 0.3 mL 04/28/2019 Intramuscular   Manufacturer: [place]   Lot: [id number]   [place]: [id number]

## 2019-07-25 ENCOUNTER — Ambulatory Visit: Payer: Medicare Other | Admitting: Internal Medicine

## 2019-07-28 ENCOUNTER — Ambulatory Visit: Payer: PPO | Admitting: Internal Medicine

## 2019-07-28 ENCOUNTER — Other Ambulatory Visit: Payer: Self-pay

## 2019-07-28 ENCOUNTER — Encounter: Payer: Self-pay | Admitting: Internal Medicine

## 2019-07-28 VITALS — BP 120/70 | HR 55 | Temp 97.7°F | Ht 69.0 in | Wt 180.4 lb

## 2019-07-28 DIAGNOSIS — I480 Paroxysmal atrial fibrillation: Secondary | ICD-10-CM | POA: Diagnosis not present

## 2019-07-28 DIAGNOSIS — Z79899 Other long term (current) drug therapy: Secondary | ICD-10-CM | POA: Diagnosis not present

## 2019-07-28 DIAGNOSIS — I25810 Atherosclerosis of coronary artery bypass graft(s) without angina pectoris: Secondary | ICD-10-CM

## 2019-07-28 DIAGNOSIS — E785 Hyperlipidemia, unspecified: Secondary | ICD-10-CM | POA: Diagnosis not present

## 2019-07-28 NOTE — Patient Instructions (Signed)

## 2019-07-29 ENCOUNTER — Encounter: Payer: Self-pay | Admitting: Internal Medicine
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
## 2019-08-16 ENCOUNTER — Ambulatory Visit: Payer: PPO

## 2019-09-06 ENCOUNTER — Ambulatory Visit (INDEPENDENT_AMBULATORY_CARE_PROVIDER_SITE_OTHER): Payer: PPO

## 2019-09-06 ENCOUNTER — Other Ambulatory Visit: Payer: Self-pay

## 2019-09-06 VITALS — BP 128/70 | HR 53 | Temp 98.3°F | Resp 16 | Ht 69.0 in | Wt 185.0 lb

## 2019-09-06 DIAGNOSIS — Z0000 Encounter for general adult medical examination without abnormal findings: Secondary | ICD-10-CM | POA: Diagnosis not present
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
## 2019-09-06 NOTE — Patient Instructions (Signed)
Omar Benson , Thank you for taking time to come for your Medicare Wellness Visit. I appreciate your ongoing commitment to your health goals. Please review the following plan we discussed and let me know if I can assist you in the future.   Screening recommendations/referrals: Colorectal Screening: 11/05/2016  Vision and Dental Exams: Recommended annual ophthalmology exams for early detection of glaucoma and other disorders of the eye Recommended annual dental exams for proper oral hygiene  Vaccinations: Influenza vaccine: declined Pneumococcal vaccine: declined Tdap vaccine: declined.  Shingles vaccine: declined Covid vaccine: [name] 05/29/2019, 06/19/2019  Advanced directives: Advance directives discussed with you today. Please bring a copy of your POA (Power of [place]) and/or Living Will to your next appointment.  Goals:  Recommend to drink at least 6-8 8oz glasses of water per day.  Recommend to exercise for at least 150 minutes per week.  Recommend to remove any items from the home that may cause slips or trips.  Recommend to decrease portion sizes by eating 3 small healthy meals and at least 2 healthy snacks per day.  Recommend to begin DASH diet as directed below  Recommend to continue efforts to reduce smoking habits until no longer smoking. Smoking Cessation literature is attached below.  Next appointment: Please schedule your Annual Wellness Visit with your Nurse Health Advisor in one year.  Preventive Care 45 Years and Older, Male Preventive care refers to lifestyle choices and visits with your health care provider that can promote health and wellness. What does preventive care include?  A yearly physical exam. This is also called an annual well check.  Dental exams once or twice a year.  Routine eye exams. Ask your health care provider how often you should have your eyes checked.  Personal lifestyle choices, including:  Daily care of your teeth and gums.  Regular  physical activity.  Eating a healthy diet.  Avoiding tobacco and drug use.  Limiting alcohol use.  Practicing safe sex.  Taking low doses of aspirin every day if recommended by your health care provider..  Taking vitamin and mineral supplements as recommended by your health care provider. What happens during an annual well check? The services and screenings done by your health care provider during your annual well check will depend on your age, overall health, lifestyle risk factors, and family history of disease. Counseling  Your health care provider may ask you questions about your:  Alcohol use.  Tobacco use.  Drug use.  Emotional well-being.  Home and relationship well-being.  Sexual activity.  Eating habits.  History of falls.  Memory and ability to understand (cognition).  Work and work Statistician. Screening  You may have the following tests or measurements:  Height, weight, and BMI.  Blood pressure.  Lipid and cholesterol levels. These may be checked every 5 years, or more frequently if you are over 19 years old.  Skin check.  Lung cancer screening. You may have this screening every year starting at age 3 if you have a 30-pack-year history of smoking and currently smoke or have quit within the past 15 years.  Fecal occult blood test (FOBT) of the stool. You may have this test every year starting at age 30.  Flexible sigmoidoscopy or colonoscopy. You may have a sigmoidoscopy every 5 years or a colonoscopy every 10 years starting at age 19.  Prostate cancer screening. Recommendations will vary depending on your family history and other risks.  Hepatitis C blood test.  Hepatitis B blood test.  Sexually  transmitted disease (STD) testing.  Diabetes screening. This is done by checking your blood sugar (glucose) after you have not eaten for a while (fasting). You may have this done every 1-3 years.  Abdominal aortic aneurysm (AAA) screening. You may  need this if you are a current or former smoker.  Osteoporosis. You may be screened starting at age 36 if you are at high risk. Talk with your health care provider about your test results, treatment options, and if necessary, the need for more tests. Vaccines  Your health care provider may recommend certain vaccines, such as:  Influenza vaccine. This is recommended every year.  Tetanus, diphtheria, and acellular pertussis (Tdap, Td) vaccine. You may need a Td booster every 10 years.  Zoster vaccine. You may need this after age 51.  Pneumococcal 13-valent conjugate (PCV13) vaccine. One dose is recommended after age 87.  Pneumococcal polysaccharide (PPSV23) vaccine. One dose is recommended after age 86. Talk to your health care provider about which screenings and vaccines you need and how often you need them. This information is not intended to replace advice given to you by your health care provider. Make sure you discuss any questions you have with your health care provider. Document Released: 05/31/2015 Document Revised: 01/22/2016 Document Reviewed: 03/05/2015 Elsevier Interactive Patient Education  2017 [place] Prevention in the Home Falls can cause injuries. They can happen to people of all ages. There are many things you can do to make your home safe and to help prevent falls. What can I do on the outside of my home?  Regularly fix the edges of walkways and driveways and fix any cracks.  Remove anything that might make you trip as you walk through a door, such as a raised step or threshold.  Trim any bushes or trees on the path to your home.  Use bright outdoor lighting.  Clear any walking paths of anything that might make someone trip, such as rocks or tools.  Regularly check to see if handrails are loose or broken. Make sure that both sides of any steps have handrails.  Any raised decks and porches should have guardrails on the edges.  Have any leaves, snow,  or ice cleared regularly.  Use sand or salt on walking paths during winter.  Clean up any spills in your garage right away. This includes oil or grease spills. What can I do in the bathroom?  Use night lights.  Install grab bars by the toilet and in the tub and shower. Do not use towel bars as grab bars.  Use non-skid mats or decals in the tub or shower.  If you need to sit down in the shower, use a plastic, non-slip stool.  Keep the floor dry. Clean up any water that spills on the floor as soon as it happens.  Remove soap buildup in the tub or shower regularly.  Attach bath mats securely with double-sided non-slip rug tape.  Do not have throw rugs and other things on the floor that can make you trip. What can I do in the bedroom?  Use night lights.  Make sure that you have a light by your bed that is easy to reach.  Do not use any sheets or blankets that are too big for your bed. They should not hang down onto the floor.  Have a firm chair that has side arms. You can use this for support while you get dressed.  Do not have throw rugs and other things  on the floor that can make you trip. What can I do in the kitchen?  Clean up any spills right away.  Avoid walking on wet floors.  Keep items that you use a lot in easy-to-reach places.  If you need to reach something above you, use a strong step stool that has a grab bar.  Keep electrical cords out of the way.  Do not use floor polish or wax that makes floors slippery. If you must use wax, use non-skid floor wax.  Do not have throw rugs and other things on the floor that can make you trip. What can I do with my stairs?  Do not leave any items on the stairs.  Make sure that there are handrails on both sides of the stairs and use them. Fix handrails that are broken or loose. Make sure that handrails are as long as the stairways.  Check any carpeting to make sure that it is firmly attached to the stairs. Fix any  carpet that is loose or worn.  Avoid having throw rugs at the top or bottom of the stairs. If you do have throw rugs, attach them to the floor with carpet tape.  Make sure that you have a light switch at the top of the stairs and the bottom of the stairs. If you do not have them, ask someone to add them for you. What else can I do to help prevent falls?  Wear shoes that:  Do not have high heels.  Have rubber bottoms.  Are comfortable and fit you well.  Are closed at the toe. Do not wear sandals.  If you use a stepladder:  Make sure that it is fully opened. Do not climb a closed stepladder.  Make sure that both sides of the stepladder are locked into place.  Ask someone to hold it for you, if possible.  Clearly mark and make sure that you can see:  Any grab bars or handrails.  First and last steps.  Where the edge of each step is.  Use tools that help you move around (mobility aids) if they are needed. These include:  Canes.  Walkers.  Scooters.  Crutches.  Turn on the lights when you go into a dark area. Replace any light bulbs as soon as they burn out.  Set up your furniture so you have a clear path. Avoid moving your furniture around.  If any of your floors are uneven, fix them.  If there are any pets around you, be aware of where they are.  Review your medicines with your doctor. Some medicines can make you feel dizzy. This can increase your chance of falling. Ask your doctor what other things that you can do to help prevent falls. This information is not intended to replace advice given to you by your health care provider. Make sure you discuss any questions you have with your health care provider. Document Released: 02/28/2009 Document Revised: 10/10/2015 Document Reviewed: 06/08/2014 Elsevier Interactive Patient Education  2017 [employer].

## 2019-10-06 ENCOUNTER — Other Ambulatory Visit: Payer: Self-pay | Admitting: Internal Medicine

## 2019-10-09 NOTE — Telephone Encounter (Signed)
Rx request sent to pharmacy.  

## 2019-11-13 ENCOUNTER — Ambulatory Visit (INDEPENDENT_AMBULATORY_CARE_PROVIDER_SITE_OTHER): Payer: PPO | Admitting: Internal Medicine

## 2019-11-13 ENCOUNTER — Encounter: Payer: Self-pay | Admitting: Internal Medicine

## 2019-11-13 ENCOUNTER — Other Ambulatory Visit: Payer: Self-pay

## 2019-11-13 VITALS — BP 118/68 | HR 58 | Temp 98.1°F | Ht 69.0 in | Wt 184.0 lb

## 2019-11-13 DIAGNOSIS — R739 Hyperglycemia, unspecified: Secondary | ICD-10-CM | POA: Diagnosis not present

## 2019-11-13 DIAGNOSIS — E038 Other specified hypothyroidism: Secondary | ICD-10-CM

## 2019-11-13 DIAGNOSIS — N183 Chronic kidney disease, stage 3 unspecified: Secondary | ICD-10-CM | POA: Diagnosis not present

## 2019-11-13 DIAGNOSIS — E785 Hyperlipidemia, unspecified: Secondary | ICD-10-CM

## 2019-11-13 DIAGNOSIS — M1A09X0 Idiopathic chronic gout, multiple sites, without tophus (tophi): Secondary | ICD-10-CM

## 2019-11-13 LAB — LIPID PANEL
Cholesterol: 147 mg/dL (ref 0–200)
HDL: 40.3 mg/dL (ref >39.00)
LDL Cholesterol: 74 mg/dL (ref 0–99)
NonHDL: 106.55
Total CHOL/HDL Ratio: 4
Triglycerides: 163 mg/dL — ABNORMAL HIGH (ref 0.0–149.0)
VLDL: 32.6 mg/dL (ref 0.0–40.0)

## 2019-11-13 LAB — HEPATIC FUNCTION PANEL
ALT: 40 U/L (ref 0–53)
AST: 36 U/L (ref 0–37)
Albumin: 4.1 g/dL (ref 3.5–5.2)
Alkaline Phosphatase: 99 U/L (ref 39–117)
Bilirubin, Direct: 0.2 mg/dL (ref 0.0–0.3)
Total Bilirubin: 0.7 mg/dL (ref 0.2–1.2)
Total Protein: 7.2 g/dL (ref 6.0–8.3)

## 2019-11-13 LAB — BASIC METABOLIC PANEL
BUN: 30 mg/dL — ABNORMAL HIGH (ref 6–23)
CO2: 26 mEq/L (ref 19–32)
Calcium: 9.6 mg/dL (ref 8.4–10.5)
Chloride: 105 mEq/L (ref 96–112)
Creatinine, Ser: 1.85 mg/dL — ABNORMAL HIGH (ref 0.40–1.50)
GFR: 35.07 mL/min — ABNORMAL LOW (ref >60.00)
Glucose, Bld: 96 mg/dL (ref 70–99)
Potassium: 4.7 mEq/L (ref 3.5–5.1)
Sodium: 137 mEq/L (ref 135–145)

## 2019-11-13 LAB — HEMOGLOBIN A1C: Hgb A1c MFr Bld: 5.8 % (ref 4.6–6.5)

## 2019-11-13 MED ORDER — ISOSORBIDE MONONITRATE ER 60 MG PO TB24: 60.0000 mg | ORAL_TABLET | Freq: Every day | ORAL | 3 refills | Status: DC

## 2019-11-13 MED ORDER — PANTOPRAZOLE SODIUM 40 MG PO TBEC: DELAYED_RELEASE_TABLET | ORAL | 3 refills | Status: DC

## 2019-11-13 MED ORDER — ALLOPURINOL 300 MG PO TABS: ORAL_TABLET | ORAL | 3 refills | Status: DC

## 2019-11-13 MED ORDER — LEVOTHYROXINE SODIUM 125 MCG PO TABS: 125.0000 ug | ORAL_TABLET | Freq: Every day | ORAL | 1 refills | Status: DC
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
## 2019-11-13 NOTE — Patient Instructions (Signed)
Please continue all other medications as before, and refills have been done if requested.  Please have the pharmacy call with any other refills you may need.  Please continue your efforts at being more active, low cholesterol diet, and weight control  Please keep your appointments with your specialists as you may have planned  Please go to the LAB at the blood drawing area for the tests to be done  You will be contacted by phone if any changes need to be made immediately.  Otherwise, you will receive a letter about your results with an explanation, but please check with MyChart first.  Please remember to sign up for MyChart if you have not done so, as this will be important to you in the future with finding out test results, communicating by private email, and scheduling acute appointments online when needed.  Please make an Appointment to return in 6 months, or sooner if needed 

## 2019-11-14 ENCOUNTER — Encounter: Payer: Self-pay | Admitting: Internal Medicine

## 2019-11-14 ENCOUNTER — Other Ambulatory Visit: Payer: Self-pay | Admitting: Internal Medicine

## 2019-11-14 DIAGNOSIS — I480 Paroxysmal atrial fibrillation: Secondary | ICD-10-CM

## 2019-11-14 NOTE — Assessment & Plan Note (Signed)
Declines f/u with renal for now

## 2019-11-14 NOTE — Assessment & Plan Note (Addendum)
stable overall by history and exam, recent data reviewed with pt, and pt to continue medical treatment as before,  to f/u any worsening symptoms or concerns  I spent 31 minutes in preparing to see the patient by review of recent labs, imaging and procedures, obtaining and reviewing separately obtained history, communicating with the patient and family or caregiver, ordering medications, tests or procedures, and documenting clinical information in the EHR including the differential Dx, treatment, and any further evaluation and other management of hyperglycemia, ckd, gout, hld, hypothyroid

## 2019-11-14 NOTE — Assessment & Plan Note (Signed)
stable overall by history and exam, recent data reviewed with pt, and pt to continue medical treatment as before,  to f/u any worsening symptoms or concerns  

## 2019-11-15 ENCOUNTER — Other Ambulatory Visit: Payer: Self-pay | Admitting: Internal Medicine

## 2019-11-15 NOTE — Telephone Encounter (Signed)
Medication was refilled by Dr Jenny Reichmann on 11/13/19 & receipt was confirmed by pharmacy on that same date.  Pt called & message left to f/u with pharmacy if needed.

## 2020-01-01 DIAGNOSIS — L820 Inflamed seborrheic keratosis: Secondary | ICD-10-CM | POA: Diagnosis not present

## 2020-01-01 DIAGNOSIS — D485 Neoplasm of uncertain behavior of skin: Secondary | ICD-10-CM | POA: Diagnosis not present

## 2020-01-01 DIAGNOSIS — L570 Actinic keratosis: Secondary | ICD-10-CM | POA: Diagnosis not present

## 2020-01-01 DIAGNOSIS — C44319 Basal cell carcinoma of skin of other parts of face: Secondary | ICD-10-CM | POA: Diagnosis not present

## 2020-03-21 DIAGNOSIS — C44319 Basal cell carcinoma of skin of other parts of face: Secondary | ICD-10-CM | POA: Diagnosis not present

## 2020-03-21 DIAGNOSIS — L570 Actinic keratosis: Secondary | ICD-10-CM | POA: Diagnosis not present

## 2020-05-07 ENCOUNTER — Other Ambulatory Visit: Payer: Self-pay

## 2020-05-07 ENCOUNTER — Encounter: Payer: Self-pay | Admitting: Internal Medicine

## 2020-05-07 ENCOUNTER — Ambulatory Visit (INDEPENDENT_AMBULATORY_CARE_PROVIDER_SITE_OTHER): Payer: PPO | Admitting: Internal Medicine

## 2020-05-07 VITALS — BP 120/80 | HR 52 | Temp 97.8°F | Ht 69.0 in | Wt 188.0 lb

## 2020-05-07 DIAGNOSIS — R011 Cardiac murmur, unspecified: Secondary | ICD-10-CM | POA: Diagnosis not present

## 2020-05-07 DIAGNOSIS — E038 Other specified hypothyroidism: Secondary | ICD-10-CM

## 2020-05-07 DIAGNOSIS — I10 Essential (primary) hypertension: Secondary | ICD-10-CM

## 2020-05-07 DIAGNOSIS — E785 Hyperlipidemia, unspecified: Secondary | ICD-10-CM

## 2020-05-07 DIAGNOSIS — N1832 Chronic kidney disease, stage 3b: Secondary | ICD-10-CM

## 2020-05-07 DIAGNOSIS — R739 Hyperglycemia, unspecified: Secondary | ICD-10-CM | POA: Diagnosis not present

## 2020-05-07 DIAGNOSIS — Z0000 Encounter for general adult medical examination without abnormal findings: Secondary | ICD-10-CM

## 2020-05-07 LAB — URINALYSIS, ROUTINE W REFLEX MICROSCOPIC
Bilirubin Urine: NEGATIVE
Hgb urine dipstick: NEGATIVE
Ketones, ur: NEGATIVE
Leukocytes,Ua: NEGATIVE
Nitrite: NEGATIVE
RBC / HPF: NONE SEEN (ref 0–?)
Specific Gravity, Urine: >=1.03 — AB (ref 1.000–1.030)
Total Protein, Urine: NEGATIVE
Urine Glucose: NEGATIVE
Urobilinogen, UA: 0.2 (ref 0.0–1.0)
pH: 5.5 (ref 5.0–8.0)

## 2020-05-07 LAB — CBC WITH DIFFERENTIAL/PLATELET
Basophils Absolute: 0.1 10*3/uL (ref 0.0–0.1)
Basophils Relative: 1 % (ref 0.0–3.0)
Eosinophils Absolute: 0.3 10*3/uL (ref 0.0–0.7)
Eosinophils Relative: 5.7 % — ABNORMAL HIGH (ref 0.0–5.0)
HCT: 40.3 % (ref 39.0–52.0)
Hemoglobin: 13.4 g/dL (ref 13.0–17.0)
Lymphocytes Relative: 27.2 % (ref 12.0–46.0)
Lymphs Abs: 1.5 10*3/uL (ref 0.7–4.0)
MCHC: 33.2 g/dL (ref 30.0–36.0)
MCV: 96.3 fl (ref 78.0–100.0)
Monocytes Absolute: 0.6 10*3/uL (ref 0.1–1.0)
Monocytes Relative: 10.1 % (ref 3.0–12.0)
Neutro Abs: 3.1 10*3/uL (ref 1.4–7.7)
Neutrophils Relative %: 56 % (ref 43.0–77.0)
Platelets: 193 10*3/uL (ref 150.0–400.0)
RBC: 4.19 Mil/uL — ABNORMAL LOW (ref 4.22–5.81)
RDW: 13.9 % (ref 11.5–15.5)
WBC: 5.6 10*3/uL (ref 4.0–10.5)

## 2020-05-07 LAB — HEPATIC FUNCTION PANEL
ALT: 40 U/L (ref 0–53)
AST: 38 U/L — ABNORMAL HIGH (ref 0–37)
Albumin: 4.3 g/dL (ref 3.5–5.2)
Alkaline Phosphatase: 98 U/L (ref 39–117)
Bilirubin, Direct: 0.1 mg/dL (ref 0.0–0.3)
Total Bilirubin: 0.7 mg/dL (ref 0.2–1.2)
Total Protein: 7.7 g/dL (ref 6.0–8.3)

## 2020-05-07 LAB — BASIC METABOLIC PANEL
BUN: 30 mg/dL — ABNORMAL HIGH (ref 6–23)
CO2: 27 mEq/L (ref 19–32)
Calcium: 10.1 mg/dL (ref 8.4–10.5)
Chloride: 107 mEq/L (ref 96–112)
Creatinine, Ser: 1.81 mg/dL — ABNORMAL HIGH (ref 0.40–1.50)
GFR: 34.12 mL/min — ABNORMAL LOW (ref >60.00)
Glucose, Bld: 99 mg/dL (ref 70–99)
Potassium: 5.4 mEq/L — ABNORMAL HIGH (ref 3.5–5.1)
Sodium: 139 mEq/L (ref 135–145)

## 2020-05-07 LAB — LIPID PANEL
Cholesterol: 154 mg/dL (ref 0–200)
HDL: 45.9 mg/dL (ref >39.00)
NonHDL: 108.11
Total CHOL/HDL Ratio: 3
Triglycerides: 201 mg/dL — ABNORMAL HIGH (ref 0.0–149.0)
VLDL: 40.2 mg/dL — ABNORMAL HIGH (ref 0.0–40.0)

## 2020-05-07 LAB — TSH: TSH: 1.24 u[IU]/mL (ref 0.35–4.50)

## 2020-05-07 LAB — HEMOGLOBIN A1C: Hgb A1c MFr Bld: 5.8 % (ref 4.6–6.5)

## 2020-05-07 LAB — LDL CHOLESTEROL, DIRECT: Direct LDL: 83 mg/dL

## 2020-05-07 NOTE — Progress Notes (Deleted)
   Subjective:    Patient ID: Omar Benson, male    DOB: 04-21-1935, 85 y.o.   MRN: [id number]  HPI    Wt Readings from Last 3 Encounters:  05/07/20 188 lb (85.3 kg)  11/13/19 184 lb (83.5 kg)  09/06/19 185 lb (83.9 kg)  Booster to be done tomorrow.  n    Review of Systems     Objective:   Physical Exam  Gr 2/6 sys murmur rusb      Assessment & Plan:

## 2020-05-07 NOTE — Patient Instructions (Signed)
Please continue all other medications as before, and refills have been done if requested.  Please have the pharmacy call with any other refills you may need.  Please continue your efforts at being more active, low cholesterol diet, and weight control.  You are otherwise up to date with prevention measures today.  Please keep your appointments with your specialists as you may have planned  You will be contacted regarding the referral for: Dr "C" at [facility]  Please go to the LAB at the blood drawing area for the tests to be done  You will be contacted by phone if any changes need to be made immediately.  Otherwise, you will receive a letter about your results with an explanation, but please check with MyChart first.  Please remember to sign up for MyChart if you have not done so, as this will be important to you in the future with finding out test results, communicating by private email, and scheduling acute appointments online when needed.  Please make an Appointment to return in 6 months, or sooner if needed

## 2020-05-09 ENCOUNTER — Other Ambulatory Visit: Payer: Self-pay | Admitting: Internal Medicine

## 2020-05-09 DIAGNOSIS — I480 Paroxysmal atrial fibrillation: Secondary | ICD-10-CM

## 2020-05-11 ENCOUNTER — Other Ambulatory Visit: Payer: Self-pay | Admitting: Internal Medicine

## 2020-05-12 NOTE — Telephone Encounter (Signed)
Please refill as per office routine med refill policy (all routine meds refilled for 3 mo or monthly per pt preference up to one year from last visit, then month to month grace period for 3 mo, then further med refills will have to be denied)  

## 2020-05-14 ENCOUNTER — Ambulatory Visit: Payer: PPO | Admitting: Internal Medicine

## 2020-05-14 ENCOUNTER — Encounter: Payer: Self-pay | Admitting: Internal Medicine

## 2020-05-14 DIAGNOSIS — R011 Cardiac murmur, unspecified: Secondary | ICD-10-CM | POA: Insufficient documentation
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
## 2020-05-14 NOTE — Assessment & Plan Note (Signed)
stable overall by history and exam, recent data reviewed with pt, and pt to continue medical treatment as before,  to f/u any worsening symptoms or concerns Lab Results  Component Value Date   HGBA1C 5.8 05/07/2020

## 2020-05-14 NOTE — Assessment & Plan Note (Signed)

## 2020-05-14 NOTE — Assessment & Plan Note (Addendum)
Lab Results  Component Value Date   CREATININE 1.81 (H) 05/07/2020  stable overall by history and exam, recent data reviewed with pt, and pt to continue medical treatment as before,  to f/u any worsening symptoms or concerns, also refer back to Dr Lewis Moccasin renal as pt has been lost to f/u for 2 yrs

## 2020-05-14 NOTE — Assessment & Plan Note (Signed)
BP Readings from Last 3 Encounters:  05/07/20 120/80  11/13/19 118/68  09/06/19 128/70  stable overall by history and exam, recent data reviewed with pt, and pt to continue medical treatment as before,  to f/u any worsening symptoms or concerns

## 2020-05-14 NOTE — Assessment & Plan Note (Signed)
Lab Results  Component Value Date   LDLCALC 74 11/13/2019  stable overall by history and exam, recent data reviewed with pt, and pt to continue medical treatment as before,  to f/u any worsening symptoms or concerns

## 2020-05-14 NOTE — Assessment & Plan Note (Signed)
Cant r/o worsening AS but asympt, pt declines f/u echo

## 2020-05-14 NOTE — Assessment & Plan Note (Signed)
stable overall by history and exam, recent data reviewed with pt, and pt to continue medical treatment as before,  to f/u any worsening symptoms or concerns Lab Results  Component Value Date   TSH 1.24 05/07/2020

## 2020-08-09 ENCOUNTER — Other Ambulatory Visit: Payer: Self-pay | Admitting: Internal Medicine

## 2020-09-05 ENCOUNTER — Ambulatory Visit: Payer: PPO

## 2020-09-19 ENCOUNTER — Other Ambulatory Visit: Payer: Self-pay

## 2020-09-19 ENCOUNTER — Ambulatory Visit: Payer: PPO | Admitting: Physician Assistant

## 2020-09-19 ENCOUNTER — Encounter: Payer: Self-pay | Admitting: Physician Assistant

## 2020-09-19 VITALS — BP 134/70 | HR 56 | Ht 69.0 in | Wt 186.8 lb

## 2020-09-19 DIAGNOSIS — N183 Chronic kidney disease, stage 3 unspecified: Secondary | ICD-10-CM

## 2020-09-19 DIAGNOSIS — I2510 Atherosclerotic heart disease of native coronary artery without angina pectoris: Secondary | ICD-10-CM

## 2020-09-19 DIAGNOSIS — E785 Hyperlipidemia, unspecified: Secondary | ICD-10-CM

## 2020-09-19 DIAGNOSIS — I10 Essential (primary) hypertension: Secondary | ICD-10-CM

## 2020-09-19 DIAGNOSIS — I480 Paroxysmal atrial fibrillation: Secondary | ICD-10-CM

## 2020-09-19 DIAGNOSIS — I25810 Atherosclerosis of coronary artery bypass graft(s) without angina pectoris: Secondary | ICD-10-CM | POA: Diagnosis not present

## 2020-09-19 NOTE — Patient Instructions (Signed)
Medication Instructions:   HOLD  Ezetimibe (Zetia) 10 mg for 3 weeks. If your chest discomfort continues after being off medication Restart. If after 3 weeks and the chest discomfort dose not occur give our office a call to discuss a PCSK9 inhibitor.  *If you need a refill on your cardiac medications before your next appointment, please call your pharmacy*  Lab Work: NONE ordered at this time of appointment   If you have labs (blood work) drawn today and your tests are completely normal, you will receive your results only by: Marland Kitchen MyChart Message (if you have MyChart) OR . A paper copy in the mail If you have any lab test that is abnormal or we need to change your treatment, we will call you to review the results.  Testing/Procedures: NONE ordered at this time of appointment   Follow-Up: At [facility], you and your health needs are our priority.  As part of our continuing mission to provide you with exceptional heart care, we have created designated Provider Care Teams.  These Care Teams include your primary Cardiologist (physician) and Advanced Practice Providers (APPs -  Physician Assistants and Nurse Practitioners) who all work together to provide you with the care you need, when you need it.  We recommend signing up for the patient portal called "MyChart".  Sign up information is provided on this After Visit Summary.  MyChart is used to connect with patients for Virtual Visits (Telemedicine).  Patients are able to view lab/test results, encounter notes, upcoming appointments, etc.  Non-urgent messages can be sent to your provider as well.   To learn more about what you can do with MyChart, go to [email or web address].    Your next appointment:   1 year(s)  The format for your next appointment:   In Person  Provider:   K. Mali Hilty, MD  Other Instructions
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
## 2020-09-21 ENCOUNTER — Encounter: Payer: Self-pay | Admitting: Physician Assistant

## 2020-09-23 ENCOUNTER — Telehealth: Payer: Self-pay | Admitting: Internal Medicine

## 2020-09-23 NOTE — Telephone Encounter (Signed)
Called pt to schedule AWV with [facility]. Patient didn't answer.

## 2020-10-02 ENCOUNTER — Ambulatory Visit (INDEPENDENT_AMBULATORY_CARE_PROVIDER_SITE_OTHER): Payer: PPO | Admitting: Internal Medicine

## 2020-10-02 ENCOUNTER — Encounter: Payer: Self-pay | Admitting: Internal Medicine

## 2020-10-02 ENCOUNTER — Other Ambulatory Visit: Payer: Self-pay

## 2020-10-02 VITALS — BP 120/72 | HR 51 | Temp 98.5°F | Ht 69.0 in | Wt 184.0 lb

## 2020-10-02 DIAGNOSIS — I10 Essential (primary) hypertension: Secondary | ICD-10-CM

## 2020-10-02 DIAGNOSIS — R739 Hyperglycemia, unspecified: Secondary | ICD-10-CM | POA: Diagnosis not present

## 2020-10-02 DIAGNOSIS — L03116 Cellulitis of left lower limb: Secondary | ICD-10-CM | POA: Diagnosis not present

## 2020-10-02 MED ORDER — AMOXICILLIN 500 MG PO CAPS: 500.0000 mg | ORAL_CAPSULE | Freq: Three times a day (TID) | ORAL | 0 refills | Status: AC

## 2020-10-02 MED ORDER — TRIAMCINOLONE ACETONIDE 0.1 % EX CREA: 1.0000 "application " | TOPICAL_CREAM | Freq: Two times a day (BID) | CUTANEOUS | 0 refills | Status: DC

## 2020-10-02 NOTE — Patient Instructions (Signed)
Please take all new medication as prescribed - the antibiotic, and steroid cream as needed  Please continue all other medications as before, and refills have been done if requested.  Please have the pharmacy call with any other refills you may need.  Please continue your efforts at being more active, low cholesterol diet, and weight control.  Please keep your appointments with your specialists as you may have planned
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
## 2020-10-04 ENCOUNTER — Ambulatory Visit: Payer: PPO

## 2020-10-05 ENCOUNTER — Encounter: Payer: Self-pay | Admitting: Internal Medicine

## 2020-10-05 DIAGNOSIS — L0390 Cellulitis, unspecified: Secondary | ICD-10-CM | POA: Insufficient documentation

## 2020-10-05 NOTE — Assessment & Plan Note (Signed)
BP Readings from Last 3 Encounters:  10/02/20 120/72  09/19/20 134/70  05/07/20 120/80   Stable, pt to continue medical treatment - currently not on vasoactive med

## 2020-10-05 NOTE — Assessment & Plan Note (Signed)
Mild to mod, for antibx course,  to f/u any worsening symptoms or concerns 

## 2020-10-05 NOTE — Assessment & Plan Note (Signed)
Lab Results  Component Value Date   HGBA1C 5.8 05/07/2020   Stable, pt to continue current medical treatment  - diet

## 2020-11-04 ENCOUNTER — Other Ambulatory Visit: Payer: Self-pay

## 2020-11-05 ENCOUNTER — Ambulatory Visit (INDEPENDENT_AMBULATORY_CARE_PROVIDER_SITE_OTHER): Payer: PPO | Admitting: Internal Medicine

## 2020-11-05 ENCOUNTER — Encounter: Payer: Self-pay | Admitting: Internal Medicine

## 2020-11-05 VITALS — BP 150/98 | HR 49 | Temp 97.6°F | Ht 69.0 in | Wt 184.0 lb

## 2020-11-05 DIAGNOSIS — N1832 Chronic kidney disease, stage 3b: Secondary | ICD-10-CM

## 2020-11-05 DIAGNOSIS — E785 Hyperlipidemia, unspecified: Secondary | ICD-10-CM | POA: Diagnosis not present

## 2020-11-05 DIAGNOSIS — R739 Hyperglycemia, unspecified: Secondary | ICD-10-CM

## 2020-11-05 DIAGNOSIS — E559 Vitamin D deficiency, unspecified: Secondary | ICD-10-CM | POA: Diagnosis not present

## 2020-11-05 DIAGNOSIS — E538 Deficiency of other specified B group vitamins: Secondary | ICD-10-CM

## 2020-11-05 DIAGNOSIS — E039 Hypothyroidism, unspecified: Secondary | ICD-10-CM | POA: Diagnosis not present

## 2020-11-05 DIAGNOSIS — Z0001 Encounter for general adult medical examination with abnormal findings: Secondary | ICD-10-CM | POA: Diagnosis not present

## 2020-11-05 DIAGNOSIS — I10 Essential (primary) hypertension: Secondary | ICD-10-CM

## 2020-11-05 LAB — URINALYSIS, ROUTINE W REFLEX MICROSCOPIC
Bilirubin Urine: NEGATIVE
Hgb urine dipstick: NEGATIVE
Ketones, ur: NEGATIVE
Leukocytes,Ua: NEGATIVE
Nitrite: NEGATIVE
RBC / HPF: NONE SEEN (ref 0–?)
Specific Gravity, Urine: 1.025 (ref 1.000–1.030)
Total Protein, Urine: NEGATIVE
Urine Glucose: NEGATIVE
Urobilinogen, UA: 1 (ref 0.0–1.0)
pH: 6 (ref 5.0–8.0)

## 2020-11-05 LAB — HEPATIC FUNCTION PANEL
ALT: 49 U/L (ref 0–53)
AST: 53 U/L — ABNORMAL HIGH (ref 0–37)
Albumin: 4 g/dL (ref 3.5–5.2)
Alkaline Phosphatase: 88 U/L (ref 39–117)
Bilirubin, Direct: 0.2 mg/dL (ref 0.0–0.3)
Total Bilirubin: 0.8 mg/dL (ref 0.2–1.2)
Total Protein: 7.2 g/dL (ref 6.0–8.3)

## 2020-11-05 LAB — CBC WITH DIFFERENTIAL/PLATELET
Basophils Absolute: 0 10*3/uL (ref 0.0–0.1)
Basophils Relative: 0.9 % (ref 0.0–3.0)
Eosinophils Absolute: 0.3 10*3/uL (ref 0.0–0.7)
Eosinophils Relative: 8 % — ABNORMAL HIGH (ref 0.0–5.0)
HCT: 38 % — ABNORMAL LOW (ref 39.0–52.0)
Hemoglobin: 13 g/dL (ref 13.0–17.0)
Lymphocytes Relative: 31.5 % (ref 12.0–46.0)
Lymphs Abs: 1.3 10*3/uL (ref 0.7–4.0)
MCHC: 34.2 g/dL (ref 30.0–36.0)
MCV: 95.3 fl (ref 78.0–100.0)
Monocytes Absolute: 0.5 10*3/uL (ref 0.1–1.0)
Monocytes Relative: 11 % (ref 3.0–12.0)
Neutro Abs: 2.1 10*3/uL (ref 1.4–7.7)
Neutrophils Relative %: 48.6 % (ref 43.0–77.0)
Platelets: 178 10*3/uL (ref 150.0–400.0)
RBC: 3.98 Mil/uL — ABNORMAL LOW (ref 4.22–5.81)
RDW: 14.4 % (ref 11.5–15.5)
WBC: 4.3 10*3/uL (ref 4.0–10.5)

## 2020-11-05 LAB — TSH: TSH: 1.09 u[IU]/mL (ref 0.35–4.50)

## 2020-11-05 LAB — BASIC METABOLIC PANEL
BUN: 28 mg/dL — ABNORMAL HIGH (ref 6–23)
CO2: 25 mEq/L (ref 19–32)
Calcium: 9.8 mg/dL (ref 8.4–10.5)
Chloride: 105 mEq/L (ref 96–112)
Creatinine, Ser: 1.8 mg/dL — ABNORMAL HIGH (ref 0.40–1.50)
GFR: 34.23 mL/min — ABNORMAL LOW (ref >60.00)
Glucose, Bld: 98 mg/dL (ref 70–99)
Potassium: 5.1 mEq/L (ref 3.5–5.1)
Sodium: 138 mEq/L (ref 135–145)

## 2020-11-05 LAB — LIPID PANEL
Cholesterol: 165 mg/dL (ref 0–200)
HDL: 42.6 mg/dL (ref >39.00)
LDL Cholesterol: 85 mg/dL (ref 0–99)
NonHDL: 122.39
Total CHOL/HDL Ratio: 4
Triglycerides: 187 mg/dL — ABNORMAL HIGH (ref 0.0–149.0)
VLDL: 37.4 mg/dL (ref 0.0–40.0)

## 2020-11-05 LAB — VITAMIN D 25 HYDROXY (VIT D DEFICIENCY, FRACTURES): VITD: 43.47 ng/mL (ref 30.00–100.00)

## 2020-11-05 LAB — VITAMIN B12: Vitamin B-12: 259 pg/mL (ref 211–911)

## 2020-11-05 LAB — PHOSPHORUS: Phosphorus: 3.4 mg/dL (ref 2.3–4.6)

## 2020-11-05 LAB — HEMOGLOBIN A1C: Hgb A1c MFr Bld: 5.9 % (ref 4.6–6.5)
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
## 2020-11-05 NOTE — Patient Instructions (Signed)
Please continue all other medications as before, and refills have been done if requested.  Please have the pharmacy call with any other refills you may need.  Please continue your efforts at being more active, low cholesterol diet, and weight control.  You are otherwise up to date with prevention measures today.  Please keep your appointments with your specialists as you may have planned  You will be contacted regarding the referral for: Dr C for kidneys  Please go to the LAB at the blood drawing area for the tests to be done  You will be contacted by phone if any changes need to be made immediately.  Otherwise, you will receive a letter about your results with an explanation, but please check with MyChart first.  Please remember to sign up for MyChart if you have not done so, as this will be important to you in the future with finding out test results, communicating by private email, and scheduling acute appointments online when needed.  Please make an Appointment to return in 6 months, or sooner if needed

## 2020-11-06 ENCOUNTER — Encounter: Payer: Self-pay | Admitting: Internal Medicine

## 2020-11-07 ENCOUNTER — Other Ambulatory Visit: Payer: Self-pay | Admitting: Internal Medicine

## 2020-11-07 DIAGNOSIS — I480 Paroxysmal atrial fibrillation: Secondary | ICD-10-CM

## 2020-11-07 DIAGNOSIS — M1A09X0 Idiopathic chronic gout, multiple sites, without tophus (tophi): Secondary | ICD-10-CM

## 2020-11-07 LAB — PTH, INTACT AND CALCIUM
Calcium: 9.4 mg/dL (ref 8.6–10.3)
PTH: 32 pg/mL (ref 16–77)

## 2020-11-07 NOTE — Telephone Encounter (Signed)
Please refill as per office routine med refill policy (all routine meds refilled for 3 mo or monthly per pt preference up to one year from last visit, then month to month grace period for 3 mo, then further med refills will have to be denied)  

## 2020-11-09 NOTE — Assessment & Plan Note (Signed)
BP Readings from Last 3 Encounters:  11/05/20 (!) 150/98  10/02/20 120/72  09/19/20 134/70   Uncontrolled today, pt to continue medical treatment  - none except low salt diet, as state BP at home < 140/90

## 2020-11-09 NOTE — Assessment & Plan Note (Signed)
Lab Results  Component Value Date   HGBA1C 5.9 11/05/2020   Stable, pt to continue current medical treatment - diet

## 2020-11-09 NOTE — Assessment & Plan Note (Signed)
Age and sex appropriate education and counseling updated with regular exercise and diet Referrals for preventative services - none needed Immunizations addressed - dcelines covid booster, pneumovax, shingrix Smoking counseling  - none needed Evidence for depression or other mood disorder - none significant Most recent labs reviewed. I have personally reviewed and have noted: 1) the patient's medical and social history 2) The patient's current medications and supplements 3) The patient's height, weight, and BMI have been recorded in the chart

## 2020-11-09 NOTE — Assessment & Plan Note (Signed)
Lab Results  Component Value Date   LDLCALC 85 11/05/2020   Uncontrolled with goal ldl < 70, pt now off zetia pt states did not tolerate, pt to continue low chol diet, declines referral lipid clinic for now to consider repatha

## 2020-11-09 NOTE — Assessment & Plan Note (Addendum)
Lab Results  Component Value Date   CREATININE 1.80 (H) 11/05/2020   Stable to mild worsening overall, cont to avoid nephrotoxins, last seen per renal about 2 yrs ago, will re-refer Dr Arty Baumgartner

## 2020-11-09 NOTE — Assessment & Plan Note (Signed)
Lab Results  Component Value Date   TSH 1.09 11/05/2020   Stable, pt to continue levothyroxine

## 2020-11-12 ENCOUNTER — Other Ambulatory Visit: Payer: Self-pay

## 2020-11-12 MED ORDER — ISOSORBIDE MONONITRATE ER 60 MG PO TB24: 60.0000 mg | ORAL_TABLET | Freq: Every day | ORAL | 3 refills | Status: DC

## 2021-01-13 ENCOUNTER — Ambulatory Visit
Admission: EM | Admit: 2021-01-13 | Discharge: 2021-01-13 | Disposition: A | Discharge status: Home / Self Care | Payer: PPO | Attending: Physician Assistant | Admitting: Physician Assistant

## 2021-01-13 ENCOUNTER — Other Ambulatory Visit: Payer: Self-pay

## 2021-01-13 ENCOUNTER — Encounter: Payer: Self-pay | Admitting: Emergency Medicine

## 2021-01-13 DIAGNOSIS — R42 Dizziness and giddiness: Secondary | ICD-10-CM

## 2021-01-13 NOTE — Discharge Instructions (Addendum)
Call cardiology to discuss symptoms EKG reassuring today, vitals here in clinic reassuring today.  Drink plenty of fluids
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
## 2021-01-13 NOTE — ED Triage Notes (Addendum)
Has been feeling light-headed and dizzy intermittently with standing over the last three weeks. Checked his blood pressure when it happened today and was getting readings in the [id number] systolic - wife checked her blood pressure was normal to verify machine readings. Blood pressure the same in both arms. Denies chest pain and shortness of breath.  States when it happened he had been outside working in the heat, and went from sitting to standing when he got very dizzy and got those blood pressure readings. Orthostatic vital signs performed in triage. See note

## 2021-03-07 ENCOUNTER — Telehealth: Payer: Self-pay | Admitting: Internal Medicine

## 2021-03-07 DIAGNOSIS — I700 Atherosclerosis of aorta: Secondary | ICD-10-CM | POA: Diagnosis not present

## 2021-03-07 DIAGNOSIS — N183 Chronic kidney disease, stage 3 unspecified: Secondary | ICD-10-CM | POA: Diagnosis not present

## 2021-03-07 DIAGNOSIS — D509 Iron deficiency anemia, unspecified: Secondary | ICD-10-CM | POA: Diagnosis not present

## 2021-03-07 DIAGNOSIS — N400 Enlarged prostate without lower urinary tract symptoms: Secondary | ICD-10-CM | POA: Diagnosis not present

## 2021-03-07 DIAGNOSIS — I2510 Atherosclerotic heart disease of native coronary artery without angina pectoris: Secondary | ICD-10-CM | POA: Diagnosis not present

## 2021-03-07 DIAGNOSIS — I739 Peripheral vascular disease, unspecified: Secondary | ICD-10-CM | POA: Diagnosis not present

## 2021-03-07 DIAGNOSIS — N2581 Secondary hyperparathyroidism of renal origin: Secondary | ICD-10-CM | POA: Diagnosis not present

## 2021-03-07 DIAGNOSIS — N1832 Chronic kidney disease, stage 3b: Secondary | ICD-10-CM | POA: Diagnosis not present

## 2021-03-07 DIAGNOSIS — Z6827 Body mass index (BMI) 27.0-27.9, adult: Secondary | ICD-10-CM | POA: Diagnosis not present

## 2021-03-07 DIAGNOSIS — J449 Chronic obstructive pulmonary disease, unspecified: Secondary | ICD-10-CM | POA: Diagnosis not present

## 2021-03-07 NOTE — Telephone Encounter (Signed)
Left message for patient to call me back at [phone] to schedule Medicare Annual Wellness Visit   Last AWV  09/06/19  Please schedule at anytime with [facility] if patient calls the office back.    40 Minutes appointment   Any questions, please call me at [phone]

## 2021-05-05 ENCOUNTER — Ambulatory Visit
Admission: EM | Admit: 2021-05-05 | Discharge: 2021-05-05 | Disposition: A | Discharge status: Home / Self Care | Payer: PPO | Attending: Physician Assistant | Admitting: Physician Assistant

## 2021-05-05 ENCOUNTER — Other Ambulatory Visit: Payer: Self-pay

## 2021-05-05 DIAGNOSIS — J019 Acute sinusitis, unspecified: Secondary | ICD-10-CM | POA: Diagnosis not present

## 2021-05-05 MED ORDER — AMOXICILLIN-POT CLAVULANATE 875-125 MG PO TABS: 1.0000 | ORAL_TABLET | Freq: Two times a day (BID) | ORAL | 0 refills | Status: DC
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
## 2021-05-05 NOTE — ED Triage Notes (Signed)
Pt c/o sore throat, nasal congestion, cough, headache, right ear ache?,   Denies ear ache, nausea, vomiting, diarrhea, constipation, body aches or chills   Onset Tuesday

## 2021-05-06 ENCOUNTER — Other Ambulatory Visit: Payer: Self-pay | Admitting: Internal Medicine

## 2021-05-06 LAB — COVID-19, FLU A+B NAA
Influenza A, NAA: NOT DETECTED
Influenza B, NAA: NOT DETECTED
SARS-CoV-2, NAA: NOT DETECTED

## 2021-05-06 NOTE — Telephone Encounter (Signed)
Please refill as per office routine med refill policy (all routine meds to be refilled for 3 mo or monthly (per pt preference) up to one year from last visit, then month to month grace period for 3 mo, then further med refills will have to be denied) ? ?

## 2021-05-07 ENCOUNTER — Ambulatory Visit (INDEPENDENT_AMBULATORY_CARE_PROVIDER_SITE_OTHER): Payer: PPO | Admitting: Internal Medicine

## 2021-05-07 ENCOUNTER — Encounter: Payer: Self-pay | Admitting: Internal Medicine

## 2021-05-07 ENCOUNTER — Other Ambulatory Visit: Payer: Self-pay

## 2021-05-07 VITALS — BP 128/70 | HR 76 | Resp 18 | Ht 69.0 in | Wt 183.2 lb

## 2021-05-07 DIAGNOSIS — N1832 Chronic kidney disease, stage 3b: Secondary | ICD-10-CM

## 2021-05-07 DIAGNOSIS — E785 Hyperlipidemia, unspecified: Secondary | ICD-10-CM | POA: Diagnosis not present

## 2021-05-07 DIAGNOSIS — I10 Essential (primary) hypertension: Secondary | ICD-10-CM

## 2021-05-07 DIAGNOSIS — E039 Hypothyroidism, unspecified: Secondary | ICD-10-CM

## 2021-05-07 DIAGNOSIS — R739 Hyperglycemia, unspecified: Secondary | ICD-10-CM | POA: Diagnosis not present

## 2021-05-07 LAB — BASIC METABOLIC PANEL
BUN: 32 mg/dL — ABNORMAL HIGH (ref 6–23)
CO2: 28 mEq/L (ref 19–32)
Calcium: 10.1 mg/dL (ref 8.4–10.5)
Chloride: 107 mEq/L (ref 96–112)
Creatinine, Ser: 1.71 mg/dL — ABNORMAL HIGH (ref 0.40–1.50)
GFR: 36.27 mL/min — ABNORMAL LOW (ref >60.00)
Glucose, Bld: 115 mg/dL — ABNORMAL HIGH (ref 70–99)
Potassium: 5.3 mEq/L — ABNORMAL HIGH (ref 3.5–5.1)
Sodium: 140 mEq/L (ref 135–145)

## 2021-05-07 LAB — LIPID PANEL
Cholesterol: 137 mg/dL (ref 0–200)
HDL: 40.3 mg/dL (ref >39.00)
LDL Cholesterol: 64 mg/dL (ref 0–99)
NonHDL: 96.5
Total CHOL/HDL Ratio: 3
Triglycerides: 163 mg/dL — ABNORMAL HIGH (ref 0.0–149.0)
VLDL: 32.6 mg/dL (ref 0.0–40.0)

## 2021-05-07 LAB — CBC WITH DIFFERENTIAL/PLATELET
Basophils Absolute: 0 10*3/uL (ref 0.0–0.1)
Basophils Relative: 0.9 % (ref 0.0–3.0)
Eosinophils Absolute: 0.3 10*3/uL (ref 0.0–0.7)
Eosinophils Relative: 5.5 % — ABNORMAL HIGH (ref 0.0–5.0)
HCT: 37.3 % — ABNORMAL LOW (ref 39.0–52.0)
Hemoglobin: 12.4 g/dL — ABNORMAL LOW (ref 13.0–17.0)
Lymphocytes Relative: 25.7 % (ref 12.0–46.0)
Lymphs Abs: 1.4 10*3/uL (ref 0.7–4.0)
MCHC: 33.4 g/dL (ref 30.0–36.0)
MCV: 96.6 fl (ref 78.0–100.0)
Monocytes Absolute: 0.6 10*3/uL (ref 0.1–1.0)
Monocytes Relative: 10.7 % (ref 3.0–12.0)
Neutro Abs: 3.1 10*3/uL (ref 1.4–7.7)
Neutrophils Relative %: 57.2 % (ref 43.0–77.0)
Platelets: 240 10*3/uL (ref 150.0–400.0)
RBC: 3.86 Mil/uL — ABNORMAL LOW (ref 4.22–5.81)
RDW: 14.2 % (ref 11.5–15.5)
WBC: 5.4 10*3/uL (ref 4.0–10.5)

## 2021-05-07 LAB — HEPATIC FUNCTION PANEL
ALT: 24 U/L (ref 0–53)
AST: 34 U/L (ref 0–37)
Albumin: 3.9 g/dL (ref 3.5–5.2)
Alkaline Phosphatase: 99 U/L (ref 39–117)
Bilirubin, Direct: 0.1 mg/dL (ref 0.0–0.3)
Total Bilirubin: 0.6 mg/dL (ref 0.2–1.2)
Total Protein: 7.6 g/dL (ref 6.0–8.3)

## 2021-05-07 NOTE — Assessment & Plan Note (Signed)
Lab Results  Component Value Date   HGBA1C 5.9 11/05/2020   Stable, pt to continue current medical treatment  - diet

## 2021-05-07 NOTE — Assessment & Plan Note (Signed)
Lab Results  Component Value Date   LDLCALC 64 05/07/2021   Stable, pt to continue current statin  - diet

## 2021-05-07 NOTE — Patient Instructions (Signed)
Please continue all other medications as before, and refills have been done if requested.  Please have the pharmacy call with any other refills you may need.  Please continue your efforts at being more active, low cholesterol diet, and weight control  Please keep your appointments with your specialists as you may have planned  Please go to the LAB at the blood drawing area for the tests to be done  You will be contacted by phone if any changes need to be made immediately.  Otherwise, you will receive a letter about your results with an explanation, but please check with MyChart first.  Please remember to sign up for MyChart if you have not done so, as this will be important to you in the future with finding out test results, communicating by private email, and scheduling acute appointments online when needed.  Please make an Appointment to return in 6 months, or sooner if needed 

## 2021-05-07 NOTE — Assessment & Plan Note (Signed)
Lab Results  Component Value Date   TSH 1.09 11/05/2020   Stable, pt to continue levothyroxine

## 2021-05-07 NOTE — Assessment & Plan Note (Signed)
BP Readings from Last 3 Encounters:  05/07/21 128/70  05/05/21 (!) 165/75  01/13/21 126/71   Stable, pt to continue medical treatment  - diet, low salt, wt control, excercise

## 2021-05-07 NOTE — Assessment & Plan Note (Signed)
Lab Results  Component Value Date   CREATININE 1.71 (H) 05/07/2021   Stable overall, cont to avoid nephrotoxins
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
## 2021-05-14 ENCOUNTER — Encounter: Payer: Self-pay | Admitting: Internal Medicine

## 2021-05-14 DIAGNOSIS — J3489 Other specified disorders of nose and nasal sinuses: Secondary | ICD-10-CM

## 2021-07-18 ENCOUNTER — Ambulatory Visit
Admission: EM | Admit: 2021-07-18 | Discharge: 2021-07-18 | Disposition: A | Discharge status: Home / Self Care | Payer: PPO

## 2021-07-18 ENCOUNTER — Other Ambulatory Visit: Payer: Self-pay

## 2021-07-18 ENCOUNTER — Emergency Department (HOSPITAL_BASED_OUTPATIENT_CLINIC_OR_DEPARTMENT_OTHER)
Admission: EM | Admit: 2021-07-18 | Discharge: 2021-07-18 | Disposition: A | Discharge status: Home / Self Care | Payer: PPO | Attending: Emergency Medicine | Admitting: Emergency Medicine

## 2021-07-18 ENCOUNTER — Encounter (HOSPITAL_BASED_OUTPATIENT_CLINIC_OR_DEPARTMENT_OTHER): Payer: Self-pay | Admitting: Emergency Medicine

## 2021-07-18 ENCOUNTER — Emergency Department (HOSPITAL_BASED_OUTPATIENT_CLINIC_OR_DEPARTMENT_OTHER): Payer: PPO

## 2021-07-18 DIAGNOSIS — N189 Chronic kidney disease, unspecified: Secondary | ICD-10-CM | POA: Insufficient documentation

## 2021-07-18 DIAGNOSIS — I129 Hypertensive chronic kidney disease with stage 1 through stage 4 chronic kidney disease, or unspecified chronic kidney disease: Secondary | ICD-10-CM | POA: Diagnosis not present

## 2021-07-18 DIAGNOSIS — Z7982 Long term (current) use of aspirin: Secondary | ICD-10-CM | POA: Insufficient documentation

## 2021-07-18 DIAGNOSIS — K5792 Diverticulitis of intestine, part unspecified, without perforation or abscess without bleeding: Secondary | ICD-10-CM | POA: Insufficient documentation

## 2021-07-18 DIAGNOSIS — K5900 Constipation, unspecified: Secondary | ICD-10-CM | POA: Insufficient documentation

## 2021-07-18 DIAGNOSIS — R1032 Left lower quadrant pain: Secondary | ICD-10-CM

## 2021-07-18 DIAGNOSIS — R1030 Lower abdominal pain, unspecified: Secondary | ICD-10-CM

## 2021-07-18 LAB — CBC WITH DIFFERENTIAL/PLATELET
Abs Immature Granulocytes: 0.01 10*3/uL (ref 0.00–0.07)
Basophils Absolute: 0 10*3/uL (ref 0.0–0.1)
Basophils Relative: 1 %
Eosinophils Absolute: 0.2 10*3/uL (ref 0.0–0.5)
Eosinophils Relative: 5 %
HCT: 37.9 % — ABNORMAL LOW (ref 39.0–52.0)
Hemoglobin: 12.6 g/dL — ABNORMAL LOW (ref 13.0–17.0)
Immature Granulocytes: 0 %
Lymphocytes Relative: 30 %
Lymphs Abs: 1.4 10*3/uL (ref 0.7–4.0)
MCH: 31.3 pg (ref 26.0–34.0)
MCHC: 33.2 g/dL (ref 30.0–36.0)
MCV: 94.3 fL (ref 80.0–100.0)
Monocytes Absolute: 0.5 10*3/uL (ref 0.1–1.0)
Monocytes Relative: 11 %
Neutro Abs: 2.5 10*3/uL (ref 1.7–7.7)
Neutrophils Relative %: 53 %
Platelets: 187 10*3/uL (ref 150–400)
RBC: 4.02 MIL/uL — ABNORMAL LOW (ref 4.22–5.81)
RDW: 13.2 % (ref 11.5–15.5)
WBC: 4.7 10*3/uL (ref 4.0–10.5)
nRBC: 0 % (ref 0.0–0.2)

## 2021-07-18 LAB — COMPREHENSIVE METABOLIC PANEL
ALT: 32 U/L (ref 0–44)
AST: 26 U/L (ref 15–41)
Albumin: 4.2 g/dL (ref 3.5–5.0)
Alkaline Phosphatase: 84 U/L (ref 38–126)
Anion gap: 7 (ref 5–15)
BUN: 30 mg/dL — ABNORMAL HIGH (ref 8–23)
CO2: 27 mmol/L (ref 22–32)
Calcium: 9.8 mg/dL (ref 8.9–10.3)
Chloride: 105 mmol/L (ref 98–111)
Creatinine, Ser: 1.65 mg/dL — ABNORMAL HIGH (ref 0.61–1.24)
GFR, Estimated: 41 mL/min — ABNORMAL LOW (ref >60)
Glucose, Bld: 116 mg/dL — ABNORMAL HIGH (ref 70–99)
Potassium: 5 mmol/L (ref 3.5–5.1)
Sodium: 139 mmol/L (ref 135–145)
Total Bilirubin: 0.6 mg/dL (ref 0.3–1.2)
Total Protein: 7.3 g/dL (ref 6.5–8.1)

## 2021-07-18 LAB — URINALYSIS, ROUTINE W REFLEX MICROSCOPIC
Bilirubin Urine: NEGATIVE
Glucose, UA: NEGATIVE mg/dL
Hgb urine dipstick: NEGATIVE
Ketones, ur: NEGATIVE mg/dL
Leukocytes,Ua: NEGATIVE
Nitrite: NEGATIVE
Protein, ur: NEGATIVE mg/dL
Specific Gravity, Urine: 1.023 (ref 1.005–1.030)
pH: 5.5 (ref 5.0–8.0)

## 2021-07-18 LAB — LIPASE, BLOOD: Lipase: 40 U/L (ref 11–51)

## 2021-07-18 NOTE — ED Triage Notes (Signed)
Abd pain x 6 days first day had diarrhea now just lower abd pain. Hx of diverticulitis and states this feels the same.  ?

## 2021-07-18 NOTE — Discharge Instructions (Addendum)
As we discussed, your work-up was reassuring for acute abnormalities.  Your imaging did not show any signs of diverticulitis at this time.  It did show that you had some constipation I suspect is the cause of your pain.  Please manage with stool softeners and plenty of fluids at home.  Follow-up with your primary care doctor in the next few days for continued evaluation and management. ? ?Return if development of any new or worsening symptoms. ?

## 2021-07-18 NOTE — ED Provider Notes (Incomplete)
[facility] EMERGENCY DEPT Provider Note   CSN: [id number] Arrival date & time: 07/18/21  1349     History {Add pertinent medical, surgical, social history, OB history to HPI:1} Chief Complaint  Patient presents with   Abdominal Pain    Omar Benson is a 87 y.o. male.   Abdominal Pain     Home Medications Prior to Admission medications   Medication Sig Start Date End Date Taking? Authorizing Provider  allopurinol (ZYLOPRIM) 300 MG tablet TAKE 1 TABLET(300 MG) BY MOUTH DAILY 11/08/20   Biagio Borg, MD  amiodarone (PACERONE) 200 MG tablet TAKE 1/2 TABLET(100 MG) BY MOUTH DAILY 11/07/20   Hilty, Nadean Corwin, MD  amoxicillin-clavulanate (AUGMENTIN) 875-125 MG tablet Take 1 tablet by mouth every 12 (twelve) hours. 05/05/21   Francene Finders, PA-C  aspirin 81 MG tablet Take 1 tablet (81 mg total) by mouth at bedtime. 01/01/17   Delos Haring, PA-C  Cholecalciferol (VITAMIN D) 2000 UNITS CAPS Take 2,000 Units by mouth daily.     [provider]  EPINEPHrine (EPIPEN) 0.3 mg/0.3 mL SOAJ injection Inject 0.3 mLs (0.3 mg total) into the muscle once. Patient taking differently: Inject 0.3 mg into the muscle once as needed (severe allergic reaction). 02/01/13   Copland, Gay Filler, MD  ezetimibe (ZETIA) 10 MG tablet TAKE 1 TABLET(10 MG) BY MOUTH DAILY Patient not taking: Reported on 11/05/2020 08/09/20   Pixie Casino, MD  isosorbide mononitrate (IMDUR) 60 MG 24 hr tablet Take 1 tablet (60 mg total) by mouth daily. 11/12/20   Biagio Borg, MD  levothyroxine (SYNTHROID) 125 MCG tablet TAKE 1 TABLET(125 MCG) BY MOUTH DAILY 05/08/21   Biagio Borg, MD  Multiple Vitamin (MULTIVITAMIN WITH MINERALS) TABS tablet Take 1 tablet by mouth daily.     [provider]  nitroGLYCERIN (NITROSTAT) 0.4 MG SL tablet Place 1 tablet (0.4 mg total) under the tongue every 5 (five) minutes as needed for chest pain. 09/27/18   Hilty, Nadean Corwin, MD      Allergies    Fish allergy,  Promethazine hcl, Shellfish allergy, Yellow jacket venom [bee venom], Doxycycline hyclate, Lipitor [atorvastatin calcium], Tetracycline, Castor oil, Omega-3 fatty acids, Crestor [rosuvastatin], and Tetanus toxoid    Review of Systems   Review of Systems  Gastrointestinal:  Positive for abdominal pain.   Physical Exam Updated Vital Signs BP (!) 158/75    Pulse (!) 47    Temp 98.4 F (36.9 C) (Oral)    Resp 16    Ht 5\' 9"  (1.753 m)    Wt 83.1 kg    SpO2 96%    BMI 27.05 kg/m  Physical Exam  ED Results / Procedures / Treatments   Labs (all labs ordered are listed, but only abnormal results are displayed) Labs Reviewed  COMPREHENSIVE METABOLIC PANEL - Abnormal; Notable for the following components:      Result Value   Glucose, Bld 116 (*)    BUN 30 (*)    Creatinine, Ser 1.65 (*)    GFR, Estimated 41 (*)    All other components within normal limits  CBC WITH DIFFERENTIAL/PLATELET - Abnormal; Notable for the following components:   RBC 4.02 (*)    Hemoglobin 12.6 (*)    HCT 37.9 (*)    All other components within normal limits  LIPASE, BLOOD  URINALYSIS, ROUTINE W REFLEX MICROSCOPIC    EKG None  Radiology CT ABDOMEN PELVIS WO CONTRAST  Result Date: 07/18/2021 CLINICAL DATA:  Left  lower quadrant abdominal pain EXAM: CT ABDOMEN AND PELVIS WITHOUT CONTRAST TECHNIQUE: Multidetector CT imaging of the abdomen and pelvis was performed following the standard protocol without IV contrast. RADIATION DOSE REDUCTION: This exam was performed according to the departmental dose-optimization program which includes automated exposure control, adjustment of the mA and/or kV according to patient size and/or use of iterative reconstruction technique. COMPARISON:  CT abdomen and pelvis dated June 28, 2017 FINDINGS: Lower chest: Bilateral mild linear opacities which are likely due to scarring. Coronary artery calcifications. Hepatobiliary: No focal liver abnormality is seen. No gallstones, gallbladder  wall thickening, or biliary dilatation. Pancreas: Unremarkable. No pancreatic ductal dilatation or surrounding inflammatory changes. Spleen: Normal in size without focal abnormality. Adrenals/Urinary Tract: Adrenal glands are unremarkable. Kidneys are normal, without renal calculi, focal lesion, or hydronephrosis. Bladder is unremarkable. Stomach/Bowel: Stomach is within normal limits. Appendix appears normal. Diverticulosis. No evidence of bowel wall thickening, distention, or inflammatory changes. Vascular/Lymphatic: Aortic atherosclerosis. No enlarged abdominal or pelvic lymph nodes. Reproductive: Mild prostatomegaly. Other: Small bilateral fat containing inguinal hernias. Musculoskeletal: Unchanged compression deformity of the L1 vertebral body. No aggressive appearing osseous lesions. IMPRESSION: 1. No acute findings in the abdomen or pelvis. 2. Diverticulosis with no diverticulitis. 3. Coronary artery calcifications and aortic Atherosclerosis (ICD10-I70.0). Electronically Signed   By: Yetta Glassman M.D.   On: 07/18/2021 14:50    Procedures Procedures  {Document cardiac monitor, telemetry assessment procedure when appropriate:1}  Medications Ordered in ED Medications - No data to display  ED Course/ Medical Decision Making/ A&P                           Medical Decision Making Amount and/or Complexity of Data Reviewed Labs: ordered. Radiology: ordered.   ***  {Document critical care time when appropriate:1} {Document review of labs and clinical decision tools ie heart score, Chads2Vasc2 etc:1}  {Document your independent review of radiology images, and any outside records:1} {Document your discussion with family members, caretakers, and with consultants:1} {Document social determinants of health affecting pt's care:1} {Document your decision making why or why not admission, treatments were needed:1} Final Clinical Impression(s) / ED Diagnoses Final diagnoses:  None    Rx / DC  Orders ED Discharge Orders     None

## 2021-07-18 NOTE — ED Provider Notes (Signed)
Patient here today for evaluation of lower abdominal pain that he feels is similar to prior episodes of diverticulitis. He notes pain started with diarrhea about a week ago and has slowly progressed. Diarrhea only lasted one day. He denies any blood in his stool. He has had some constipation but states this is normal for him at baseline. He has not had fever. He describes his pain as a "rat gnawing" to his suprapubic, lower abdominal area. Patient has history of diverticulitis as well as other intra-abdominal co-morbidities and recommended further evaluation in the ED for imaging.  ?  ?Francene Finders, PA-C ?07/18/21 1328 ? ?

## 2021-07-18 NOTE — ED Provider Notes (Signed)
[facility]ERG[facility] DEPT Provider Note   CSN: [id number] Arrival date & time: 07/18/21  1349     History  Chief Complaint  Patient presents with   Abdominal Pain    Omar Benson is a 87 y.o. male.  Patient with history of diverticulitis, afib, and CKD presents today complaints of LLQ abdominal pain. He states that pain started 1 week ago when he had several bouts of non-bloody diarrhea for 1 day which subsequently resolved, however the abdominal pain persisted. It is located in the left lower quadrant, does not radiate anywhere else, and feels extremely similar pain he has had from diverticulitis in the past.  He went to urgent care to get antibiotics for diverticulitis and was sent here for imaging.  He denies any nausea or vomiting.  States that his last bowel movement was 2 days ago and was soft formed stool.  He states that going 2 days without a bowel movement is extremely normal for him.  He denies any fevers.  History of diverticulitis without history of complications related to this.  No previous abdominal surgeries.  The history is provided by the patient. No language interpreter was used.  Abdominal Pain Associated symptoms: diarrhea (now resolved)   Associated symptoms: no chills, no dysuria, no fever, no nausea and no vomiting       Home Medications Prior to Admission medications   Medication Sig Start Date End Date Taking? Authorizing Provider  allopurinol (ZYLOPRIM) 300 MG tablet TAKE 1 TABLET(300 MG) BY MOUTH DAILY 11/08/20   Biagio Borg, MD  amiodarone (PACERONE) 200 MG tablet TAKE 1/2 TABLET(100 MG) BY MOUTH DAILY 11/07/20   Hilty, Nadean Corwin, MD  amoxicillin-clavulanate (AUGMENTIN) 875-125 MG tablet Take 1 tablet by mouth every 12 (twelve) hours. 05/05/21   Francene Finders, PA-C  aspirin 81 MG tablet Take 1 tablet (81 mg total) by mouth at bedtime. 01/01/17   Delos Haring, PA-C  Cholecalciferol (VITAMIN D) 2000 UNITS CAPS Take 2,000 Units by mouth  daily.     [provider]  EPINEPHrine (EPIPEN) 0.3 mg/0.3 mL SOAJ injection Inject 0.3 mLs (0.3 mg total) into the muscle once. Patient taking differently: Inject 0.3 mg into the muscle once as needed (severe allergic reaction). 02/01/13   Copland, Gay Filler, MD  ezetimibe (ZETIA) 10 MG tablet TAKE 1 TABLET(10 MG) BY MOUTH DAILY Patient not taking: Reported on 11/05/2020 08/09/20   Pixie Casino, MD  isosorbide mononitrate (IMDUR) 60 MG 24 hr tablet Take 1 tablet (60 mg total) by mouth daily. 11/12/20   Biagio Borg, MD  levothyroxine (SYNTHROID) 125 MCG tablet TAKE 1 TABLET(125 MCG) BY MOUTH DAILY 05/08/21   Biagio Borg, MD  Multiple Vitamin (MULTIVITAMIN WITH MINERALS) TABS tablet Take 1 tablet by mouth daily.     [provider]  nitroGLYCERIN (NITROSTAT) 0.4 MG SL tablet Place 1 tablet (0.4 mg total) under the tongue every 5 (five) minutes as needed for chest pain. 09/27/18   Hilty, Nadean Corwin, MD      Allergies    Fish allergy, Promethazine hcl, Shellfish allergy, Yellow jacket venom [bee venom], Doxycycline hyclate, Lipitor [atorvastatin calcium], Tetracycline, Castor oil, Omega-3 fatty acids, Crestor [rosuvastatin], and Tetanus toxoid    Review of Systems   Review of Systems  Constitutional:  Negative for chills and fever.  Gastrointestinal:  Positive for abdominal pain and diarrhea (now resolved). Negative for nausea and vomiting.  Genitourinary:  Negative for dysuria.  All other systems reviewed and  are negative.  Physical Exam Updated Vital Signs BP (!) 158/75    Pulse (!) 47    Temp 98.4 F (36.9 C) (Oral)    Resp 16    Ht 5\' 9"  (1.753 m)    Wt 83.1 kg    SpO2 96%    BMI 27.05 kg/m  Physical Exam Vitals and nursing note reviewed.  Constitutional:      General: He is not in acute distress.    Appearance: He is well-developed and normal weight. He is not ill-appearing, toxic-appearing or diaphoretic.     Comments: Patient resting comfortably in bed in no  acute distress  HENT:     Head: Normocephalic and atraumatic.  Cardiovascular:     Rate and Rhythm: Regular rhythm. Bradycardia present.     Heart sounds: Normal heart sounds.     Comments: Bradycardia is baseline for him Pulmonary:     Effort: Pulmonary effort is normal. No respiratory distress.     Breath sounds: Normal breath sounds.  Abdominal:     General: Abdomen is flat.     Palpations: Abdomen is soft.     Tenderness: There is abdominal tenderness in the left lower quadrant. There is no right CVA tenderness, left CVA tenderness, guarding or rebound. Negative signs include Murphy's sign, Rovsing's sign, McBurney's sign, psoas sign and obturator sign.  Skin:    General: Skin is warm and dry.     Capillary Refill: Capillary refill takes less than 2 seconds.  Neurological:     General: No focal deficit present.     Mental Status: He is alert.  Psychiatric:        Mood and Affect: Mood normal.        Behavior: Behavior normal.    ED Results / Procedures / Treatments   Labs (all labs ordered are listed, but only abnormal results are displayed) Labs Reviewed  COMPREHENSIVE METABOLIC PANEL - Abnormal; Notable for the following components:      Result Value   Glucose, Bld 116 (*)    BUN 30 (*)    Creatinine, Ser 1.65 (*)    GFR, Estimated 41 (*)    All other components within normal limits  CBC WITH DIFFERENTIAL/PLATELET - Abnormal; Notable for the following components:   RBC 4.02 (*)    Hemoglobin 12.6 (*)    HCT 37.9 (*)    All other components within normal limits  LIPASE, BLOOD  URINALYSIS, ROUTINE W REFLEX MICROSCOPIC    EKG None  Radiology CT ABDOMEN PELVIS WO CONTRAST  Result Date: 07/18/2021 CLINICAL DATA:  Left lower quadrant abdominal pain EXAM: CT ABDOMEN AND PELVIS WITHOUT CONTRAST TECHNIQUE: Multidetector CT imaging of the abdomen and pelvis was performed following the standard protocol without IV contrast. RADIATION DOSE REDUCTION: This exam was  performed according to the departmental dose-optimization program which includes automated exposure control, adjustment of the mA and/or kV according to patient size and/or use of iterative reconstruction technique. COMPARISON:  CT abdomen and pelvis dated June 28, 2017 FINDINGS: Lower chest: Bilateral mild linear opacities which are likely due to scarring. Coronary artery calcifications. Hepatobiliary: No focal liver abnormality is seen. No gallstones, gallbladder wall thickening, or biliary dilatation. Pancreas: Unremarkable. No pancreatic ductal dilatation or surrounding inflammatory changes. Spleen: Normal in size without focal abnormality. Adrenals/Urinary Tract: Adrenal glands are unremarkable. Kidneys are normal, without renal calculi, focal lesion, or hydronephrosis. Bladder is unremarkable. Stomach/Bowel: Stomach is within normal limits. Appendix appears normal. Diverticulosis. No evidence of bowel wall  thickening, distention, or inflammatory changes. Vascular/Lymphatic: Aortic atherosclerosis. No enlarged abdominal or pelvic lymph nodes. Reproductive: Mild prostatomegaly. Other: Small bilateral fat containing inguinal hernias. Musculoskeletal: Unchanged compression deformity of the L1 vertebral body. No aggressive appearing osseous lesions. IMPRESSION: 1. No acute findings in the abdomen or pelvis. 2. Diverticulosis with no diverticulitis. 3. Coronary artery calcifications and aortic Atherosclerosis (ICD10-I70.0). Electronically Signed   By: Yetta Glassman M.D.   On: 07/18/2021 14:50    Procedures Procedures    Medications Ordered in ED Medications - No data to display  ED Course/ Medical Decision Making/ A&P                           Medical Decision Making Amount and/or Complexity of Data Reviewed Labs: ordered. Radiology: ordered.   This patient presents to the ED for concern of abdominal pain, this involves an extensive number of treatment options, and is a complaint that  carries with it a high risk of complications and morbidity.    Co morbidities that complicate the patient evaluation  Htn, diverticulitis,CKD   Additional history obtained:  Additional history obtained from prior notes   Lab Tests:  I Ordered, and personally interpreted labs.  The pertinent results include: No acute pertinent laboratory findings.  No leukocytosis.   Imaging Studies ordered:  I ordered imaging studies including CT abdomen pelvis with contrast  I independently visualized and interpreted imaging which showed  1. No acute findings in the abdomen or pelvis. 2. Diverticulosis with no diverticulitis. 3. Coronary artery calcifications and aortic Atherosclerosis I agree with the radiologist interpretation  Patient is nontoxic, nonseptic appearing, in no apparent distress.  Patient's pain and other symptoms adequately managed in emergency department.  Labs, imaging and vitals reviewed.  Patient does not meet the SIRS or Sepsis criteria.  On repeat exam patient does not have a surgical abdomin and there are no peritoneal signs.  No indication of appendicitis, bowel obstruction, bowel perforation, cholecystitis, or diverticulitis. Bowels did show moderate stool burden without signs of obstruction, suspect patients symptoms related to constipation. Will recommend stool softeners and plenty of hydration for management of same.  He is stable for discharge at this time with strict instructions for follow-up with their primary care physician.  I have also discussed reasons to return immediately to the ER.  Patient expresses understanding and agrees with plan.  Discharged in stable condition.   Final Clinical Impression(s) / ED Diagnoses Final diagnoses:  Left lower quadrant abdominal pain  Constipation, unspecified constipation type    Rx / DC Orders ED Discharge Orders     None     An After Visit Summary was printed and given to the patient.     Nestor Lewandowsky 07/18/21 2145    Hayden Rasmussen, MD 07/19/21 [phone]

## 2021-07-18 NOTE — ED Triage Notes (Signed)
Pt c/o abd pain states "it feels like there's a rat in there knowing away." States last weekend had diarrhea that resolved with imodium. Denies current diarrhea or bloody stool.  ?

## 2021-07-18 NOTE — ED Notes (Signed)
Pt discharged home after verbalizing understanding of discharge instructions; nad noted. 

## 2021-07-25 ENCOUNTER — Ambulatory Visit (INDEPENDENT_AMBULATORY_CARE_PROVIDER_SITE_OTHER): Payer: PPO | Admitting: Internal Medicine

## 2021-07-25 ENCOUNTER — Encounter: Payer: Self-pay | Admitting: Internal Medicine

## 2021-07-25 ENCOUNTER — Other Ambulatory Visit: Payer: Self-pay

## 2021-07-25 VITALS — BP 122/70 | HR 53 | Temp 98.0°F | Ht 69.0 in | Wt 181.8 lb

## 2021-07-25 DIAGNOSIS — Z0000 Encounter for general adult medical examination without abnormal findings: Secondary | ICD-10-CM

## 2021-07-25 NOTE — Patient Instructions (Signed)
Please continue all other medications as before, and refills have been done if requested.  Please have the pharmacy call with any other refills you may need.  Please continue your efforts at being more active, low cholesterol diet, and weight control.  Please keep your appointments with your specialists as you may have planned     
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
## 2021-07-26 ENCOUNTER — Encounter: Payer: Self-pay | Admitting: Internal Medicine

## 2021-07-26 NOTE — Assessment & Plan Note (Signed)
Age and sex appropriate education and counseling updated with regular exercise and diet ?Referrals for preventative services - none needed ?Immunizations addressed - declines shingrix, covid booster, flu shot, pneumovax ?Smoking counseling  - none needed ?Evidence for depression or other mood disorder - none significant ?Most recent labs reviewed. ?I have personally reviewed and have noted: ?1) the patient's medical and social history ?2) The patient's current medications and supplements ?3) The patient's height, weight, and BMI have been recorded in the chart ? ?

## 2021-11-02 ENCOUNTER — Other Ambulatory Visit: Payer: Self-pay | Admitting: Internal Medicine

## 2021-11-02 DIAGNOSIS — I480 Paroxysmal atrial fibrillation: Secondary | ICD-10-CM

## 2021-11-05 ENCOUNTER — Other Ambulatory Visit: Payer: Self-pay

## 2021-11-05 ENCOUNTER — Encounter: Payer: Self-pay | Admitting: Internal Medicine

## 2021-11-05 MED ORDER — NITROGLYCERIN 0.4 MG SL SUBL: 0.4000 mg | SUBLINGUAL_TABLET | SUBLINGUAL | 3 refills | Status: DC | PRN

## 2021-11-11 ENCOUNTER — Encounter: Payer: Self-pay | Admitting: Internal Medicine

## 2021-11-11 ENCOUNTER — Ambulatory Visit (INDEPENDENT_AMBULATORY_CARE_PROVIDER_SITE_OTHER): Payer: PPO | Admitting: Internal Medicine

## 2021-11-11 VITALS — BP 136/72 | HR 49 | Temp 98.1°F | Ht 69.0 in | Wt 182.0 lb

## 2021-11-11 DIAGNOSIS — R739 Hyperglycemia, unspecified: Secondary | ICD-10-CM | POA: Diagnosis not present

## 2021-11-11 DIAGNOSIS — E785 Hyperlipidemia, unspecified: Secondary | ICD-10-CM | POA: Diagnosis not present

## 2021-11-11 DIAGNOSIS — E538 Deficiency of other specified B group vitamins: Secondary | ICD-10-CM | POA: Diagnosis not present

## 2021-11-11 DIAGNOSIS — N1832 Chronic kidney disease, stage 3b: Secondary | ICD-10-CM

## 2021-11-11 DIAGNOSIS — I10 Essential (primary) hypertension: Secondary | ICD-10-CM

## 2021-11-11 DIAGNOSIS — E559 Vitamin D deficiency, unspecified: Secondary | ICD-10-CM | POA: Diagnosis not present

## 2021-11-11 DIAGNOSIS — H9319 Tinnitus, unspecified ear: Secondary | ICD-10-CM | POA: Diagnosis not present

## 2021-11-11 LAB — BASIC METABOLIC PANEL
BUN: 27 mg/dL — ABNORMAL HIGH (ref 6–23)
CO2: 26 mEq/L (ref 19–32)
Calcium: 10.3 mg/dL (ref 8.4–10.5)
Chloride: 104 mEq/L (ref 96–112)
Creatinine, Ser: 1.77 mg/dL — ABNORMAL HIGH (ref 0.40–1.50)
GFR: 34.67 mL/min — ABNORMAL LOW (ref >60.00)
Glucose, Bld: 91 mg/dL (ref 70–99)
Potassium: 5.2 mEq/L — ABNORMAL HIGH (ref 3.5–5.1)
Sodium: 138 mEq/L (ref 135–145)

## 2021-11-11 LAB — LIPID PANEL
Cholesterol: 172 mg/dL (ref 0–200)
HDL: 42 mg/dL (ref >39.00)
NonHDL: 130.03
Total CHOL/HDL Ratio: 4
Triglycerides: 246 mg/dL — ABNORMAL HIGH (ref 0.0–149.0)
VLDL: 49.2 mg/dL — ABNORMAL HIGH (ref 0.0–40.0)

## 2021-11-11 LAB — CBC WITH DIFFERENTIAL/PLATELET
Basophils Absolute: 0.1 10*3/uL (ref 0.0–0.1)
Basophils Relative: 1 % (ref 0.0–3.0)
Eosinophils Absolute: 0.3 10*3/uL (ref 0.0–0.7)
Eosinophils Relative: 6.3 % — ABNORMAL HIGH (ref 0.0–5.0)
HCT: 40.3 % (ref 39.0–52.0)
Hemoglobin: 13.1 g/dL (ref 13.0–17.0)
Lymphocytes Relative: 29.5 % (ref 12.0–46.0)
Lymphs Abs: 1.6 10*3/uL (ref 0.7–4.0)
MCHC: 32.6 g/dL (ref 30.0–36.0)
MCV: 96.3 fl (ref 78.0–100.0)
Monocytes Absolute: 0.6 10*3/uL (ref 0.1–1.0)
Monocytes Relative: 10.9 % (ref 3.0–12.0)
Neutro Abs: 2.7 10*3/uL (ref 1.4–7.7)
Neutrophils Relative %: 52.3 % (ref 43.0–77.0)
Platelets: 203 10*3/uL (ref 150.0–400.0)
RBC: 4.18 Mil/uL — ABNORMAL LOW (ref 4.22–5.81)
RDW: 14.8 % (ref 11.5–15.5)
WBC: 5.2 10*3/uL (ref 4.0–10.5)

## 2021-11-11 LAB — HEPATIC FUNCTION PANEL
ALT: 47 U/L (ref 0–53)
AST: 45 U/L — ABNORMAL HIGH (ref 0–37)
Albumin: 4.3 g/dL (ref 3.5–5.2)
Alkaline Phosphatase: 91 U/L (ref 39–117)
Bilirubin, Direct: 0.2 mg/dL (ref 0.0–0.3)
Total Bilirubin: 0.8 mg/dL (ref 0.2–1.2)
Total Protein: 7.3 g/dL (ref 6.0–8.3)

## 2021-11-11 LAB — HEMOGLOBIN A1C: Hgb A1c MFr Bld: 5.7 % (ref 4.6–6.5)

## 2021-11-11 LAB — VITAMIN D 25 HYDROXY (VIT D DEFICIENCY, FRACTURES): VITD: 43.79 ng/mL (ref 30.00–100.00)

## 2021-11-11 LAB — VITAMIN B12: Vitamin B-12: 332 pg/mL (ref 211–911)

## 2021-11-11 LAB — TSH: TSH: 0.9 u[IU]/mL (ref 0.35–5.50)

## 2021-11-11 LAB — LDL CHOLESTEROL, DIRECT: Direct LDL: 92 mg/dL
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
## 2021-11-12 LAB — URINALYSIS, ROUTINE W REFLEX MICROSCOPIC
Bilirubin Urine: NEGATIVE
Hgb urine dipstick: NEGATIVE
Ketones, ur: NEGATIVE
Leukocytes,Ua: NEGATIVE
Nitrite: NEGATIVE
RBC / HPF: NONE SEEN (ref 0–?)
Specific Gravity, Urine: 1.025 (ref 1.000–1.030)
Total Protein, Urine: NEGATIVE
Urine Glucose: NEGATIVE
Urobilinogen, UA: 0.2 (ref 0.0–1.0)
pH: 5.5 (ref 5.0–8.0)

## 2021-11-14 ENCOUNTER — Encounter: Payer: Self-pay | Admitting: Internal Medicine

## 2021-11-14 DIAGNOSIS — H9319 Tinnitus, unspecified ear: Secondary | ICD-10-CM | POA: Insufficient documentation

## 2021-11-14 NOTE — Assessment & Plan Note (Signed)
BP Readings from Last 3 Encounters:  11/11/21 136/72  07/25/21 122/70  07/18/21 (!) 156/101   Stable, pt to continue medical treatment  - diet, wt control, excercise

## 2021-11-14 NOTE — Assessment & Plan Note (Signed)
Lab Results  Component Value Date   CREATININE 1.77 (H) 11/11/2021   Stable overall, cont to avoid nephrotoxins

## 2021-11-14 NOTE — Assessment & Plan Note (Signed)
Subjective worsening recent, etiology unclear, for ENT referral

## 2021-11-14 NOTE — Assessment & Plan Note (Signed)
Lab Results  Component Value Date   LDLCALC 64 05/07/2021   Stable, pt to continue current low chol diet

## 2021-11-14 NOTE — Assessment & Plan Note (Signed)
Lab Results  Component Value Date   HGBA1C 5.7 11/11/2021   Stable, pt to continue current medical treatment - diet, wt control, excercise

## 2021-11-20 ENCOUNTER — Other Ambulatory Visit: Payer: Self-pay

## 2021-11-20 ENCOUNTER — Telehealth: Payer: Self-pay

## 2021-11-20 DIAGNOSIS — M1A09X0 Idiopathic chronic gout, multiple sites, without tophus (tophi): Secondary | ICD-10-CM

## 2021-11-20 MED ORDER — ALLOPURINOL 300 MG PO TABS: ORAL_TABLET | ORAL | 3 refills | Status: DC

## 2021-11-20 MED ORDER — LEVOTHYROXINE SODIUM 125 MCG PO TABS: ORAL_TABLET | ORAL | 1 refills | Status: DC

## 2021-11-20 MED ORDER — ISOSORBIDE MONONITRATE ER 60 MG PO TB24
60.0000 mg | ORAL_TABLET | Freq: Every day | ORAL | 3 refills | Status: DC
Start: 2021-11-20 — End: 2022-11-23

## 2021-11-20 NOTE — Telephone Encounter (Signed)
Made pt aware of sending in medication for pick up at his pharmacy on [place] on [address]

## 2021-11-20 NOTE — Telephone Encounter (Signed)
Pt is requesting refill on: levothyroxine (SYNTHROID) 125 MCG tablet allopurinol (ZYLOPRIM) 300 MG tablet isosorbide mononitrate (IMDUR) 60 MG 24 hr tablet  Pharmacy: [facility] [place], [address] AT [place] 11/11/21 ROV 05/14/22

## 2021-12-12 ENCOUNTER — Encounter: Payer: Self-pay | Admitting: Internal Medicine

## 2021-12-12 ENCOUNTER — Telehealth: Payer: Self-pay | Admitting: Urgent Care

## 2021-12-12 DIAGNOSIS — U071 COVID-19: Secondary | ICD-10-CM

## 2021-12-13 ENCOUNTER — Telehealth: Payer: Self-pay | Admitting: Urgent Care

## 2021-12-13 MED ORDER — MOLNUPIRAVIR EUA 200MG CAPSULE: 4.0000 | ORAL_CAPSULE | Freq: Two times a day (BID) | ORAL | 0 refills | Status: AC

## 2021-12-13 NOTE — Telephone Encounter (Signed)
Provider called patient on telephone at 11:00am to further discuss positive covid and treatment. Pt stated his only current symptom was a temp of 99 tmax and a scratchy throat. Denies CP or SOB. His wife also recently had covid and just completed the molnupiravir. He understands this is not FDA approved. Also understands use of the mediation. Pt given opportunity to ask questions and ER precautions reviewed in depth. All questions/ concerns answered.

## 2021-12-13 NOTE — Progress Notes (Signed)
E-Visit  for Positive Covid Test Result  We are sorry you are not feeling well. We are here to help!  You have tested positive for COVID-19, meaning that you were infected with the novel coronavirus and could give the virus to others.  It is vitally important that you stay home so you do not spread it to others.      Please continue isolation at home, for at least 10 days since the start of your symptoms and until you have had 24 hours with no fever (without taking a fever reducer) and with improving of symptoms.  If you have no symptoms but tested positive (or all symptoms resolve after 5 days and you have no fever) you can leave your house but continue to wear a mask around others for an additional 5 days. If you have a fever,continue to stay home until you have had 24 hours of no fever. Most cases improve 5-10 days from onset but we have seen a small number of patients who have gotten worse after the 10 days.  Please be sure to watch for worsening symptoms and remain taking the proper precautions.   Go to the nearest hospital ED for assessment if fever/cough/breathlessness are severe or illness seems like a threat to life.    The following symptoms may appear 2-14 days after exposure: Fever Cough Shortness of breath or difficulty breathing Chills Repeated shaking with chills Muscle pain Headache Sore throat New loss of taste or smell Fatigue Congestion or runny nose Nausea or vomiting Diarrhea  You have been enrolled in [facility] for COVID-19. Daily you will receive a questionnaire within the [facility] website. Our COVID-19 response team will be monitoring your responses daily.  You may take acetaminophen (Tylenol) as needed for fever.  I have called in [place] for you, a NON-FDA APPROVED, EMERGENCY USE AUTHORIZATION INVESTIGATIONAL new drug. This is used for patients at high risk to prevent serious complications or hospitalization. This does NOT cure covid.  Please read the entire handout from the pharmacy prior to taking.  HOME CARE: Only take medications as instructed by your medical team. Drink plenty of fluids and get plenty of rest. A steam or ultrasonic humidifier can help if you have congestion.   GET HELP RIGHT AWAY IF YOU HAVE EMERGENCY WARNING SIGNS.  Call 911 or proceed to your closest emergency facility if: You develop worsening high fever. Trouble breathing Bluish lips or face Persistent pain or pressure in the chest New confusion Inability to wake or stay awake You cough up blood. Your symptoms become more severe Inability to hold down food or fluids  This list is not all possible symptoms. Contact your medical provider for any symptoms that are severe or concerning to you.    Your e-visit answers were reviewed by a board certified advanced clinical practitioner to complete your personal care plan.  Depending on the condition, your plan could have included both over the counter or prescription medications.  If there is a problem please reply once you have received a response from your provider.  Your safety is important to [place].  If you have drug allergies check your prescription carefully.    You can use MyChart to ask questions about today's visit, request a non-urgent call back, or ask for a work or school excuse for 24 hours related to this e-Visit. If it has been greater than 24 hours you will need to follow up with your provider, or enter a new  e-Visit to address those concerns. You will get an e-mail in the next two days asking about your experience.  I hope that your e-visit has been valuable and will speed your recovery. Thank you for using e-visits.     I have spent 5 minutes in review of e-visit questionnaire, review and updating patient chart, medical decision making and response to patient.   [place], PA

## 2021-12-15 NOTE — Telephone Encounter (Signed)
Already addressed at [facility] E visit

## 2021-12-21 ENCOUNTER — Encounter: Payer: Self-pay | Admitting: Emergency Medicine

## 2021-12-21 ENCOUNTER — Ambulatory Visit
Admission: EM | Admit: 2021-12-21 | Discharge: 2021-12-21 | Disposition: A | Discharge status: Home / Self Care | Payer: PPO | Attending: Physician Assistant | Admitting: Physician Assistant

## 2021-12-21 ENCOUNTER — Other Ambulatory Visit: Payer: Self-pay

## 2021-12-21 DIAGNOSIS — B029 Zoster without complications: Secondary | ICD-10-CM | POA: Diagnosis not present

## 2021-12-21 MED ORDER — VALACYCLOVIR HCL 1 G PO TABS: 1000.0000 mg | ORAL_TABLET | Freq: Three times a day (TID) | ORAL | 0 refills | Status: DC
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
## 2021-12-21 NOTE — ED Triage Notes (Signed)
Patient presents to [facility] after being diagnosed with COVID a week ago.  C/o sores to his right gums near teeth and cheek.  Hx of canker sores, states this does not feel the same and are usually on his lips.

## 2022-05-01 ENCOUNTER — Other Ambulatory Visit: Payer: Self-pay | Admitting: Internal Medicine

## 2022-05-01 NOTE — Telephone Encounter (Signed)
Please refill as per office routine med refill policy (all routine meds to be refilled for 3 mo or monthly (per pt preference) up to one year from last visit, then month to month grace period for 3 mo, then further med refills will have to be denied) ? ?

## 2022-05-07 ENCOUNTER — Telehealth: Payer: Self-pay | Admitting: Internal Medicine

## 2022-05-07 NOTE — Telephone Encounter (Signed)
Left message for patient to call back to schedule Medicare Annual Wellness Visit   Last AWV  09/06/19  Please schedule at anytime with [facility] if patient calls the office back.     Any questions, please call me at [phone]

## 2022-05-14 ENCOUNTER — Ambulatory Visit (INDEPENDENT_AMBULATORY_CARE_PROVIDER_SITE_OTHER): Payer: PPO | Admitting: Internal Medicine

## 2022-05-14 ENCOUNTER — Encounter: Payer: Self-pay | Admitting: Internal Medicine

## 2022-05-14 VITALS — BP 120/64 | HR 63 | Temp 98.4°F | Ht 69.0 in | Wt 183.0 lb

## 2022-05-14 DIAGNOSIS — J449 Chronic obstructive pulmonary disease, unspecified: Secondary | ICD-10-CM | POA: Diagnosis not present

## 2022-05-14 DIAGNOSIS — R1013 Epigastric pain: Secondary | ICD-10-CM | POA: Diagnosis not present

## 2022-05-14 DIAGNOSIS — I10 Essential (primary) hypertension: Secondary | ICD-10-CM | POA: Diagnosis not present

## 2022-05-14 DIAGNOSIS — D509 Iron deficiency anemia, unspecified: Secondary | ICD-10-CM

## 2022-05-14 LAB — CBC WITH DIFFERENTIAL/PLATELET
Basophils Absolute: 0 10*3/uL (ref 0.0–0.1)
Basophils Relative: 0.7 % (ref 0.0–3.0)
Eosinophils Absolute: 0.3 10*3/uL (ref 0.0–0.7)
Eosinophils Relative: 5.4 % — ABNORMAL HIGH (ref 0.0–5.0)
HCT: 38.7 % — ABNORMAL LOW (ref 39.0–52.0)
Hemoglobin: 12.8 g/dL — ABNORMAL LOW (ref 13.0–17.0)
Lymphocytes Relative: 30 % (ref 12.0–46.0)
Lymphs Abs: 1.7 10*3/uL (ref 0.7–4.0)
MCHC: 33 g/dL (ref 30.0–36.0)
MCV: 95.9 fl (ref 78.0–100.0)
Monocytes Absolute: 0.6 10*3/uL (ref 0.1–1.0)
Monocytes Relative: 11.3 % (ref 3.0–12.0)
Neutro Abs: 2.9 10*3/uL (ref 1.4–7.7)
Neutrophils Relative %: 52.6 % (ref 43.0–77.0)
Platelets: 193 10*3/uL (ref 150.0–400.0)
RBC: 4.03 Mil/uL — ABNORMAL LOW (ref 4.22–5.81)
RDW: 14.4 % (ref 11.5–15.5)
WBC: 5.6 10*3/uL (ref 4.0–10.5)

## 2022-05-14 LAB — BASIC METABOLIC PANEL
BUN: 31 mg/dL — ABNORMAL HIGH (ref 6–23)
CO2: 28 mEq/L (ref 19–32)
Calcium: 9.8 mg/dL (ref 8.4–10.5)
Chloride: 105 mEq/L (ref 96–112)
Creatinine, Ser: 1.94 mg/dL — ABNORMAL HIGH (ref 0.40–1.50)
GFR: 30.95 mL/min — ABNORMAL LOW (ref >60.00)
Glucose, Bld: 108 mg/dL — ABNORMAL HIGH (ref 70–99)
Potassium: 5.1 mEq/L (ref 3.5–5.1)
Sodium: 139 mEq/L (ref 135–145)

## 2022-05-14 LAB — HEPATIC FUNCTION PANEL
ALT: 44 U/L (ref 0–53)
AST: 45 U/L — ABNORMAL HIGH (ref 0–37)
Albumin: 4.2 g/dL (ref 3.5–5.2)
Alkaline Phosphatase: 92 U/L (ref 39–117)
Bilirubin, Direct: 0.1 mg/dL (ref 0.0–0.3)
Total Bilirubin: 0.4 mg/dL (ref 0.2–1.2)
Total Protein: 7.5 g/dL (ref 6.0–8.3)

## 2022-05-14 LAB — IBC PANEL
Iron: 56 ug/dL (ref 42–165)
Saturation Ratios: 16.6 % — ABNORMAL LOW (ref 20.0–50.0)
TIBC: 337.4 ug/dL (ref 250.0–450.0)
Transferrin: 241 mg/dL (ref 212.0–360.0)

## 2022-05-14 MED ORDER — PANTOPRAZOLE SODIUM 40 MG PO TBEC: 40.0000 mg | DELAYED_RELEASE_TABLET | Freq: Every day | ORAL | 3 refills | Status: DC

## 2022-05-14 MED ORDER — SUCRALFATE 1 G PO TABS: 1.0000 g | ORAL_TABLET | Freq: Three times a day (TID) | ORAL | 0 refills | Status: DC
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
## 2022-05-14 NOTE — Patient Instructions (Addendum)
Please take all new medication as prescribed - the protonix 40 mg per day, as well as the Carafate as prescribed  Please continue all other medications as before, and refills have been done if requested.  Please have the pharmacy call with any other refills you may need.  Please continue your efforts at being more active, low cholesterol diet, and weight control.  Please keep your appointments with your specialists as you may have planned  You will be contacted regarding the referral for: GI  Please go to the LAB at the blood drawing area for the tests to be done  You will be contacted by phone if any changes need to be made immediately.  Otherwise, you will receive a letter about your results with an explanation, but please check with MyChart first.  Please make an Appointment to return in 3 months, or sooner if needed

## 2022-05-14 NOTE — Assessment & Plan Note (Signed)
No overt bleeding but for iron level wiwth labs

## 2022-05-14 NOTE — Assessment & Plan Note (Signed)
BP Readings from Last 3 Encounters:  05/14/22 120/64  12/21/21 (!) 183/82  11/11/21 136/72   Stable, pt to continue medical treatment  - diet wt control, low salt

## 2022-05-14 NOTE — Assessment & Plan Note (Signed)
Stable overall, cont inhaler prn 

## 2022-05-14 NOTE — Assessment & Plan Note (Signed)
Etiology unclear but most c/w gastritis, can't r/o h pylori or other, for labs , restart protonix 40 mg qd, add carafate qid x 1 mo, refer GI for possible EGD

## 2022-05-15 LAB — H. PYLORI ANTIBODY, IGG: H Pylori IgG: NEGATIVE

## 2022-05-15 LAB — URINALYSIS, ROUTINE W REFLEX MICROSCOPIC
Bilirubin Urine: NEGATIVE
Hgb urine dipstick: NEGATIVE
Leukocytes,Ua: NEGATIVE
Nitrite: NEGATIVE
RBC / HPF: NONE SEEN (ref 0–?)
Specific Gravity, Urine: 1.025 (ref 1.000–1.030)
Total Protein, Urine: NEGATIVE
Urine Glucose: NEGATIVE
Urobilinogen, UA: 0.2 (ref 0.0–1.0)
pH: 6 (ref 5.0–8.0)

## 2022-05-15 LAB — FERRITIN: Ferritin: 107.3 ng/mL (ref 22.0–322.0)

## 2022-05-20 ENCOUNTER — Other Ambulatory Visit: Payer: Self-pay | Admitting: Radiology

## 2022-05-20 MED ORDER — SUCRALFATE 1 G PO TABS: 1.0000 g | ORAL_TABLET | Freq: Three times a day (TID) | ORAL | 0 refills | Status: DC

## 2022-06-29 ENCOUNTER — Ambulatory Visit
Admission: RE | Admit: 2022-06-29 | Discharge: 2022-06-29 | Disposition: A | Discharge status: Home / Self Care | Payer: PPO | Source: Ambulatory Visit | Attending: Family Medicine | Admitting: Family Medicine

## 2022-06-29 VITALS — BP 137/74 | HR 55 | Temp 98.0°F | Resp 16

## 2022-06-29 DIAGNOSIS — K5732 Diverticulitis of large intestine without perforation or abscess without bleeding: Secondary | ICD-10-CM

## 2022-06-29 MED ORDER — AMOXICILLIN-POT CLAVULANATE 875-125 MG PO TABS: 1.0000 | ORAL_TABLET | Freq: Two times a day (BID) | ORAL | 0 refills | Status: AC
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
## 2022-06-29 NOTE — Discharge Instructions (Signed)
You were seen today for abdominal pain, likely diverticulitis.  I have sent out augmentin to take twice/day x 10 days.  If you have worsening pain, nausea, vomiting, fever/chills, or develop bloody stool please go to the ER for further evaluation.

## 2022-06-29 NOTE — ED Triage Notes (Signed)
Pt c/o diverticulitis says "it feels like a rat in there" says he has had this before onset ~ sat

## 2022-08-13 ENCOUNTER — Ambulatory Visit: Payer: PPO | Admitting: Internal Medicine

## 2022-11-04 ENCOUNTER — Telehealth: Payer: Self-pay | Admitting: Internal Medicine

## 2022-11-04 ENCOUNTER — Other Ambulatory Visit: Payer: Self-pay | Admitting: Internal Medicine

## 2022-11-04 DIAGNOSIS — I480 Paroxysmal atrial fibrillation: Secondary | ICD-10-CM

## 2022-11-04 MED ORDER — AMIODARONE HCL 200 MG PO TABS: ORAL_TABLET | ORAL | 1 refills | Status: DC

## 2022-11-04 NOTE — Telephone Encounter (Signed)
Pt's medication was sent to pt's pharmacy as requested. Confirmation received.  °

## 2022-11-04 NOTE — Telephone Encounter (Signed)
*  STAT* If patient is at the pharmacy, call can be transferred to refill team.   1. Which medications need to be refilled? (please list name of each medication and dose if known)   amiodarone (PACERONE) 200 MG tablet    2. Which pharmacy/location (including street and city if local pharmacy) is medication to be sent to? [id number] - [place], [place] - [address]   3. Do they need a 30 day or 90 day supply? 90

## 2022-11-13 ENCOUNTER — Ambulatory Visit: Payer: PPO | Admitting: Internal Medicine

## 2022-11-23 ENCOUNTER — Encounter: Payer: Self-pay | Admitting: Internal Medicine

## 2022-11-23 ENCOUNTER — Ambulatory Visit (INDEPENDENT_AMBULATORY_CARE_PROVIDER_SITE_OTHER): Payer: PPO | Admitting: Internal Medicine

## 2022-11-23 VITALS — BP 128/76 | HR 50 | Temp 98.2°F | Ht 69.0 in | Wt 178.0 lb

## 2022-11-23 DIAGNOSIS — E039 Hypothyroidism, unspecified: Secondary | ICD-10-CM | POA: Diagnosis not present

## 2022-11-23 DIAGNOSIS — R739 Hyperglycemia, unspecified: Secondary | ICD-10-CM

## 2022-11-23 DIAGNOSIS — I480 Paroxysmal atrial fibrillation: Secondary | ICD-10-CM

## 2022-11-23 DIAGNOSIS — D509 Iron deficiency anemia, unspecified: Secondary | ICD-10-CM | POA: Diagnosis not present

## 2022-11-23 DIAGNOSIS — N1832 Chronic kidney disease, stage 3b: Secondary | ICD-10-CM

## 2022-11-23 DIAGNOSIS — Z0000 Encounter for general adult medical examination without abnormal findings: Secondary | ICD-10-CM | POA: Diagnosis not present

## 2022-11-23 DIAGNOSIS — E538 Deficiency of other specified B group vitamins: Secondary | ICD-10-CM

## 2022-11-23 DIAGNOSIS — Z0001 Encounter for general adult medical examination with abnormal findings: Secondary | ICD-10-CM

## 2022-11-23 DIAGNOSIS — I10 Essential (primary) hypertension: Secondary | ICD-10-CM

## 2022-11-23 DIAGNOSIS — J449 Chronic obstructive pulmonary disease, unspecified: Secondary | ICD-10-CM

## 2022-11-23 DIAGNOSIS — E559 Vitamin D deficiency, unspecified: Secondary | ICD-10-CM

## 2022-11-23 DIAGNOSIS — E785 Hyperlipidemia, unspecified: Secondary | ICD-10-CM | POA: Diagnosis not present

## 2022-11-23 DIAGNOSIS — M1A09X0 Idiopathic chronic gout, multiple sites, without tophus (tophi): Secondary | ICD-10-CM

## 2022-11-23 LAB — HEPATIC FUNCTION PANEL
ALT: 42 U/L (ref 0–53)
AST: 46 U/L — ABNORMAL HIGH (ref 0–37)
Albumin: 4.1 g/dL (ref 3.5–5.2)
Alkaline Phosphatase: 88 U/L (ref 39–117)
Bilirubin, Direct: 0.2 mg/dL (ref 0.0–0.3)
Total Bilirubin: 0.8 mg/dL (ref 0.2–1.2)
Total Protein: 7.7 g/dL (ref 6.0–8.3)

## 2022-11-23 LAB — CBC WITH DIFFERENTIAL/PLATELET
Basophils Absolute: 0 10*3/uL (ref 0.0–0.1)
Basophils Relative: 0.6 % (ref 0.0–3.0)
Eosinophils Absolute: 0.3 10*3/uL (ref 0.0–0.7)
Eosinophils Relative: 5.1 % — ABNORMAL HIGH (ref 0.0–5.0)
HCT: 41.8 % (ref 39.0–52.0)
Hemoglobin: 13.6 g/dL (ref 13.0–17.0)
Lymphocytes Relative: 27 % (ref 12.0–46.0)
Lymphs Abs: 1.6 10*3/uL (ref 0.7–4.0)
MCHC: 32.5 g/dL (ref 30.0–36.0)
MCV: 96.7 fl (ref 78.0–100.0)
Monocytes Absolute: 0.6 10*3/uL (ref 0.1–1.0)
Monocytes Relative: 10.3 % (ref 3.0–12.0)
Neutro Abs: 3.3 10*3/uL (ref 1.4–7.7)
Neutrophils Relative %: 57 % (ref 43.0–77.0)
Platelets: 195 10*3/uL (ref 150.0–400.0)
RBC: 4.33 Mil/uL (ref 4.22–5.81)
RDW: 14.6 % (ref 11.5–15.5)
WBC: 5.8 10*3/uL (ref 4.0–10.5)

## 2022-11-23 LAB — LIPID PANEL
Cholesterol: 157 mg/dL (ref 0–200)
HDL: 38.5 mg/dL — ABNORMAL LOW (ref >39.00)
NonHDL: 118.13
Total CHOL/HDL Ratio: 4
Triglycerides: 202 mg/dL — ABNORMAL HIGH (ref 0.0–149.0)
VLDL: 40.4 mg/dL — ABNORMAL HIGH (ref 0.0–40.0)

## 2022-11-23 LAB — BASIC METABOLIC PANEL
BUN: 31 mg/dL — ABNORMAL HIGH (ref 6–23)
CO2: 25 mEq/L (ref 19–32)
Calcium: 10.1 mg/dL (ref 8.4–10.5)
Chloride: 104 mEq/L (ref 96–112)
Creatinine, Ser: 1.7 mg/dL — ABNORMAL HIGH (ref 0.40–1.50)
GFR: 36.13 mL/min — ABNORMAL LOW (ref >60.00)
Glucose, Bld: 97 mg/dL (ref 70–99)
Potassium: 4.7 mEq/L (ref 3.5–5.1)
Sodium: 138 mEq/L (ref 135–145)

## 2022-11-23 LAB — IBC PANEL
Iron: 65 ug/dL (ref 42–165)
Saturation Ratios: 19.3 % — ABNORMAL LOW (ref 20.0–50.0)
TIBC: 336 ug/dL (ref 250.0–450.0)
Transferrin: 240 mg/dL (ref 212.0–360.0)

## 2022-11-23 LAB — HEMOGLOBIN A1C: Hgb A1c MFr Bld: 5.8 % (ref 4.6–6.5)

## 2022-11-23 LAB — LDL CHOLESTEROL, DIRECT: Direct LDL: 87 mg/dL

## 2022-11-23 LAB — TSH: TSH: 0.28 u[IU]/mL — ABNORMAL LOW (ref 0.35–5.50)

## 2022-11-23 LAB — FERRITIN: Ferritin: 138 ng/mL (ref 22.0–322.0)

## 2022-11-23 MED ORDER — AMIODARONE HCL 200 MG PO TABS: ORAL_TABLET | ORAL | 0 refills | Status: DC

## 2022-11-23 MED ORDER — LEVOTHYROXINE SODIUM 125 MCG PO TABS: ORAL_TABLET | ORAL | 3 refills | Status: DC

## 2022-11-23 MED ORDER — ALLOPURINOL 300 MG PO TABS: ORAL_TABLET | ORAL | 3 refills | Status: DC

## 2022-11-23 MED ORDER — ISOSORBIDE MONONITRATE ER 60 MG PO TB24: 60.0000 mg | ORAL_TABLET | Freq: Every day | ORAL | 3 refills | Status: DC

## 2022-11-23 NOTE — Patient Instructions (Signed)

## 2022-11-23 NOTE — Assessment & Plan Note (Signed)
Age and sex appropriate education and counseling updated with regular exercise and diet Referrals for preventative services - none needed Immunizations addressed - declines pneumovax, for shingrix at pharmacy Smoking counseling  - none needed Evidence for depression or other mood disorder - none significant Most recent labs reviewed. I have personally reviewed and have noted: 1) the patient's medical and social history 2) The patient's current medications and supplements 3) The patient's height, weight, and BMI have been recorded in the chart

## 2022-11-23 NOTE — Assessment & Plan Note (Signed)
Stable , cont inhaler prn 

## 2022-11-23 NOTE — Assessment & Plan Note (Signed)
Lab Results  Component Value Date   TSH 0.90 11/11/2021   Stable, pt to continue levothyroxine 125 mcg qd
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
## 2022-11-23 NOTE — Assessment & Plan Note (Signed)
Lab Results  Component Value Date   LDLCALC 64 05/07/2021   Stable, pt to continue zetia 10 qd

## 2022-11-23 NOTE — Assessment & Plan Note (Signed)
BP Readings from Last 3 Encounters:  11/23/22 128/76  06/29/22 137/74  05/14/22 120/64   Stable, pt to continue medical treatment - diet, wt control

## 2022-11-23 NOTE — Assessment & Plan Note (Signed)
Lab Results  Component Value Date   CREATININE 1.94 (H) 05/14/2022   Stable overall, cont to avoid nephrotoxins

## 2022-11-23 NOTE — Assessment & Plan Note (Signed)
No overt bleeding, for f/u iron levels 

## 2022-11-23 NOTE — Assessment & Plan Note (Signed)
Lab Results  Component Value Date   HGBA1C 5.7 11/11/2021   Stable, pt to continue current medical treatment  - diet,w t control

## 2022-11-23 NOTE — Assessment & Plan Note (Signed)
Stable, no symptoms of joint involvement

## 2022-11-24 LAB — URINALYSIS, ROUTINE W REFLEX MICROSCOPIC
Bilirubin Urine: NEGATIVE
Hgb urine dipstick: NEGATIVE
Ketones, ur: NEGATIVE
Leukocytes,Ua: NEGATIVE
Nitrite: NEGATIVE
RBC / HPF: NONE SEEN (ref 0–?)
Specific Gravity, Urine: 1.02 (ref 1.000–1.030)
Total Protein, Urine: NEGATIVE
Urine Glucose: NEGATIVE
Urobilinogen, UA: 0.2 (ref 0.0–1.0)
pH: 5.5 (ref 5.0–8.0)

## 2022-11-24 LAB — VITAMIN B12: Vitamin B-12: 250 pg/mL (ref 211–911)

## 2022-11-24 LAB — VITAMIN D 25 HYDROXY (VIT D DEFICIENCY, FRACTURES): VITD: 33.61 ng/mL (ref 30.00–100.00)

## 2022-11-24 NOTE — Progress Notes (Signed)
The test results show that your current treatment is OK, as the tests are stable.  Please continue the same plan.  There is no other need for change of treatment or further evaluation based on these results, at this time.  thanks 

## 2022-12-15 ENCOUNTER — Ambulatory Visit: Payer: PPO | Attending: Physician Assistant | Admitting: Physician Assistant

## 2022-12-15 ENCOUNTER — Encounter: Payer: Self-pay | Admitting: Physician Assistant

## 2022-12-15 VITALS — BP 142/70 | HR 53 | Ht 69.0 in | Wt 178.0 lb

## 2022-12-15 DIAGNOSIS — N1832 Chronic kidney disease, stage 3b: Secondary | ICD-10-CM

## 2022-12-15 DIAGNOSIS — I480 Paroxysmal atrial fibrillation: Secondary | ICD-10-CM | POA: Diagnosis not present

## 2022-12-15 DIAGNOSIS — E785 Hyperlipidemia, unspecified: Secondary | ICD-10-CM

## 2022-12-15 DIAGNOSIS — I25810 Atherosclerosis of coronary artery bypass graft(s) without angina pectoris: Secondary | ICD-10-CM

## 2022-12-15 DIAGNOSIS — I10 Essential (primary) hypertension: Secondary | ICD-10-CM

## 2022-12-15 DIAGNOSIS — J449 Chronic obstructive pulmonary disease, unspecified: Secondary | ICD-10-CM

## 2022-12-15 MED ORDER — AMIODARONE HCL 200 MG PO TABS
ORAL_TABLET | ORAL | 3 refills | Status: DC
Start: 2022-12-15 — End: 2024-01-26

## 2022-12-15 NOTE — Patient Instructions (Signed)
Medication Instructions:  Your physician recommends that you continue on your current medications as directed. Please refer to the Current Medication list given to you today.  *If you need a refill on your cardiac medications before your next appointment, please call your pharmacy*   Lab Work: None ordered  If you have labs (blood work) drawn today and your tests are completely normal, you will receive your results only by: MyChart Message (if you have MyChart) OR A paper copy in the mail If you have any lab test that is abnormal or we need to change your treatment, we will call you to review the results.   Testing/Procedures: None ordered   Follow-Up: At [facility], you and your health needs are our priority.  As part of our continuing mission to provide you with exceptional heart care, we have created designated Provider Care Teams.  These Care Teams include your primary Cardiologist (physician) and Advanced Practice Providers (APPs -  Physician Assistants and Nurse Practitioners) who all work together to provide you with the care you need, when you need it.  We recommend signing up for the patient portal called "MyChart".  Sign up information is provided on this After Visit Summary.  MyChart is used to connect with patients for Virtual Visits (Telemedicine).  Patients are able to view lab/test results, encounter notes, upcoming appointments, etc.  Non-urgent messages can be sent to your provider as well.   To learn more about what you can do with MyChart, go to [email or web address].    Your next appointment:   Someone will call you  Provider:   Chrystie Nose, MD     Other Instructions

## 2022-12-15 NOTE — Progress Notes (Unsigned)
Cardiology Office Note:  .   Date:  12/15/2022  ID:  [name], DOB 08/26/1934, MRN [id number] PCP: Corwin Levins, MD  [facility] Providers Cardiologist:  Chrystie Nose, MD { Click to update primary MD,subspecialty MD or APP then REFRESH:1}   History of Present Illness: .   Omar Benson is a 88 y.o. male with a hx of CAD s/p CABG, CKD stage III, atrial flutter s/p ablation and afib, HTN and COPD. His cardiac catheterization was in February 2017 which showed severe three-vessel occlusive CAD, patent LIMA to LAD, patent SVG to first diagonal, patent SVG to RCA and severely diseased SVG to OM unchanged compared to 2013 which is not amenable to PCI. Medical therapy was elected at the time. He was admitted for unstable angina on 12/19/2016. Troponin did trend up to 0.9. However he was felt not to be a good interventional candidate due to nonoperable graft disease. Echocardiogram obtained on 12/20/2016 showed EF 55-60%, grade 1 DD. A few days after his discharge, he returned to the hospital on 12/29/2016. He also had acute on chronic renal insufficiency with creatinine 2.1. He ultimately underwent cardiac catheterization on 01/30/2017 which showed 100% proximal RCA, 100% ostial LAD, patent LIMA to LAD, 100% proximal left circumflex with severely diseased proximal and distal SVG to OM, 75% SVG to diagonal, proximal part of SVG to PDA/PLA were patent, however unable to visualize the entire graft. He returned to the cath lab on the following day and underwent drug-eluting stent to distal RCA prior to PLA takeoff. He also underwent successful PCI with drug-eluting stent to SVG to diagonal.   AAA ultrasound obtained on 03/17/2017 showed largest aorta measuring 3.4 cm, greater than 50% stenosis in the right common iliac artery.  He has intolerant of statins.  I last saw the patient in February 2022 at which time he was doing well.  Patient presents today for follow-up accompanied by daughter.  He continues to  have occasional pinpoint chest discomfort that has been chronic over the many years.  It does not occur with physical activity.  He has previous anginal symptom was more throat pain.  He is able to exercise on the treadmill without any exertional chest pain or worsening dyspnea.  Over the years, he has been intolerant of statins, Zetia and refused any kind of injectable medication.  I think you may be a good candidate for Nexletol, I will try to get him in with Dr. Blanchie Dessert lipid clinic to discuss Nexletol.  Patient is willing to consider.  EKG is unchanged.  Once he started on Nexletol, he can resume yearly follow-up.  Blood pressure on arrival was 142/70, manual repeat by myself was 134/78.  Systolic blood pressure has been around 125 at home per patient report.    ROS: ***  Studies Reviewed: .        *** Risk Assessment/Calculations:   {Does this patient have ATRIAL FIBRILLATION?:[phone]} The patient's 1st BP is elevated (>139/89)*** Repeat BP and {Click to enter a 2nd BP Refresh Note  :1}       Physical Exam:   VS:  BP (!) 142/70 (BP Location: Right Arm, Patient Position: Sitting, Cuff Size: Normal)   Pulse (!) 53   Ht 5\' 9"  (1.753 m)   Wt 178 lb (80.7 kg)   SpO2 93%   BMI 26.29 kg/m    Wt Readings from Last 3 Encounters:  12/15/22 178 lb (80.7 kg)  11/23/22 178 lb (80.7 kg)  05/14/22 183 lb (83 kg)    GEN: Well nourished, well developed in no acute distress NECK: No JVD; No carotid bruits CARDIAC: ***RRR, no murmurs, rubs, gallops RESPIRATORY:  Clear to auscultation without rales, wheezing or rhonchi  ABDOMEN: Soft, non-tender, non-distended EXTREMITIES:  No edema; No deformity   ASSESSMENT AND PLAN: .   ***    {Are you ordering a CV Procedure (e.g. stress test, cath, DCCV, TEE, etc)?   Press F2        :[id number]}  Dispo: ***  Signed, Azalee Course, PA

## 2022-12-18 ENCOUNTER — Encounter: Payer: Self-pay | Admitting: Internal Medicine

## 2022-12-18 ENCOUNTER — Ambulatory Visit: Payer: PPO | Attending: Internal Medicine | Admitting: Internal Medicine

## 2022-12-18 VITALS — BP 110/64 | HR 58 | Ht 69.0 in | Wt 179.0 lb

## 2022-12-18 DIAGNOSIS — E785 Hyperlipidemia, unspecified: Secondary | ICD-10-CM | POA: Diagnosis not present

## 2022-12-18 DIAGNOSIS — I10 Essential (primary) hypertension: Secondary | ICD-10-CM | POA: Diagnosis not present

## 2022-12-18 DIAGNOSIS — I25810 Atherosclerosis of coronary artery bypass graft(s) without angina pectoris: Secondary | ICD-10-CM | POA: Diagnosis not present

## 2022-12-18 DIAGNOSIS — I480 Paroxysmal atrial fibrillation: Secondary | ICD-10-CM

## 2022-12-18 MED ORDER — EZETIMIBE 10 MG PO TABS: 10.0000 mg | ORAL_TABLET | Freq: Every day | ORAL | 3 refills | Status: DC
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
## 2022-12-18 NOTE — Patient Instructions (Signed)
Medication Instructions:  RESUME zetia 10mg  daily  *If you need a refill on your cardiac medications before your next appointment, please call your pharmacy*   Lab Work: FASTING lab work to check cholesterol in 3-4 months  If you have labs (blood work) drawn today and your tests are completely normal, you will receive your results only by: MyChart Message (if you have MyChart) OR A paper copy in the mail If you have any lab test that is abnormal or we need to change your treatment, we will call you to review the results.    Follow-Up: At [facility], you and your health needs are our priority.  As part of our continuing mission to provide you with exceptional heart care, we have created designated Provider Care Teams.  These Care Teams include your primary Cardiologist (physician) and Advanced Practice Providers (APPs -  Physician Assistants and Nurse Practitioners) who all work together to provide you with the care you need, when you need it.  We recommend signing up for the patient portal called "MyChart".  Sign up information is provided on this After Visit Summary.  MyChart is used to connect with patients for Virtual Visits (Telemedicine).  Patients are able to view lab/test results, encounter notes, upcoming appointments, etc.  Non-urgent messages can be sent to your provider as well.   To learn more about what you can do with MyChart, go to [email or web address].    Your next appointment:    12 months with Dr. Rennis Golden

## 2023-05-04 ENCOUNTER — Other Ambulatory Visit: Payer: Self-pay | Admitting: Internal Medicine

## 2023-05-04 DIAGNOSIS — I480 Paroxysmal atrial fibrillation: Secondary | ICD-10-CM

## 2023-05-26 ENCOUNTER — Encounter: Payer: Self-pay | Admitting: Internal Medicine

## 2023-05-26 ENCOUNTER — Ambulatory Visit (INDEPENDENT_AMBULATORY_CARE_PROVIDER_SITE_OTHER): Payer: PPO | Admitting: Internal Medicine

## 2023-05-26 VITALS — BP 124/82 | HR 50 | Temp 97.8°F | Ht 69.0 in | Wt 174.0 lb

## 2023-05-26 DIAGNOSIS — Z0001 Encounter for general adult medical examination with abnormal findings: Secondary | ICD-10-CM | POA: Diagnosis not present

## 2023-05-26 DIAGNOSIS — J309 Allergic rhinitis, unspecified: Secondary | ICD-10-CM

## 2023-05-26 DIAGNOSIS — L814 Other melanin hyperpigmentation: Secondary | ICD-10-CM | POA: Insufficient documentation

## 2023-05-26 DIAGNOSIS — Z85828 Personal history of other malignant neoplasm of skin: Secondary | ICD-10-CM | POA: Insufficient documentation

## 2023-05-26 DIAGNOSIS — E785 Hyperlipidemia, unspecified: Secondary | ICD-10-CM | POA: Diagnosis not present

## 2023-05-26 DIAGNOSIS — I10 Essential (primary) hypertension: Secondary | ICD-10-CM

## 2023-05-26 DIAGNOSIS — J449 Chronic obstructive pulmonary disease, unspecified: Secondary | ICD-10-CM

## 2023-05-26 DIAGNOSIS — R739 Hyperglycemia, unspecified: Secondary | ICD-10-CM | POA: Diagnosis not present

## 2023-05-26 DIAGNOSIS — D1801 Hemangioma of skin and subcutaneous tissue: Secondary | ICD-10-CM | POA: Insufficient documentation

## 2023-05-26 DIAGNOSIS — L821 Other seborrheic keratosis: Secondary | ICD-10-CM | POA: Insufficient documentation

## 2023-05-26 DIAGNOSIS — N1832 Chronic kidney disease, stage 3b: Secondary | ICD-10-CM

## 2023-05-26 DIAGNOSIS — C44310 Basal cell carcinoma of skin of unspecified parts of face: Secondary | ICD-10-CM | POA: Insufficient documentation

## 2023-05-26 DIAGNOSIS — D225 Melanocytic nevi of trunk: Secondary | ICD-10-CM | POA: Insufficient documentation

## 2023-05-26 LAB — BASIC METABOLIC PANEL
BUN: 32 mg/dL — ABNORMAL HIGH (ref 6–23)
CO2: 27 meq/L (ref 19–32)
Calcium: 10.2 mg/dL (ref 8.4–10.5)
Chloride: 105 meq/L (ref 96–112)
Creatinine, Ser: 1.64 mg/dL — ABNORMAL HIGH (ref 0.40–1.50)
GFR: 37.59 mL/min — ABNORMAL LOW (ref >60.00)
Glucose, Bld: 101 mg/dL — ABNORMAL HIGH (ref 70–99)
Potassium: 5.1 meq/L (ref 3.5–5.1)
Sodium: 138 meq/L (ref 135–145)

## 2023-05-26 LAB — TSH: TSH: 0.36 u[IU]/mL (ref 0.35–5.50)

## 2023-05-26 LAB — URINALYSIS, ROUTINE W REFLEX MICROSCOPIC
Bilirubin Urine: NEGATIVE
Hgb urine dipstick: NEGATIVE
Ketones, ur: NEGATIVE
Leukocytes,Ua: NEGATIVE
Nitrite: NEGATIVE
RBC / HPF: NONE SEEN (ref 0–?)
Specific Gravity, Urine: 1.025 (ref 1.000–1.030)
Total Protein, Urine: NEGATIVE
Urine Glucose: NEGATIVE
Urobilinogen, UA: 0.2 (ref 0.0–1.0)
pH: 5.5 (ref 5.0–8.0)

## 2023-05-26 LAB — LIPID PANEL
Cholesterol: 154 mg/dL (ref 0–200)
HDL: 42.5 mg/dL (ref >39.00)
LDL Cholesterol: 74 mg/dL (ref 0–99)
NonHDL: 111.19
Total CHOL/HDL Ratio: 4
Triglycerides: 186 mg/dL — ABNORMAL HIGH (ref 0.0–149.0)
VLDL: 37.2 mg/dL (ref 0.0–40.0)

## 2023-05-26 LAB — HEPATIC FUNCTION PANEL
ALT: 40 U/L (ref 0–53)
AST: 39 U/L — ABNORMAL HIGH (ref 0–37)
Albumin: 4.4 g/dL (ref 3.5–5.2)
Alkaline Phosphatase: 102 U/L (ref 39–117)
Bilirubin, Direct: 0.2 mg/dL (ref 0.0–0.3)
Total Bilirubin: 0.6 mg/dL (ref 0.2–1.2)
Total Protein: 7.6 g/dL (ref 6.0–8.3)

## 2023-05-26 LAB — CBC WITH DIFFERENTIAL/PLATELET
Basophils Absolute: 0 10*3/uL (ref 0.0–0.1)
Basophils Relative: 0.7 % (ref 0.0–3.0)
Eosinophils Absolute: 0.3 10*3/uL (ref 0.0–0.7)
Eosinophils Relative: 4.6 % (ref 0.0–5.0)
HCT: 40.2 % (ref 39.0–52.0)
Hemoglobin: 13.4 g/dL (ref 13.0–17.0)
Lymphocytes Relative: 23.1 % (ref 12.0–46.0)
Lymphs Abs: 1.4 10*3/uL (ref 0.7–4.0)
MCHC: 33.4 g/dL (ref 30.0–36.0)
MCV: 97.3 fL (ref 78.0–100.0)
Monocytes Absolute: 0.6 10*3/uL (ref 0.1–1.0)
Monocytes Relative: 9.9 % (ref 3.0–12.0)
Neutro Abs: 3.7 10*3/uL (ref 1.4–7.7)
Neutrophils Relative %: 61.7 % (ref 43.0–77.0)
Platelets: 207 10*3/uL (ref 150.0–400.0)
RBC: 4.13 Mil/uL — ABNORMAL LOW (ref 4.22–5.81)
RDW: 14.2 % (ref 11.5–15.5)
WBC: 6 10*3/uL (ref 4.0–10.5)

## 2023-05-26 LAB — HEMOGLOBIN A1C: Hgb A1c MFr Bld: 5.8 % (ref 4.6–6.5)

## 2023-05-26 NOTE — Patient Instructions (Signed)
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
## 2023-05-26 NOTE — Progress Notes (Signed)
 The test results show that your current treatment is OK, as the tests are stable.  Please continue the same plan.  There is no other need for change of treatment or further evaluation based on these results, at this time.  thanks

## 2023-05-28 ENCOUNTER — Encounter: Payer: Self-pay | Admitting: Internal Medicine

## 2023-05-28 NOTE — Assessment & Plan Note (Signed)
 Mild to mod, for otc allegra and/or nasacort asd,  to f/u any worsening symptoms or concerns

## 2023-05-28 NOTE — Assessment & Plan Note (Signed)
 BP Readings from Last 3 Encounters:  05/26/23 124/82  12/18/22 110/64  12/15/22 (!) 142/70   Stable, pt to continue medical treatment  - diet, wt control

## 2023-05-28 NOTE — Assessment & Plan Note (Signed)
Age and sex appropriate education and counseling updated with regular exercise and diet Referrals for preventative services - none needed Immunizations addressed - declines all Smoking counseling  - none needed Evidence for depression or other mood disorder - none significant Most recent labs reviewed. I have personally reviewed and have noted: 1) the patient's medical and social history 2) The patient's current medications and supplements 3) The patient's height, weight, and BMI have been recorded in the chart

## 2023-05-28 NOTE — Assessment & Plan Note (Signed)
 Lab Results  Component Value Date   LDLCALC 74 05/26/2023   uncontrolled, for lower chol diet, declines statin for now

## 2023-05-28 NOTE — Assessment & Plan Note (Signed)
 Lab Results  Component Value Date   CREATININE 1.64 (H) 05/26/2023   Stable overall, cont to avoid nephrotoxins

## 2023-05-28 NOTE — Assessment & Plan Note (Signed)
 Lab Results  Component Value Date   HGBA1C 5.8 05/26/2023   Stable, pt to continue current medical treatment  -  diet, wt control

## 2023-05-28 NOTE — Assessment & Plan Note (Signed)
Stable overall, cont inhaler prn 

## 2023-06-12 ENCOUNTER — Ambulatory Visit
Admission: EM | Admit: 2023-06-12 | Discharge: 2023-06-12 | Disposition: A | Discharge status: Home / Self Care | Payer: PPO | Attending: Family Medicine | Admitting: Family Medicine

## 2023-06-12 DIAGNOSIS — B356 Tinea cruris: Secondary | ICD-10-CM | POA: Diagnosis not present

## 2023-06-12 NOTE — Discharge Instructions (Signed)
You may use over the counter clotrimazole cream twice daily for the next couple of weeks. Keep area as dry as possible.

## 2023-06-12 NOTE — ED Triage Notes (Signed)
Patient presents with a rash in crease in right thigh x 1 week. Treated with Hydrocortisone cream and baking soda this morning without relief.
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
## 2023-07-01 ENCOUNTER — Telehealth: Payer: Self-pay | Admitting: Internal Medicine

## 2023-07-01 DIAGNOSIS — J3489 Other specified disorders of nose and nasal sinuses: Secondary | ICD-10-CM

## 2023-07-01 NOTE — Telephone Encounter (Signed)
Ok this is done

## 2023-07-01 NOTE — Telephone Encounter (Signed)
Copied from CRM [id number]. Topic: Referral - Request for Referral >> Jul 01, 2023 12:05 PM Alona Bene A wrote: Did the patient discuss referral with their provider in the last year? Yes (If No - schedule appointment) (If Yes - send message)  Appointment offered? Yes - Patient does not want to com in office as this is something that was already discussed per patient.   Type of order/referral and detailed reason for visit: ENT Doctor  Preference of office, provider, location: [address] [place], [place] Fax: [phone]  If referral order, have you been seen by this specialty before? No (If Yes, this issue or another issue? When? Where?  Can we respond through MyChart? Yes

## 2023-07-09 ENCOUNTER — Ambulatory Visit: Payer: PPO | Admitting: Internal Medicine

## 2023-07-09 ENCOUNTER — Encounter: Payer: Self-pay | Admitting: Internal Medicine

## 2023-07-09 VITALS — BP 110/64 | HR 52 | Temp 98.0°F | Ht 69.0 in | Wt 176.0 lb

## 2023-07-09 DIAGNOSIS — R519 Headache, unspecified: Secondary | ICD-10-CM

## 2023-07-09 DIAGNOSIS — J309 Allergic rhinitis, unspecified: Secondary | ICD-10-CM | POA: Diagnosis not present

## 2023-07-09 DIAGNOSIS — J069 Acute upper respiratory infection, unspecified: Secondary | ICD-10-CM

## 2023-07-09 DIAGNOSIS — R42 Dizziness and giddiness: Secondary | ICD-10-CM | POA: Diagnosis not present

## 2023-07-09 LAB — SEDIMENTATION RATE: Sed Rate: 23 mm/h — ABNORMAL HIGH (ref 0–20)

## 2023-07-09 LAB — C-REACTIVE PROTEIN: CRP: <1 mg/dL (ref 0.5–20.0)

## 2023-07-09 MED ORDER — PREDNISONE 10 MG PO TABS: ORAL_TABLET | ORAL | 0 refills | Status: DC

## 2023-07-09 MED ORDER — TRIAMCINOLONE ACETONIDE 55 MCG/ACT NA AERO: 2.0000 | INHALATION_SPRAY | Freq: Every day | NASAL | 12 refills | Status: AC

## 2023-07-09 MED ORDER — CEFDINIR 300 MG PO CAPS: 300.0000 mg | ORAL_CAPSULE | Freq: Two times a day (BID) | ORAL | 0 refills | Status: DC

## 2023-07-09 MED ORDER — MECLIZINE HCL 12.5 MG PO TABS: 12.5000 mg | ORAL_TABLET | Freq: Three times a day (TID) | ORAL | 1 refills | Status: DC | PRN

## 2023-07-09 MED ORDER — NEOMYCIN-POLYMYXIN-HC 3.5-10000-1 OT SOLN: 4.0000 [drp] | Freq: Four times a day (QID) | OTIC | 0 refills | Status: DC
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
## 2023-07-09 NOTE — Patient Instructions (Signed)
Please take all new medication as prescribed - the antibiotic pill and ear drops, prednisone, nasacort, and meclizine as needed for vertigo  Please continue all other medications as before, and refills have been done if requested.  Please have the pharmacy call with any other refills you may need.  Please keep your appointments with your specialists as you may have planned - ENT in April 2025  Please call if not improved for CT sinus scan  Please go to the LAB at the blood drawing area for the tests to be done - the sed rate testing for the temple pain  You will be contacted by phone if any changes need to be made immediately.  Otherwise, you will receive a letter about your results with an explanation, but please check with MyChart first.

## 2023-07-11 ENCOUNTER — Encounter: Payer: Self-pay | Admitting: Internal Medicine

## 2023-07-11 DIAGNOSIS — R519 Headache, unspecified: Secondary | ICD-10-CM | POA: Insufficient documentation

## 2023-07-11 NOTE — Assessment & Plan Note (Signed)
 Mild to mod, for prednisone taper and nasacort asd,,  to f/u any worsening symptoms or concerns

## 2023-07-11 NOTE — Assessment & Plan Note (Signed)
Mild to mod, for antibx course - omnicef 300 bid,  to f/u any worsening symptoms or concerns

## 2023-07-11 NOTE — Assessment & Plan Note (Signed)
 Mild intermittent for meclizine 12. 5 mg prn

## 2023-07-11 NOTE — Assessment & Plan Note (Signed)
 Etiology unclear, for esr crp r/o temporal arteritis

## 2023-07-21 ENCOUNTER — Encounter: Payer: Self-pay | Admitting: Internal Medicine

## 2023-07-21 ENCOUNTER — Ambulatory Visit (INDEPENDENT_AMBULATORY_CARE_PROVIDER_SITE_OTHER): Admitting: Internal Medicine

## 2023-07-21 VITALS — BP 122/64 | HR 55 | Temp 98.5°F | Ht 69.0 in | Wt 175.0 lb

## 2023-07-21 DIAGNOSIS — J449 Chronic obstructive pulmonary disease, unspecified: Secondary | ICD-10-CM

## 2023-07-21 DIAGNOSIS — R42 Dizziness and giddiness: Secondary | ICD-10-CM | POA: Diagnosis not present

## 2023-07-21 DIAGNOSIS — I10 Essential (primary) hypertension: Secondary | ICD-10-CM

## 2023-07-21 DIAGNOSIS — J309 Allergic rhinitis, unspecified: Secondary | ICD-10-CM

## 2023-07-21 NOTE — Patient Instructions (Signed)
 Ok to take the Prednisone and the Meclizine you have at home   You can also take Mucinex (or it's generic off brand) for congestion, and tylenol as needed for pain, and also Benadryl or Childrens Claritin as needed  Please continue all other medications as before, and refills have been done if requested.  Please have the pharmacy call with any other refills you may need.  Please keep your appointments with your specialists as you may have planned
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
## 2023-07-24 ENCOUNTER — Encounter: Payer: Self-pay | Admitting: Internal Medicine

## 2023-07-24 NOTE — Assessment & Plan Note (Signed)
Stable overall, for contd inhaler prn

## 2023-07-24 NOTE — Assessment & Plan Note (Signed)
Mild to mod, for meclizine prn,,  to f/u any worsening symptoms or concerns

## 2023-07-24 NOTE — Assessment & Plan Note (Signed)
 Mild to mod, for prednisone taper, to f/u any worsening symptoms or concerns

## 2023-07-24 NOTE — Assessment & Plan Note (Signed)
 BP Readings from Last 3 Encounters:  07/21/23 122/64  07/09/23 110/64  06/12/23 (!) 145/71   Stable, pt to continue medical treatment  - diet, wt control

## 2023-08-23 ENCOUNTER — Ambulatory Visit (INDEPENDENT_AMBULATORY_CARE_PROVIDER_SITE_OTHER): Payer: No Typology Code available for payment source | Admitting: Otolaryngology

## 2023-08-23 ENCOUNTER — Encounter (INDEPENDENT_AMBULATORY_CARE_PROVIDER_SITE_OTHER): Payer: Self-pay | Admitting: Otolaryngology

## 2023-08-23 ENCOUNTER — Encounter (INDEPENDENT_AMBULATORY_CARE_PROVIDER_SITE_OTHER): Payer: Self-pay

## 2023-08-23 VITALS — BP 134/73 | HR 73 | Ht 69.0 in | Wt 178.0 lb

## 2023-08-23 DIAGNOSIS — R0981 Nasal congestion: Secondary | ICD-10-CM

## 2023-08-23 DIAGNOSIS — J3089 Other allergic rhinitis: Secondary | ICD-10-CM

## 2023-08-23 DIAGNOSIS — H578A1 Foreign body sensation, right eye: Secondary | ICD-10-CM | POA: Diagnosis not present

## 2023-08-23 DIAGNOSIS — R0982 Postnasal drip: Secondary | ICD-10-CM

## 2023-08-23 DIAGNOSIS — R42 Dizziness and giddiness: Secondary | ICD-10-CM

## 2023-08-23 DIAGNOSIS — J3489 Other specified disorders of nose and nasal sinuses: Secondary | ICD-10-CM

## 2023-08-23 DIAGNOSIS — H8110 Benign paroxysmal vertigo, unspecified ear: Secondary | ICD-10-CM

## 2023-08-23 DIAGNOSIS — J342 Deviated nasal septum: Secondary | ICD-10-CM

## 2023-08-23 DIAGNOSIS — J343 Hypertrophy of nasal turbinates: Secondary | ICD-10-CM

## 2023-08-23 MED ORDER — TRIAMCINOLONE ACETONIDE 55 MCG/ACT NA AERO: 2.0000 | INHALATION_SPRAY | Freq: Every day | NASAL | 12 refills | Status: DC

## 2023-08-23 MED ORDER — FLUTICASONE PROPIONATE 50 MCG/ACT NA SUSP: 2.0000 | Freq: Two times a day (BID) | NASAL | 6 refills | Status: DC
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
## 2023-11-15 ENCOUNTER — Other Ambulatory Visit: Payer: Self-pay | Admitting: Internal Medicine

## 2023-11-15 DIAGNOSIS — M1A09X0 Idiopathic chronic gout, multiple sites, without tophus (tophi): Secondary | ICD-10-CM

## 2023-11-16 ENCOUNTER — Other Ambulatory Visit: Payer: Self-pay

## 2023-11-23 ENCOUNTER — Ambulatory Visit (INDEPENDENT_AMBULATORY_CARE_PROVIDER_SITE_OTHER)

## 2023-11-23 ENCOUNTER — Encounter: Payer: Self-pay | Admitting: Internal Medicine

## 2023-11-23 ENCOUNTER — Ambulatory Visit (INDEPENDENT_AMBULATORY_CARE_PROVIDER_SITE_OTHER): Payer: PPO | Admitting: Internal Medicine

## 2023-11-23 VITALS — BP 158/84 | HR 50 | Temp 95.0°F | Ht 69.0 in | Wt 175.8 lb

## 2023-11-23 DIAGNOSIS — R739 Hyperglycemia, unspecified: Secondary | ICD-10-CM

## 2023-11-23 DIAGNOSIS — R0789 Other chest pain: Secondary | ICD-10-CM

## 2023-11-23 DIAGNOSIS — I452 Bifascicular block: Secondary | ICD-10-CM | POA: Insufficient documentation

## 2023-11-23 DIAGNOSIS — E785 Hyperlipidemia, unspecified: Secondary | ICD-10-CM

## 2023-11-23 LAB — HEPATIC FUNCTION PANEL
ALT: 29 U/L (ref 0–53)
AST: 29 U/L (ref 0–37)
Albumin: 4.4 g/dL (ref 3.5–5.2)
Alkaline Phosphatase: 92 U/L (ref 39–117)
Bilirubin, Direct: 0.2 mg/dL (ref 0.0–0.3)
Total Bilirubin: 0.7 mg/dL (ref 0.2–1.2)
Total Protein: 7.9 g/dL (ref 6.0–8.3)

## 2023-11-23 LAB — TROPONIN I (HIGH SENSITIVITY): High Sens Troponin I: 16 ng/L (ref 2–17)

## 2023-11-23 LAB — LIPID PANEL
Cholesterol: 151 mg/dL (ref 0–200)
HDL: 41.7 mg/dL (ref >39.00)
LDL Cholesterol: 57 mg/dL (ref 0–99)
NonHDL: 108.95
Total CHOL/HDL Ratio: 4
Triglycerides: 261 mg/dL — ABNORMAL HIGH (ref 0.0–149.0)
VLDL: 52.2 mg/dL — ABNORMAL HIGH (ref 0.0–40.0)

## 2023-11-23 LAB — CBC WITH DIFFERENTIAL/PLATELET
Basophils Absolute: 0 K/uL (ref 0.0–0.1)
Basophils Relative: 0.7 % (ref 0.0–3.0)
Eosinophils Absolute: 0.3 K/uL (ref 0.0–0.7)
Eosinophils Relative: 4.5 % (ref 0.0–5.0)
HCT: 39.7 % (ref 39.0–52.0)
Hemoglobin: 13.2 g/dL (ref 13.0–17.0)
Lymphocytes Relative: 24.2 % (ref 12.0–46.0)
Lymphs Abs: 1.5 K/uL (ref 0.7–4.0)
MCHC: 33.1 g/dL (ref 30.0–36.0)
MCV: 95.6 fl (ref 78.0–100.0)
Monocytes Absolute: 0.7 K/uL (ref 0.1–1.0)
Monocytes Relative: 10.7 % (ref 3.0–12.0)
Neutro Abs: 3.6 K/uL (ref 1.4–7.7)
Neutrophils Relative %: 59.9 % (ref 43.0–77.0)
Platelets: 192 K/uL (ref 150.0–400.0)
RBC: 4.15 Mil/uL — ABNORMAL LOW (ref 4.22–5.81)
RDW: 14.2 % (ref 11.5–15.5)
WBC: 6.1 K/uL (ref 4.0–10.5)

## 2023-11-23 LAB — BASIC METABOLIC PANEL WITH GFR
BUN: 31 mg/dL — ABNORMAL HIGH (ref 6–23)
CO2: 27 meq/L (ref 19–32)
Calcium: 10.2 mg/dL (ref 8.4–10.5)
Chloride: 104 meq/L (ref 96–112)
Creatinine, Ser: 1.86 mg/dL — ABNORMAL HIGH (ref 0.40–1.50)
GFR: 32.21 mL/min — ABNORMAL LOW (ref >60.00)
Glucose, Bld: 100 mg/dL — ABNORMAL HIGH (ref 70–99)
Potassium: 4.7 meq/L (ref 3.5–5.1)
Sodium: 139 meq/L (ref 135–145)

## 2023-11-23 LAB — TSH: TSH: 0.54 u[IU]/mL (ref 0.35–5.50)

## 2023-11-23 LAB — HEMOGLOBIN A1C: Hgb A1c MFr Bld: 5.8 % (ref 4.6–6.5)

## 2023-11-23 NOTE — Assessment & Plan Note (Signed)
 Mild intermittent, etiology unclear, for troponin with lab, echo and cardiology f/u as above

## 2023-11-23 NOTE — Patient Instructions (Signed)
 Your EKG was done today  Please continue all other medications as before, and refills have been done if requested.  Please have the pharmacy call with any other refills you may need.  Please keep your appointments with your specialists as you may have planned  You will be contacted regarding the referral for: Dr Mona, and Echocardiogram  Please go to the XRAY Department in the first floor for the x-ray testing  Please go to the LAB at the blood drawing area for the tests to be done  You will be contacted by phone if any changes need to be made immediately.  Otherwise, you will receive a letter about your results with an explanation, but please check with MyChart first.  Please make an Appointment to return in 3 months, or sooner if needed

## 2023-11-23 NOTE — Assessment & Plan Note (Signed)
 Lab Results  Component Value Date   LDLCALC 74 05/26/2023   uncontrolled, pt to continue low chol diet, declines statin for now

## 2023-11-23 NOTE — Assessment & Plan Note (Addendum)
 Ecg reviewed, New compared to 2022, possibly related to current symptoms; cont current meds but for cxr, lab including cbc, bmp, also for echocardiogram and card f/u referral

## 2023-11-23 NOTE — Assessment & Plan Note (Signed)
 Lab Results  Component Value Date   HGBA1C 5.8 05/26/2023   Stable, pt to continue current medical treatment  -  diet, wt control
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
## 2023-11-24 ENCOUNTER — Ambulatory Visit: Payer: Self-pay | Admitting: Internal Medicine

## 2023-11-24 LAB — URINALYSIS, ROUTINE W REFLEX MICROSCOPIC
Bilirubin Urine: NEGATIVE
Hgb urine dipstick: NEGATIVE
Ketones, ur: NEGATIVE
Leukocytes,Ua: NEGATIVE
Nitrite: NEGATIVE
RBC / HPF: NONE SEEN (ref 0–?)
Specific Gravity, Urine: 1.015 (ref 1.000–1.030)
Total Protein, Urine: NEGATIVE
Urine Glucose: NEGATIVE
Urobilinogen, UA: 0.2 (ref 0.0–1.0)
WBC, UA: NONE SEEN (ref 0–?)
pH: 6 (ref 5.0–8.0)

## 2023-11-24 NOTE — Progress Notes (Signed)
 The test results show that your current treatment is OK, as the tests are stable., including the kidney function.   Please continue the same plan and follow up with the kidney doctor and cardiology as planned.   There is no other need for change of treatment or further evaluation based on these results, at this time.  thanks

## 2023-11-25 ENCOUNTER — Ambulatory Visit (HOSPITAL_COMMUNITY)
Admission: RE | Admit: 2023-11-25 | Discharge: 2023-11-25 | Disposition: A | Discharge status: Home / Self Care | Source: Ambulatory Visit | Attending: Internal Medicine | Admitting: Internal Medicine

## 2023-11-25 DIAGNOSIS — R9431 Abnormal electrocardiogram [ECG] [EKG]: Secondary | ICD-10-CM

## 2023-11-25 DIAGNOSIS — R0789 Other chest pain: Secondary | ICD-10-CM | POA: Diagnosis present

## 2023-11-25 LAB — ECHOCARDIOGRAM COMPLETE
Area-P 1/2: 1.38 cm2
S' Lateral: 3.9 cm

## 2023-11-26 ENCOUNTER — Encounter: Payer: Self-pay | Admitting: *Deleted

## 2023-11-29 ENCOUNTER — Ambulatory Visit: Attending: Emergency Medicine | Admitting: Emergency Medicine

## 2023-11-29 ENCOUNTER — Encounter: Payer: Self-pay | Admitting: Emergency Medicine

## 2023-11-29 VITALS — BP 118/60 | HR 52 | Ht 69.0 in | Wt 177.0 lb

## 2023-11-29 DIAGNOSIS — E785 Hyperlipidemia, unspecified: Secondary | ICD-10-CM

## 2023-11-29 DIAGNOSIS — I25810 Atherosclerosis of coronary artery bypass graft(s) without angina pectoris: Secondary | ICD-10-CM

## 2023-11-29 DIAGNOSIS — N1832 Chronic kidney disease, stage 3b: Secondary | ICD-10-CM

## 2023-11-29 DIAGNOSIS — J449 Chronic obstructive pulmonary disease, unspecified: Secondary | ICD-10-CM

## 2023-11-29 DIAGNOSIS — R5383 Other fatigue: Secondary | ICD-10-CM

## 2023-11-29 DIAGNOSIS — R002 Palpitations: Secondary | ICD-10-CM

## 2023-11-29 DIAGNOSIS — I4892 Unspecified atrial flutter: Secondary | ICD-10-CM

## 2023-11-29 NOTE — Patient Instructions (Signed)
 Medication Instructions:  NO CHANGES   Lab Work: NONE   Testing/Procedures: GEOFFRY HEWS- Long Term Monitor Instructions  Your physician has requested you wear a ZIO patch monitor for 7 days.  This is a single patch monitor. [employer] supplies one patch monitor per enrollment. Additional stickers are not available. Please do not apply patch if you will be having a Nuclear Stress Test,  Echocardiogram, Cardiac CT, MRI, or Chest Xray during the period you would be wearing the  monitor. The patch cannot be worn during these tests. You cannot remove and re-apply the  ZIO XT patch monitor.  Your ZIO patch monitor will be mailed 3 day USPS to your address on file. It may take 3-5 days  to receive your monitor after you have been enrolled.  Once you have received your monitor, please review the enclosed instructions. Your monitor  has already been registered assigning a specific monitor serial # to you.  Billing and Patient Assistance Program Information  We have supplied [employer] with any of your insurance information on file for billing purposes. [employer] offers a sliding scale Patient Assistance Program for patients that do not have  insurance, or whose insurance does not completely cover the cost of the ZIO monitor.  You must apply for the Patient Assistance Program to qualify for this discounted rate.  To apply, please call [employer] at [phone], select option 4, select option 2, ask to apply for  Patient Assistance Program. Meredeth will ask your household income, and how many people  are in your household. They will quote your out-of-pocket cost based on that information.  [employer] will also be able to set up a 8-month, interest-free payment plan if needed.  Applying the monitor   Shave hair from upper left chest.  Hold abrader disc by orange tab. Rub abrader in 40 strokes over the upper left chest as  indicated in your monitor instructions.  Clean area with 4 enclosed alcohol pads. Let  dry.  Apply patch as indicated in monitor instructions. Patch will be placed under collarbone on left  side of chest with arrow pointing upward.  Rub patch adhesive wings for 2 minutes. Remove white label marked 1. Remove the white  label marked 2. Rub patch adhesive wings for 2 additional minutes.  While looking in a mirror, press and release button in center of patch. A small green light will  flash 3-4 times. This will be your only indicator that the monitor has been turned on.  Do not shower for the first 24 hours. You may shower after the first 24 hours.  Press the button if you feel a symptom. You will hear a small click. Record Date, Time and  Symptom in the Patient Logbook.  When you are ready to remove the patch, follow instructions on the last 2 pages of Patient  Logbook. Stick patch monitor onto the last page of Patient Logbook.  Place Patient Logbook in the blue and white box. Use locking tab on box and tape box closed  securely. The blue and white box has prepaid postage on it. Please place it in the mailbox as  soon as possible. Your physician should have your test results approximately 7 days after the  monitor has been mailed back to [facility].  Call [facility] Customer Care at [phone] if you have questions regarding  your ZIO XT patch monitor. Call them immediately if you see an orange light blinking on your  monitor.  If your monitor falls off in  less than 4 days, contact our Monitor department at [phone].  If your monitor becomes loose or falls off after 4 days call [employer] at [phone] for  suggestions on securing your monitor   Follow-Up: At [facility], you and your health needs are our priority.  As part of our continuing mission to provide you with exceptional heart care, our providers are all part of one team.  This team includes your primary Cardiologist (physician) and Advanced Practice Providers or APPs (Physician Assistants  and Nurse Practitioners) who all work together to provide you with the care you need, when you need it.  Your next appointment:   2 MONTHS  Provider:   MADISON FOUNTAIN, DNP

## 2023-11-29 NOTE — Progress Notes (Signed)
 " Cardiology Office Note:    Date:  11/29/2023  ID:  Omar Benson, DOB December 31, 1934, MRN [id number] PCP: Omar Lynwood ORN, MD  [facility] Providers Cardiologist:  Vinie JAYSON Maxcy, MD       Patient Profile:       Chief Complaint: 89-month follow-up History of Present Illness:  Omar Benson is a 89 y.o. male with visit-pertinent history of CAD s/p CABG, CKD stage III, atrial flutter s/p ablation, atrial fibrillation, hypertension, COPD.  He had a cardiac catheterization February 2017 which showed severe three-vessel occlusive CAD, patent LIMA to LAD, patent SVG to first diagonal, patent SVG to RCA and severely diseased SVG to OM unchanged when compared to 2013 which is not amendable to PCI.  Medical therapy was recommended at that time.  He was admitted for unstable angina on 12/2016.  Troponin did trend up to 0.9.  However he was felt not to be a good interventional candidate due to not operable graft disease.  Echocardiogram obtained on 12/2016 showed LVEF 55 to 60%, grade 1 DD.  Few days after discharge she returned to the hospital on 12/29/2016.  He had acute on chronic renal insufficiency with creatinine of 2.1.  He ultimately underwent cardiac catheterization on 01/30/2017 which showed 100% proximal RCA, 100 stent ostial LAD, patent LIMA to LAD, 100% proximal left circumflex was severely diseased proximal and distal SVG to OM, 75% SVG to diagonal, proximal part of SVG to PDA/PLA were patent, however unable to visualize the entire graft.  He returned to the Cath Lab on the following day and underwent drug-eluting stent to distal RCA prior to PLA takeoff.  He also underwent successful PCI with drug-eluting stent SVG to diagonal.  AAA ultrasound obtained on 10/21 showed largest aorta measuring 3.4 cm, greater than 50% stenosis in right common iliac artery.  He was last seen in office on 12/18/2022 by Dr. Maxcy.  He was doing well without exertional symptoms.  He was to follow-up in 1 year.  He  underwent echocardiogram on 11/25/2023 showing LVEF 55 to 60%, no RWMA, mild LVH of basal septal segment, RV function and size normal, normal PASP, trivial mitral valve regurgitation.   Discussed the use of AI scribe software for clinical note transcription with the patient, who gave verbal consent to proceed.  History of Present Illness Omar Benson is an 89 year old male with coronary artery disease and COPD who presents for 1 year follow-up with acute complaints of fatigue and shortness of breath.  Over the past couple of weeks, he experiences fatigue after ten to fifteen minutes of activity, a change from his usual activity level. He describes daily sensations of 'spasms'.  He reports may have felt like PVCs he has in the past.  These symptoms are new and distinct from previous cardiac issues.  He denies any anginal symptoms.  He will occasionally have pinpoint chest pain which has been described as musculoskeletal in the past that is chronic and unchanged.  He denies chest, jaw, or arm pain as well as prior anginal equivalent.  He does have shortness of breath that has been ongoing and chronic.  Denies any orthopnea or PND.  No leg swelling.  He has a history of atrial fibrillation and flutter, with a past ablation. He is not aware of current atrial fibrillation episodes but is conscious of his heartbeat, a sensation present since his teenage years.  He denies any syncope, presyncope, lightheadedness, dizziness.   Review of systems:  Please see the history of present illness. All other systems are reviewed and otherwise negative.      Studies Reviewed:    EKG Interpretation Date/Time:  Monday November 29 2023 15:36:19 EDT Ventricular Rate:  49 PR Interval:  216 QRS Duration:  148 QT Interval:  488 QTC Calculation: 440 R Axis:   -44  Text Interpretation: Sinus bradycardia with 1st degree A-V block Left axis deviation Right bundle branch block T wave abnormality, consider lateral  ischemia When compared with ECG of 15-Dec-2022 13:30, No significant change was found Confirmed by Omar Benson [phone]) on 11/29/2023 4:26:47 PM    Echocardiogram 11/25/2023 1. Left ventricular ejection fraction, by estimation, is 55 to 60%. Left  ventricular ejection fraction by 3D volume is 56 %. The left ventricle has  normal function. The left ventricle has no regional wall motion  abnormalities. There is mild concentric  left ventricular hypertrophy of the basal-septal segment. Left ventricular  diastolic function could not be evaluated.   2. Right ventricular systolic function is normal. The right ventricular  size is normal. There is normal pulmonary artery systolic pressure.   3. The mitral valve is normal in structure. Trivial mitral valve  regurgitation. No evidence of mitral stenosis.   4. The aortic valve is normal in structure. Aortic valve regurgitation is  not visualized. No aortic stenosis is present.   5. The inferior vena cava is normal in size with greater than 50%  respiratory variability, suggesting right atrial pressure of 3 mmHg.   Cardiac catheterization 12/31/2016 Prox RCA to Dist RCA lesion, 100 %stenosed. Ost LAD to Prox LAD lesion, 100 %stenosed. Ost Ramus to Ramus lesion, 30 %stenosed. Prox Cx to Mid Cx lesion, 100 %stenosed. Origin to Prox Graft lesion, 95 %stenosed. Mid Graft to Dist Graft lesion, 75 %stenosed. And is normal in caliber. LIMA and is normal in caliber. A STENT RESOLUTE ONYX 2.5X22 drug eluting stent was successfully placed. 1st Diag lesion, 90 %stenosed. Post intervention, there is a 0% residual stenosis. A STENT RESOLUTE ONYX [id number] drug eluting stent was successfully placed. Post Atrio lesion, 95 %stenosed. Post intervention, there is a 0% residual stenosis.   Selective angiography into the SVG which supplied the RCA revealed that this was a sequential graft anastomosing into the distal RCA continuation branch and the mid PDA.   There was a 95% focal stenosis in the distal RCA continuation branch prior to the PLA takeoff.   Successful PCI to the distal RCA through the saphenous vein graft of the 95% continuation branch stenosis with  insertion of a 2.7518 mm Resolute Onyx DES stent postdilated to 2.8 mm with the 95% stenosis being reduced to 0%.   Successful PCI through the SVG supplying the diagonal (ramus intermediate, like) vessel with ultimate insertion of a 2.522 mm stent deployed at the distal tip of the graft extending into the diagonal vessel with the 90% stenoses being reduced to 0% with post stent dilatation up to 2.7 mm.   RECOMMENDATION: The patient should continue with dual antiplatelet therapy indefinitely, particularly with his high-grade stenosis in the graft supplying the circumflex.  Medical therapy for concomitant CAD.  High potency statin therapy. Diagnostic Dominance: Right  Intervention   Risk Assessment/Calculations:    CHA2DS2-VASc Score =     This indicates a  % annual risk of stroke. The patient's score is based upon:              Physical Exam:   VS:  BP 118/60 (BP  Location: Left Arm, Patient Position: Sitting, Cuff Size: Normal)   Pulse (!) 52   Ht 5' 9 (1.753 m)   Wt 177 lb (80.3 kg)   BMI 26.14 kg/m    Wt Readings from Last 3 Encounters:  11/29/23 177 lb (80.3 kg)  11/23/23 175 lb 12.8 oz (79.7 kg)  08/23/23 178 lb (80.7 kg)    GEN: Well nourished, well developed in no acute distress NECK: No JVD; No carotid bruits CARDIAC: RRR, no murmurs, rubs, gallops RESPIRATORY:  Clear to auscultation without rales, wheezing or rhonchi  ABDOMEN: Soft, non-tender, non-distended EXTREMITIES:  No edema; No acute deformity      Assessment and Plan:  Atrial flutter Patient has history of atrial flutter ablation in 2004 He has had no recurrences since ablation so he has been off of anticoagulation EKG today shows patient is maintaining NSR Patient does report new onset fatigue  and sensation of spasms over the past several weeks.  Possibly described as intermittent palpitations.  No dizziness or syncope Interestingly his echocardiogram on 11/25/2023 had reported left ventricular diastolic dysfunction could not be evaluated due to atrial fibrillation - Currently on amiodarone  with unremarkable LFTs and TSH on 11/2023 - Plan for 7-day ZIO monitor for reoccurrence of atrial fibrillation - Continue amiodarone  200 mg daily and aspirin  81 mg daily  Coronary artery disease S/p CABG x 4 in 2004 Echocardiogram 11/2023 with LVEF 55 to 60%, no RWMA High-sensitivity troponin 11/2023 was unremarkable EKG today shows no acute ischemic changes when compared to prior EKG in 11/2022 - Today he reports occasional chronic pinpoint chest pains that has been ongoing for several years which is unchanged and is thought to be consistent with musculoskeletal pain.  He denies any anginal symptoms on exertion/physical activity.  No positional or pleuritic chest pains.  He denies any jaw, back, arm pains.  He further denies prior anginal equivalent.  He does report some ongoing fatigue which we will monitor.  There is no indication for further ischemic evaluation at this time - Continue aspirin  81 mg daily, isosorbide  60 mg daily, and nitroglycerin  as needed  CKD stage III Creatinine 1.86 and GFR 32 on 11/2023 - Kidney function remains stable.  Continue to follow nephrology  COPD No recent or acute exacerbation  Hyperlipidemia LDL 57, HDL 41, TG 261 on 06/7972 He is intolerant to statin therapy and has deferred injectable medications in the past - Continue ezetimibe  10 mg daily      Dispo:  Return in about 2 months (around 01/30/2024).  Signed, Omar LITTIE Louis, NP  "

## 2023-11-30 ENCOUNTER — Ambulatory Visit: Attending: Emergency Medicine

## 2023-11-30 ENCOUNTER — Encounter: Payer: Self-pay | Admitting: Emergency Medicine

## 2023-11-30 DIAGNOSIS — R5383 Other fatigue: Secondary | ICD-10-CM

## 2023-11-30 DIAGNOSIS — R002 Palpitations: Secondary | ICD-10-CM

## 2023-11-30 NOTE — Progress Notes (Unsigned)
 Enrolled for [employer] to mail a ZIO XT long term holter monitor to the patients address on file.   Dr. Rennis Golden to read.

## 2023-12-20 ENCOUNTER — Encounter: Payer: Self-pay | Admitting: Internal Medicine

## 2023-12-21 ENCOUNTER — Ambulatory Visit: Payer: Self-pay | Admitting: Cardiology

## 2023-12-21 DIAGNOSIS — R5383 Other fatigue: Secondary | ICD-10-CM

## 2023-12-21 DIAGNOSIS — R002 Palpitations: Secondary | ICD-10-CM

## 2023-12-21 MED ORDER — NEFFY 1 MG/0.1ML NA SOLN: 1.0000 | Freq: Every day | NASAL | 2 refills | Status: DC | PRN

## 2023-12-21 NOTE — Telephone Encounter (Signed)
 Pt returning call to a nurse

## 2023-12-22 ENCOUNTER — Telehealth: Payer: Self-pay

## 2023-12-22 ENCOUNTER — Other Ambulatory Visit (HOSPITAL_COMMUNITY): Payer: Self-pay

## 2023-12-22 NOTE — Telephone Encounter (Signed)
 Pharmacy Patient Advocate Encounter   Received notification from Onbase that prior authorization for Neffy  1 is required/requested.   Insurance verification completed.   The patient is insured through [facility] [employer]/RX ADVANCE .   Per test claim: PA required; PA submitted to above mentioned insurance via CoverMyMeds Key/confirmation #/EOC [id number] Status is pending

## 2023-12-23 ENCOUNTER — Other Ambulatory Visit (HOSPITAL_COMMUNITY): Payer: Self-pay

## 2023-12-23 NOTE — Telephone Encounter (Signed)
 Ok this is noted, no new orders

## 2023-12-23 NOTE — Telephone Encounter (Signed)
 There was no chart notes or ICD 10 codes on the rx or in patient file. Request for medication was in patient messages.  Pharmacy Patient Advocate Encounter  Received notification from [employer] that Prior Authorization for Neffy  1 has been DENIED.  See denial reason below. No denial letter attached in CMM. Will attach denial letter to Media tab once received.   PA #/Case ID/Reference #: [id number]

## 2024-01-25 ENCOUNTER — Other Ambulatory Visit: Payer: Self-pay | Admitting: Physician Assistant

## 2024-01-25 DIAGNOSIS — I480 Paroxysmal atrial fibrillation: Secondary | ICD-10-CM

## 2024-01-31 ENCOUNTER — Encounter: Payer: Self-pay | Admitting: *Deleted

## 2024-02-02 ENCOUNTER — Ambulatory Visit: Attending: Emergency Medicine | Admitting: Emergency Medicine

## 2024-02-02 ENCOUNTER — Encounter: Payer: Self-pay | Admitting: Emergency Medicine

## 2024-02-02 VITALS — BP 112/62 | HR 53 | Ht 69.0 in | Wt 176.0 lb

## 2024-02-02 DIAGNOSIS — I10 Essential (primary) hypertension: Secondary | ICD-10-CM

## 2024-02-02 DIAGNOSIS — I480 Paroxysmal atrial fibrillation: Secondary | ICD-10-CM | POA: Diagnosis not present

## 2024-02-02 DIAGNOSIS — I25810 Atherosclerosis of coronary artery bypass graft(s) without angina pectoris: Secondary | ICD-10-CM

## 2024-02-02 DIAGNOSIS — E785 Hyperlipidemia, unspecified: Secondary | ICD-10-CM

## 2024-02-02 DIAGNOSIS — N1832 Chronic kidney disease, stage 3b: Secondary | ICD-10-CM

## 2024-02-02 DIAGNOSIS — I452 Bifascicular block: Secondary | ICD-10-CM

## 2024-02-02 DIAGNOSIS — J449 Chronic obstructive pulmonary disease, unspecified: Secondary | ICD-10-CM

## 2024-02-02 DIAGNOSIS — I493 Ventricular premature depolarization: Secondary | ICD-10-CM

## 2024-02-02 NOTE — Progress Notes (Signed)
 Cardiology Office Note:    Date:  02/02/2024  ID:  Omar Benson, DOB 1934-06-04, MRN [id number] PCP: Omar Benson ORN, MD  [facility] Providers Cardiologist:  Omar JAYSON Maxcy, MD       Patient Profile:       Chief Complaint: 77-month follow-up History of Present Illness:  Omar Benson is a 89 y.o. male with visit-pertinent history of CAD s/p CABG, CKD stage III, atrial flutter s/p ablation, atrial fibrillation, hypertension, COPD.   He had a cardiac catheterization February 2017 which showed severe three-vessel occlusive CAD, patent LIMA to LAD, patent SVG to first diagonal, patent SVG to RCA and severely diseased SVG to OM unchanged when compared to 2013 which is not amendable to PCI.  Medical therapy was recommended at that time.  He was admitted for unstable angina on 12/2016.  Troponin did trend up to 0.9.  However he was felt not to be a good interventional candidate due to not operable graft disease.  Echocardiogram obtained on 12/2016 showed LVEF 55 to 60%, grade 1 DD.  Few days after discharge she returned to the hospital on 12/29/2016.  He had acute on chronic renal insufficiency with creatinine of 2.1.  He ultimately underwent cardiac catheterization on 01/30/2017 which showed 100% proximal RCA, 100 stent ostial LAD, patent LIMA to LAD, 100% proximal left circumflex was severely diseased proximal and distal SVG to OM, 75% SVG to diagonal, proximal part of SVG to PDA/PLA were patent, however unable to visualize the entire graft.  He returned to the Cath Lab on the following day and underwent drug-eluting stent to distal RCA prior to PLA takeoff.  He also underwent successful PCI with drug-eluting stent SVG to diagonal.  AAA ultrasound obtained on 10/21 showed largest aorta measuring 3.4 cm, greater than 50% stenosis in right common iliac artery.   Seen in office on 12/18/2022 by Dr. Maxcy.  He was doing well without exertional symptoms.  He was to follow-up in 1 year.   He underwent  echocardiogram on 11/25/2023 showing LVEF 55 to 60%, no RWMA, mild LVH of basal septal segment, RV function and size normal, normal PASP, trivial mitral valve regurgitation.  He was last seen in clinic on 11/29/2023.  He had an acute complaints of fatigue and shortness of breath over the past couple weeks.  Reported after 10 to 15 minutes of activity he would become fatigued to change from his usual activity level.  He describes daily sensations of spasms he reports may have felt like PVCs he is had in the past.  ZIO monitor was ordered showing minimum heart rate 41 bpm, max HR 70 bpm, average HR 50 bpm.  First-degree AV block was present.  Bundle branch block/IVCD was present.  Low burden of PACs and PVCs.  No atrial fibrillation, pauses or heart block.  Patient triggered events correlated with PACs and PVCs as well as normal sinus rhythm.   Discussed the use of AI scribe software for clinical note transcription with the patient, who gave verbal consent to proceed.  History of Present Illness Omar Benson is an 89 year old male with coronary artery disease and atrial flutter who presents for follow-up of his cardiac conditions.  Today patient presents to clinic and is without any new cardiac concerns or complaints today.  Reports no changes to his fatigue.  No longer having sensations of spasms in his chest.  Does occasionally feel his PACs and PVCs with no acute changes in duration or  frequency.  He denies any anginal symptoms today.  He has been without any chest pains, dyspnea, orthopnea, or PND.  He denies any jaw pain which was his prior anginal equivalent.  He has been without any leg swelling.  He denies any symptoms concerning for recurrent atrial fibrillation.  He denies any syncope, presyncope, lightheadedness or dizziness.   Review of systems:  Please see the history of present illness. All other systems are reviewed and otherwise negative.      Studies Reviewed:    EKG  Interpretation Date/Time:  Wednesday February 02 2024 15:31:23 EDT Ventricular Rate:  53 PR Interval:    QRS Duration:  152 QT Interval:  490 QTC Calculation: 459 R Axis:   -51  Text Interpretation: Sinus bradycardia with 1st degree A-V block Right bundle branch block Left anterior fascicular block Bifascicular block T wave abnormality, consider lateral ischemia When compared with ECG of 29-Nov-2023 15:36, No significant change since last tracing Confirmed by Rana Dixon [phone]) on 02/02/2024 4:23:05 PM    Zio 11/30/2022 Patch Wear Time:  7 days and 22 hours (2025-07-21T22:16:07-398 to 2025-07-29T21:02:50-0400)   Patient had a min HR of 41 bpm, max HR of 78 bpm, and avg HR of 50 bpm. Predominant underlying rhythm was Sinus Rhythm. First Degree AV Block was present. Bundle Branch Block/IVCD was present. Isolated SVEs were rare (<1.0%), SVE Couplets were rare  (<1.0%), and no SVE Triplets were present. Isolated VEs were rare (<1.0%), and no VE Couplets or VE Triplets were present. Ventricular Trigeminy was present.   Sinus rhythm with 1st degree AVB. Average HR 50 with low of 41. Low burden of PAC's and PVC's. No afib, pauses or heart block. Patient triggered events correlated with PAC's and PVC's as well as normal sinus rhythm.  Echocardiogram 11/25/2023 1. Left ventricular ejection fraction, by estimation, is 55 to 60%. Left  ventricular ejection fraction by 3D volume is 56 %. The left ventricle has  normal function. The left ventricle has no regional wall motion  abnormalities. There is mild concentric  left ventricular hypertrophy of the basal-septal segment. Left ventricular  diastolic function could not be evaluated.   2. Right ventricular systolic function is normal. The right ventricular  size is normal. There is normal pulmonary artery systolic pressure.   3. The mitral valve is normal in structure. Trivial mitral valve  regurgitation. No evidence of mitral stenosis.   4.  The aortic valve is normal in structure. Aortic valve regurgitation is  not visualized. No aortic stenosis is present.   5. The inferior vena cava is normal in size with greater than 50%  respiratory variability, suggesting right atrial pressure of 3 mmHg.    Cardiac catheterization 12/31/2016 Prox RCA to Dist RCA lesion, 100 %stenosed. Ost LAD to Prox LAD lesion, 100 %stenosed. Ost Ramus to Ramus lesion, 30 %stenosed. Prox Cx to Mid Cx lesion, 100 %stenosed. Origin to Prox Graft lesion, 95 %stenosed. Mid Graft to Dist Graft lesion, 75 %stenosed. And is normal in caliber. LIMA and is normal in caliber. A STENT RESOLUTE ONYX 2.5X22 drug eluting stent was successfully placed. 1st Diag lesion, 90 %stenosed. Post intervention, there is a 0% residual stenosis. A STENT RESOLUTE ONYX [id number] drug eluting stent was successfully placed. Post Atrio lesion, 95 %stenosed. Post intervention, there is a 0% residual stenosis.   Selective angiography into the SVG which supplied the RCA revealed that this was a sequential graft anastomosing into the distal RCA continuation branch and the mid PDA.  There was a 95% focal stenosis in the distal RCA continuation branch prior to the PLA takeoff.   Successful PCI to the distal RCA through the saphenous vein graft of the 95% continuation branch stenosis with  insertion of a 2.7518 mm Resolute Onyx DES stent postdilated to 2.8 mm with the 95% stenosis being reduced to 0%.   Successful PCI through the SVG supplying the diagonal (ramus intermediate, like) vessel with ultimate insertion of a 2.522 mm stent deployed at the distal tip of the graft extending into the diagonal vessel with the 90% stenoses being reduced to 0% with post stent dilatation up to 2.7 mm.   RECOMMENDATION: The patient should continue with dual antiplatelet therapy indefinitely, particularly with his high-grade stenosis in the graft supplying the circumflex.  Medical therapy for concomitant  CAD.  High potency statin therapy. Diagnostic Dominance: Right  Intervention    Risk Assessment/Calculations:    CHA2DS2-VASc Score =     This indicates a  % annual risk of stroke. The patient's score is based upon:              Physical Exam:   VS:  BP 112/62 (BP Location: Left Arm, Patient Position: Sitting, Cuff Size: Normal)   Pulse (!) 53   Ht 5' 9 (1.753 m)   Wt 176 lb (79.8 kg)   BMI 25.99 kg/m    Wt Readings from Last 3 Encounters:  02/02/24 176 lb (79.8 kg)  11/29/23 177 lb (80.3 kg)  11/23/23 175 lb 12.8 oz (79.7 kg)    GEN: Well nourished, well developed in no acute distress NECK: No JVD; No carotid bruits CARDIAC: RRR, no murmurs, rubs, gallops RESPIRATORY:  Clear to auscultation without rales, wheezing or rhonchi  ABDOMEN: Soft, non-tender, non-distended EXTREMITIES:  No edema; No acute deformity      Assessment and Plan:  Atrial flutter Atrial fibrillation Patient has history of atrial flutter ablation in 2004 He has had no recurrences since ablation so he has been off of anticoagulation ZIO 12/2023 showed no atrial fibrillation - EKG today shows patient is maintaining NSR - Reports frequency/duration of palpitations have improved.  Per most recent heart monitor his events were correlated with PACs/PVCs as well as NSR - Currently on amiodarone  with unremarkable LFTs and TSH on 11/2023 - Continue amiodarone  200 mg daily and aspirin  81 mg daily   Coronary artery disease S/p CABG x 4 in 2004 Echocardiogram 11/2023 with LVEF 55 to 60%, no RWMA EKG today shows no acute ischemic changes - Denies anginal symptoms at this time.  Stays mildly active without exertional symptoms.  No positional chest pains.  Denies prior anginal equivalent.  He continues to report some ongoing fatigue which we will monitor.  There is no indication for further ischemic evaluation at this time - Continue aspirin  81 mg daily, isosorbide  60 mg daily, and nitroglycerin  as needed    CKD stage III Creatinine 1.86 and GFR 32 on 11/2023 - Kidney function remains stable.  Continue to follow nephrology   COPD No recent or acute exacerbation   Hyperlipidemia, LDL goal <70 LDL 57, HDL 41, TG 261 on 06/7972 He is intolerant to statin therapy and has deferred injectable medications in the past - Continue ezetimibe  10 mg daily  Bifascicular block History of RBBB and LAFB ZIO 11/2023 with first-degree AV block/BBB/IVCD with average HR 70 bpm - Today he is without lightheadedness, dizziness, dyspnea, chest pains, or other exertional symptoms.  Does report ongoing fatigue - We will  continue to monitor.  Avoid AV nodal blocking agents  PACs PVCs ZIO 11/2023 showed triggered events correlated with PACs and PVCs as well as normal sinus rhythm.  With rare PAC and PVCs - Well-controlled.  He notes improvement in duration and frequency of palpitations - No AV nodal blocking agents given history of bifascicular block      Dispo:  Return in about 3 months (around 05/03/2024).  Signed, Lum LITTIE Louis, NP

## 2024-02-02 NOTE — Patient Instructions (Signed)
 Medication Instructions:  NO CHANGES  Lab Work: NONE TO BE DONE TODAY.  Testing/Procedures: NONE  Follow-Up: At [facility], you and your health needs are our priority.  As part of our continuing mission to provide you with exceptional heart care, our providers are all part of one team.  This team includes your primary Cardiologist (physician) and Advanced Practice Providers or APPs (Physician Assistants and Nurse Practitioners) who all work together to provide you with the care you need, when you need it.  Your next appointment:   3 MONTHS  Provider:   Vinie JAYSON Maxcy, MD ONLY

## 2024-03-08 ENCOUNTER — Emergency Department (HOSPITAL_COMMUNITY)

## 2024-03-08 ENCOUNTER — Encounter (HOSPITAL_COMMUNITY): Payer: Self-pay

## 2024-03-08 ENCOUNTER — Emergency Department (HOSPITAL_COMMUNITY)
Admission: EM | Admit: 2024-03-08 | Discharge: 2024-03-08 | Disposition: A | Discharge status: Home / Self Care | Attending: Emergency Medicine | Admitting: Emergency Medicine

## 2024-03-08 ENCOUNTER — Other Ambulatory Visit: Payer: Self-pay

## 2024-03-08 DIAGNOSIS — K649 Unspecified hemorrhoids: Secondary | ICD-10-CM | POA: Diagnosis not present

## 2024-03-08 DIAGNOSIS — D649 Anemia, unspecified: Secondary | ICD-10-CM | POA: Diagnosis not present

## 2024-03-08 DIAGNOSIS — R0789 Other chest pain: Secondary | ICD-10-CM | POA: Insufficient documentation

## 2024-03-08 DIAGNOSIS — Z7982 Long term (current) use of aspirin: Secondary | ICD-10-CM | POA: Insufficient documentation

## 2024-03-08 DIAGNOSIS — R079 Chest pain, unspecified: Secondary | ICD-10-CM | POA: Diagnosis present

## 2024-03-08 LAB — POC OCCULT BLOOD, ED: Fecal Occult Bld: NEGATIVE

## 2024-03-08 LAB — CBC
HCT: 22.1 % — ABNORMAL LOW (ref 39.0–52.0)
Hemoglobin: 7.3 g/dL — ABNORMAL LOW (ref 13.0–17.0)
MCH: 32.7 pg (ref 26.0–34.0)
MCHC: 33 g/dL (ref 30.0–36.0)
MCV: 99.1 fL (ref 80.0–100.0)
Platelets: 105 K/uL — ABNORMAL LOW (ref 150–400)
RBC: 2.23 MIL/uL — ABNORMAL LOW (ref 4.22–5.81)
RDW: 13.4 % (ref 11.5–15.5)
WBC: 3.1 K/uL — ABNORMAL LOW (ref 4.0–10.5)
nRBC: 0 % (ref 0.0–0.2)

## 2024-03-08 LAB — TROPONIN I (HIGH SENSITIVITY)
Troponin I (High Sensitivity): 10 ng/L (ref <18)
Troponin I (High Sensitivity): 11 ng/L

## 2024-03-08 LAB — BASIC METABOLIC PANEL WITH GFR
Anion gap: 7 (ref 5–15)
BUN: 25 mg/dL — ABNORMAL HIGH (ref 8–23)
CO2: 21 mmol/L — ABNORMAL LOW (ref 22–32)
Calcium: 8.7 mg/dL — ABNORMAL LOW (ref 8.9–10.3)
Chloride: 107 mmol/L (ref 98–111)
Creatinine, Ser: 1.66 mg/dL — ABNORMAL HIGH (ref 0.61–1.24)
GFR, Estimated: 40 mL/min — ABNORMAL LOW (ref >60)
Glucose, Bld: 104 mg/dL — ABNORMAL HIGH (ref 70–99)
Potassium: 4 mmol/L (ref 3.5–5.1)
Sodium: 135 mmol/L (ref 135–145)

## 2024-03-08 LAB — PREPARE RBC (CROSSMATCH)

## 2024-03-08 MED ORDER — SODIUM CHLORIDE 0.9% IV SOLUTION: Freq: Once | INTRAVENOUS | Status: AC

## 2024-03-08 MED ORDER — ACETAMINOPHEN 325 MG PO TABS
650.0000 mg | ORAL_TABLET | Freq: Once | ORAL | Status: AC
  Administered 2024-03-08: 650 mg via ORAL
  Filled 2024-03-08: qty 2

## 2024-03-08 MED ORDER — FUROSEMIDE 10 MG/ML IJ SOLN
20.0000 mg | Freq: Once | INTRAMUSCULAR | Status: AC
  Administered 2024-03-08: 20 mg via INTRAVENOUS
  Filled 2024-03-08: qty 2

## 2024-03-08 NOTE — ED Provider Triage Note (Signed)
 Emergency Medicine Provider Triage Evaluation Note  [name] , a 89 y.o. male  was evaluated in triage.  Pt complains of chest soreness.  This is not unusual for him.  He states this started yesterday.  Worse with palpation.  Not exertional.  He has had multiple bypass surgeries in the past.  He states he had no chest pain with any of those episodes.  Typically his anginal symptom is pain in his neck.  He is not having pain in his neck, no shortness of breath, pain is not radiating anywhere.  Review of Systems  Positive: As above Negative: As above  Physical Exam  BP (!) 168/80 (BP Location: Right Arm)   Pulse (!) 51   Temp 97.7 F (36.5 C) (Oral)   Resp 15   Ht 5' 9 (1.753 m)   Wt 79.4 kg   SpO2 98%   BMI 25.84 kg/m  Gen:   Awake, no distress   Resp:  Normal effort  MSK:   Moves extremities without difficulty  Other:  Reproducible chest pain bilaterally.  Medical Decision Making  Medically screening exam initiated at 1:14 PM.  Appropriate orders placed.  [name] was informed that the remainder of the evaluation will be completed by another provider, this initial triage assessment does not replace that evaluation, and the importance of remaining in the ED until their evaluation is complete.  Advised patient should be made an acuity 2.   Hildegard Loge, PA-C 03/08/24 1315

## 2024-03-08 NOTE — ED Notes (Signed)
 Consent signed.

## 2024-03-08 NOTE — Discharge Instructions (Addendum)
 With your anemia it is important to follow-up with your physician.  Please call for the next available appointment.  Return here for concerning changes in your condition.

## 2024-03-08 NOTE — ED Notes (Signed)
 Patient transported to X-ray

## 2024-03-08 NOTE — ED Provider Notes (Signed)
 Signout from Dr. Garrick.  89 year old male with chest tightness and fatigue.  Found to have a low hemoglobin.  Plan is to transfuse 2 units of packed red blood cells.  Hemoccult negative no evidence of active bleeding so if feeling better can be safely discharged to follow-up with his outpatient treatment team. Physical Exam  BP (!) 153/66   Pulse (!) 48   Temp 97.6 F (36.4 C) (Oral)   Resp 18   Ht 5' 9 (1.753 m)   Wt 79.4 kg   SpO2 98%   BMI 25.84 kg/m   Physical Exam  Procedures  Procedures  ED Course / MDM    Medical Decision Making Amount and/or Complexity of Data Reviewed Labs: ordered. Radiology: ordered.  Risk OTC drugs. Prescription drug management.   7:30 PM.  I evaluated patient after his first unit and he is laying mostly flat very comfortable without any shortness of breath.  Lungs are clear to auscultation bilaterally.  Second unit transfusing.  8:30 PM.  During second unit patient is starting to feel little bit of fluid in his lungs.  Have ordered him some Lasix.  Will continue to monitor  10 PM.  Patient finished second unit and satting well and does not feel short of breath.  He is comfortable plan for discharge and outpatient follow-up with his PCP.  Return instructions discussed   Omar Ozell BROCKS, MD 03/08/24 2200

## 2024-03-08 NOTE — ED Triage Notes (Signed)
 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 Does still have pain rated 1/10 upon arrival but can still be worsened with palpation. Does take Amio for Hx of A-Fib, EMS reports his EKG was unremarkable & did have some snaps showing A-Fib & other ones showing NSR, was regular. A/Ox4 164/80 50 bpm  97% on RA 107 CBG 18g Lt AC PIV

## 2024-03-08 NOTE — ED Provider Notes (Signed)
  EMERGENCY DEPARTMENT AT [facility] Provider Note   CSN: [id number] Arrival date & time: 03/08/24  1236     Patient presents with: No chief complaint on file.   Omar Benson is a 89 y.o. male.   HPI Patient presents with his wife who assists with the history.  He presents with concern of chest tightness.  He notes that he has had some of this before, but now it is atypical and that is more severe, bilateral whereas it is typically mild, unilateral.  He has a history of cardiac disease with 2 prior episodes of requiring stent. He is not currently anticoagulated but is taking his aspirin  regularly.     Prior to Admission medications   Medication Sig Start Date End Date Taking? Authorizing Provider  allopurinol  (ZYLOPRIM ) 300 MG tablet TAKE 1 TABLET(300 MG) BY MOUTH DAILY 11/16/23   Norleen Lynwood ORN, MD  amiodarone  (PACERONE ) 200 MG tablet TAKE 1/2 TABLET(100 MG) BY MOUTH DAILY 01/26/24   Rana Lum CROME, NP  aspirin  81 MG tablet Take 1 tablet (81 mg total) by mouth at bedtime. 01/01/17   Levora Riggs, PA-C  isosorbide  mononitrate (IMDUR ) 60 MG 24 hr tablet TAKE 1 TABLET(60 MG) BY MOUTH DAILY 11/16/23   Norleen Lynwood ORN, MD  levothyroxine  (SYNTHROID ) 125 MCG tablet TAKE 1 TABLET BY MOUTH DAILY 11/16/23   Norleen Lynwood ORN, MD  Multiple Vitamin (MULTIVITAMIN WITH MINERALS) TABS tablet Take 1 tablet by mouth daily.     [provider]  nitroGLYCERIN  (NITROSTAT ) 0.4 MG SL tablet PLACE 1 TABLET UNDER TONGUE EVERY 5 MINS AS NEEDED FOR CHEST PAIN 05/05/23   Mona Vinie BROCKS, MD  triamcinolone  (NASACORT ) 55 MCG/ACT AERO nasal inhaler Place 2 sprays into the nose daily. 07/09/23   Norleen Lynwood ORN, MD    Allergies: Fish allergy, Promethazine hcl, Shellfish allergy, Yellow jacket venom [bee venom], Doxycycline hyclate, Lipitor [atorvastatin  calcium ], Tetracycline, Castor oil, Omega-3 fatty acids, Crestor  [rosuvastatin ], and Tetanus toxoid    Review of Systems  Updated Vital  Signs BP (!) 147/75   Pulse (!) 53   Temp 97.7 F (36.5 C) (Oral)   Resp 14   Ht 1.753 m (5' 9)   Wt 79.4 kg   SpO2 98%   BMI 25.84 kg/m   Physical Exam Vitals and nursing note reviewed. Exam conducted with a chaperone present.  Constitutional:      General: He is not in acute distress.    Appearance: He is well-developed.  HENT:     Head: Normocephalic and atraumatic.  Eyes:     Conjunctiva/sclera: Conjunctivae normal.  Cardiovascular:     Rate and Rhythm: Normal rate and regular rhythm.  Pulmonary:     Effort: Pulmonary effort is normal. No respiratory distress.     Breath sounds: No stridor.  Abdominal:     General: There is no distension.  Genitourinary:    Comments: Small hemorrhoid, no active bleeding, no melena.  Hemoccult negative. Skin:    General: Skin is warm and dry.  Neurological:     Mental Status: He is alert and oriented to person, place, and time.     (all labs ordered are listed, but only abnormal results are displayed) Labs Reviewed  CBC - Abnormal; Notable for the following components:      Result Value   WBC 3.1 (*)    RBC 2.23 (*)    Hemoglobin 7.3 (*)    HCT 22.1 (*)    Platelets 105 (*)  All other components within normal limits  BASIC METABOLIC PANEL WITH GFR - Abnormal; Notable for the following components:   CO2 21 (*)    Glucose, Bld 104 (*)    BUN 25 (*)    Creatinine, Ser 1.66 (*)    Calcium  8.7 (*)    GFR, Estimated 40 (*)    All other components within normal limits  POC OCCULT BLOOD, ED  TYPE AND SCREEN  PREPARE RBC (CROSSMATCH)  TROPONIN I (HIGH SENSITIVITY)  TROPONIN I (HIGH SENSITIVITY)    EKG: None  Radiology: DG Chest 2 View Result Date: 03/08/2024 CLINICAL DATA:  Chest pain EXAM: CHEST - 2 VIEW COMPARISON:  11/23/2023 FINDINGS: Prior CABG. Heart and mediastinal contours are within normal limits. No focal opacities or effusions. No acute bony abnormality. IMPRESSION: No active cardiopulmonary disease.  Electronically Signed   By: Franky Crease M.D.   On: 03/08/2024 13:49     Procedures   Medications Ordered in the ED  0.9 %  sodium chloride  infusion (Manually program via Guardrails IV Fluids) (has no administration in time range)                                    Medical Decision Making Patient with a history of cardiac disease presents with chest pain, fatigue.  Patient is awake, alert, afebrile, breathing appropriately. Broad differential including ACS, pneumonia, electrolyte abnormalities.  Given his description of fatigue, anemia is a consideration.  Patient has a history of GI bleed, this may be recurrent. Cardiac 55 sinus normal pulse ox 98% room air normal  Amount and/or Complexity of Data Reviewed Independent Historian: spouse External Data Reviewed: notes. Labs: ordered. Decision-making details documented in ED Course. Radiology: ordered and independent interpretation performed. Decision-making details documented in ED Course. ECG/medicine tests: ordered and independent interpretation performed. Decision-making details documented in ED Course.  Risk Prescription drug management. Decision regarding hospitalization. Diagnosis or treatment significantly limited by social determinants of health.   3:43 PM Patient accompanied by family members and I discussed all findings, Hemoccult negative, initial troponin reassuring, creatinine at baseline, mild dysfunction. With family description of worsening fatigue recently, and history of GI bleed, though his Hemoccult is negative, patient's hemoglobin dropped from 13 now 7.2 is noted.  We discussed transfusions and the patient is amenable to this intervention.  With concern for symptomatic anemia likely contributing to some chest discomfort, patient will have blood transfusion, should these proceed without incident, patient will follow-up with primary care.  CRITICAL CARE Performed by: Lamar Salen Total critical care time: 35  minutes Critical care time was exclusive of separately billable procedures and treating other patients. Critical care was necessary to treat or prevent imminent or life-threatening deterioration. Critical care was time spent personally by me on the following activities: development of treatment plan with patient and/or surrogate as well as nursing, discussions with consultants, evaluation of patient's response to treatment, examination of patient, obtaining history from patient or surrogate, ordering and performing treatments and interventions, ordering and review of laboratory studies, ordering and review of radiographic studies, pulse oximetry and re-evaluation of patient's condition.  Final diagnoses:  Atypical chest pain  Symptomatic anemia    ED Discharge Orders     None          Salen Lamar, MD 03/08/24 [phone]

## 2024-03-09 ENCOUNTER — Ambulatory Visit: Payer: Self-pay

## 2024-03-09 LAB — TYPE AND SCREEN
ABO/RH(D): B POS
Antibody Screen: NEGATIVE
Unit division: 0
Unit division: 0

## 2024-03-09 LAB — BPAM RBC
Blood Product Expiration Date: 202511242359
Blood Product Expiration Date: 202511242359
ISSUE DATE / TIME: 202510221634
ISSUE DATE / TIME: 202510221855
Unit Type and Rh: 7300
Unit Type and Rh: 7300

## 2024-03-09 NOTE — Telephone Encounter (Signed)
 FYI Only or Action Required?: Action required by provider: request for appointment, clinical question for provider, and update on patient condition.  Patient was last seen in primary care on 11/23/2023 by Norleen Lynwood ORN, MD.  Called Nurse Triage reporting Chest Pain.  Symptoms began several days ago.  Interventions attempted: Other: patient went to the ER.  Symptoms are: unchanged.  Triage Disposition: Go to ED Now (or PCP Triage)  Patient/caregiver understands and will follow disposition?: No, wishes to speak with PCP                 Copied from CRM #[id number]. Topic: Clinical - Red Word Triage >> Mar 09, 2024 10:22 AM Omar Benson wrote: Red Word that prompted transfer to Nurse Triage: patient was discharged from the hospital last night and having spasms in his chest. Reason for Disposition  Patient sounds very sick or weak to the triager    Given his significant medical history and ER visit last night for same and the interventions performed in the ER  Answer Assessment - Initial Assessment Questions Patient states he is having chest pectoral pain --patient states sore to touch and it started when he woke up one night Patient wanted to be seen today before lunchtime  It is noted in the ED documentation within the chart: With concern for symptomatic anemia likely contributing to some chest discomfort, patient will have blood transfusion, should these proceed without incident, patient will follow-up with primary care.  With the patient stating that he is still having this chest discomfort this RN recommended the patient to return to the Emergency Room. Patient states he doesn't feel any worse than then and wanted to follow up with his PCP. This RN called [facility] to advise them that the patient didn't want to return to the Emergency Room due to him having this chest discomfort and the ER not addressing it as he states. They advised to send the CRM to the clinical pool and  this RN advised the patient that a message would be sent to his provider to advise further and if anything gets worse it is encouraged that he goes back to the Emergency Room Patient did verbalize understanding of that      1. LOCATION: Where does it hurt?       Both sides of chest 3. ONSET: When did the chest pain begin? (Minutes, hours or days)      Several days ago 4. PATTERN: Does the pain come and go, or has it been constant since it started?  Does it get worse with exertion?      Patient states constant 5. DURATION: How long does it last (e.g., seconds, minutes, hours)     Patient states constant---no different from when he went to the ER yesterday 6. SEVERITY: How bad is the pain?  (e.g., Scale 1-10; mild, moderate, or severe)     Patient states soreness 7. CARDIAC RISK FACTORS: Do you have any history of heart problems or risk factors for heart disease? (e.g., angina, prior heart attack; diabetes, high blood pressure, high cholesterol, smoker, or strong family history of heart disease)     significant 8. PULMONARY RISK FACTORS: Do you have any history of lung disease?  (e.g., blood clots in lung, asthma, emphysema, birth control pills)     --- 9. CAUSE: What do you think is causing the chest pain?     unknown 10. OTHER SYMPTOMS: Do you have any other symptoms? (e.g., dizziness, nausea, vomiting, sweating, fever,  difficulty breathing, cough)       Pt denies  Protocols used: Chest Pain-A-AH

## 2024-03-09 NOTE — Telephone Encounter (Signed)
 I agree with recommendation to go to ED now, but if he does not want to do this, perhaps he can be seen as outpatient  - any provider.   thanks

## 2024-03-13 NOTE — Telephone Encounter (Signed)
 Pt went to ED

## 2024-03-21 ENCOUNTER — Ambulatory Visit (INDEPENDENT_AMBULATORY_CARE_PROVIDER_SITE_OTHER): Admitting: Internal Medicine

## 2024-03-21 ENCOUNTER — Encounter: Payer: Self-pay | Admitting: Internal Medicine

## 2024-03-21 VITALS — BP 126/76 | HR 52 | Temp 98.2°F | Ht 69.0 in | Wt 174.0 lb

## 2024-03-21 DIAGNOSIS — D509 Iron deficiency anemia, unspecified: Secondary | ICD-10-CM

## 2024-03-21 DIAGNOSIS — D649 Anemia, unspecified: Secondary | ICD-10-CM | POA: Insufficient documentation

## 2024-03-21 DIAGNOSIS — I10 Essential (primary) hypertension: Secondary | ICD-10-CM

## 2024-03-21 DIAGNOSIS — R1084 Generalized abdominal pain: Secondary | ICD-10-CM | POA: Diagnosis not present

## 2024-03-21 DIAGNOSIS — R109 Unspecified abdominal pain: Secondary | ICD-10-CM | POA: Insufficient documentation

## 2024-03-21 MED ORDER — OMEPRAZOLE 40 MG PO CPDR: 40.0000 mg | DELAYED_RELEASE_CAPSULE | Freq: Every day | ORAL | 5 refills | Status: AC

## 2024-03-21 NOTE — Patient Instructions (Addendum)
 Please take all new medication as prescribed - the omeprazole 40 mg per day  Please continue all other medications as before, and refills have been done if requested.  Please have the pharmacy call with any other refills you may need.  Please keep your appointments with your specialists as you may have planned  You will be contacted regarding the referral for: CT scan, and GI - Dr Albertus  Please go to the LAB at the blood drawing area for the tests to be done  You will be contacted by phone if any changes need to be made immediately.  Otherwise, you will receive a letter about your results with an explanation, but please check with MyChart first.  Please make an Appointment to return in 3 months, or sooner if needed

## 2024-03-21 NOTE — Assessment & Plan Note (Signed)
 Afeb, non toxic appearing, for empiric PPI as above, CT abd pelvis, refer GI

## 2024-03-21 NOTE — Assessment & Plan Note (Signed)
 Much improved symptomatically after transfusion, no overt bleeding, not currently on xarelto  but taking asa daily; also with 5-6 wks abd pain unclear etiology  - for cbc, iron with labs, prilosec 40 mg every day empiric, CT abd pelvis and refer GI Dr Albertus.

## 2024-03-21 NOTE — Assessment & Plan Note (Signed)
 Also for f/u iron lab today, r/o recurrent deficiency
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
## 2024-03-21 NOTE — Assessment & Plan Note (Signed)
 BP Readings from Last 3 Encounters:  03/21/24 126/76  03/08/24 (!) 168/92  02/02/24 112/62   Stable, pt to continue medical treatment  - diet,wt control

## 2024-03-22 ENCOUNTER — Telehealth: Payer: Self-pay

## 2024-03-22 DIAGNOSIS — D649 Anemia, unspecified: Secondary | ICD-10-CM

## 2024-03-22 DIAGNOSIS — R1084 Generalized abdominal pain: Secondary | ICD-10-CM

## 2024-03-22 LAB — CBC WITH DIFFERENTIAL/PLATELET
Basophils Absolute: 0.1 K/uL (ref 0.0–0.1)
Basophils Relative: 1.1 % (ref 0.0–3.0)
Eosinophils Absolute: 0.3 K/uL (ref 0.0–0.7)
Eosinophils Relative: 5 % (ref 0.0–5.0)
HCT: 41.1 % (ref 39.0–52.0)
Hemoglobin: 13.9 g/dL (ref 13.0–17.0)
Lymphocytes Relative: 26.1 % (ref 12.0–46.0)
Lymphs Abs: 1.4 K/uL (ref 0.7–4.0)
MCHC: 33.8 g/dL (ref 30.0–36.0)
MCV: 95.2 fl (ref 78.0–100.0)
Monocytes Absolute: 0.6 K/uL (ref 0.1–1.0)
Monocytes Relative: 11.1 % (ref 3.0–12.0)
Neutro Abs: 3.1 K/uL (ref 1.4–7.7)
Neutrophils Relative %: 56.7 % (ref 43.0–77.0)
Platelets: 169 K/uL (ref 150.0–400.0)
RBC: 4.32 Mil/uL (ref 4.22–5.81)
RDW: 14.6 % (ref 11.5–15.5)
WBC: 5.4 K/uL (ref 4.0–10.5)

## 2024-03-22 LAB — BASIC METABOLIC PANEL WITH GFR
BUN: 29 mg/dL — ABNORMAL HIGH (ref 6–23)
CO2: 26 meq/L (ref 19–32)
Calcium: 9.7 mg/dL (ref 8.4–10.5)
Chloride: 105 meq/L (ref 96–112)
Creatinine, Ser: 1.82 mg/dL — ABNORMAL HIGH (ref 0.40–1.50)
GFR: 32.98 mL/min — ABNORMAL LOW (ref >60.00)
Glucose, Bld: 88 mg/dL (ref 70–99)
Potassium: 4.9 meq/L (ref 3.5–5.1)
Sodium: 138 meq/L (ref 135–145)

## 2024-03-22 LAB — HEPATIC FUNCTION PANEL
ALT: 24 U/L (ref 0–53)
AST: 32 U/L (ref 0–37)
Albumin: 4 g/dL (ref 3.5–5.2)
Alkaline Phosphatase: 95 U/L (ref 39–117)
Bilirubin, Direct: 0.2 mg/dL (ref 0.0–0.3)
Total Bilirubin: 0.8 mg/dL (ref 0.2–1.2)
Total Protein: 7.4 g/dL (ref 6.0–8.3)

## 2024-03-22 LAB — IBC PANEL
Iron: 71 ug/dL (ref 42–165)
Saturation Ratios: 24.1 % (ref 20.0–50.0)
TIBC: 294 ug/dL (ref 250.0–450.0)
Transferrin: 210 mg/dL — ABNORMAL LOW (ref 212.0–360.0)

## 2024-03-22 NOTE — Telephone Encounter (Signed)
 Copied from CRM [id number]. Topic: Referral - Question >> Mar 22, 2024 12:18 PM Eva FALCON wrote: Reason for CRM: Meta from [facility] is calling in, states they received the order for CT scan, states Dr. Norleen needs to choose either Ct abdomen pelvis with or without , can't do both. Please call with clarification. [phone] EXT [phone].

## 2024-03-22 NOTE — Telephone Encounter (Signed)
 Ok this was changed to ct abd pelvis w cm only - thanks

## 2024-03-23 ENCOUNTER — Ambulatory Visit: Payer: Self-pay | Admitting: Internal Medicine

## 2024-03-23 LAB — FERRITIN: Ferritin: 173.1 ng/mL (ref 22.0–322.0)

## 2024-03-23 LAB — VITAMIN B12: Vitamin B-12: 233 pg/mL (ref 211–911)

## 2024-03-23 NOTE — Progress Notes (Signed)
 The test results show that your current treatment is OK, as the tests are stable.  Please continue the same plan.  There is no other need for change of treatment or further evaluation based on these results, at this time.  thanks

## 2024-03-27 ENCOUNTER — Telehealth: Payer: Self-pay

## 2024-03-27 DIAGNOSIS — R1084 Generalized abdominal pain: Secondary | ICD-10-CM

## 2024-03-27 DIAGNOSIS — D649 Anemia, unspecified: Secondary | ICD-10-CM

## 2024-03-27 NOTE — Addendum Note (Signed)
 Addended by: NORLEEN LYNWOOD ORN on: 03/27/2024 12:30 PM   Modules accepted: Orders

## 2024-03-27 NOTE — Telephone Encounter (Signed)
 Ok this order was done

## 2024-03-27 NOTE — Telephone Encounter (Signed)
 Copied from CRM [id number]. Topic: Clinical - Request for Lab/Test Order >> Mar 27, 2024 10:08 AM Winona R wrote: Imaging has informed him he needed to reach out to the office for a new order for CT ABDOMEN PELVIS WO CONTRAST  as he can not drink the contrast due to it possibly effecting his kidneys. I >> Mar 27, 2024 10:12 AM Winona R wrote: Pt needs this by Wednesday as he is already on the schedule

## 2024-03-29 ENCOUNTER — Ambulatory Visit
Admission: RE | Admit: 2024-03-29 | Discharge: 2024-03-29 | Disposition: A | Discharge status: Home / Self Care | Source: Ambulatory Visit | Attending: Internal Medicine | Admitting: Internal Medicine

## 2024-03-29 DIAGNOSIS — D649 Anemia, unspecified: Secondary | ICD-10-CM

## 2024-03-29 DIAGNOSIS — R1084 Generalized abdominal pain: Secondary | ICD-10-CM

## 2024-03-30 ENCOUNTER — Ambulatory Visit: Payer: Self-pay | Admitting: Internal Medicine

## 2024-05-02 ENCOUNTER — Ambulatory Visit: Attending: Internal Medicine | Admitting: Internal Medicine

## 2024-05-02 ENCOUNTER — Encounter: Payer: Self-pay | Admitting: Internal Medicine

## 2024-05-02 VITALS — BP 126/65 | HR 52 | Ht 69.0 in | Wt 175.2 lb

## 2024-05-02 NOTE — Patient Instructions (Addendum)
 Medication Instructions:  NO CHANGES  *If you need a refill on your cardiac medications before your next appointment, please call your pharmacy*   Follow-Up: At [facility], you and your health needs are our priority.  As part of our continuing mission to provide you with exceptional heart care, our providers are all part of one team.  This team includes your primary Cardiologist (physician) and Advanced Practice Providers or APPs (Physician Assistants and Nurse Practitioners) who all work together to provide you with the care you need, when you need it.  Your next appointment:    6 months with Dr. Mona or Hao PA or [place] NP  We recommend signing up for the patient portal called MyChart.  Sign up information is provided on this After Visit Summary.  MyChart is used to connect with patients for Virtual Visits (Telemedicine).  Patients are able to view lab/test results, encounter notes, upcoming appointments, etc.  Non-urgent messages can be sent to your provider as well.   To learn more about what you can do with MyChart, go to [email or web address].
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
## 2024-05-03 ENCOUNTER — Encounter: Payer: Self-pay | Admitting: Internal Medicine

## 2024-05-03 ENCOUNTER — Ambulatory Visit: Admitting: Internal Medicine

## 2024-05-03 ENCOUNTER — Ambulatory Visit: Payer: Self-pay | Admitting: Internal Medicine

## 2024-05-03 ENCOUNTER — Other Ambulatory Visit: Payer: Self-pay | Admitting: Internal Medicine

## 2024-05-03 VITALS — BP 130/68 | HR 63 | Temp 97.9°F | Ht 69.0 in | Wt 176.0 lb

## 2024-05-03 DIAGNOSIS — R739 Hyperglycemia, unspecified: Secondary | ICD-10-CM | POA: Diagnosis not present

## 2024-05-03 DIAGNOSIS — J309 Allergic rhinitis, unspecified: Secondary | ICD-10-CM

## 2024-05-03 DIAGNOSIS — Z1231 Encounter for screening mammogram for malignant neoplasm of breast: Secondary | ICD-10-CM | POA: Diagnosis not present

## 2024-05-03 DIAGNOSIS — J449 Chronic obstructive pulmonary disease, unspecified: Secondary | ICD-10-CM | POA: Diagnosis not present

## 2024-05-03 DIAGNOSIS — E785 Hyperlipidemia, unspecified: Secondary | ICD-10-CM | POA: Diagnosis not present

## 2024-05-03 DIAGNOSIS — D509 Iron deficiency anemia, unspecified: Secondary | ICD-10-CM | POA: Diagnosis not present

## 2024-05-03 DIAGNOSIS — E538 Deficiency of other specified B group vitamins: Secondary | ICD-10-CM | POA: Insufficient documentation

## 2024-05-03 DIAGNOSIS — N1832 Chronic kidney disease, stage 3b: Secondary | ICD-10-CM | POA: Diagnosis not present

## 2024-05-03 DIAGNOSIS — I10 Essential (primary) hypertension: Secondary | ICD-10-CM

## 2024-05-03 LAB — URINALYSIS, ROUTINE W REFLEX MICROSCOPIC
Bilirubin Urine: NEGATIVE
Hgb urine dipstick: NEGATIVE
Ketones, ur: NEGATIVE
Leukocytes,Ua: NEGATIVE
Nitrite: NEGATIVE
RBC / HPF: NONE SEEN (ref 0–?)
Specific Gravity, Urine: 1.02 (ref 1.000–1.030)
Total Protein, Urine: NEGATIVE
Urine Glucose: NEGATIVE
Urobilinogen, UA: 0.2 (ref 0.0–1.0)
WBC, UA: NONE SEEN (ref 0–?)
pH: 6 (ref 5.0–8.0)

## 2024-05-03 LAB — HEPATIC FUNCTION PANEL
ALT: 26 U/L (ref 3–53)
AST: 29 U/L (ref 5–37)
Albumin: 4 g/dL (ref 3.5–5.2)
Alkaline Phosphatase: 92 U/L (ref 39–117)
Bilirubin, Direct: 0.1 mg/dL (ref 0.1–0.3)
Total Bilirubin: 0.7 mg/dL (ref 0.2–1.2)
Total Protein: 7.3 g/dL (ref 6.0–8.3)

## 2024-05-03 LAB — BASIC METABOLIC PANEL WITH GFR
BUN: 29 mg/dL — ABNORMAL HIGH (ref 6–23)
CO2: 27 meq/L (ref 19–32)
Calcium: 9.5 mg/dL (ref 8.4–10.5)
Chloride: 107 meq/L (ref 96–112)
Creatinine, Ser: 1.57 mg/dL — ABNORMAL HIGH (ref 0.40–1.50)
GFR: 39.35 mL/min — ABNORMAL LOW (ref >60.00)
Glucose, Bld: 110 mg/dL — ABNORMAL HIGH (ref 70–99)
Potassium: 4.1 meq/L (ref 3.5–5.1)
Sodium: 140 meq/L (ref 135–145)

## 2024-05-03 LAB — CBC WITH DIFFERENTIAL/PLATELET
Basophils Absolute: 0.1 K/uL (ref 0.0–0.1)
Basophils Relative: 1.1 % (ref 0.0–3.0)
Eosinophils Absolute: 0.3 K/uL (ref 0.0–0.7)
Eosinophils Relative: 6.1 % — ABNORMAL HIGH (ref 0.0–5.0)
HCT: 37.5 % — ABNORMAL LOW (ref 39.0–52.0)
Hemoglobin: 12.9 g/dL — ABNORMAL LOW (ref 13.0–17.0)
Lymphocytes Relative: 25.9 % (ref 12.0–46.0)
Lymphs Abs: 1.4 K/uL (ref 0.7–4.0)
MCHC: 34.5 g/dL (ref 30.0–36.0)
MCV: 96 fl (ref 78.0–100.0)
Monocytes Absolute: 0.6 K/uL (ref 0.1–1.0)
Monocytes Relative: 12 % (ref 3.0–12.0)
Neutro Abs: 2.9 K/uL (ref 1.4–7.7)
Neutrophils Relative %: 54.9 % (ref 43.0–77.0)
Platelets: 168 K/uL (ref 150.0–400.0)
RBC: 3.91 Mil/uL — ABNORMAL LOW (ref 4.22–5.81)
RDW: 14.6 % (ref 11.5–15.5)
WBC: 5.2 K/uL (ref 4.0–10.5)

## 2024-05-03 LAB — LIPID PANEL
Cholesterol: 134 mg/dL (ref 28–200)
HDL: 40.6 mg/dL (ref >39.00)
LDL Cholesterol: 52 mg/dL (ref 10–99)
NonHDL: 93.33
Total CHOL/HDL Ratio: 3
Triglycerides: 205 mg/dL — ABNORMAL HIGH (ref 10.0–149.0)
VLDL: 41 mg/dL — ABNORMAL HIGH (ref 0.0–40.0)

## 2024-05-03 LAB — IBC PANEL
Iron: 90 ug/dL (ref 42–165)
Saturation Ratios: 28.8 % (ref 20.0–50.0)
TIBC: 312.2 ug/dL (ref 250.0–450.0)
Transferrin: 223 mg/dL (ref 212.0–360.0)

## 2024-05-03 LAB — FERRITIN: Ferritin: 203.5 ng/mL (ref 22.0–322.0)

## 2024-05-03 LAB — HEMOGLOBIN A1C: Hgb A1c MFr Bld: 6 % (ref 4.6–6.5)

## 2024-05-03 LAB — TSH: TSH: 0.1 u[IU]/mL — ABNORMAL LOW (ref 0.35–5.50)

## 2024-05-03 MED ORDER — LEVOTHYROXINE SODIUM 112 MCG PO TABS: 112.0000 ug | ORAL_TABLET | Freq: Every day | ORAL | 3 refills | Status: AC

## 2024-05-03 NOTE — Assessment & Plan Note (Signed)
 Stable , cont inhaler prn

## 2024-05-03 NOTE — Patient Instructions (Addendum)
 Please take all new medication as prescribed - the zyrtec 10 mg per day  Please continue all other medications as before, and refills have been done if requested.  Please have the pharmacy call with any other refills you may need.  Please continue your efforts at being more active, low cholesterol diet, and weight control.  You are otherwise up to date with prevention measures today.  Please keep your appointments with your specialists as you may have planned - Dr Mona  Please go to the LAB at the blood drawing area for the tests to be done  You will be contacted by phone if any changes need to be made immediately.  Otherwise, you will receive a letter about your results with an explanation, but please check with MyChart first.  Please make an Appointment to return in 6 months, or sooner if needed

## 2024-05-03 NOTE — Assessment & Plan Note (Addendum)
 Lab Results  Component Value Date   CREATININE 1.57 (H) 05/03/2024   Stable overall, cont to avoid nephrotoxins [id number]

## 2024-05-03 NOTE — Assessment & Plan Note (Signed)
 Lab Results  Component Value Date   VITAMINB12 233 03/21/2024   Low, to start oral replacement - b12 1000 mcg qd
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
## 2024-05-03 NOTE — Assessment & Plan Note (Signed)
 Lab Results  Component Value Date   LDLCALC 52 05/03/2024   Stable, pt to continue current statin  - diet low chol

## 2024-05-03 NOTE — Assessment & Plan Note (Signed)
 No overt bleeding, for f/u iron lab and ferritin

## 2024-05-03 NOTE — Assessment & Plan Note (Signed)
 Mild to mod, for zyrtec 10 mg every day prn,,  to f/u any worsening symptoms or concerns

## 2024-05-03 NOTE — Assessment & Plan Note (Signed)
 Lab Results  Component Value Date   HGBA1C 6.0 05/03/2024   Stable, pt to continue current medical treatment  - diet, wt control

## 2024-05-03 NOTE — Assessment & Plan Note (Signed)
 BP Readings from Last 3 Encounters:  05/03/24 130/68  05/02/24 126/65  03/21/24 126/76   Stable, pt to continue medical treatment  - diet,wt control

## 2024-05-30 ENCOUNTER — Telehealth: Payer: Self-pay

## 2024-05-30 NOTE — Telephone Encounter (Signed)
 Copied from CRM [id number]. Topic: Appointments - Appointment Scheduling >> May 30, 2024  1:07 PM Darshell M wrote: Patient/patient representative is calling to schedule an appointment. Refer to attachments for appointment information.   Appointment has been made - 07/13/24

## 2024-06-06 ENCOUNTER — Ambulatory Visit

## 2024-06-13 ENCOUNTER — Ambulatory Visit: Admitting: Gastroenterology
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
## 2024-07-10 ENCOUNTER — Encounter

## 2024-07-13 ENCOUNTER — Encounter

## 2024-09-04 ENCOUNTER — Ambulatory Visit
